# Patient Record
Sex: Male | Born: 1945
Health system: Southern US, Community
[De-identification: ages and names within clinical notes are randomized; demographics above are authoritative.]

## PROBLEM LIST (undated history)

## (undated) DIAGNOSIS — F411 Generalized anxiety disorder: Secondary | ICD-10-CM

## (undated) DIAGNOSIS — T7840XA Allergy, unspecified, initial encounter: Secondary | ICD-10-CM

## (undated) DIAGNOSIS — I700 Atherosclerosis of aorta: Secondary | ICD-10-CM

## (undated) DIAGNOSIS — R011 Cardiac murmur, unspecified: Secondary | ICD-10-CM

## (undated) DIAGNOSIS — G709 Myoneural disorder, unspecified: Secondary | ICD-10-CM

## (undated) DIAGNOSIS — N200 Calculus of kidney: Secondary | ICD-10-CM

## (undated) DIAGNOSIS — I493 Ventricular premature depolarization: Secondary | ICD-10-CM

## (undated) DIAGNOSIS — M109 Gout, unspecified: Secondary | ICD-10-CM

## (undated) DIAGNOSIS — F329 Major depressive disorder, single episode, unspecified: Secondary | ICD-10-CM

## (undated) DIAGNOSIS — E785 Hyperlipidemia, unspecified: Secondary | ICD-10-CM

## (undated) DIAGNOSIS — C801 Malignant (primary) neoplasm, unspecified: Secondary | ICD-10-CM

## (undated) DIAGNOSIS — E119 Type 2 diabetes mellitus without complications: Secondary | ICD-10-CM

## (undated) DIAGNOSIS — K589 Irritable bowel syndrome without diarrhea: Secondary | ICD-10-CM

## (undated) DIAGNOSIS — H269 Unspecified cataract: Secondary | ICD-10-CM

## (undated) DIAGNOSIS — K5730 Diverticulosis of large intestine without perforation or abscess without bleeding: Secondary | ICD-10-CM

## (undated) DIAGNOSIS — F3289 Other specified depressive episodes: Secondary | ICD-10-CM

## (undated) DIAGNOSIS — D649 Anemia, unspecified: Secondary | ICD-10-CM

## (undated) DIAGNOSIS — M1990 Unspecified osteoarthritis, unspecified site: Secondary | ICD-10-CM

## (undated) DIAGNOSIS — I10 Essential (primary) hypertension: Secondary | ICD-10-CM

## (undated) DIAGNOSIS — K219 Gastro-esophageal reflux disease without esophagitis: Secondary | ICD-10-CM

## (undated) DIAGNOSIS — Z8546 Personal history of malignant neoplasm of prostate: Secondary | ICD-10-CM

## (undated) HISTORY — DX: Gastro-esophageal reflux disease without esophagitis: K21.9

## (undated) HISTORY — DX: Unspecified cataract: H26.9

## (undated) HISTORY — DX: Major depressive disorder, single episode, unspecified: F32.9

## (undated) HISTORY — DX: Hyperlipidemia, unspecified: E78.5

## (undated) HISTORY — PX: TONSILLECTOMY: SUR1361

## (undated) HISTORY — DX: Irritable bowel syndrome, unspecified: K58.9

## (undated) HISTORY — DX: Other specified depressive episodes: F32.89

## (undated) HISTORY — DX: Calculus of kidney: N20.0

## (undated) HISTORY — DX: Cardiac murmur, unspecified: R01.1

## (undated) HISTORY — DX: Allergy, unspecified, initial encounter: T78.40XA

## (undated) HISTORY — PX: PROSTATE SURGERY: SHX751

## (undated) HISTORY — DX: Unspecified osteoarthritis, unspecified site: M19.90

## (undated) HISTORY — DX: Type 2 diabetes mellitus without complications: E11.9

## (undated) HISTORY — DX: Myoneural disorder, unspecified: G70.9

## (undated) HISTORY — DX: Gout, unspecified: M10.9

## (undated) HISTORY — PX: COLONOSCOPY: SHX174

## (undated) HISTORY — DX: Atherosclerosis of aorta: I70.0

## (undated) HISTORY — PX: POLYPECTOMY: SHX149

## (undated) HISTORY — DX: Malignant (primary) neoplasm, unspecified: C80.1

## (undated) HISTORY — PX: OTHER SURGICAL HISTORY: SHX169

## (undated) HISTORY — DX: Generalized anxiety disorder: F41.1

## (undated) HISTORY — DX: Diverticulosis of large intestine without perforation or abscess without bleeding: K57.30

## (undated) HISTORY — DX: Anemia, unspecified: D64.9

## (undated) HISTORY — DX: Personal history of malignant neoplasm of prostate: Z85.46

## (undated) HISTORY — DX: Ventricular premature depolarization: I49.3

## (undated) HISTORY — DX: Essential (primary) hypertension: I10

## (undated) HISTORY — PX: HERNIA REPAIR: SHX51

---

## 2001-06-05 ENCOUNTER — Emergency Department (HOSPITAL_COMMUNITY): Admission: EM | Admit: 2001-06-05 | Discharge: 2001-06-05 | Payer: Self-pay | Admitting: Emergency Medicine

## 2001-06-05 ENCOUNTER — Encounter: Payer: Self-pay | Admitting: Emergency Medicine

## 2001-06-12 ENCOUNTER — Ambulatory Visit (HOSPITAL_BASED_OUTPATIENT_CLINIC_OR_DEPARTMENT_OTHER): Admission: RE | Admit: 2001-06-12 | Discharge: 2001-06-12 | Payer: Self-pay | Admitting: Urology

## 2001-07-10 ENCOUNTER — Encounter: Payer: Self-pay | Admitting: Urology

## 2001-07-10 ENCOUNTER — Encounter: Admission: RE | Admit: 2001-07-10 | Discharge: 2001-07-10 | Payer: Self-pay | Admitting: Urology

## 2001-08-14 ENCOUNTER — Encounter: Payer: Self-pay | Admitting: Urology

## 2001-08-14 ENCOUNTER — Encounter: Admission: RE | Admit: 2001-08-14 | Discharge: 2001-08-14 | Payer: Self-pay | Admitting: Urology

## 2002-12-12 ENCOUNTER — Encounter: Payer: Self-pay | Admitting: Gastroenterology

## 2003-10-31 ENCOUNTER — Inpatient Hospital Stay (HOSPITAL_COMMUNITY): Admission: RE | Admit: 2003-10-31 | Discharge: 2003-11-03 | Payer: Self-pay | Admitting: Urology

## 2003-10-31 ENCOUNTER — Encounter (INDEPENDENT_AMBULATORY_CARE_PROVIDER_SITE_OTHER): Payer: Self-pay | Admitting: Specialist

## 2008-01-16 ENCOUNTER — Telehealth: Payer: Self-pay | Admitting: Gastroenterology

## 2008-02-06 ENCOUNTER — Encounter: Payer: Self-pay | Admitting: Gastroenterology

## 2008-02-13 ENCOUNTER — Ambulatory Visit: Payer: Self-pay | Admitting: Gastroenterology

## 2008-02-21 ENCOUNTER — Encounter: Payer: Self-pay | Admitting: Gastroenterology

## 2008-02-26 ENCOUNTER — Telehealth: Payer: Self-pay | Admitting: Gastroenterology

## 2008-03-27 ENCOUNTER — Encounter: Payer: Self-pay | Admitting: Gastroenterology

## 2008-03-28 ENCOUNTER — Telehealth (INDEPENDENT_AMBULATORY_CARE_PROVIDER_SITE_OTHER): Payer: Self-pay | Admitting: *Deleted

## 2008-04-25 DIAGNOSIS — M109 Gout, unspecified: Secondary | ICD-10-CM | POA: Insufficient documentation

## 2008-04-25 DIAGNOSIS — E119 Type 2 diabetes mellitus without complications: Secondary | ICD-10-CM | POA: Insufficient documentation

## 2008-04-25 DIAGNOSIS — N200 Calculus of kidney: Secondary | ICD-10-CM | POA: Insufficient documentation

## 2008-04-25 DIAGNOSIS — K5730 Diverticulosis of large intestine without perforation or abscess without bleeding: Secondary | ICD-10-CM | POA: Insufficient documentation

## 2008-04-25 DIAGNOSIS — Z8546 Personal history of malignant neoplasm of prostate: Secondary | ICD-10-CM | POA: Insufficient documentation

## 2008-04-25 DIAGNOSIS — F329 Major depressive disorder, single episode, unspecified: Secondary | ICD-10-CM | POA: Insufficient documentation

## 2008-04-25 DIAGNOSIS — F411 Generalized anxiety disorder: Secondary | ICD-10-CM

## 2008-04-25 DIAGNOSIS — K589 Irritable bowel syndrome without diarrhea: Secondary | ICD-10-CM | POA: Insufficient documentation

## 2008-04-30 ENCOUNTER — Ambulatory Visit: Payer: Self-pay | Admitting: Gastroenterology

## 2008-04-30 LAB — CONVERTED CEMR LAB
BUN: 20 mg/dL (ref 6–23)
CO2: 34 meq/L — ABNORMAL HIGH (ref 19–32)
Calcium: 9.6 mg/dL (ref 8.4–10.5)
Chloride: 102 meq/L (ref 96–112)
Creatinine, Ser: 0.9 mg/dL (ref 0.4–1.5)
Ferritin: 37 ng/mL (ref 22.0–322.0)
Folate: >20 ng/mL
GFR calc Af Amer: 109 mL/min
GFR calc non Af Amer: 90 mL/min
Glucose, Bld: 91 mg/dL (ref 70–99)
Iron: 75 ug/dL (ref 42–165)
Potassium: 4.6 meq/L (ref 3.5–5.1)
Saturation Ratios: 20.7 % (ref 20.0–50.0)
Sodium: 141 meq/L (ref 135–145)
Transferrin: 258.8 mg/dL (ref >=212.0)
Vitamin B-12: 474 pg/mL (ref 211–911)

## 2008-05-01 ENCOUNTER — Ambulatory Visit: Payer: Self-pay | Admitting: Gastroenterology

## 2008-06-04 ENCOUNTER — Encounter: Payer: Self-pay | Admitting: Gastroenterology

## 2008-06-27 ENCOUNTER — Ambulatory Visit: Payer: Self-pay | Admitting: Gastroenterology

## 2008-06-27 DIAGNOSIS — D509 Iron deficiency anemia, unspecified: Secondary | ICD-10-CM | POA: Insufficient documentation

## 2008-06-27 LAB — CONVERTED CEMR LAB
Basophils Absolute: 0 10*3/uL (ref 0.0–0.1)
Basophils Relative: 0.1 % (ref 0.0–3.0)
Eosinophils Absolute: 0.1 10*3/uL (ref 0.0–0.7)
Eosinophils Relative: 1.5 % (ref 0.0–5.0)
Ferritin: 45.8 ng/mL (ref 22.0–322.0)
HCT: 36.5 % — ABNORMAL LOW (ref 39.0–52.0)
Hemoglobin: 12.8 g/dL — ABNORMAL LOW (ref 13.0–17.0)
INR: 1 (ref 0.8–1.0)
Iron: 90 ug/dL (ref 42–165)
Lymphocytes Relative: 17.1 % (ref 12.0–46.0)
MCHC: 35 g/dL (ref 30.0–36.0)
MCV: 97.7 fL (ref 78.0–100.0)
Monocytes Absolute: 0.4 10*3/uL (ref 0.1–1.0)
Monocytes Relative: 7.3 % (ref 3.0–12.0)
Neutro Abs: 3.6 10*3/uL (ref 1.4–7.7)
Neutrophils Relative %: 74 % (ref 43.0–77.0)
Platelets: 200 10*3/uL (ref 150–400)
Prothrombin Time: 11 s (ref 10.9–13.3)
RBC: 3.74 M/uL — ABNORMAL LOW (ref 4.22–5.81)
RDW: 12.5 % (ref 11.5–14.6)
Sed Rate: 18 mm/hr — ABNORMAL HIGH (ref 0–16)
Tissue Transglutaminase Ab, IgA: 0.3 units (ref <7)
WBC: 5 10*3/uL (ref 4.5–10.5)

## 2008-07-05 ENCOUNTER — Encounter: Payer: Self-pay | Admitting: Gastroenterology

## 2008-07-05 ENCOUNTER — Ambulatory Visit: Payer: Self-pay | Admitting: Gastroenterology

## 2008-07-05 DIAGNOSIS — K2900 Acute gastritis without bleeding: Secondary | ICD-10-CM | POA: Insufficient documentation

## 2008-07-08 ENCOUNTER — Telehealth: Payer: Self-pay | Admitting: Gastroenterology

## 2008-07-08 ENCOUNTER — Encounter: Payer: Self-pay | Admitting: Gastroenterology

## 2008-08-06 ENCOUNTER — Ambulatory Visit: Payer: Self-pay | Admitting: Gastroenterology

## 2008-08-06 ENCOUNTER — Encounter (INDEPENDENT_AMBULATORY_CARE_PROVIDER_SITE_OTHER): Payer: Self-pay | Admitting: *Deleted

## 2008-08-13 ENCOUNTER — Ambulatory Visit: Payer: Self-pay | Admitting: Gastroenterology

## 2008-08-14 DIAGNOSIS — K921 Melena: Secondary | ICD-10-CM | POA: Insufficient documentation

## 2008-08-14 LAB — CONVERTED CEMR LAB: Fecal Occult Bld: POSITIVE — AB

## 2008-08-22 ENCOUNTER — Telehealth: Payer: Self-pay | Admitting: Gastroenterology

## 2008-08-29 ENCOUNTER — Encounter: Payer: Self-pay | Admitting: Gastroenterology

## 2008-08-29 ENCOUNTER — Encounter: Payer: Self-pay | Admitting: Internal Medicine

## 2008-08-29 ENCOUNTER — Ambulatory Visit: Payer: Self-pay | Admitting: Gastroenterology

## 2008-10-09 ENCOUNTER — Encounter: Payer: Self-pay | Admitting: Gastroenterology

## 2008-10-28 ENCOUNTER — Telehealth: Payer: Self-pay | Admitting: Gastroenterology

## 2008-11-07 ENCOUNTER — Ambulatory Visit: Payer: Self-pay | Admitting: Gastroenterology

## 2008-11-07 LAB — CONVERTED CEMR LAB
Basophils Relative: 1 % (ref 0.0–3.0)
Eosinophils Relative: 3.1 % (ref 0.0–5.0)
HCT: 34.9 % — ABNORMAL LOW (ref 39.0–52.0)
Hemoglobin: 12.1 g/dL — ABNORMAL LOW (ref 13.0–17.0)
Lymphs Abs: 1.2 10*3/uL (ref 0.7–4.0)
MCV: 98.8 fL (ref 78.0–100.0)
Monocytes Absolute: 0.5 10*3/uL (ref 0.1–1.0)
Monocytes Relative: 11.3 % (ref 3.0–12.0)
RBC: 3.53 M/uL — ABNORMAL LOW (ref 4.22–5.81)
WBC: 4.7 10*3/uL (ref 4.5–10.5)

## 2009-01-15 ENCOUNTER — Encounter: Payer: Self-pay | Admitting: Gastroenterology

## 2009-03-06 ENCOUNTER — Ambulatory Visit: Payer: Self-pay | Admitting: Gastroenterology

## 2009-03-27 ENCOUNTER — Telehealth: Payer: Self-pay | Admitting: Gastroenterology

## 2009-03-28 ENCOUNTER — Ambulatory Visit: Payer: Self-pay | Admitting: Gastroenterology

## 2009-04-10 ENCOUNTER — Telehealth (INDEPENDENT_AMBULATORY_CARE_PROVIDER_SITE_OTHER): Payer: Self-pay | Admitting: *Deleted

## 2009-08-01 ENCOUNTER — Encounter: Admission: RE | Admit: 2009-08-01 | Discharge: 2009-08-01 | Payer: Self-pay | Admitting: Family Medicine

## 2009-11-14 ENCOUNTER — Telehealth: Payer: Self-pay | Admitting: Gastroenterology

## 2010-03-27 ENCOUNTER — Encounter: Payer: Self-pay | Admitting: Gastroenterology

## 2010-05-06 ENCOUNTER — Encounter: Payer: Self-pay | Admitting: Gastroenterology

## 2010-07-27 ENCOUNTER — Encounter: Payer: Self-pay | Admitting: Gastroenterology

## 2010-08-18 NOTE — Progress Notes (Signed)
Summary: Refill request  Phone Note From Pharmacy   Caller: [facility] Summary of Call: Rx refill requested for Dualvit. Initial call taken by: Ashok Cordia RN,  November 14, 2009 4:36 PM    Prescriptions: RE DUALVIT PLUS 162-115.2-1 MG CAPS (FEFUM-FEPO-FA-B CMP-C-ZN-MN-CU) Take 1 tablet dialy  #30 x 6   Entered by:   Ashok Cordia RN   Authorized by:   Mardella Layman MD [facility]   Signed by:   Ashok Cordia RN on 11/14/2009   Method used:   Electronically to        [facility] (retail)       [address]       [place], [place]  [address]       Ph: [phone]       Fax: [phone]   RxID:   [id number]

## 2010-11-30 ENCOUNTER — Encounter: Payer: Self-pay | Admitting: Gastroenterology

## 2010-12-04 NOTE — Op Note (Signed)
[place]  Patient:    George Wang, George Wang Visit Number: [id number] MRN: [id number]          Service Type: NES Location: NESC Attending Physician:  Laqueta Jean Dictated by:   Vonzell Schlatter Patsi Sears, M.D. Proc. Date: 06/12/01 Admit Date:  06/12/2001                             Operative Report  PREOPERATIVE DIAGNOSIS:  Left lower ureteral calculus.  POSTOPERATIVE DIAGNOSIS:  Past left lower ureteral calculus.  OPERATION:  Cystourethroscopy, left retrograde pyelogram, left balloon dilation of the lower ureter and left ureteroscopy.  SURGEON:  Sigmund I. Patsi Sears, M.D.  ANESTHESIA:  General (LMA).  DESCRIPTION OF PROCEDURE:  After appropriate preanesthesia, the patient was brought to the operating room and placed on the operating room table in the dorsal supine position where general anesthesia was introduced.  He was then replaced in the dorsal lithotomy position.  The pubis was prepped with Betadine solution and draped in the usual fashion.  Review of the history showed that this patient had a 3 mm left lower ureteral vesicle junction stone by CT scan last week.  He had pain on Saturday night and now presents for a cystoscopy and manipulation of the stone.  DESCRIPTION OF PROCEDURE:  Cystoscopy was accomplished which shows a normal-appearing bladder and edematous orifice.  The open ended catheter could not be passed, and a guidewire was passed in.  Then the open ended catheter was passed and retrograde pyelogram reveals grossly edematous left ureteral orifice.  No stone was identified.  Ureteroscopy was accomplished all the way to the level of the kidney but no stone could be identified.  Multiple attempts a __________ a stone basket was accomplished, but no stone was identified.  Therefore, Xylocaine jelly was left in the ureter, and Xylocaine left in the urethra.  The patient was given IV Toradol, and was given a  B&O suppository at the end of the case.  The patient was awakened and taken to the recovery room in good condition. Dictated by:   Vonzell Schlatter Patsi Sears, M.D. Attending Physician:  Laqueta Jean DD:  06/12/01 TD:  06/12/01 Job: [id number]

## 2010-12-04 NOTE — Discharge Summary (Signed)
NAME:  George Wang, George Wang NO.:  [id number]   MEDICAL RECORD NO.:  [id number]                   PATIENT TYPE:  INP   LOCATION:  0377                                 FACILITY:  [facility]   PHYSICIAN:  Excell Seltzer. Annabell Howells, M.D.                 DATE OF BIRTH:  March 20, 1946   DATE OF ADMISSION:  10/31/2003  DATE OF DISCHARGE:  11/03/2003                                 DISCHARGE SUMMARY   ADMISSION DIAGNOSIS:  Prostate cancer.   PROCEDURE:  Radical retropubic prostatectomy with bilateral pelvic lymph  node dissection performed November 01, 2003.   DISCHARGE MEDICATIONS:  Vicodin, Levaquin.   DISCHARGE DIET:  Regular.   ACTIVITY LEVEL:  The patient should avoid bending, stooping, lifting, or  performing other strenuous activities.   FOLLOWUP:  The patient is to return to see Dr. Annabell Howells in one week.   HISTORY OF PRESENT ILLNESS:  The patient is a 65 year old white male who was  found to have a prostatic nodule with a PSA of 0.7. The patient underwent a  biopsy which revealed Gleason's 6 carcinoma of the prostate and he was  elected to undergo a radical retropubic prostatectomy.   HOSPITAL COURSE:  [name] was admitted to [facility] on October 31, 2003, was taken to the operating room at  which time he underwent a radical retropubic prostatectomy with bilateral  pelvic lymph node dissection. The patient tolerated the procedure well and  postoperatively transferred to the PACU in stable condition.  For a detailed  description of the operating please see the type operative note on the  chart. On postoperative day #1, the patient remained afebrile and relatively  stable with clear urine coming from his Foley catheter. The patient's  hemoglobin was 9.4 on postoperative day one, which was down from 13.5  preoperatively, but the patient was stable. The patient's diet was  subsequently advanced and he was encouraged to ambulate in the  hallway. On  postoperative day two, the patient remained hemodynamically stable and a  followup hemoglobin was 9.2. The patient was otherwise doing well and was  monitored for another 24 hours, and on postoperative day three was ready for  discharge. The patient's JP drain was subsequently discharged home with  Foley catheter to light drainage. He is to follow up with Dr. Annabell Howells in  approximately one week.     Bailey Mech, MD                        Excell Seltzer. Annabell Howells, M.D.    JP/MEDQ  D:  12/03/2003  T:  12/03/2003  Job:  [id number]

## 2010-12-04 NOTE — Op Note (Signed)
NAME:  George Wang, George Wang NO.:  [id number]   MEDICAL RECORD NO.:  [id number]                   PATIENT TYPE:  INP   LOCATION:  0377                                 FACILITY[facility]  [facility]   PHYSICIAN:  Excell Seltzer. Annabell Howells, M.D.                 DATE OF BIRTH:  1945-12-22   DATE OF PROCEDURE:  11/01/2003  DATE OF DISCHARGE:                                 OPERATIVE REPORT   PREOPERATIVE DIAGNOSES:  Prostate cancer.   POSTOPERATIVE DIAGNOSES:  Prostate cancer.   PROCEDURE:  Nerve sparing radical retropubic prostatectomy with pelvic  lymphadenectomy.   SURGEON:  Margie Brink. Annabell Howells, M.D.   ASSISTANT:  Thyra Breed, M.D.   ANESTHESIA:  General endotracheal.   DRAINS:  22 French Foley catheter to straight drain and a 10 [place] Blake  drain to bulb suction.   ESTIMATED BLOOD LOSS:  400 mL   COMPLICATIONS:  None.   INDICATIONS FOR PROCEDURE:  Mr. Reidel is a 65 year old male who  originally presented with normal PSA value of 0.7 but a palpable prostate  nodule found during a workup for hematuria.  Subsequently the patient  underwent a transrectal ultrasound of the prostate, biopsy which revealed a  Gleason 3+3=6 focus of prostate adenocarcinoma on the left needle biopsy.  Of note, the patient's nodule was on the right.  His clinical stage is T2A.  Several modes of therapy for following and treating his prostate cancer were  discussed. In particular, watchful waiting as well as radical prostatectomy,  radiation therapy including external beam and seed implantation were  discussed in great detail.  The patient has decided to proceed with radical  retropubic prostatectomy.  The risks, benefits, and alternatives to this  procedure have been discussed with the patient and he is willing to proceed.  Of additional note, the patient did undergo a Cardiolite study prior to  surgical consideration which showed no evidence of ischemia.  Informed  consent has been obtained to  undergo radical retropubic prostatectomy with  bilateral pelvic lymphadenectomy.   DESCRIPTION OF PROCEDURE:  Following identification by his arm bracelet, the  patient was brought to the operating room and placed in the supine position.  A small bump was then placed in the patient's lumbar region.  He then  underwent general endotracheal anesthesia and was given preoperative IV  antibiotics.  The lower abdomen was then shaved and prepped with Betadine  solution and draped in the usual sterile fashion. A 20 French Foley catheter  was then inserted into the bladder and the bladder was drained in its  entirety.   A lower midline incision was then made with the scalpel from the pubis to  just below the umbilicus. The scalpel was then used to carry the incision  down through the subcutaneous tissues as well as the anterior rectus fascia.  Any subcutaneous bleeding was then controlled with Bovie electrocautery.  The rectus  muscle was then identified and parted in the midline. The  transversalis fascia was opened and blunt dissection was used to expose both  the right and the left pelvic fossa.  The right pelvic fossa was then  exposed with malleable retractors using the Bookwalter self retaining unit  and dissection of the right sided lymph node packed was initiated over the  iliac vein.  During the dissection, there was no evidence of gross nodal  disease. The lymph node dissection was then completed with the limits of the  dissection including the external iliac vein, the obturator nerve, the  circumflex iliac vein and the bifurcation of the iliac artery.  Large Hem-o-  Lok  clips were then used to control any vascular and lymphatic channels.  The obturator nerve was kept in plain site and protected throughout the  dissection.  Again no gross obvious nodal disease was noted.  A small pack  was then placed within the right pelvic fossa to later be removed. We then  reset the malleable  retractor and turned our attention to the left node  dissection which was performed in an identical fashion without event. Again  large clips were used for control of any lymphatic and vascular channels.  The inferior portion of both node packets was controlled with a 2-0 silk  tie.  Once the node dissections were complete, we began our dissection of  the prostate.  The retractor blades were then readjusted. The endopelvic  fascia was identified and punctured on the right. Blunt dissection was then  used to free the lateral aspect of the prostate.  Any remaining endopelvic  fascia was then released using a right angled clamp and Bovie  electrocautery.  Care was taken to remain far away from the neurovascular  bundles.  This procedure was then repeated on the left side.  The fascia was  then incised superiorly as it reflected over the prostate.  The fat  overlying the dorsal venous complex was then teased away with a Kitner and  sucker dissection to narrow the area.  The puboprostatic ligaments were then  taken down at their extreme lateral attachments to the pubis.  The majority  within the midline were left intact.  The Hohenfelner clamp was then passed  beneath the dorsal venous complex.  A #1 Vicryl tie was passed and tied  around the dorsal venous complex.  An Allis clamp was then used to grasp the  edges of the endopelvic fascia and approximate them over the prostate.  A 2-  0 Vicryl suture on a UR-5 needle was then used in a figure-of-eight fashion  to deeply control any potential back bleeding vessels. We then divided the  dorsal venous complex exposing the apex of the prostate and the urethra.  A  Vanderbilt was then used to carefully dissect the neurovascular bundles from  the urethra laterally on each side.  Metzenbaum scissors were then used to  incise the anterior urethra and expose the Foley catheter below.  A right  angled clamp was then passed beneath the Foley catheter. The  Hohenfelner clamp was then again passed below the urethra and a moistened umbilical tape  passed to hold this orientation.  The Foley catheter was then lubricated and  a right angled clamp was used to deliver the Foley catheter and pull the  catheter into the wound.  The catheter was then used to provide cephalad  traction on the prostate for further dissection.  We then divided the  posterior urethra and all rectourethralis attachments were taken down  bluntly.  The prostate was then carefully dissected from the rectum bluntly.  This allowed excellent exposure of the lateral pedicles. These were then  taken down in successive fashion using right angled dissection and large  right angled Hem-o-Lok  clips.  Once the prostate had been sufficiently  elevated, the anterior leaf of Denonvillier's fascia was incised overlying  the seminal vesicles.  Metzenbaum scissors were then used to carefully  dissect out the seminal vesicles and ampulla of the vas.  Initially both  ampulla of the vas were ligated using large Hem-o-Lok  clips and then  transected.  The seminal vesicles were then ligated using large clips and  transected.   We then turned our attention anteriorly where the bladder neck was grasped  between two Allis clamps. Using Bovie electrocautery and a tonsil clamp, the  bladder neck fibers were then carefully dissected out.  Once the anterior  bladder neck was divided, the Foley catheter balloon was deflated and the  Foley catheter was brought from the bladder and used to provide traction on  the prostate.  Both ureteral orifices were easily identified and well away  from the bladder neck and they were seen to be effluxing blue urine from an  earlier administration of indigo carmine.  The posterior bladder neck was  then divided along with any remaining posterior prostatic attachments.  The  prostatic specimen was then removed from the operative field and sent for  pathological  analysis.  There appeared to be no gross disease extension in  the specimen.  Initial inspection of the bladder neck showed an already  continent appearing bladder neck which would need minimal reconstruction.  The bladder neck mucosa was then everted anteriorly using interrupted 4-0  chromic suture.  Beneath the bladder neck, the overlying tissue was  reapproximated around the bladder using two interrupted 2-0 chromic sutures.  The bladder neck was then seen to be large enough to admit the tip of the  fifth finger of the surgeon.  The wound was then copiously irrigated and any  active bleeding was controlled using Bovie electrocautery and small Hem-o-  Lok  clips.  At this point, the [facility] device was passed per urethra and  seen to exit a well formed urethral stump. We then placed our anastomotic  sutures using 2-0 Vicryl from outside to in on the urethral stump at the 2,  5, 7 and 10 o'clock locations.  These sutures were then placed in a similar fashion to their corresponding areas on the bladder neck from inside to out.  A new Foley catheter was then placed through the bladder neck and guided  into the bladder with DeBakey forceps.  The balloon was then inflated with  15 mL of sterile fluid. We then placed the 12 o'clock anastomotic suture  from the urethral stump to the 12 o'clock location of the bladder neck.   The wound was again copiously irrigated and some oozing was noted in both  areas of the right and left pelvic fossa. Surgicel was used to pack these  areas and the malleable retractor removed. The bladder neck was then  securely positioned against the urethral stump removing all slack from the  anastomotic sutures.  The sutures were then tied and trimmed. The bladder  was irrigated and urine was found to be pink tinged. The anastomosis  appeared to be water tight.  We then placed a #10 flat fully fluted Harrison Mons  drain through a separate stab wound lateral to the left side of  the  abdominal incision.  This was positioned over the anastomotic area in the  right and left pelvic fossa.  The fascia was then closed with a running #1  PDS. The subcutaneous tissues were irrigated with sterile saline and the  skin was closed with surgical clips.  The Foley catheter was irrigated once  again and no bleeding was noted.  The catheter was placed to straight  drainage and the JP drain to bulb suction. All sponge, needle and instrument  counts were correct x2.  The patient tolerated the procedure well and there  were no complications.  Please note that Dr. Annabell Howells was present and  participated in the entire procedure as he was the responsible surgeon   DISPOSITION:  After awaking from general anesthesia, the patient was  transported to the post anesthesia care unit in stable condition.  From here  he will be transferred to the floor for further postoperative management.     Thyra Breed, MD                            Excell Seltzer. Annabell Howells, M.D.    EG/MEDQ  D:  11/01/2003  T:  11/01/2003  Job:  [id number]

## 2010-12-04 NOTE — H&P (Signed)
NAME:  George Wang, George Wang NO.:  [id number]   MEDICAL RECORD NO.:  [id number]                   PATIENT TYPE:  INP   LOCATION:  0377                                 FACILITY:  [facility]   PHYSICIAN:  Excell Seltzer. Annabell Howells, M.D.                 DATE OF BIRTH:  1945-11-18   DATE OF ADMISSION:  10/31/2003  DATE OF DISCHARGE:  11/03/2003                                HISTORY & PHYSICAL   CHIEF COMPLAINT:  Prostate cancer.   HISTORY:  George Wang is a 65 year old white male, who was found to have a  prostate nodule with a PSA of 0.7.  He underwent a biopsy which revealed a  Gleason 6 adenocarcinoma of the prostate, and he has elected radical  prostatectomy for therapy and is admitted for that procedure.   PAST MEDICAL HISTORY:  No drug allergies.   ADMISSION MEDICATIONS:  1. Lotrel 10/20 daily.  2. Diovan 320 mg b.i.d.  3. Zocor 40 mg nightly.  4. Metformin 500 mg 2 tabs daily.  5. Aspirin 81 mg daily.  6. Citrucel.  7. Amaryl 2 mg 2 tabs daily.  8. Hydrochlorothiazide 25 mg daily.  9. Avandia 8 mg 1/2 tab b.i.d.  10.      Niaspan 1 g daily.  11.      Folbee 2 tabs daily.  12.      Betacarotene.  13.      Zinc.  14.      Selenium.  15.      Omega fish oil.  16.      Centrum Silver.  17.      Meridia 10 mg daily.   MEDICAL HISTORY:  1. Diabetes mellitus.  2. Hypertension.  3. Dyslipidemia.  4. History of stones.  5. History of left herniorrhaphy.  6. Colon polyps with prior colonoscopy.   SOCIAL HISTORY:  No tobacco since 1980.  There is occasional alcohol.  Continues to work as a Psychologist, forensic.   FAMILY HISTORY:  Pertinent for heart disease.   REVIEW OF SYSTEMS:  He is otherwise without complaints.   PHYSICAL EXAMINATION:  GENERAL:  He is a well-developed, well-nourished  white male in no acute distress.  VITAL SIGNS:  Blood pressure 126/64, pulse 74, weight 202.  HEENT:  Head is normocephalic, atraumatic.  NECK:  Supple.  LUNGS:   Clear, normal __________.  HEART:  Regular rate and rhythm.  ABDOMEN:  Soft, moderately obese, nontender without masses,  hepatosplenomegaly, or CVA tenderness.  No hernias or inguinal adenopathy  are noted.  GU:  Unremarkable phallus with an adequate meatus.  Scrotum is unremarkable.  Testicles bilaterally descended, normal in size and consistency without mass  or tenderness.  Epididymis unremarkable.  Anus and perineum without lesions.  RECTAL:  Normal sphincter tone.  Prostate is 2+ in size with a small right-  sided, gritty nodule at the base.  Seminal vesicles  nonpalpable.  No rectal  masses are noted.   IMPRESSION:  Gleason 6, T1C/T2A adenocarcinoma of the prostate with  secondary diagnoses of hypertension, dyslipidemia, and diabetes mellitus.   PLAN:  He is admitted for a radical prostatectomy.                                               Excell Seltzer. Annabell Howells, M.D.    JJW/MEDQ  D:  11/12/2003  T:  11/12/2003  Job:  [id number]   cc:   Ernestina Penna, M.D.  [address] [place]  [place] [address]  Fax: [phone]

## 2011-01-27 ENCOUNTER — Other Ambulatory Visit: Payer: Self-pay

## 2011-04-22 LAB — GLUCOSE, CAPILLARY: Glucose-Capillary: 103 mg/dL — ABNORMAL HIGH (ref 70–99)

## 2011-09-01 DIAGNOSIS — E559 Vitamin D deficiency, unspecified: Secondary | ICD-10-CM | POA: Diagnosis not present

## 2011-09-01 DIAGNOSIS — I10 Essential (primary) hypertension: Secondary | ICD-10-CM | POA: Diagnosis not present

## 2011-09-01 DIAGNOSIS — E785 Hyperlipidemia, unspecified: Secondary | ICD-10-CM | POA: Diagnosis not present

## 2011-09-02 ENCOUNTER — Other Ambulatory Visit: Payer: Self-pay | Admitting: Family Medicine

## 2011-09-02 DIAGNOSIS — M47812 Spondylosis without myelopathy or radiculopathy, cervical region: Secondary | ICD-10-CM

## 2011-09-03 ENCOUNTER — Other Ambulatory Visit: Payer: Self-pay

## 2011-09-07 DIAGNOSIS — R7989 Other specified abnormal findings of blood chemistry: Secondary | ICD-10-CM | POA: Diagnosis not present

## 2011-09-10 ENCOUNTER — Ambulatory Visit
Admission: RE | Admit: 2011-09-10 | Discharge: 2011-09-10 | Disposition: A | Discharge status: Home / Self Care | Payer: Medicare Other | Source: Ambulatory Visit | Attending: Family Medicine | Admitting: Family Medicine

## 2011-09-10 ENCOUNTER — Other Ambulatory Visit: Payer: Self-pay

## 2011-09-10 DIAGNOSIS — M5030 Other cervical disc degeneration, unspecified cervical region: Secondary | ICD-10-CM | POA: Diagnosis not present

## 2011-09-10 DIAGNOSIS — M47812 Spondylosis without myelopathy or radiculopathy, cervical region: Secondary | ICD-10-CM | POA: Diagnosis not present

## 2011-09-10 DIAGNOSIS — M5020 Other cervical disc displacement, unspecified cervical region: Secondary | ICD-10-CM | POA: Diagnosis not present

## 2011-09-13 ENCOUNTER — Encounter: Payer: Self-pay | Admitting: Cardiology

## 2011-09-20 DIAGNOSIS — Z1212 Encounter for screening for malignant neoplasm of rectum: Secondary | ICD-10-CM | POA: Diagnosis not present

## 2011-09-22 ENCOUNTER — Encounter: Payer: Self-pay | Admitting: Cardiology

## 2011-10-07 DIAGNOSIS — R209 Unspecified disturbances of skin sensation: Secondary | ICD-10-CM | POA: Diagnosis not present

## 2011-10-07 DIAGNOSIS — G5600 Carpal tunnel syndrome, unspecified upper limb: Secondary | ICD-10-CM | POA: Diagnosis not present

## 2011-10-12 ENCOUNTER — Telehealth: Payer: Self-pay | Admitting: Cardiology

## 2011-10-13 ENCOUNTER — Encounter: Payer: Self-pay | Admitting: Cardiology

## 2011-10-13 ENCOUNTER — Ambulatory Visit (INDEPENDENT_AMBULATORY_CARE_PROVIDER_SITE_OTHER): Payer: Medicare Other | Admitting: Cardiology

## 2011-10-13 VITALS — BP 160/70 | HR 74 | Ht 65.0 in | Wt 208.0 lb

## 2011-10-13 DIAGNOSIS — I4949 Other premature depolarization: Secondary | ICD-10-CM | POA: Diagnosis not present

## 2011-10-13 DIAGNOSIS — I10 Essential (primary) hypertension: Secondary | ICD-10-CM | POA: Insufficient documentation

## 2011-10-13 DIAGNOSIS — I493 Ventricular premature depolarization: Secondary | ICD-10-CM

## 2011-10-13 DIAGNOSIS — E663 Overweight: Secondary | ICD-10-CM

## 2011-10-13 NOTE — Assessment & Plan Note (Signed)
The patient understands the need to lose weight with diet and exercise. We have discussed specific strategies for this.  

## 2011-10-13 NOTE — Patient Instructions (Addendum)
The current medical regimen is effective;  continue present plan and medications.  Your physician has requested that you have an echocardiogram. Echocardiography is a painless test that uses sound waves to create images of your heart. It provides your doctor with information about the size and shape of your heart and how well your heart's chambers and valves are working. This procedure takes approximately one hour. There are no restrictions for this procedure.  Follow up will be based on results.

## 2011-10-13 NOTE — Assessment & Plan Note (Signed)
I will check an echocardiogram. Provided this is normal no further evaluation will be directed. I would suggest thyroid studies with his next lab work. He denied discussed symptomatic treatment of these. Right now they are not particularly bothersome. Therefore, we will not pursue a change in therapy. If they become worse we will consider this.

## 2011-10-13 NOTE — Progress Notes (Signed)
HPI The patient presents for evaluation of palpitations. He has no past cardiac history. He reports a negative stress perfusion study several years ago. He's had a negative exercise treadmill test in the past. He has had PVCs. I reviewed her recent event monitor demonstrating this. He feels these palpitations. He feels them particularly at night when he is lying down. He does not describe associated presyncope or syncope. He does not describe chest pressure, neck or arm discomfort. He does not have significant shortness of breath, PND or orthopnea. He cannot bring along and he actually thinks they're much less when they were recently.  No Known Allergies  Current Outpatient Prescriptions  Medication Sig Dispense Refill  . amLODipine-benazepril (LOTREL) 10-20 MG per capsule Take 1 capsule by mouth daily.      Marland Kitchen atorvastatin (LIPITOR) 40 MG tablet Take 40 mg by mouth daily.      . benazepril (LOTENSIN) 20 MG tablet Take 20 mg by mouth daily.      . beta carotene 78295 UNIT capsule Take 25,000 Units by mouth daily.      . cholecalciferol (VITAMIN D) 400 UNITS TABS Take by mouth daily. 2 tab in am      . FeFum-FePo-FA-B Cmp-C-Zn-Mn-Cu (RE DUALVIT PLUS) 162-115.2-1 MG CAPS Take by mouth daily.      . fish oil-omega-3 fatty acids 1000 MG capsule Take 2 g by mouth daily.      Marland Kitchen glimepiride (AMARYL) 4 MG tablet Take 4 mg by mouth 2 (two) times daily. 1/2 tab bid      . losartan-hydrochlorothiazide (HYZAAR) 100-25 MG per tablet Take 1 tablet by mouth daily.      . metFORMIN (GLUCOPHAGE) 500 MG tablet Take 500 mg by mouth 2 (two) times daily with a meal.      . Multiple Vitamins-Minerals (CENTRUM SILVER PO) Take by mouth.      Marland Kitchen omeprazole (PRILOSEC) 20 MG capsule Take 20 mg by mouth daily.      Marland Kitchen PARoxetine (PAXIL) 20 MG tablet Take 20 mg by mouth every morning.      . pioglitazone (ACTOS) 30 MG tablet Take 30 mg by mouth daily.        Past Medical History  Diagnosis Date  . Personal history of  malignant neoplasm of prostate   . Gout, unspecified   . Depressive disorder, not elsewhere classified   . Anxiety state, unspecified   . Calculus of kidney   . Type II or unspecified type diabetes mellitus without mention of complication, not stated as uncontrolled   . Irritable bowel syndrome   . Diverticulosis of colon (without mention of hemorrhage)     Past Surgical History  Procedure Date  . Hernia repair     left side  . Prostate surgery   . Tonsillectomy     Family History  Problem Relation Age of Onset  . Cancer Maternal Aunt     breast  . Pancreatic cancer Paternal Aunt   . Prostate cancer Paternal Uncle   . Heart disease Maternal Grandmother   . Heart disease Maternal Grandfather   . Heart disease Paternal Grandfather     History   Social History  . Marital Status: Married    Spouse Name: N/A    Number of Children: N/A  . Years of Education: N/A   Occupational History  . Not on file.   Social History Main Topics  . Smoking status: Former Smoker    Quit date: 10/13/1978  . Smokeless  tobacco: Not on file  . Alcohol Use: Yes  . Drug Use: No  . Sexually Active: Not on file   Other Topics Concern  . Not on file   Social History Narrative  . No narrative on file    ROS:  Positive for  headaches, palpitations, reflux. Otherwise as stated in the history of present illness and negative for all other systems  PHYSICAL EXAM BP 160/70  Pulse 74  Ht 5\' 5"  (1.651 m)  Wt 208 lb (94.348 kg)  BMI 34.61 kg/m2 GENERAL:  Well appearing HEENT:  Pupils equal round and reactive, fundi not visualized, oral mucosa unremarkable NECK:  No jugular venous distention, waveform within normal limits, carotid upstroke brisk and symmetric, no bruits, no thyromegaly LYMPHATICS:  No cervical, inguinal adenopathy LUNGS:  Clear to auscultation bilaterally BACK:  No CVA tenderness CHEST:  Unremarkable HEART:  PMI not displaced or sustained,S1 and S2 within normal limits, no  S3, no S4, no clicks, no rubs, no murmurs ABD:  Flat, positive bowel sounds normal in frequency in pitch, no bruits, no rebound, no guarding, no midline pulsatile mass, no hepatomegaly, no splenomegaly, obese EXT:  2 plus pulses throughout, no edema, no cyanosis no clubbing SKIN:  No rashes no nodules NEURO:  Cranial nerves II through XII grossly intact, motor grossly intact throughout PSYCH:  Cognitively intact, oriented to person place and time  EKG:  Sinus rhythm, rate 74, limb lead reversal, low voltage in limb leads, no acute ST-T wave changes.10/13/2011    ASSESSMENT AND PLAN

## 2011-10-13 NOTE — Assessment & Plan Note (Signed)
His blood pressure is elevated today. However, he is not typically. I reviewed recent readings. No change in therapy is indicated.

## 2011-10-28 ENCOUNTER — Other Ambulatory Visit: Payer: Self-pay

## 2011-10-28 ENCOUNTER — Ambulatory Visit (HOSPITAL_COMMUNITY): Payer: Medicare Other | Attending: Cardiology

## 2011-10-28 DIAGNOSIS — R002 Palpitations: Secondary | ICD-10-CM | POA: Insufficient documentation

## 2011-10-28 DIAGNOSIS — I493 Ventricular premature depolarization: Secondary | ICD-10-CM

## 2011-10-28 DIAGNOSIS — I369 Nonrheumatic tricuspid valve disorder, unspecified: Secondary | ICD-10-CM | POA: Diagnosis not present

## 2011-10-28 DIAGNOSIS — E119 Type 2 diabetes mellitus without complications: Secondary | ICD-10-CM | POA: Diagnosis not present

## 2011-10-28 DIAGNOSIS — M4712 Other spondylosis with myelopathy, cervical region: Secondary | ICD-10-CM | POA: Diagnosis not present

## 2011-11-01 ENCOUNTER — Other Ambulatory Visit: Payer: Self-pay | Admitting: Neurosurgery

## 2011-11-01 DIAGNOSIS — M545 Low back pain: Secondary | ICD-10-CM

## 2011-11-02 NOTE — Telephone Encounter (Signed)
ENCOUNTER COMPLETED 

## 2011-11-05 ENCOUNTER — Ambulatory Visit
Admission: RE | Admit: 2011-11-05 | Discharge: 2011-11-05 | Disposition: A | Discharge status: Home / Self Care | Payer: Medicare Other | Source: Ambulatory Visit | Attending: Neurosurgery | Admitting: Neurosurgery

## 2011-11-05 DIAGNOSIS — M5126 Other intervertebral disc displacement, lumbar region: Secondary | ICD-10-CM | POA: Diagnosis not present

## 2011-11-05 DIAGNOSIS — M545 Low back pain: Secondary | ICD-10-CM

## 2011-11-05 DIAGNOSIS — M47817 Spondylosis without myelopathy or radiculopathy, lumbosacral region: Secondary | ICD-10-CM | POA: Diagnosis not present

## 2011-11-05 DIAGNOSIS — M5137 Other intervertebral disc degeneration, lumbosacral region: Secondary | ICD-10-CM | POA: Diagnosis not present

## 2011-12-02 DIAGNOSIS — E559 Vitamin D deficiency, unspecified: Secondary | ICD-10-CM | POA: Diagnosis not present

## 2011-12-02 DIAGNOSIS — E785 Hyperlipidemia, unspecified: Secondary | ICD-10-CM | POA: Diagnosis not present

## 2011-12-02 DIAGNOSIS — R7989 Other specified abnormal findings of blood chemistry: Secondary | ICD-10-CM | POA: Diagnosis not present

## 2011-12-02 DIAGNOSIS — E119 Type 2 diabetes mellitus without complications: Secondary | ICD-10-CM | POA: Diagnosis not present

## 2011-12-02 DIAGNOSIS — I10 Essential (primary) hypertension: Secondary | ICD-10-CM | POA: Diagnosis not present

## 2011-12-02 DIAGNOSIS — E039 Hypothyroidism, unspecified: Secondary | ICD-10-CM | POA: Diagnosis not present

## 2011-12-06 DIAGNOSIS — Z85828 Personal history of other malignant neoplasm of skin: Secondary | ICD-10-CM | POA: Diagnosis not present

## 2011-12-06 DIAGNOSIS — L570 Actinic keratosis: Secondary | ICD-10-CM | POA: Diagnosis not present

## 2011-12-09 DIAGNOSIS — M48061 Spinal stenosis, lumbar region without neurogenic claudication: Secondary | ICD-10-CM | POA: Diagnosis not present

## 2011-12-09 DIAGNOSIS — M4802 Spinal stenosis, cervical region: Secondary | ICD-10-CM | POA: Diagnosis not present

## 2011-12-24 DIAGNOSIS — Z1212 Encounter for screening for malignant neoplasm of rectum: Secondary | ICD-10-CM | POA: Diagnosis not present

## 2012-03-01 DIAGNOSIS — E119 Type 2 diabetes mellitus without complications: Secondary | ICD-10-CM | POA: Diagnosis not present

## 2012-03-01 DIAGNOSIS — J069 Acute upper respiratory infection, unspecified: Secondary | ICD-10-CM | POA: Diagnosis not present

## 2012-03-06 DIAGNOSIS — E119 Type 2 diabetes mellitus without complications: Secondary | ICD-10-CM | POA: Diagnosis not present

## 2012-03-06 DIAGNOSIS — H52229 Regular astigmatism, unspecified eye: Secondary | ICD-10-CM | POA: Diagnosis not present

## 2012-03-06 DIAGNOSIS — H2510 Age-related nuclear cataract, unspecified eye: Secondary | ICD-10-CM | POA: Diagnosis not present

## 2012-03-06 DIAGNOSIS — H5200 Hypermetropia, unspecified eye: Secondary | ICD-10-CM | POA: Diagnosis not present

## 2012-03-16 DIAGNOSIS — E559 Vitamin D deficiency, unspecified: Secondary | ICD-10-CM | POA: Diagnosis not present

## 2012-03-16 DIAGNOSIS — E785 Hyperlipidemia, unspecified: Secondary | ICD-10-CM | POA: Diagnosis not present

## 2012-03-16 DIAGNOSIS — I10 Essential (primary) hypertension: Secondary | ICD-10-CM | POA: Diagnosis not present

## 2012-03-27 DIAGNOSIS — Z1212 Encounter for screening for malignant neoplasm of rectum: Secondary | ICD-10-CM | POA: Diagnosis not present

## 2012-04-26 DIAGNOSIS — M48061 Spinal stenosis, lumbar region without neurogenic claudication: Secondary | ICD-10-CM | POA: Diagnosis not present

## 2012-04-26 DIAGNOSIS — M5126 Other intervertebral disc displacement, lumbar region: Secondary | ICD-10-CM | POA: Diagnosis not present

## 2012-05-02 DIAGNOSIS — Z23 Encounter for immunization: Secondary | ICD-10-CM | POA: Diagnosis not present

## 2012-05-17 DIAGNOSIS — M48061 Spinal stenosis, lumbar region without neurogenic claudication: Secondary | ICD-10-CM | POA: Diagnosis not present

## 2012-05-17 DIAGNOSIS — M5106 Intervertebral disc disorders with myelopathy, lumbar region: Secondary | ICD-10-CM | POA: Diagnosis not present

## 2012-05-18 DIAGNOSIS — D649 Anemia, unspecified: Secondary | ICD-10-CM | POA: Diagnosis not present

## 2012-06-05 DIAGNOSIS — L570 Actinic keratosis: Secondary | ICD-10-CM | POA: Diagnosis not present

## 2012-06-05 DIAGNOSIS — Z85828 Personal history of other malignant neoplasm of skin: Secondary | ICD-10-CM | POA: Diagnosis not present

## 2012-06-20 DIAGNOSIS — Z8546 Personal history of malignant neoplasm of prostate: Secondary | ICD-10-CM | POA: Diagnosis not present

## 2012-06-22 DIAGNOSIS — E119 Type 2 diabetes mellitus without complications: Secondary | ICD-10-CM | POA: Diagnosis not present

## 2012-06-22 DIAGNOSIS — E291 Testicular hypofunction: Secondary | ICD-10-CM | POA: Diagnosis not present

## 2012-06-22 DIAGNOSIS — N400 Enlarged prostate without lower urinary tract symptoms: Secondary | ICD-10-CM | POA: Diagnosis not present

## 2012-06-22 DIAGNOSIS — E785 Hyperlipidemia, unspecified: Secondary | ICD-10-CM | POA: Diagnosis not present

## 2012-06-22 DIAGNOSIS — I10 Essential (primary) hypertension: Secondary | ICD-10-CM | POA: Diagnosis not present

## 2012-06-22 DIAGNOSIS — E559 Vitamin D deficiency, unspecified: Secondary | ICD-10-CM | POA: Diagnosis not present

## 2012-06-22 DIAGNOSIS — D649 Anemia, unspecified: Secondary | ICD-10-CM | POA: Diagnosis not present

## 2012-06-27 DIAGNOSIS — E291 Testicular hypofunction: Secondary | ICD-10-CM | POA: Diagnosis not present

## 2012-06-27 DIAGNOSIS — N393 Stress incontinence (female) (male): Secondary | ICD-10-CM | POA: Diagnosis not present

## 2012-06-27 DIAGNOSIS — Z8546 Personal history of malignant neoplasm of prostate: Secondary | ICD-10-CM | POA: Diagnosis not present

## 2012-06-28 DIAGNOSIS — Z8546 Personal history of malignant neoplasm of prostate: Secondary | ICD-10-CM | POA: Diagnosis not present

## 2012-06-29 DIAGNOSIS — F339 Major depressive disorder, recurrent, unspecified: Secondary | ICD-10-CM | POA: Diagnosis not present

## 2012-07-07 ENCOUNTER — Other Ambulatory Visit: Payer: Self-pay | Admitting: Family Medicine

## 2012-07-07 DIAGNOSIS — E349 Endocrine disorder, unspecified: Secondary | ICD-10-CM

## 2012-07-13 ENCOUNTER — Ambulatory Visit (HOSPITAL_COMMUNITY)
Admission: RE | Admit: 2012-07-13 | Discharge: 2012-07-13 | Disposition: A | Discharge status: Home / Self Care | Payer: Medicare Other | Source: Ambulatory Visit | Attending: Family Medicine | Admitting: Family Medicine

## 2012-07-13 DIAGNOSIS — E349 Endocrine disorder, unspecified: Secondary | ICD-10-CM

## 2012-07-13 DIAGNOSIS — G319 Degenerative disease of nervous system, unspecified: Secondary | ICD-10-CM | POA: Diagnosis not present

## 2012-07-13 MED ORDER — GADOBENATE DIMEGLUMINE 529 MG/ML IV SOLN
10.0000 mL | Freq: Once | INTRAVENOUS | Status: AC | PRN
  Administered 2012-07-13: 10 mL via INTRAVENOUS

## 2012-07-17 DIAGNOSIS — R7989 Other specified abnormal findings of blood chemistry: Secondary | ICD-10-CM | POA: Diagnosis not present

## 2012-07-31 DIAGNOSIS — F339 Major depressive disorder, recurrent, unspecified: Secondary | ICD-10-CM | POA: Diagnosis not present

## 2012-07-31 DIAGNOSIS — E291 Testicular hypofunction: Secondary | ICD-10-CM | POA: Diagnosis not present

## 2012-08-01 DIAGNOSIS — E291 Testicular hypofunction: Secondary | ICD-10-CM | POA: Insufficient documentation

## 2012-10-31 ENCOUNTER — Other Ambulatory Visit: Payer: Self-pay | Admitting: *Deleted

## 2012-10-31 MED ORDER — GLIMEPIRIDE 4 MG PO TABS: ORAL_TABLET | ORAL | Status: DC

## 2012-10-31 MED ORDER — ATORVASTATIN CALCIUM 80 MG PO TABS: 80.0000 mg | ORAL_TABLET | Freq: Every day | ORAL | Status: DC

## 2012-11-02 ENCOUNTER — Telehealth: Payer: Self-pay | Admitting: Family Medicine

## 2012-11-02 NOTE — Telephone Encounter (Signed)
Appt rescheduled

## 2012-11-13 ENCOUNTER — Ambulatory Visit: Payer: Self-pay | Admitting: Family Medicine

## 2012-11-15 ENCOUNTER — Ambulatory Visit (INDEPENDENT_AMBULATORY_CARE_PROVIDER_SITE_OTHER): Payer: Medicare Other | Admitting: Family Medicine

## 2012-11-15 ENCOUNTER — Encounter: Payer: Self-pay | Admitting: Family Medicine

## 2012-11-15 VITALS — BP 117/70 | HR 72 | Temp 97.5°F | Ht 64.0 in | Wt 209.8 lb

## 2012-11-15 DIAGNOSIS — I10 Essential (primary) hypertension: Secondary | ICD-10-CM | POA: Diagnosis not present

## 2012-11-15 DIAGNOSIS — E559 Vitamin D deficiency, unspecified: Secondary | ICD-10-CM | POA: Diagnosis not present

## 2012-11-15 DIAGNOSIS — R5381 Other malaise: Secondary | ICD-10-CM | POA: Diagnosis not present

## 2012-11-15 DIAGNOSIS — F411 Generalized anxiety disorder: Secondary | ICD-10-CM

## 2012-11-15 DIAGNOSIS — E785 Hyperlipidemia, unspecified: Secondary | ICD-10-CM

## 2012-11-15 DIAGNOSIS — E119 Type 2 diabetes mellitus without complications: Secondary | ICD-10-CM

## 2012-11-15 DIAGNOSIS — R5383 Other fatigue: Secondary | ICD-10-CM

## 2012-11-15 DIAGNOSIS — Z8546 Personal history of malignant neoplasm of prostate: Secondary | ICD-10-CM

## 2012-11-15 DIAGNOSIS — F329 Major depressive disorder, single episode, unspecified: Secondary | ICD-10-CM

## 2012-11-15 DIAGNOSIS — F3289 Other specified depressive episodes: Secondary | ICD-10-CM

## 2012-11-15 DIAGNOSIS — D509 Iron deficiency anemia, unspecified: Secondary | ICD-10-CM

## 2012-11-15 LAB — POCT CBC
HCT, POC: 36.3 % — AB (ref 43.5–53.7)
Lymph, poc: 1.4 (ref 0.6–3.4)
MCH, POC: 33.2 pg — AB (ref 27–31.2)
MCV: 97.6 fL — AB (ref 80–97)
Platelet Count, POC: 196 10*3/uL (ref 142–424)
RBC: 3.7 M/uL — AB (ref 4.69–6.13)
RDW, POC: 12.6 %
WBC: 6.2 10*3/uL (ref 4.6–10.2)

## 2012-11-15 LAB — BASIC METABOLIC PANEL
BUN: 20 mg/dL (ref 6–23)
Chloride: 104 mEq/L (ref 96–112)
Creat: 1.11 mg/dL (ref 0.50–1.35)
Glucose, Bld: 80 mg/dL (ref 70–99)

## 2012-11-15 LAB — HEPATIC FUNCTION PANEL
ALT: 14 U/L (ref 0–53)
Albumin: 3.9 g/dL (ref 3.5–5.2)
Bilirubin, Direct: 0.2 mg/dL (ref 0.0–0.3)

## 2012-11-15 NOTE — Patient Instructions (Addendum)
Fall precautions discussed Continue current meds and therapeutic lifestyle changes Monitor blood sugars and blood pressures at home

## 2012-11-15 NOTE — Progress Notes (Signed)
  Subjective:    Patient ID: George Wang, male    DOB: 03-24-1946, 67 y.o.   MRN: [id number]  HPI This patient presents for recheck of multiple medical problems. No one accompanies the patient today.  Patient Active Problem List   Diagnosis Date Noted  . Overweight 10/13/2011  . HTN (hypertension) 10/13/2011  . PVC (premature ventricular contraction) 10/13/2011  . HEMOCCULT POSITIVE STOOL 08/14/2008  . ACUTE GASTRITIS WITHOUT MENTION OF HEMORRHAGE 07/05/2008  . ANEMIA, IRON DEFICIENCY 06/27/2008  . DIABETES MELLITUS 04/25/2008  . GOUT 04/25/2008  . ANXIETY 04/25/2008  . DEPRESSION 04/25/2008  . DIVERTICULOSIS, COLON 04/25/2008  . IRRITABLE BOWEL SYNDROME 04/25/2008  . NEPHROLITHIASIS 04/25/2008  . PROSTATE CANCER, HX OF 04/25/2008    In addition ,see Ros.  The allergies, current medications, past medical history, surgical history, family and social history are reviewed.  Immunizations reviewed.  Health maintenance reviewed.  The following items are outstanding:None      Review of Systems  Constitutional: Negative.   HENT: Negative.   Eyes: Negative.   Respiratory: Positive for cough (dry hacking, worse hs).   Cardiovascular: Positive for palpitations.  Gastrointestinal: Negative.   Genitourinary: Negative.   Musculoskeletal: Positive for back pain (cervical and LBP).  Skin: Negative.   Allergic/Immunologic: Negative.   Neurological: Negative.   Psychiatric/Behavioral: Positive for sleep disturbance (occasional). The patient is nervous/anxious.        Objective:   Physical Exam BP 117/70  Pulse 72  Temp(Src) 97.5 F (36.4 C) (Oral)  Ht 5\' 4"  (1.626 m)  Wt 209 lb 12.8 oz (95.165 kg)  BMI 35.99 kg/m2  The patient appeared well nourished and normally developed, alert and oriented to time and place. Speech, behavior and judgement appear normal. Vital signs as documented.  Head exam is unremarkable. No scleral icterus or pallor noted.  Neck is  without jugular venous distension, thyromegally, or carotid bruits. Carotid upstrokes are brisk bilaterally. No cervical adenopathy. There is a left supra-clavicular murmur. Lungs are clear anteriorly and posteriorly to auscultation. Normal respiratory effort. Cardiac exam reveals regular rate and rhythm at 60 per minute. First and second heart sounds normal. No murmurs, rubs or gallops.  Abdominal exam reveals normal bowl sounds, no masses, no organomegaly and no aortic enlargement. No inguinal adenopathy. Extremities are nonedematous and both femoral and pedal pulses are normal. Skin without pallor or jaundice.  Warm and dry, without rash. Neurologic exam reveals normal deep tendon reflexes and normal sensation. Diabetic foot exam was done today          Assessment & Plan:  1. Other malaise and fatigue - POCT CBC  2. Essential hypertension, benign - Basic metabolic panel  3. Other and unspecified hyperlipidemia - Hepatic function panel - NMR Lipoprofile with Lipids  4. Unspecified vitamin D deficiency - Vitamin D 25 hydroxy  5. ANEMIA, IRON DEFICIENCY  6. ANXIETY  7. DEPRESSION  8. DIABETES MELLITUS  9. HTN (hypertension)  10. PROSTATE CANCER, HX OF

## 2012-11-16 LAB — NMR LIPOPROFILE WITH LIPIDS
Cholesterol, Total: 103 mg/dL (ref <200)
HDL Particle Number: 28 umol/L — ABNORMAL LOW (ref >=30.5)
LDL (calc): 56 mg/dL (ref <100)
LDL Size: 20.3 nm — ABNORMAL LOW (ref >20.5)
LP-IR Score: 54 — ABNORMAL HIGH (ref <=45)
Large HDL-P: 3.8 umol/L — ABNORMAL LOW (ref >=4.8)
Large VLDL-P: 1.2 nmol/L (ref <=2.7)
Small LDL Particle Number: 480 nmol/L (ref <=527)

## 2012-11-30 ENCOUNTER — Other Ambulatory Visit: Payer: Self-pay | Admitting: Dermatology

## 2012-11-30 DIAGNOSIS — C44611 Basal cell carcinoma of skin of unspecified upper limb, including shoulder: Secondary | ICD-10-CM | POA: Diagnosis not present

## 2012-11-30 DIAGNOSIS — D485 Neoplasm of uncertain behavior of skin: Secondary | ICD-10-CM | POA: Diagnosis not present

## 2012-11-30 DIAGNOSIS — D235 Other benign neoplasm of skin of trunk: Secondary | ICD-10-CM | POA: Diagnosis not present

## 2012-12-01 ENCOUNTER — Other Ambulatory Visit: Payer: Self-pay | Admitting: *Deleted

## 2012-12-01 MED ORDER — OMEPRAZOLE 20 MG PO CPDR: 20.0000 mg | DELAYED_RELEASE_CAPSULE | Freq: Every day | ORAL | Status: DC

## 2012-12-14 ENCOUNTER — Other Ambulatory Visit: Payer: Self-pay

## 2012-12-14 MED ORDER — LOSARTAN POTASSIUM-HCTZ 100-25 MG PO TABS: 1.0000 | ORAL_TABLET | Freq: Every day | ORAL | Status: DC

## 2013-01-01 ENCOUNTER — Other Ambulatory Visit: Payer: Self-pay | Admitting: *Deleted

## 2013-01-01 ENCOUNTER — Other Ambulatory Visit: Payer: Self-pay | Admitting: Dermatology

## 2013-01-01 DIAGNOSIS — C44611 Basal cell carcinoma of skin of unspecified upper limb, including shoulder: Secondary | ICD-10-CM | POA: Diagnosis not present

## 2013-01-01 DIAGNOSIS — C44519 Basal cell carcinoma of skin of other part of trunk: Secondary | ICD-10-CM | POA: Diagnosis not present

## 2013-01-01 MED ORDER — CLONAZEPAM 0.5 MG PO TABS: 0.2500 mg | ORAL_TABLET | Freq: Two times a day (BID) | ORAL | Status: DC | PRN

## 2013-01-01 NOTE — Telephone Encounter (Signed)
CAll in rx for clonazepam with 2 refills

## 2013-01-01 NOTE — Telephone Encounter (Signed)
[facility] pt, last seen 11/15/12, last filled 08/28/12. If approved[facility]

## 2013-01-01 NOTE — Telephone Encounter (Signed)
RX called to vm. 

## 2013-01-08 ENCOUNTER — Telehealth: Payer: Self-pay | Admitting: Family Medicine

## 2013-01-08 NOTE — Telephone Encounter (Signed)
appt made

## 2013-01-11 ENCOUNTER — Ambulatory Visit: Payer: Medicare Other | Admitting: Nurse Practitioner

## 2013-01-16 ENCOUNTER — Other Ambulatory Visit: Payer: Self-pay

## 2013-01-16 MED ORDER — AMLODIPINE BESYLATE 10 MG PO TABS: 10.0000 mg | ORAL_TABLET | Freq: Every day | ORAL | Status: DC

## 2013-01-16 MED ORDER — BENAZEPRIL HCL 20 MG PO TABS: 20.0000 mg | ORAL_TABLET | Freq: Every day | ORAL | Status: DC

## 2013-02-01 ENCOUNTER — Other Ambulatory Visit: Payer: Self-pay

## 2013-02-01 MED ORDER — GLIMEPIRIDE 4 MG PO TABS: ORAL_TABLET | ORAL | Status: DC

## 2013-02-16 ENCOUNTER — Encounter: Payer: Self-pay | Admitting: Gastroenterology

## 2013-02-28 ENCOUNTER — Encounter: Payer: Self-pay | Admitting: Family Medicine

## 2013-02-28 ENCOUNTER — Ambulatory Visit (INDEPENDENT_AMBULATORY_CARE_PROVIDER_SITE_OTHER): Payer: Medicare Other | Admitting: Family Medicine

## 2013-02-28 VITALS — BP 138/66 | HR 75 | Temp 97.2°F | Ht 64.0 in | Wt 210.6 lb

## 2013-02-28 DIAGNOSIS — I10 Essential (primary) hypertension: Secondary | ICD-10-CM | POA: Insufficient documentation

## 2013-02-28 DIAGNOSIS — E785 Hyperlipidemia, unspecified: Secondary | ICD-10-CM

## 2013-02-28 DIAGNOSIS — E559 Vitamin D deficiency, unspecified: Secondary | ICD-10-CM

## 2013-02-28 DIAGNOSIS — E1169 Type 2 diabetes mellitus with other specified complication: Secondary | ICD-10-CM | POA: Insufficient documentation

## 2013-02-28 DIAGNOSIS — E1159 Type 2 diabetes mellitus with other circulatory complications: Secondary | ICD-10-CM | POA: Insufficient documentation

## 2013-02-28 DIAGNOSIS — R5381 Other malaise: Secondary | ICD-10-CM | POA: Diagnosis not present

## 2013-02-28 DIAGNOSIS — R5383 Other fatigue: Secondary | ICD-10-CM

## 2013-02-28 DIAGNOSIS — E119 Type 2 diabetes mellitus without complications: Secondary | ICD-10-CM | POA: Diagnosis not present

## 2013-02-28 LAB — POCT CBC
Granulocyte percent: 66.8 %G (ref 37–80)
MPV: 9.6 fL (ref 0–99.8)
POC Granulocyte: 2.9 (ref 2–6.9)
POC LYMPH PERCENT: 25.8 %L (ref 10–50)
Platelet Count, POC: 227 10*3/uL (ref 142–424)
RBC: 4.8 M/uL (ref 4.69–6.13)
RDW, POC: 12.7 %

## 2013-02-28 LAB — POCT URINALYSIS DIPSTICK
Bilirubin, UA: NEGATIVE
Ketones, UA: NEGATIVE
Leukocytes, UA: NEGATIVE
Spec Grav, UA: 1.015

## 2013-02-28 LAB — POCT GLYCOSYLATED HEMOGLOBIN (HGB A1C): Hemoglobin A1C: 5.4

## 2013-02-28 LAB — POCT UA - MICROSCOPIC ONLY: Bacteria, U Microscopic: NEGATIVE

## 2013-02-28 LAB — POCT UA - MICROALBUMIN: Microalbumin Ur, POC: NEGATIVE mg/L

## 2013-02-28 NOTE — Progress Notes (Addendum)
Subjective:    Patient ID: George Wang, male    DOB: [birth year], 67 y.o.   MRN: [id number]  HPI Patient returns to clinic today for followup and management of chronic medical problems. These include diabetes mellitus type 2 controlled, hypertension, hyperlipidemia, anxiety depression, and a history of prostate cancer. He also has problems with a chronic R. deficiency anemia. His health maintenance parameters are reviewed and he is view and I exam a urine microalbumin and a hemoglobin A1c. Also see the review of systems. He routinely sees the cardiologist, the urologist and the gastroenterologist. Her blood pressures are running 120-130/70-80 range. Fasting blood sugars are running usually 80-115 range.   Review of Systems  Constitutional: Positive for fatigue (slight). Negative for activity change and appetite change.  HENT: Negative for ear pain, congestion, sore throat, sneezing, postnasal drip and sinus pressure.   Eyes: Negative.  Negative for pain, discharge, redness, itching and visual disturbance.  Respiratory: Positive for cough (dry). Negative for apnea, choking, chest tightness, shortness of breath, wheezing and stridor.   Cardiovascular: Negative.  Negative for chest pain, palpitations and leg swelling.  Gastrointestinal: Negative.  Negative for nausea, vomiting, abdominal pain, diarrhea, constipation and blood in stool.  Endocrine: Negative for cold intolerance, heat intolerance, polydipsia, polyphagia and polyuria.  Genitourinary: Positive for frequency (2-3 x at night). Negative for dysuria, urgency, hematuria and testicular pain.  Musculoskeletal: Positive for back pain (cervical and LBP due to DDD) and arthralgias (all over stiffness in the AM). Negative for myalgias.  Skin: Negative.  Negative for color change, pallor, rash and wound.  Allergic/Immunologic: Negative for environmental allergies and immunocompromised state.  Neurological: Positive for numbness (bilateral  hands at night). Negative for dizziness, tremors, weakness, light-headedness and headaches.  Hematological: Negative.  Does not bruise/bleed easily.  Psychiatric/Behavioral: Positive for sleep disturbance (to urinate and due to bilateral hand numbness). Negative for confusion, decreased concentration and agitation. The patient is nervous/anxious (depression, stable).        Objective:   Physical Exam BP 138/66  Pulse 75  Temp(Src) 97.2 F (36.2 C) (Oral)  Ht 5\' 4"  (1.626 m)  Wt 210 lb 9.6 oz (95.528 kg)  BMI 36.13 kg/m2  The patient appeared well nourished and normally developed, alert and oriented to time and place. Speech, behavior and judgement appear normal. Vital signs as documented.  Head exam is unremarkable. No scleral icterus or pallor noted. Ears nose and throat were normal.  Neck is without jugular venous distension, thyromegally, or carotid bruits. Carotid upstrokes are brisk bilaterally. No cervical adenopathy. Lungs are clear anteriorly and posteriorly to auscultation. Normal respiratory effort. Eschar on the right anterior chest from a skin cancer that has been removed by the dermatologist. Cardiac exam reveals regular rate and rhythm at 72 per minute. First and second heart sounds normal.  No murmurs, rubs or gallops.  Abdominal exam reveals obesity, normal bowl sounds, no masses, no organomegaly and no aortic enlargement. No inguinal adenopathy. There was no abdominal tenderness. Extremities are nonedematous and both femoral and pedal pulses are normal. Skin without pallor or jaundice.  Warm and dry, without rash. Neurologic exam reveals normal deep tendon reflexes and normal sensation. A diabetic foot exam will be done today          Assessment & Plan:  1. Hypertension - POCT CBC; Standing - BMP8+EGFR; Standing  2. Hyperlipemia - Hepatic function panel; Standing - NMR, lipoprofile; Standing  3. Type 2 diabetes mellitus - POCT glycosylated hemoglobin (Hb  A1C);  Standing - POCT UA - Microalbumin; Standing - POCT UA - Microscopic Only - POCT urinalysis dipstick - Microalbumin, urine; Standing  4. Vitamin D deficiency - Vitamin D 25 hydroxy; Standing  5. Fatigue - Thyroid Panel With TSH  Patient Instructions  Fall precautions discussed Continue current meds and therapeutic lifestyle changes Return to clinic in September or October for flu shot Please use your access code to create your My Chart account so that you can view your lab and x-ray results and communicate electronically.    Patient in leaving expressed concerned about actos. I reassured him about this day as far as we currently know it is still a good medicine. He is however going to reduce taking it to only one half by mouth daily. If his blood sugar control remains good or gets better we may consider reducing it more.  Nyra Capes MD

## 2013-02-28 NOTE — Patient Instructions (Addendum)
Fall precautions discussed Continue current meds and therapeutic lifestyle changes Return to clinic in September or October for flu shot Please use your access code to create your My Chart account so that you can view your lab and x-ray results and communicate electronically.

## 2013-02-28 NOTE — Addendum Note (Signed)
Addended by: Lisbeth Ply C on: 02/28/2013 12:20 PM   Modules accepted: Orders

## 2013-03-02 LAB — HEPATIC FUNCTION PANEL
ALT: 13 IU/L (ref 0–44)
Bilirubin, Direct: 0.18 mg/dL (ref 0.00–0.40)

## 2013-03-02 LAB — NMR, LIPOPROFILE
HDL Particle Number: 29.6 umol/L — ABNORMAL LOW (ref >=30.5)
LDLC SERPL CALC-MCNC: 59 mg/dL (ref <100)
LP-IR Score: 37 (ref <=45)

## 2013-03-02 LAB — BMP8+EGFR
CO2: 29 mmol/L (ref 18–29)
Calcium: 10.1 mg/dL (ref 8.6–10.2)
Chloride: 102 mmol/L (ref 97–108)
GFR calc non Af Amer: 75 mL/min/{1.73_m2} (ref >59)
Potassium: 4.7 mmol/L (ref 3.5–5.2)
Sodium: 144 mmol/L (ref 134–144)

## 2013-03-02 LAB — VITAMIN D 25 HYDROXY (VIT D DEFICIENCY, FRACTURES): Vit D, 25-Hydroxy: 43.9 ng/mL (ref 30.0–100.0)

## 2013-03-02 LAB — THYROID PANEL WITH TSH: TSH: 0.994 u[IU]/mL (ref 0.450–4.500)

## 2013-03-02 LAB — MICROALBUMIN, URINE: Microalbumin, Urine: <3 ug/mL (ref 0.0–17.0)

## 2013-03-29 DIAGNOSIS — H5200 Hypermetropia, unspecified eye: Secondary | ICD-10-CM | POA: Diagnosis not present

## 2013-03-29 DIAGNOSIS — E119 Type 2 diabetes mellitus without complications: Secondary | ICD-10-CM | POA: Diagnosis not present

## 2013-03-29 DIAGNOSIS — H52229 Regular astigmatism, unspecified eye: Secondary | ICD-10-CM | POA: Diagnosis not present

## 2013-03-29 DIAGNOSIS — H2510 Age-related nuclear cataract, unspecified eye: Secondary | ICD-10-CM | POA: Diagnosis not present

## 2013-04-03 ENCOUNTER — Other Ambulatory Visit: Payer: Self-pay

## 2013-04-03 MED ORDER — SE-TAN PLUS 162-115.2-1 MG PO CAPS: 1.0000 | ORAL_CAPSULE | Freq: Two times a day (BID) | ORAL | Status: DC

## 2013-04-10 DIAGNOSIS — F339 Major depressive disorder, recurrent, unspecified: Secondary | ICD-10-CM | POA: Diagnosis not present

## 2013-04-16 DIAGNOSIS — Z85828 Personal history of other malignant neoplasm of skin: Secondary | ICD-10-CM | POA: Diagnosis not present

## 2013-04-16 DIAGNOSIS — L570 Actinic keratosis: Secondary | ICD-10-CM | POA: Diagnosis not present

## 2013-05-02 ENCOUNTER — Other Ambulatory Visit (INDEPENDENT_AMBULATORY_CARE_PROVIDER_SITE_OTHER): Payer: Medicare Other

## 2013-05-02 DIAGNOSIS — Z1212 Encounter for screening for malignant neoplasm of rectum: Secondary | ICD-10-CM

## 2013-05-04 LAB — FECAL OCCULT BLOOD, IMMUNOCHEMICAL: Fecal Occult Bld: NEGATIVE

## 2013-05-09 ENCOUNTER — Encounter: Payer: Self-pay | Admitting: *Deleted

## 2013-05-14 ENCOUNTER — Ambulatory Visit (INDEPENDENT_AMBULATORY_CARE_PROVIDER_SITE_OTHER): Payer: Medicare Other

## 2013-05-14 ENCOUNTER — Other Ambulatory Visit: Payer: Self-pay

## 2013-05-14 DIAGNOSIS — Z23 Encounter for immunization: Secondary | ICD-10-CM | POA: Diagnosis not present

## 2013-05-14 MED ORDER — METFORMIN HCL 500 MG PO TABS: 500.0000 mg | ORAL_TABLET | Freq: Two times a day (BID) | ORAL | Status: DC

## 2013-05-14 NOTE — Telephone Encounter (Signed)
Last seen and last glucose 02/28/13  DWM 

## 2013-05-16 ENCOUNTER — Other Ambulatory Visit: Payer: Self-pay | Admitting: *Deleted

## 2013-05-16 MED ORDER — BENAZEPRIL HCL 20 MG PO TABS: 20.0000 mg | ORAL_TABLET | Freq: Every day | ORAL | Status: DC

## 2013-05-17 ENCOUNTER — Other Ambulatory Visit: Payer: Self-pay

## 2013-05-17 MED ORDER — ATORVASTATIN CALCIUM 80 MG PO TABS: 80.0000 mg | ORAL_TABLET | Freq: Every day | ORAL | Status: DC

## 2013-05-17 MED ORDER — AMLODIPINE BESYLATE 10 MG PO TABS: 10.0000 mg | ORAL_TABLET | Freq: Every day | ORAL | Status: DC

## 2013-05-17 MED ORDER — LOSARTAN POTASSIUM-HCTZ 100-25 MG PO TABS: 1.0000 | ORAL_TABLET | Freq: Every day | ORAL | Status: DC

## 2013-06-04 ENCOUNTER — Other Ambulatory Visit: Payer: Self-pay | Admitting: *Deleted

## 2013-06-04 MED ORDER — GLIMEPIRIDE 4 MG PO TABS: ORAL_TABLET | ORAL | Status: DC

## 2013-06-04 MED ORDER — OMEPRAZOLE 20 MG PO CPDR: 20.0000 mg | DELAYED_RELEASE_CAPSULE | Freq: Every day | ORAL | Status: DC

## 2013-06-11 ENCOUNTER — Other Ambulatory Visit: Payer: Self-pay

## 2013-06-11 DIAGNOSIS — F339 Major depressive disorder, recurrent, unspecified: Secondary | ICD-10-CM | POA: Diagnosis not present

## 2013-06-11 MED ORDER — METFORMIN HCL 500 MG PO TABS: 500.0000 mg | ORAL_TABLET | Freq: Two times a day (BID) | ORAL | Status: DC

## 2013-06-11 NOTE — Telephone Encounter (Signed)
Last seen and last glucose 02/28/13  DWM

## 2013-06-12 ENCOUNTER — Encounter: Payer: Self-pay | Admitting: Family Medicine

## 2013-06-12 ENCOUNTER — Ambulatory Visit (INDEPENDENT_AMBULATORY_CARE_PROVIDER_SITE_OTHER): Payer: Medicare Other | Admitting: Family Medicine

## 2013-06-12 ENCOUNTER — Ambulatory Visit (INDEPENDENT_AMBULATORY_CARE_PROVIDER_SITE_OTHER): Payer: Medicare Other

## 2013-06-12 VITALS — BP 119/76 | HR 77 | Temp 98.1°F | Ht 64.0 in | Wt 207.0 lb

## 2013-06-12 DIAGNOSIS — E119 Type 2 diabetes mellitus without complications: Secondary | ICD-10-CM | POA: Diagnosis not present

## 2013-06-12 DIAGNOSIS — I10 Essential (primary) hypertension: Secondary | ICD-10-CM | POA: Diagnosis not present

## 2013-06-12 DIAGNOSIS — E785 Hyperlipidemia, unspecified: Secondary | ICD-10-CM

## 2013-06-12 DIAGNOSIS — R5381 Other malaise: Secondary | ICD-10-CM

## 2013-06-12 DIAGNOSIS — Z8546 Personal history of malignant neoplasm of prostate: Secondary | ICD-10-CM

## 2013-06-12 DIAGNOSIS — E559 Vitamin D deficiency, unspecified: Secondary | ICD-10-CM

## 2013-06-12 DIAGNOSIS — F329 Major depressive disorder, single episode, unspecified: Secondary | ICD-10-CM

## 2013-06-12 DIAGNOSIS — D509 Iron deficiency anemia, unspecified: Secondary | ICD-10-CM

## 2013-06-12 DIAGNOSIS — F411 Generalized anxiety disorder: Secondary | ICD-10-CM

## 2013-06-12 LAB — POCT CBC
HCT, POC: 38.6 % — AB (ref 43.5–53.7)
Hemoglobin: 12.5 g/dL — AB (ref 14.1–18.1)
Lymph, poc: 2 (ref 0.6–3.4)
MCH, POC: 31 pg (ref 27–31.2)
MCV: 95.8 fL (ref 80–97)
POC LYMPH PERCENT: 28.7 %L (ref 10–50)
Platelet Count, POC: 234 10*3/uL (ref 142–424)
RBC: 4 M/uL — AB (ref 4.69–6.13)

## 2013-06-12 LAB — POCT GLYCOSYLATED HEMOGLOBIN (HGB A1C): Hemoglobin A1C: 5.2

## 2013-06-12 NOTE — Progress Notes (Signed)
Subjective:    Patient ID: George Wang, male    DOB: August 03, 1945, 67 y.o.   MRN: [id number]  HPI Pt here for follow up and management of chronic medical problems. His biggest concern today is his continued depression. He is followed at the [facility]. They just switched him to Remeron. He indicates that his home blood sugars are usually 110 or higher. He has been off of Actos for several months. As of note the patient gets his eye exams yearly in August.      Patient Active Problem List   Diagnosis Date Noted  . Hypertension 02/28/2013  . Hyperlipemia 02/28/2013  . Type 2 diabetes mellitus 02/28/2013  . Vitamin D deficiency 02/28/2013  . Overweight 10/13/2011  . PVC (premature ventricular contraction) 10/13/2011  . HEMOCCULT POSITIVE STOOL 08/14/2008  . ANEMIA, IRON DEFICIENCY 06/27/2008  . GOUT 04/25/2008  . ANXIETY 04/25/2008  . DEPRESSION 04/25/2008  . DIVERTICULOSIS, COLON 04/25/2008  . IRRITABLE BOWEL SYNDROME 04/25/2008  . NEPHROLITHIASIS 04/25/2008  . PROSTATE CANCER, HX OF 04/25/2008   Outpatient Encounter Prescriptions as of 06/12/2013  Medication Sig  . amLODipine (NORVASC) 10 MG tablet Take 1 tablet (10 mg total) by mouth daily.  Marland Kitchen atorvastatin (LIPITOR) 80 MG tablet Take 1 tablet (80 mg total) by mouth daily.  . benazepril (LOTENSIN) 20 MG tablet Take 1 tablet (20 mg total) by mouth daily.  . beta carotene 78295 UNIT capsule Take 25,000 Units by mouth daily.  . Cholecalciferol (VITAMIN D) 2000 UNITS CAPS Take 1 capsule by mouth daily.  . clonazePAM (KLONOPIN) 0.5 MG tablet Take 0.5 tablets (0.25 mg total) by mouth 2 (two) times daily as needed.  . FeFum-FePo-FA-B Cmp-C-Zn-Mn-Cu (SE-TAN PLUS) 162-115.2-1 MG CAPS Take 1 tablet by mouth 2 (two) times daily.  . fish oil-omega-3 fatty acids 1000 MG capsule Take 2 g by mouth daily.  Marland Kitchen glimepiride (AMARYL) 4 MG tablet Take 1/2 tab bid  . losartan-hydrochlorothiazide (HYZAAR) 100-25 MG per tablet  Take 1 tablet by mouth daily.  . metFORMIN (GLUCOPHAGE) 500 MG tablet Take 1 tablet (500 mg total) by mouth 2 (two) times daily with a meal.  . mirtazapine (REMERON) 15 MG tablet Take 15 mg by mouth daily.  . Multiple Vitamins-Minerals (CENTRUM SILVER PO) Take by mouth.  Marland Kitchen omeprazole (PRILOSEC) 20 MG capsule Take 1 capsule (20 mg total) by mouth daily.  Marland Kitchen PARoxetine (PAXIL) 20 MG tablet Take 40 mg by mouth every morning.   . pioglitazone (ACTOS) 30 MG tablet Take 30 mg by mouth daily.  . [DISCONTINUED] FeFum-FePo-FA-B Cmp-C-Zn-Mn-Cu (RE DUALVIT PLUS) 162-115.2-1 MG CAPS Take by mouth daily.    Review of Systems  Constitutional: Negative.   HENT: Negative.   Eyes: Negative.   Respiratory: Negative.   Cardiovascular: Negative.   Gastrointestinal: Negative.   Endocrine: Negative.   Genitourinary: Negative.   Musculoskeletal: Negative.   Skin: Negative.   Allergic/Immunologic: Negative.   Neurological: Negative.   Hematological: Negative.   Psychiatric/Behavioral: The patient is nervous/anxious (depression- sees Saul Fordyce).        Objective:   Physical Exam  Nursing note and vitals reviewed. Constitutional: He is oriented to person, place, and time. He appears well-developed and well-nourished. No distress.  HENT:  Head: Normocephalic and atraumatic.  Right Ear: External ear normal.  Left Ear: External ear normal.  Mouth/Throat: Oropharynx is clear and moist. No oropharyngeal exudate.  Nasal congestion left greater than right  Eyes: Conjunctivae and EOM are normal. Pupils are equal, round,  and reactive to light. Right eye exhibits no discharge. Left eye exhibits no discharge. No scleral icterus.  Neck: Normal range of motion. Neck supple. No thyromegaly present.  Cardiovascular: Normal rate, regular rhythm, normal heart sounds and intact distal pulses.  Exam reveals no gallop and no friction rub.   No murmur heard. At 72 per minute  Pulmonary/Chest: Effort normal and breath  sounds normal. No respiratory distress. He has no wheezes. He has no rales. He exhibits no tenderness.  Obesity  Abdominal: Soft. Bowel sounds are normal. He exhibits no distension and no mass. There is no tenderness. There is no rebound and no guarding.  Obesity  Musculoskeletal: Normal range of motion. He exhibits no edema and no tenderness.  Lymphadenopathy:    He has no cervical adenopathy.  Neurological: He is alert and oriented to person, place, and time. He has normal reflexes. No cranial nerve deficit.  Skin: Skin is warm and dry. No rash noted. No erythema. No pallor.  Psychiatric: His behavior is normal. Judgment and thought content normal.  Somewhat flat affect   BP 119/76  Pulse 77  Temp(Src) 98.1 F (36.7 C) (Oral)  Ht 5\' 4"  (1.626 m)  Wt 207 lb (93.895 kg)  BMI 35.51 kg/m2        Assessment & Plan:    1. Type 2 diabetes mellitus   2. ANEMIA, IRON DEFICIENCY   3. Hyperlipemia   4. Hypertension   5. PROSTATE CANCER, HX OF   6. Vitamin D deficiency   7. Other malaise and fatigue   8. Depressive disorder, not elsewhere classified   9. Anxiety state, unspecified    Orders Placed This Encounter  Procedures  . DG Chest 2 View    Standing Status: Future     Number of Occurrences: 1     Standing Expiration Date: 08/12/2014    Order Specific Question:  Reason for Exam (SYMPTOM  OR DIAGNOSIS REQUIRED)    Answer:  htn    Order Specific Question:  Preferred imaging location?    Answer:  Internal  . Hepatic function panel  . BMP8+EGFR  . NMR, lipoprofile  . Vit D  25 hydroxy (rtn osteoporosis monitoring)  . PSA, total and free  . Thyroid Panel With TSH  . POCT CBC  . POCT glycosylated hemoglobin (Hb A1C)   Meds ordered this encounter  Medications  . mirtazapine (REMERON) 15 MG tablet    Sig: Take 15 mg by mouth daily.   Patient Instructions  Continue current medications. Continue good therapeutic lifestyle changes which include good diet and  exercise. Fall precautions discussed with patient. Schedule your flu vaccine if you haven't had it yet If you are over 90 years old - you may need Prevnar 13 or the adult Pneumonia vaccine. Continue to monitor blood sugars and blood pressures closely at home Try to get more active physically and drink more water Please call back in a couple of weeks and come and receive your Prevnar vaccine Continue with followup visits at the [facility]   Nyra Capes MD

## 2013-06-12 NOTE — Patient Instructions (Addendum)
Continue current medications. Continue good therapeutic lifestyle changes which include good diet and exercise. Fall precautions discussed with patient. Schedule your flu vaccine if you haven't had it yet If you are over 69 years old - you may need Prevnar 13 or the adult Pneumonia vaccine. Continue to monitor blood sugars and blood pressures closely at home Try to get more active physically and drink more water Please call back in a couple of weeks and come and receive your Prevnar vaccine Continue with followup visits at the [facility]

## 2013-06-14 LAB — HEPATIC FUNCTION PANEL
ALT: 16 IU/L (ref 0–44)
AST: 28 IU/L (ref 0–40)
Albumin: 4.2 g/dL (ref 3.6–4.8)
Alkaline Phosphatase: 71 IU/L (ref 39–117)
Bilirubin, Direct: 0.18 mg/dL (ref 0.00–0.40)
Total Bilirubin: 0.6 mg/dL (ref 0.0–1.2)

## 2013-06-14 LAB — PSA, TOTAL AND FREE: PSA: <0.1 ng/mL (ref 0.0–4.0)

## 2013-06-14 LAB — BMP8+EGFR
BUN/Creatinine Ratio: 14 (ref 10–22)
CO2: 29 mmol/L (ref 18–29)
Calcium: 9.4 mg/dL (ref 8.6–10.2)
Chloride: 101 mmol/L (ref 97–108)
GFR calc Af Amer: 89 mL/min/{1.73_m2} (ref >59)
Potassium: 4.6 mmol/L (ref 3.5–5.2)
Sodium: 145 mmol/L — ABNORMAL HIGH (ref 134–144)

## 2013-06-14 LAB — NMR, LIPOPROFILE
HDL Particle Number: 25.1 umol/L — ABNORMAL LOW (ref >=30.5)
LDL Size: 19.8 nm — ABNORMAL LOW (ref >20.5)
Small LDL Particle Number: 978 nmol/L — ABNORMAL HIGH (ref <=527)

## 2013-06-14 LAB — THYROID PANEL WITH TSH: Free Thyroxine Index: 2.3 (ref 1.2–4.9)

## 2013-06-14 LAB — VITAMIN D 25 HYDROXY (VIT D DEFICIENCY, FRACTURES): Vit D, 25-Hydroxy: 50.5 ng/mL (ref 30.0–100.0)

## 2013-06-18 ENCOUNTER — Other Ambulatory Visit: Payer: Self-pay

## 2013-06-18 MED ORDER — PAROXETINE HCL 20 MG PO TABS: 40.0000 mg | ORAL_TABLET | ORAL | Status: DC

## 2013-06-18 NOTE — Telephone Encounter (Signed)
Pharmacy sent over as qd  In EPIC as BID   ????  Last seen 06/12/13  DWM

## 2013-06-18 NOTE — Telephone Encounter (Signed)
Please confirm this with patient before refilling it, Epic says 20 mg, 2  daily but once again confirm with patient before refilling

## 2013-06-19 ENCOUNTER — Ambulatory Visit (INDEPENDENT_AMBULATORY_CARE_PROVIDER_SITE_OTHER): Payer: Medicare Other | Admitting: *Deleted

## 2013-06-19 DIAGNOSIS — N393 Stress incontinence (female) (male): Secondary | ICD-10-CM | POA: Diagnosis not present

## 2013-06-19 DIAGNOSIS — N529 Male erectile dysfunction, unspecified: Secondary | ICD-10-CM | POA: Diagnosis not present

## 2013-06-19 DIAGNOSIS — Z23 Encounter for immunization: Secondary | ICD-10-CM | POA: Diagnosis not present

## 2013-06-19 DIAGNOSIS — E291 Testicular hypofunction: Secondary | ICD-10-CM | POA: Diagnosis not present

## 2013-06-19 DIAGNOSIS — Z8546 Personal history of malignant neoplasm of prostate: Secondary | ICD-10-CM | POA: Diagnosis not present

## 2013-07-02 ENCOUNTER — Other Ambulatory Visit: Payer: Self-pay

## 2013-07-02 MED ORDER — CLONAZEPAM 0.5 MG PO TABS: 0.2500 mg | ORAL_TABLET | Freq: Two times a day (BID) | ORAL | Status: DC | PRN

## 2013-07-02 NOTE — Telephone Encounter (Signed)
[facility]ed into the [facility] 

## 2013-07-02 NOTE — Telephone Encounter (Signed)
This prescription is okay with 5 refill

## 2013-07-02 NOTE — Telephone Encounter (Signed)
Last seen 06/12/13  DWM  If approved route to nurse to call into the Drug Store

## 2013-07-06 ENCOUNTER — Other Ambulatory Visit: Payer: Self-pay | Admitting: *Deleted

## 2013-07-06 MED ORDER — METFORMIN HCL 500 MG PO TABS: 500.0000 mg | ORAL_TABLET | Freq: Two times a day (BID) | ORAL | Status: DC

## 2013-07-06 MED ORDER — GLIMEPIRIDE 4 MG PO TABS: ORAL_TABLET | ORAL | Status: DC

## 2013-07-26 ENCOUNTER — Other Ambulatory Visit: Payer: Self-pay

## 2013-07-26 MED ORDER — SE-TAN PLUS 162-115.2-1 MG PO CAPS: 1.0000 | ORAL_CAPSULE | Freq: Two times a day (BID) | ORAL | Status: DC

## 2013-08-13 DIAGNOSIS — F339 Major depressive disorder, recurrent, unspecified: Secondary | ICD-10-CM | POA: Diagnosis not present

## 2013-08-21 DIAGNOSIS — F339 Major depressive disorder, recurrent, unspecified: Secondary | ICD-10-CM | POA: Diagnosis not present

## 2013-09-03 ENCOUNTER — Other Ambulatory Visit: Payer: Self-pay

## 2013-09-03 MED ORDER — GLIMEPIRIDE 4 MG PO TABS: ORAL_TABLET | ORAL | Status: DC

## 2013-09-06 DIAGNOSIS — F339 Major depressive disorder, recurrent, unspecified: Secondary | ICD-10-CM | POA: Diagnosis not present

## 2013-09-17 ENCOUNTER — Other Ambulatory Visit: Payer: Self-pay | Admitting: *Deleted

## 2013-09-17 MED ORDER — LOSARTAN POTASSIUM-HCTZ 100-25 MG PO TABS: 1.0000 | ORAL_TABLET | Freq: Every day | ORAL | Status: DC

## 2013-09-17 MED ORDER — AMLODIPINE BESYLATE 10 MG PO TABS: 10.0000 mg | ORAL_TABLET | Freq: Every day | ORAL | Status: DC

## 2013-09-20 ENCOUNTER — Other Ambulatory Visit: Payer: Self-pay | Admitting: Family Medicine

## 2013-09-24 ENCOUNTER — Ambulatory Visit (INDEPENDENT_AMBULATORY_CARE_PROVIDER_SITE_OTHER): Payer: Medicare Other | Admitting: Family Medicine

## 2013-09-24 ENCOUNTER — Encounter: Payer: Self-pay | Admitting: Family Medicine

## 2013-09-24 VITALS — BP 111/62 | HR 68 | Temp 97.1°F | Ht 64.0 in | Wt 208.0 lb

## 2013-09-24 DIAGNOSIS — F329 Major depressive disorder, single episode, unspecified: Secondary | ICD-10-CM

## 2013-09-24 DIAGNOSIS — E559 Vitamin D deficiency, unspecified: Secondary | ICD-10-CM | POA: Diagnosis not present

## 2013-09-24 DIAGNOSIS — D509 Iron deficiency anemia, unspecified: Secondary | ICD-10-CM | POA: Diagnosis not present

## 2013-09-24 DIAGNOSIS — R5383 Other fatigue: Secondary | ICD-10-CM

## 2013-09-24 DIAGNOSIS — I10 Essential (primary) hypertension: Secondary | ICD-10-CM | POA: Diagnosis not present

## 2013-09-24 DIAGNOSIS — R5381 Other malaise: Secondary | ICD-10-CM | POA: Diagnosis not present

## 2013-09-24 DIAGNOSIS — F411 Generalized anxiety disorder: Secondary | ICD-10-CM | POA: Diagnosis not present

## 2013-09-24 DIAGNOSIS — F32A Depression, unspecified: Secondary | ICD-10-CM

## 2013-09-24 DIAGNOSIS — F3289 Other specified depressive episodes: Secondary | ICD-10-CM

## 2013-09-24 DIAGNOSIS — E785 Hyperlipidemia, unspecified: Secondary | ICD-10-CM

## 2013-09-24 DIAGNOSIS — E291 Testicular hypofunction: Secondary | ICD-10-CM

## 2013-09-24 DIAGNOSIS — E349 Endocrine disorder, unspecified: Secondary | ICD-10-CM

## 2013-09-24 DIAGNOSIS — E119 Type 2 diabetes mellitus without complications: Secondary | ICD-10-CM | POA: Diagnosis not present

## 2013-09-24 LAB — POCT CBC
Granulocyte percent: 75.5 %G (ref 37–80)
HEMATOCRIT: 37.6 % — AB (ref 43.5–53.7)
HEMOGLOBIN: 12.5 g/dL — AB (ref 14.1–18.1)
Lymph, poc: 1.6 (ref 0.6–3.4)
MCH, POC: 32.3 pg — AB (ref 27–31.2)
MCHC: 33.4 g/dL (ref 31.8–35.4)
MCV: 96.7 fL (ref 80–97)
MPV: 7.9 fL (ref 0–99.8)
POC GRANULOCYTE: 6.6 (ref 2–6.9)
POC LYMPH %: 18.2 % (ref 10–50)
Platelet Count, POC: 225 10*3/uL (ref 142–424)
RBC: 3.9 M/uL — AB (ref 4.69–6.13)
RDW, POC: 12.3 %
WBC: 8.7 10*3/uL (ref 4.6–10.2)

## 2013-09-24 LAB — POCT GLYCOSYLATED HEMOGLOBIN (HGB A1C)

## 2013-09-24 LAB — POCT UA - MICROALBUMIN: Microalbumin Ur, POC: 20 mg/L

## 2013-09-24 NOTE — Patient Instructions (Addendum)
Medicare Annual Wellness Visit  [facility] and the medical providers at [facility] strive to bring you the best medical care.  In doing so we not only want to address your current medical conditions and concerns but also to detect new conditions early and prevent illness, disease and health-related problems.    Medicare offers a yearly Wellness Visit which allows our clinical staff to assess your need for preventative services including immunizations, lifestyle education, counseling to decrease risk of preventable diseases and screening for fall risk and other medical concerns.    This visit is provided free of charge (no copay) for all Medicare recipients. The clinical pharmacists at [facility] have begun to conduct these Wellness Visits which will also include a thorough review of all your medications.    As you primary medical provider recommend that you make an appointment for your Annual Wellness Visit if you have not done so already this year.  You may set up this appointment before you leave today or you may call back ([phone]) and schedule an appointment.  Please make sure when you call that you mention that you are scheduling your Annual Wellness Visit with the clinical pharmacist so that the appointment may be made for the proper length of time.     Continue current medications. Continue good therapeutic lifestyle changes which include good diet and exercise. Fall precautions discussed with patient. If an FOBT was given today- please return it to our front desk. If you are over 9 years old - you may need Prevnar 64 or the adult Pneumonia vaccine.  Please let has a few want to consider trying the testosterone. If you do out will discuss this with the urologist before we initiate this and have him to followup on this.

## 2013-09-24 NOTE — Addendum Note (Signed)
Addended by: Pollyann Kennedy F on: 09/24/2013 12:30 PM   Modules accepted: Orders

## 2013-09-24 NOTE — Progress Notes (Signed)
Subjective:    Patient ID: George Wang, male    DOB: 12-Jan-1946, 68 y.o.   MRN: [id number]  HPI Pt here for follow up and management of chronic medical problems. The patient does complain of some joint pain. The patient still remains very depressed. He is currently going to the [facility]. He has been worse this winter and not able to get out of the house.        Patient Active Problem List   Diagnosis Date Noted  . Hypertension 02/28/2013  . Hyperlipemia 02/28/2013  . Type 2 diabetes mellitus 02/28/2013  . Vitamin D deficiency 02/28/2013  . Overweight 10/13/2011  . PVC (premature ventricular contraction) 10/13/2011  . HEMOCCULT POSITIVE STOOL 08/14/2008  . ANEMIA, IRON DEFICIENCY 06/27/2008  . GOUT 04/25/2008  . ANXIETY 04/25/2008  . DEPRESSION 04/25/2008  . DIVERTICULOSIS, COLON 04/25/2008  . IRRITABLE BOWEL SYNDROME 04/25/2008  . NEPHROLITHIASIS 04/25/2008  . PROSTATE CANCER, HX OF 04/25/2008   Outpatient Encounter Prescriptions as of 09/24/2013  Medication Sig  . amLODipine (NORVASC) 10 MG tablet Take 1 tablet (10 mg total) by mouth daily.  Marland Kitchen atorvastatin (LIPITOR) 80 MG tablet Take 1 tablet (80 mg total) by mouth daily.  . benazepril (LOTENSIN) 20 MG tablet Take 1 tablet (20 mg total) by mouth daily.  . beta carotene 25000 UNIT capsule Take 25,000 Units by mouth daily.  . Cholecalciferol (VITAMIN D) 2000 UNITS CAPS Take 1 capsule by mouth daily.  . clonazePAM (KLONOPIN) 0.5 MG tablet Take 0.5 tablets (0.25 mg total) by mouth 2 (two) times daily as needed.  . FeFum-FePo-FA-B Cmp-C-Zn-Mn-Cu (SE-TAN PLUS) 162-115.2-1 MG CAPS TAKE ONE CAPSULE BY MOUTH TWICE A DAY  . fish oil-omega-3 fatty acids 1000 MG capsule Take 2 g by mouth daily.  Marland Kitchen glimepiride (AMARYL) 4 MG tablet Take 1/2 tab bid  . hydrOXYzine (ATARAX/VISTARIL) 25 MG tablet Take 25 mg by mouth 3 (three) times daily as needed.  . lamoTRIgine (LAMICTAL) 25 MG tablet Take 100 mg by mouth  daily.  Marland Kitchen losartan-hydrochlorothiazide (HYZAAR) 100-25 MG per tablet Take 1 tablet by mouth daily.  . metFORMIN (GLUCOPHAGE) 500 MG tablet Take 1 tablet (500 mg total) by mouth 2 (two) times daily with a meal.  . Multiple Vitamins-Minerals (CENTRUM SILVER PO) Take by mouth.  Marland Kitchen omeprazole (PRILOSEC) 20 MG capsule Take 1 capsule (20 mg total) by mouth daily.  Marland Kitchen PARoxetine (PAXIL) 20 MG tablet Take 2 tablets (40 mg total) by mouth every morning.  . pioglitazone (ACTOS) 30 MG tablet Take 30 mg by mouth daily.  . [DISCONTINUED] mirtazapine (REMERON) 15 MG tablet Take 15 mg by mouth daily.    Review of Systems  Constitutional: Negative.   HENT: Negative.   Eyes: Negative.   Respiratory: Negative.   Cardiovascular: Negative.   Gastrointestinal: Negative.   Endocrine: Negative.   Genitourinary: Negative.   Musculoskeletal: Positive for arthralgias (right knee numbness at times).  Skin: Negative.   Allergic/Immunologic: Negative.   Neurological: Negative.   Hematological: Negative.   Psychiatric/Behavioral: Negative.        Objective:   Physical Exam  Nursing note and vitals reviewed. Constitutional: He is oriented to person, place, and time. He appears well-developed and well-nourished.  Somewhat depressed affect  HENT:  Head: Normocephalic and atraumatic.  Right Ear: External ear normal.  Left Ear: External ear normal.  Nose: Nose normal.  Mouth/Throat: Oropharynx is clear and moist. No oropharyngeal exudate.  Eyes: Conjunctivae and EOM are normal. Pupils are  equal, round, and reactive to light. Right eye exhibits no discharge. Left eye exhibits no discharge. No scleral icterus.  Neck: Normal range of motion. Neck supple. No thyromegaly present.  No carotid bruits auscultated  Cardiovascular: Normal rate, regular rhythm, normal heart sounds and intact distal pulses.  Exam reveals no gallop and no friction rub.   No murmur heard. At 72 per minute  Pulmonary/Chest: Effort normal  and breath sounds normal. No respiratory distress. He has no wheezes. He has no rales.  Abdominal: Soft. Bowel sounds are normal. He exhibits no mass. There is no tenderness. There is no rebound and no guarding.  Obesity without masses or tenderness  Genitourinary:  B. prostate exam and rectal exam were done by the urologist in December  Musculoskeletal: Normal range of motion. He exhibits no edema and no tenderness.  Lymphadenopathy:    He has no cervical adenopathy.  Neurological: He is alert and oriented to person, place, and time. He has normal reflexes. No cranial nerve deficit.  Skin: Skin is warm and dry. No rash noted. No erythema. No pallor.  Psychiatric: His behavior is normal. Judgment and thought content normal.  Depressed   BP 111/62  Pulse 68  Temp(Src) 97.1 F (36.2 C) (Oral)  Ht $R'5\' 4"'GB$  (1.626 m)  Wt 208 lb (94.348 kg)  BMI 35.69 kg/m2  Results for orders placed in visit on 09/24/13  POCT CBC      Result Value Ref Range   WBC 4.0 (*) 4.6 - 10.2 K/uL   Lymph, poc 1.0  0.6 - 3.4   POC LYMPH PERCENT 23.9  10 - 50 %L   POC Granulocyte 2.9  2 - 6.9   Granulocyte percent 73.7  37 - 80 %G   RBC 6.0  4.69 - 6.13 M/uL   Hemoglobin 18.6 (*) 14.1 - 18.1 g/dL   HCT, POC 56.8 (*) 43.5 - 53.7 %   MCV 95.5  80 - 97 fL   MCH, POC 31.2  27 - 31.2 pg   MCHC 32.7  31.8 - 35.4 g/dL   RDW, POC 12.5     Platelet Count, POC 95.0 (*) 142 - 424 K/uL   MPV 8.3  0 - 99.8 fL  POCT GLYCOSYLATED HEMOGLOBIN (HGB A1C)      Result Value Ref Range   Hemoglobin A1C 5.8 %           Assessment & Plan:  1. Hypertension - POCT CBC - BMP8+EGFR  2. Type 2 diabetes mellitus - POCT glycosylated hemoglobin (Hb A1C) - POCT UA - Microalbumin  3. Hyperlipemia - Hepatic function panel - NMR, lipoprofile  4. Vitamin D deficiency - Vitamin D 25 hydroxy  5. ANEMIA, IRON DEFICIENCY  6. ANXIETY - Testosterone,Free and Total  7. Other malaise and fatigue - Testosterone,Free and  Total  8. Depression -Continued followup with the psychologist  9. Testosterone deficiency -Testosterone level  Patient Instructions                       Medicare Annual Wellness Visit  [facility] and the medical providers at [facility] strive to bring you the best medical care.  In doing so we not only want to address your current medical conditions and concerns but also to detect new conditions early and prevent illness, disease and health-related problems.    Medicare offers a yearly Wellness Visit which allows our clinical staff to assess your need for preventative services including immunizations,  lifestyle education, counseling to decrease risk of preventable diseases and screening for fall risk and other medical concerns.    This visit is provided free of charge (no copay) for all Medicare recipients. The clinical pharmacists at [facility] have begun to conduct these Wellness Visits which will also include a thorough review of all your medications.    As you primary medical provider recommend that you make an appointment for your Annual Wellness Visit if you have not done so already this year.  You may set up this appointment before you leave today or you may call back ([phone]) and schedule an appointment.  Please make sure when you call that you mention that you are scheduling your Annual Wellness Visit with the clinical pharmacist so that the appointment may be made for the proper length of time.     Continue current medications. Continue good therapeutic lifestyle changes which include good diet and exercise. Fall precautions discussed with patient. If an FOBT was given today- please return it to our front desk. If you are over 42 years old - you may need Prevnar 47 or the adult Pneumonia vaccine.  Please let has a few want to consider trying the testosterone. If you do out will discuss this with the urologist before we initiate  this and have him to followup on this.   Arrie Senate MD

## 2013-09-25 LAB — MICROALBUMIN, URINE: MICROALBUM., U, RANDOM: 50.4 ug/mL — AB (ref 0.0–17.0)

## 2013-09-26 ENCOUNTER — Ambulatory Visit: Payer: Medicare Other | Admitting: Family Medicine

## 2013-09-26 LAB — NMR, LIPOPROFILE
CHOLESTEROL: 110 mg/dL (ref <200)
HDL Cholesterol by NMR: 36 mg/dL — ABNORMAL LOW (ref >=40)
HDL PARTICLE NUMBER: 25.3 umol/L — AB (ref >=30.5)
LDL PARTICLE NUMBER: 1027 nmol/L — AB (ref <1000)
LDL Size: 20.3 nm — ABNORMAL LOW (ref >20.5)
LDLC SERPL CALC-MCNC: 57 mg/dL (ref <100)
LP-IR SCORE: 52 — AB (ref <=45)
Small LDL Particle Number: 679 nmol/L — ABNORMAL HIGH (ref <=527)
Triglycerides by NMR: 87 mg/dL (ref <150)

## 2013-09-26 LAB — BMP8+EGFR
BUN / CREAT RATIO: 15 (ref 10–22)
BUN: 16 mg/dL (ref 8–27)
CALCIUM: 9.4 mg/dL (ref 8.6–10.2)
CO2: 28 mmol/L (ref 18–29)
Chloride: 99 mmol/L (ref 97–108)
Creatinine, Ser: 1.05 mg/dL (ref 0.76–1.27)
GFR calc Af Amer: 83 mL/min/{1.73_m2} (ref >59)
GFR calc non Af Amer: 72 mL/min/{1.73_m2} (ref >59)
Glucose: 87 mg/dL (ref 65–99)
Potassium: 3.8 mmol/L (ref 3.5–5.2)
Sodium: 144 mmol/L (ref 134–144)

## 2013-09-26 LAB — HEPATIC FUNCTION PANEL
ALBUMIN: 4.3 g/dL (ref 3.6–4.8)
ALT: 23 IU/L (ref 0–44)
AST: 35 IU/L (ref 0–40)
Alkaline Phosphatase: 79 IU/L (ref 39–117)
Bilirubin, Direct: 0.2 mg/dL (ref 0.00–0.40)
Total Bilirubin: 0.8 mg/dL (ref 0.0–1.2)
Total Protein: 6.4 g/dL (ref 6.0–8.5)

## 2013-09-26 LAB — VITAMIN D 25 HYDROXY (VIT D DEFICIENCY, FRACTURES): Vit D, 25-Hydroxy: 38.4 ng/mL (ref 30.0–100.0)

## 2013-09-26 LAB — TESTOSTERONE,FREE AND TOTAL
Testosterone, Free: 1.4 pg/mL — ABNORMAL LOW (ref 6.6–18.1)
Testosterone: 47 ng/dL — ABNORMAL LOW (ref 348–1197)

## 2013-10-01 ENCOUNTER — Other Ambulatory Visit: Payer: Self-pay | Admitting: Family Medicine

## 2013-10-08 ENCOUNTER — Other Ambulatory Visit: Payer: Self-pay | Admitting: Family Medicine

## 2013-10-11 ENCOUNTER — Encounter: Payer: Self-pay | Admitting: *Deleted

## 2013-10-11 DIAGNOSIS — D235 Other benign neoplasm of skin of trunk: Secondary | ICD-10-CM | POA: Diagnosis not present

## 2013-10-11 DIAGNOSIS — L819 Disorder of pigmentation, unspecified: Secondary | ICD-10-CM | POA: Diagnosis not present

## 2013-10-11 DIAGNOSIS — L570 Actinic keratosis: Secondary | ICD-10-CM | POA: Diagnosis not present

## 2013-10-11 DIAGNOSIS — D1801 Hemangioma of skin and subcutaneous tissue: Secondary | ICD-10-CM | POA: Diagnosis not present

## 2013-10-15 ENCOUNTER — Other Ambulatory Visit: Payer: Self-pay | Admitting: Family Medicine

## 2013-10-22 ENCOUNTER — Other Ambulatory Visit (INDEPENDENT_AMBULATORY_CARE_PROVIDER_SITE_OTHER): Payer: Medicare Other

## 2013-10-22 DIAGNOSIS — R7989 Other specified abnormal findings of blood chemistry: Secondary | ICD-10-CM

## 2013-10-22 LAB — POCT CBC
GRANULOCYTE PERCENT: 63.2 % (ref 37–80)
HEMATOCRIT: 37.5 % — AB (ref 43.5–53.7)
Hemoglobin: 12 g/dL — AB (ref 14.1–18.1)
Lymph, poc: 1.6 (ref 0.6–3.4)
MCH, POC: 31 pg (ref 27–31.2)
MCHC: 32.1 g/dL (ref 31.8–35.4)
MCV: 96.4 fL (ref 80–97)
MPV: 7.8 fL (ref 0–99.8)
POC Granulocyte: 3.5 (ref 2–6.9)
POC LYMPH PERCENT: 28.4 %L (ref 10–50)
Platelet Count, POC: 220 10*3/uL (ref 142–424)
RBC: 3.9 M/uL — AB (ref 4.69–6.13)
RDW, POC: 12.4 %
WBC: 5.6 10*3/uL (ref 4.6–10.2)

## 2013-10-22 NOTE — Progress Notes (Signed)
Patient came in for labs only.

## 2013-11-12 ENCOUNTER — Other Ambulatory Visit: Payer: Medicare Other

## 2013-11-12 DIAGNOSIS — Z1212 Encounter for screening for malignant neoplasm of rectum: Secondary | ICD-10-CM

## 2013-11-13 ENCOUNTER — Other Ambulatory Visit: Payer: Self-pay | Admitting: Family Medicine

## 2013-11-14 LAB — FECAL OCCULT BLOOD, IMMUNOCHEMICAL: FECAL OCCULT BLD: NEGATIVE

## 2013-11-19 ENCOUNTER — Encounter: Payer: Self-pay | Admitting: *Deleted

## 2013-11-26 ENCOUNTER — Other Ambulatory Visit (INDEPENDENT_AMBULATORY_CARE_PROVIDER_SITE_OTHER): Payer: Medicare Other

## 2013-11-26 DIAGNOSIS — R7989 Other specified abnormal findings of blood chemistry: Secondary | ICD-10-CM

## 2013-11-26 LAB — POCT CBC
GRANULOCYTE PERCENT: 66.5 % (ref 37–80)
HEMATOCRIT: 34.9 % — AB (ref 43.5–53.7)
Hemoglobin: 11.7 g/dL — AB (ref 14.1–18.1)
LYMPH, POC: 1.6 (ref 0.6–3.4)
MCH, POC: 31.9 pg — AB (ref 27–31.2)
MCHC: 33.6 g/dL (ref 31.8–35.4)
MCV: 94.9 fL (ref 80–97)
MPV: 8.6 fL (ref 0–99.8)
POC GRANULOCYTE: 4.1 (ref 2–6.9)
POC LYMPH PERCENT: 26.8 %L (ref 10–50)
Platelet Count, POC: 201 10*3/uL (ref 142–424)
RBC: 3.7 M/uL — AB (ref 4.69–6.13)
RDW, POC: 13.3 %
WBC: 6.1 10*3/uL (ref 4.6–10.2)

## 2013-11-30 ENCOUNTER — Other Ambulatory Visit: Payer: Self-pay | Admitting: Family Medicine

## 2013-12-29 ENCOUNTER — Other Ambulatory Visit: Payer: Self-pay | Admitting: Family Medicine

## 2014-01-09 ENCOUNTER — Other Ambulatory Visit: Payer: Self-pay | Admitting: Family Medicine

## 2014-01-14 ENCOUNTER — Other Ambulatory Visit: Payer: Self-pay | Admitting: Family Medicine

## 2014-01-29 ENCOUNTER — Ambulatory Visit (INDEPENDENT_AMBULATORY_CARE_PROVIDER_SITE_OTHER): Payer: Medicare Other | Admitting: Family Medicine

## 2014-01-29 ENCOUNTER — Encounter: Payer: Self-pay | Admitting: Family Medicine

## 2014-01-29 VITALS — BP 118/64 | HR 78 | Temp 99.1°F | Ht 64.0 in | Wt 204.0 lb

## 2014-01-29 DIAGNOSIS — E785 Hyperlipidemia, unspecified: Secondary | ICD-10-CM

## 2014-01-29 DIAGNOSIS — I10 Essential (primary) hypertension: Secondary | ICD-10-CM | POA: Diagnosis not present

## 2014-01-29 DIAGNOSIS — F411 Generalized anxiety disorder: Secondary | ICD-10-CM

## 2014-01-29 DIAGNOSIS — D509 Iron deficiency anemia, unspecified: Secondary | ICD-10-CM | POA: Diagnosis not present

## 2014-01-29 DIAGNOSIS — E119 Type 2 diabetes mellitus without complications: Secondary | ICD-10-CM | POA: Diagnosis not present

## 2014-01-29 DIAGNOSIS — E559 Vitamin D deficiency, unspecified: Secondary | ICD-10-CM

## 2014-01-29 DIAGNOSIS — F3289 Other specified depressive episodes: Secondary | ICD-10-CM

## 2014-01-29 DIAGNOSIS — F329 Major depressive disorder, single episode, unspecified: Secondary | ICD-10-CM

## 2014-01-29 LAB — POCT CBC
Granulocyte percent: 67.4 %G (ref 37–80)
HCT, POC: 36.8 % — AB (ref 43.5–53.7)
Hemoglobin: 12.1 g/dL — AB (ref 14.1–18.1)
LYMPH, POC: 1.8 (ref 0.6–3.4)
MCH: 32 pg — AB (ref 27–31.2)
MCHC: 32.8 g/dL (ref 31.8–35.4)
MCV: 97.6 fL — AB (ref 80–97)
MPV: 8.1 fL (ref 0–99.8)
PLATELET COUNT, POC: 207 10*3/uL (ref 142–424)
POC Granulocyte: 5 (ref 2–6.9)
POC LYMPH %: 23.8 % (ref 10–50)
RBC: 3.8 M/uL — AB (ref 4.69–6.13)
RDW, POC: 12.8 %
WBC: 7.4 10*3/uL (ref 4.6–10.2)

## 2014-01-29 LAB — POCT GLYCOSYLATED HEMOGLOBIN (HGB A1C): Hemoglobin A1C: 6

## 2014-01-29 NOTE — Progress Notes (Signed)
Subjective:    Patient ID: George Wang, male    DOB: 1946/06/19, 68 y.o.   MRN: [id number]  HPI Pt here for follow up and management of chronic medical problems. The patient continues to have some neck and back pain he does have degenerative disc disease with neuropathy. He has seen a neurosurgeon in the past. The patient tomorrow overall his demeanor is positive today. He continues to see Dr. Jeffie Pollock yearly for his prostate cancer followup.       Patient Active Problem List   Diagnosis Date Noted  . Hypertension 02/28/2013  . Hyperlipemia 02/28/2013  . Type 2 diabetes mellitus 02/28/2013  . Vitamin D deficiency 02/28/2013  . Overweight 10/13/2011  . PVC (premature ventricular contraction) 10/13/2011  . HEMOCCULT POSITIVE STOOL 08/14/2008  . ANEMIA, IRON DEFICIENCY 06/27/2008  . GOUT 04/25/2008  . ANXIETY 04/25/2008  . DEPRESSION 04/25/2008  . DIVERTICULOSIS, COLON 04/25/2008  . IRRITABLE BOWEL SYNDROME 04/25/2008  . NEPHROLITHIASIS 04/25/2008  . PROSTATE CANCER, HX OF 04/25/2008   Outpatient Encounter Prescriptions as of 01/29/2014  Medication Sig  . amLODipine (NORVASC) 10 MG tablet TAKE ONE (1) TABLET EACH DAY  . atorvastatin (LIPITOR) 80 MG tablet TAKE ONE (1) TABLET EACH DAY  . benazepril (LOTENSIN) 20 MG tablet TAKE ONE (1) TABLET EACH DAY  . beta carotene 25000 UNIT capsule Take 25,000 Units by mouth daily.  . Cholecalciferol (VITAMIN D) 2000 UNITS CAPS Take 1 capsule by mouth daily.  . clonazePAM (KLONOPIN) 0.5 MG tablet Take 0.5 tablets (0.25 mg total) by mouth 2 (two) times daily as needed.  . FeFum-FePo-FA-B Cmp-C-Zn-Mn-Cu (SE-TAN PLUS) 162-115.2-1 MG CAPS TAKE ONE CAPSULE BY MOUTH TWICE A DAY  . fish oil-omega-3 fatty acids 1000 MG capsule Take 2 g by mouth daily.  Marland Kitchen glimepiride (AMARYL) 4 MG tablet TAKE 1/2 TABLET TWICE DAILY  . losartan-hydrochlorothiazide (HYZAAR) 100-25 MG per tablet TAKE ONE (1) TABLET EACH DAY  . metFORMIN (GLUCOPHAGE) 500 MG tablet  TAKE ONE TABLET TWICE A DAY WITH FOOD  . Multiple Vitamins-Minerals (CENTRUM SILVER PO) Take by mouth.  Marland Kitchen omeprazole (PRILOSEC) 20 MG capsule TAKE ONE (1) CAPSULE EACH DAY  . PARoxetine (PAXIL) 40 MG tablet TAKE ONE (1) TABLET EACH DAY  . pioglitazone (ACTOS) 30 MG tablet Take 30 mg by mouth daily.  . [DISCONTINUED] glimepiride (AMARYL) 4 MG tablet Take 1/2 tab bid  . [DISCONTINUED] hydrOXYzine (ATARAX/VISTARIL) 25 MG tablet Take 25 mg by mouth 3 (three) times daily as needed.  . [DISCONTINUED] lamoTRIgine (LAMICTAL) 25 MG tablet Take 100 mg by mouth daily.  . [DISCONTINUED] PARoxetine (PAXIL) 20 MG tablet Take 2 tablets (40 mg total) by mouth every morning.    Review of Systems  Constitutional: Negative.   HENT: Negative.   Eyes: Negative.   Respiratory: Negative.   Cardiovascular: Negative.   Gastrointestinal: Negative.   Endocrine: Negative.   Genitourinary: Negative.   Musculoskeletal: Positive for back pain and neck pain.       Seen Earnie Larsson in the past  Skin: Negative.   Allergic/Immunologic: Negative.   Neurological: Negative.   Hematological: Negative.   Psychiatric/Behavioral: Negative.        Objective:   Physical Exam  Nursing note and vitals reviewed. Constitutional: He is oriented to person, place, and time. He appears well-developed and well-nourished. No distress.  Patient is pleasant and appears much calmer than usual  HENT:  Head: Normocephalic and atraumatic.  Right Ear: External ear normal.  Left Ear: External ear normal.  Mouth/Throat: Oropharynx is clear and moist. No oropharyngeal exudate.  Nasal congestion bilateral  Eyes: Conjunctivae and EOM are normal. Pupils are equal, round, and reactive to light. Right eye exhibits no discharge. Left eye exhibits no discharge. No scleral icterus.  Neck: Normal range of motion. Neck supple. No thyromegaly present.  No carotid bruits  Cardiovascular: Normal rate, regular rhythm, normal heart sounds and intact  distal pulses.  Exam reveals no gallop and no friction rub.   No murmur heard. At 72 per minute  Pulmonary/Chest: Effort normal and breath sounds normal. No respiratory distress. He has no wheezes. He has no rales. He exhibits no tenderness.  Abdominal: Soft. Bowel sounds are normal. He exhibits no mass. There is no tenderness. There is no rebound and no guarding.  Obesity, no abdominal bruits or tenderness  Genitourinary:  This exam is done by the urologist yearly  Musculoskeletal: Normal range of motion. He exhibits no edema and no tenderness.  Lymphadenopathy:    He has no cervical adenopathy.  Neurological: He is alert and oriented to person, place, and time. He has normal reflexes.  Skin: Skin is warm and dry. No rash noted. No erythema. No pallor.  Psychiatric: He has a normal mood and affect. His behavior is normal. Judgment and thought content normal.   BP 118/64  Pulse 78  Temp(Src) 99.1 F (37.3 C) (Oral)  Ht 5' 4" (1.626 m)  Wt 204 lb (92.534 kg)  BMI 35.00 kg/m2        Assessment & Plan:  1. ANEMIA, IRON DEFICIENCY - POCT CBC  2. Hyperlipemia - POCT CBC - NMR, lipoprofile  3. Essential hypertension - POCT CBC - BMP8+EGFR - Hepatic function panel  4. Vitamin D deficiency - POCT CBC - Vit D  25 hydroxy (rtn osteoporosis monitoring)  5. Type 2 diabetes mellitus without complication - POCT CBC - POCT glycosylated hemoglobin (Hb A1C)  6. ANXIETY  7. DEPRESSION  No orders of the defined types were placed in this encounter.   Patient Instructions                       Medicare Annual Wellness Visit  [facility] and the medical providers at [facility] strive to bring you the best medical care.  In doing so we not only want to address your current medical conditions and concerns but also to detect new conditions early and prevent illness, disease and health-related problems.    Medicare offers a yearly Wellness Visit which allows  our clinical staff to assess your need for preventative services including immunizations, lifestyle education, counseling to decrease risk of preventable diseases and screening for fall risk and other medical concerns.    This visit is provided free of charge (no copay) for all Medicare recipients. The clinical pharmacists at [facility] have begun to conduct these Wellness Visits which will also include a thorough review of all your medications.    As you primary medical provider recommend that you make an appointment for your Annual Wellness Visit if you have not done so already this year.  You may set up this appointment before you leave today or you may call back ([phone]) and schedule an appointment.  Please make sure when you call that you mention that you are scheduling your Annual Wellness Visit with the clinical pharmacist so that the appointment may be made for the proper length of time.     Continue current medications. Continue  good therapeutic lifestyle changes which include good diet and exercise. Fall precautions discussed with patient. If an FOBT was given today- please return it to our front desk. If you are over 50 years old - you may need Prevnar 13 or the adult Pneumonia vaccine.  Try to get as much exercise as possible Drink plenty of water Check blood sugars and blood pressures as often as possible, i.e. 3-4 times monthly at least. We will call you with your lab results as soon as possible   Don W. Moore MD   

## 2014-01-29 NOTE — Patient Instructions (Addendum)
Medicare Annual Wellness Visit  [facility] and the medical providers at [facility] strive to bring you the best medical care.  In doing so we not only want to address your current medical conditions and concerns but also to detect new conditions early and prevent illness, disease and health-related problems.    Medicare offers a yearly Wellness Visit which allows our clinical staff to assess your need for preventative services including immunizations, lifestyle education, counseling to decrease risk of preventable diseases and screening for fall risk and other medical concerns.    This visit is provided free of charge (no copay) for all Medicare recipients. The clinical pharmacists at [facility] have begun to conduct these Wellness Visits which will also include a thorough review of all your medications.    As you primary medical provider recommend that you make an appointment for your Annual Wellness Visit if you have not done so already this year.  You may set up this appointment before you leave today or you may call back ([phone]) and schedule an appointment.  Please make sure when you call that you mention that you are scheduling your Annual Wellness Visit with the clinical pharmacist so that the appointment may be made for the proper length of time.     Continue current medications. Continue good therapeutic lifestyle changes which include good diet and exercise. Fall precautions discussed with patient. If an FOBT was given today- please return it to our front desk. If you are over 74 years old - you may need Prevnar 14 or the adult Pneumonia vaccine.  Try to get as much exercise as possible Drink plenty of water Check blood sugars and blood pressures as often as possible, i.e. 3-4 times monthly at least. We will call you with your lab results as soon as possible

## 2014-01-30 ENCOUNTER — Other Ambulatory Visit: Payer: Self-pay | Admitting: Family Medicine

## 2014-01-30 LAB — NMR, LIPOPROFILE
Cholesterol: 112 mg/dL (ref 100–199)
HDL CHOLESTEROL BY NMR: 37 mg/dL — AB (ref >39)
HDL Particle Number: 28.7 umol/L — ABNORMAL LOW (ref >=30.5)
LDL Particle Number: 948 nmol/L (ref <1000)
LDL Size: 20.6 nm (ref >20.5)
LDLC SERPL CALC-MCNC: 61 mg/dL (ref 0–99)
LP-IR SCORE: 63 — AB (ref <=45)
SMALL LDL PARTICLE NUMBER: 435 nmol/L (ref <=527)
Triglycerides by NMR: 72 mg/dL (ref 0–149)

## 2014-01-30 LAB — HEPATIC FUNCTION PANEL
ALT: 19 IU/L (ref 0–44)
AST: 36 IU/L (ref 0–40)
Albumin: 4.5 g/dL (ref 3.6–4.8)
Alkaline Phosphatase: 76 IU/L (ref 39–117)
BILIRUBIN TOTAL: 0.9 mg/dL (ref 0.0–1.2)
Bilirubin, Direct: 0.23 mg/dL (ref 0.00–0.40)
Total Protein: 6.3 g/dL (ref 6.0–8.5)

## 2014-01-30 LAB — BMP8+EGFR
BUN / CREAT RATIO: 17 (ref 10–22)
BUN: 17 mg/dL (ref 8–27)
CHLORIDE: 100 mmol/L (ref 97–108)
CO2: 24 mmol/L (ref 18–29)
Calcium: 9.1 mg/dL (ref 8.6–10.2)
Creatinine, Ser: 1 mg/dL (ref 0.76–1.27)
GFR calc Af Amer: 88 mL/min/{1.73_m2} (ref >59)
GFR calc non Af Amer: 76 mL/min/{1.73_m2} (ref >59)
Glucose: 98 mg/dL (ref 65–99)
Potassium: 3.8 mmol/L (ref 3.5–5.2)
SODIUM: 143 mmol/L (ref 134–144)

## 2014-01-30 LAB — VITAMIN D 25 HYDROXY (VIT D DEFICIENCY, FRACTURES): VIT D 25 HYDROXY: 45.2 ng/mL (ref 30.0–100.0)

## 2014-02-04 ENCOUNTER — Other Ambulatory Visit: Payer: Self-pay | Admitting: Family Medicine

## 2014-02-08 ENCOUNTER — Other Ambulatory Visit: Payer: Self-pay | Admitting: Family Medicine

## 2014-02-11 ENCOUNTER — Other Ambulatory Visit: Payer: Self-pay | Admitting: Family Medicine

## 2014-03-13 ENCOUNTER — Other Ambulatory Visit: Payer: Self-pay | Admitting: Family Medicine

## 2014-04-01 ENCOUNTER — Other Ambulatory Visit: Payer: Self-pay | Admitting: Family Medicine

## 2014-04-12 ENCOUNTER — Other Ambulatory Visit: Payer: Self-pay | Admitting: Family Medicine

## 2014-04-27 ENCOUNTER — Other Ambulatory Visit: Payer: Self-pay | Admitting: Nurse Practitioner

## 2014-04-27 ENCOUNTER — Other Ambulatory Visit: Payer: Self-pay | Admitting: Family Medicine

## 2014-04-30 NOTE — Telephone Encounter (Signed)
This is okay to refill for 6 months 

## 2014-04-30 NOTE — Telephone Encounter (Signed)
Last filled 02/25/14, last seen 01/29/14. Route to pool A, [facility]

## 2014-05-01 NOTE — Telephone Encounter (Signed)
Called in.

## 2014-05-02 ENCOUNTER — Ambulatory Visit (INDEPENDENT_AMBULATORY_CARE_PROVIDER_SITE_OTHER): Payer: Medicare Other | Admitting: *Deleted

## 2014-05-02 DIAGNOSIS — H5203 Hypermetropia, bilateral: Secondary | ICD-10-CM | POA: Diagnosis not present

## 2014-05-02 DIAGNOSIS — Z23 Encounter for immunization: Secondary | ICD-10-CM

## 2014-05-02 DIAGNOSIS — H52223 Regular astigmatism, bilateral: Secondary | ICD-10-CM | POA: Diagnosis not present

## 2014-05-02 DIAGNOSIS — E119 Type 2 diabetes mellitus without complications: Secondary | ICD-10-CM | POA: Diagnosis not present

## 2014-05-02 DIAGNOSIS — H2513 Age-related nuclear cataract, bilateral: Secondary | ICD-10-CM | POA: Diagnosis not present

## 2014-05-14 ENCOUNTER — Other Ambulatory Visit: Payer: Self-pay | Admitting: Family Medicine

## 2014-05-30 ENCOUNTER — Ambulatory Visit (INDEPENDENT_AMBULATORY_CARE_PROVIDER_SITE_OTHER): Payer: Medicare Other | Admitting: Family Medicine

## 2014-05-30 ENCOUNTER — Encounter: Payer: Self-pay | Admitting: Family Medicine

## 2014-05-30 VITALS — BP 136/67 | HR 79 | Temp 97.2°F | Ht 64.0 in | Wt 206.0 lb

## 2014-05-30 DIAGNOSIS — M4722 Other spondylosis with radiculopathy, cervical region: Secondary | ICD-10-CM

## 2014-05-30 DIAGNOSIS — Z8546 Personal history of malignant neoplasm of prostate: Secondary | ICD-10-CM | POA: Diagnosis not present

## 2014-05-30 DIAGNOSIS — E785 Hyperlipidemia, unspecified: Secondary | ICD-10-CM | POA: Diagnosis not present

## 2014-05-30 DIAGNOSIS — F329 Major depressive disorder, single episode, unspecified: Secondary | ICD-10-CM

## 2014-05-30 DIAGNOSIS — R5383 Other fatigue: Secondary | ICD-10-CM

## 2014-05-30 DIAGNOSIS — D509 Iron deficiency anemia, unspecified: Secondary | ICD-10-CM

## 2014-05-30 DIAGNOSIS — I10 Essential (primary) hypertension: Secondary | ICD-10-CM

## 2014-05-30 DIAGNOSIS — M542 Cervicalgia: Secondary | ICD-10-CM

## 2014-05-30 DIAGNOSIS — E291 Testicular hypofunction: Secondary | ICD-10-CM | POA: Diagnosis not present

## 2014-05-30 DIAGNOSIS — M4726 Other spondylosis with radiculopathy, lumbar region: Secondary | ICD-10-CM

## 2014-05-30 DIAGNOSIS — E119 Type 2 diabetes mellitus without complications: Secondary | ICD-10-CM | POA: Diagnosis not present

## 2014-05-30 DIAGNOSIS — I493 Ventricular premature depolarization: Secondary | ICD-10-CM

## 2014-05-30 DIAGNOSIS — F32A Depression, unspecified: Secondary | ICD-10-CM

## 2014-05-30 DIAGNOSIS — E559 Vitamin D deficiency, unspecified: Secondary | ICD-10-CM

## 2014-05-30 DIAGNOSIS — E349 Endocrine disorder, unspecified: Secondary | ICD-10-CM

## 2014-05-30 LAB — POCT CBC
Granulocyte percent: 68 %G (ref 37–80)
HEMATOCRIT: 36.9 % — AB (ref 43.5–53.7)
HEMOGLOBIN: 12 g/dL — AB (ref 14.1–18.1)
Lymph, poc: 1.6 (ref 0.6–3.4)
MCH: 31.5 pg — AB (ref 27–31.2)
MCHC: 32.6 g/dL (ref 31.8–35.4)
MCV: 96.5 fL (ref 80–97)
MPV: 8.3 fL (ref 0–99.8)
POC GRANULOCYTE: 4.3 (ref 2–6.9)
POC LYMPH %: 26 % (ref 10–50)
Platelet Count, POC: 218 10*3/uL (ref 142–424)
RBC: 3.8 M/uL — AB (ref 4.69–6.13)
RDW, POC: 13 %
WBC: 6.3 10*3/uL (ref 4.6–10.2)

## 2014-05-30 LAB — POCT GLYCOSYLATED HEMOGLOBIN (HGB A1C): Hemoglobin A1C: 5.8

## 2014-05-30 NOTE — Patient Instructions (Addendum)
Medicare Annual Wellness Visit  [facility] and the medical providers at [facility] strive to bring you the best medical care.  In doing so we not only want to address your current medical conditions and concerns but also to detect new conditions early and prevent illness, disease and health-related problems.    Medicare offers a yearly Wellness Visit which allows our clinical staff to assess your need for preventative services including immunizations, lifestyle education, counseling to decrease risk of preventable diseases and screening for fall risk and other medical concerns.    This visit is provided free of charge (no copay) for all Medicare recipients. The clinical pharmacists at [facility] have begun to conduct these Wellness Visits which will also include a thorough review of all your medications.    As you primary medical provider recommend that you make an appointment for your Annual Wellness Visit if you have not done so already this year.  You may set up this appointment before you leave today or you may call back ([phone]) and schedule an appointment.  Please make sure when you call that you mention that you are scheduling your Annual Wellness Visit with the clinical pharmacist so that the appointment may be made for the proper length of time.     Continue current medications. Continue good therapeutic lifestyle changes which include good diet and exercise. Fall precautions discussed with patient. If an FOBT was given today- please return it to our front desk. If you are over 89 years old - you may need Prevnar 13 or the adult Pneumonia vaccine.  Flu Shots will be available at our office starting mid- September. Please call and schedule a FLU CLINIC APPOINTMENT.   Continue to watch the caffeine in your diet If the heart irregularity gets worse please call us back and we will pursue this further Continue to  refrain from lifting twisting movements with your spine. If the neuropathy in your legs get worse we can get another MRI and have the neurosurgeon to see you again to see if more injections might be helpful Stay active, but avoid aggravating factors for your neck and back Take extra strength Tylenol as needed for pain, avoid NSAIDs

## 2014-05-30 NOTE — Progress Notes (Signed)
Subjective:    Patient ID: George Wang, male    DOB: 1946-05-26, 68 y.o.   MRN: [id number]  HPI Pt here for follow up and management of chronic medical problems. The patient complains of fatigue, insomnia and continued back and neck pain. He is due to get lab work today and he is followed regularly by the urologist for his prostate and PSA. He does not need any refills.         Patient Active Problem List   Diagnosis Date Noted  . Hypertension 02/28/2013  . Hyperlipemia 02/28/2013  . Type 2 diabetes mellitus 02/28/2013  . Vitamin D deficiency 02/28/2013  . Overweight 10/13/2011  . PVC (premature ventricular contraction) 10/13/2011  . HEMOCCULT POSITIVE STOOL 08/14/2008  . ANEMIA, IRON DEFICIENCY 06/27/2008  . GOUT 04/25/2008  . ANXIETY 04/25/2008  . DEPRESSION 04/25/2008  . DIVERTICULOSIS, COLON 04/25/2008  . IRRITABLE BOWEL SYNDROME 04/25/2008  . NEPHROLITHIASIS 04/25/2008  . PROSTATE CANCER, HX OF 04/25/2008   Outpatient Encounter Prescriptions as of 05/30/2014  Medication Sig  . amLODipine (NORVASC) 10 MG tablet TAKE ONE (1) TABLET EACH DAY  . atorvastatin (LIPITOR) 80 MG tablet TAKE ONE (1) TABLET EACH DAY  . benazepril (LOTENSIN) 20 MG tablet TAKE ONE (1) TABLET EACH DAY  . beta carotene 25000 UNIT capsule Take 25,000 Units by mouth daily.  . Cholecalciferol (VITAMIN D) 2000 UNITS CAPS Take 1 capsule by mouth daily.  . clonazePAM (KLONOPIN) 0.5 MG tablet TAKE ONE TABLET TWICE DAILY AS NEEDED  . FeFum-FePo-FA-B Cmp-C-Zn-Mn-Cu (SE-TAN PLUS) 162-115.2-1 MG CAPS TAKE ONE CAPSULE BY MOUTH TWICE A DAY  . fish oil-omega-3 fatty acids 1000 MG capsule Take 2 g by mouth daily.  Marland Kitchen glimepiride (AMARYL) 4 MG tablet TAKE 1/2 TABLET TWICE DAILY  . losartan-hydrochlorothiazide (HYZAAR) 100-25 MG per tablet TAKE ONE (1) TABLET EACH DAY  . metFORMIN (GLUCOPHAGE) 500 MG tablet TAKE ONE TABLET TWICE A DAY WITH FOOD  . Multiple Vitamins-Minerals (CENTRUM SILVER PO) Take by  mouth.  Marland Kitchen omeprazole (PRILOSEC) 20 MG capsule TAKE ONE (1) CAPSULE EACH DAY  . PARoxetine (PAXIL) 40 MG tablet TAKE ONE (1) TABLET EACH DAY  . [DISCONTINUED] pioglitazone (ACTOS) 30 MG tablet TAKE 1 TABLET ONCE A DAY    Review of Systems  Constitutional: Positive for fatigue.       Does not sleep well  HENT: Negative.   Eyes: Negative.   Respiratory: Negative.   Cardiovascular: Negative.   Gastrointestinal: Negative.   Endocrine: Negative.   Genitourinary: Negative.   Musculoskeletal: Positive for back pain and neck pain.  Skin: Negative.   Allergic/Immunologic: Negative.   Neurological: Negative.   Hematological: Negative.   Psychiatric/Behavioral: Negative.        Objective:   Physical Exam  Constitutional: He is oriented to person, place, and time. He appears well-developed and well-nourished. No distress.  HENT:  Head: Normocephalic and atraumatic.  Right Ear: External ear normal.  Left Ear: External ear normal.  Nose: Nose normal.  Mouth/Throat: Oropharynx is clear and moist. No oropharyngeal exudate.  Eyes: Conjunctivae and EOM are normal. Pupils are equal, round, and reactive to light. Right eye exhibits no discharge. Left eye exhibits no discharge. No scleral icterus.  Neck: Normal range of motion. Neck supple. No tracheal deviation present. No thyromegaly present.  No carotid bruits  Cardiovascular: Normal rate, normal heart sounds and intact distal pulses.  Exam reveals no gallop and no friction rub.   No murmur heard. At 72/m and slight irregularity  Pulmonary/Chest: Effort normal and breath sounds normal. No respiratory distress. He has no wheezes. He has no rales. He exhibits no tenderness.  Lungs are clear anteriorly and posteriorly and there is no axillary adenopathy  Abdominal: Soft. Bowel sounds are normal. He exhibits no mass. There is no tenderness. There is no rebound and no guarding.  Musculoskeletal: Normal range of motion. He exhibits no edema.    Lymphadenopathy:    He has no cervical adenopathy.  Neurological: He is alert and oriented to person, place, and time. He has normal reflexes. No cranial nerve deficit.  Skin: Skin is warm and dry. No rash noted. No erythema. No pallor.  Psychiatric: He has a normal mood and affect. His behavior is normal. Judgment and thought content normal.  Nursing note and vitals reviewed.  BP 136/67 mmHg  Pulse 79  Temp(Src) 97.2 F (36.2 C) (Oral)  Ht _0  (1.626 m)  Wt 206 lb (93.441 kg)  BMI 35.34 kg/m2        Assessment & Plan:  1. Hyperlipemia - POCT CBC - NMR, lipoprofile  2. Essential hypertension - POCT CBC - BMP8+EGFR - Hepatic function panel  3. Vitamin D deficiency - POCT CBC - Vit D  25 hydroxy (rtn osteoporosis monitoring)  4. Type 2 diabetes mellitus without complication - POCT CBC - POCT glycosylated hemoglobin (Hb A1C)  5. Testosterone deficiency - POCT CBC  6. PROSTATE CANCER, HX OF - POCT CBC  7. Other fatigue - Thyroid Panel With TSH - Testosterone,Free and Total  8. Iron deficiency anemia  9. Depression  10. Neck pain, bilateral  11. Osteoarthritis of spine with radiculopathy, lumbar region  12. Osteoarthritis of spine with radiculopathy, cervical region  13. PVC's (premature ventricular contractions)   No orders of the defined types were placed in this encounter.   Patient Instructions                       Medicare Annual Wellness Visit  [name] and the medical providers at [facility] strive to bring you the best medical care.  In doing so we not only want to address your current medical conditions and concerns but also to detect new conditions early and prevent illness, disease and health-related problems.    Medicare offers a yearly Wellness Visit which allows our clinical staff to assess your need for preventative services including immunizations, lifestyle education, counseling to decrease risk of  preventable diseases and screening for fall risk and other medical concerns.    This visit is provided free of charge (no copay) for all Medicare recipients. The clinical pharmacists at [facility] have begun to conduct these Wellness Visits which will also include a thorough review of all your medications.    As you primary medical provider recommend that you make an appointment for your Annual Wellness Visit if you have not done so already this year.  You may set up this appointment before you leave today or you may call back ([phone]) and schedule an appointment.  Please make sure when you call that you mention that you are scheduling your Annual Wellness Visit with the clinical pharmacist so that the appointment may be made for the proper length of time.     Continue current medications. Continue good therapeutic lifestyle changes which include good diet and exercise. Fall precautions discussed with patient. If an FOBT was given today- please return it to our front desk. If you are over  75 years old - you may need Prevnar 28 or the adult Pneumonia vaccine.  Flu Shots will be available at our office starting mid- September. Please call and schedule a FLU CLINIC APPOINTMENT.   Continue to watch the caffeine in your diet If the heart irregularity gets worse please call us back and we will pursue this further Continue to refrain from lifting twisting movements with your spine. If the neuropathy in your legs get worse we can get another MRI and have the neurosurgeon to see you again to see if more injections might be helpful Stay active, but avoid aggravating factors for your neck and back Take extra strength Tylenol as needed for pain, avoid NSAIDs   Arrie Senate MD

## 2014-05-31 LAB — BMP8+EGFR
BUN / CREAT RATIO: 11 (ref 10–22)
BUN: 11 mg/dL (ref 8–27)
CO2: 25 mmol/L (ref 18–29)
CREATININE: 0.99 mg/dL (ref 0.76–1.27)
Calcium: 9.3 mg/dL (ref 8.6–10.2)
Chloride: 99 mmol/L (ref 97–108)
GFR calc Af Amer: 89 mL/min/{1.73_m2} (ref >59)
GFR, EST NON AFRICAN AMERICAN: 77 mL/min/{1.73_m2} (ref >59)
Glucose: 92 mg/dL (ref 65–99)
POTASSIUM: 3.3 mmol/L — AB (ref 3.5–5.2)
Sodium: 144 mmol/L (ref 134–144)

## 2014-05-31 LAB — HEPATIC FUNCTION PANEL
ALT: 17 IU/L (ref 0–44)
AST: 29 IU/L (ref 0–40)
Albumin: 4.4 g/dL (ref 3.5–4.8)
Alkaline Phosphatase: 70 IU/L (ref 39–117)
BILIRUBIN TOTAL: 0.9 mg/dL (ref 0.0–1.2)
Bilirubin, Direct: 0.25 mg/dL (ref 0.00–0.40)
Total Protein: 6.5 g/dL (ref 6.0–8.5)

## 2014-05-31 LAB — NMR, LIPOPROFILE
Cholesterol: 111 mg/dL (ref 100–199)
HDL Cholesterol by NMR: 42 mg/dL (ref >39)
HDL Particle Number: 27.9 umol/L — ABNORMAL LOW (ref >=30.5)
LDL PARTICLE NUMBER: 896 nmol/L (ref <1000)
LDL Size: 20.3 nm (ref >20.5)
LDL-C: 56 mg/dL (ref 0–99)
LP-IR SCORE: 39 (ref <=45)
SMALL LDL PARTICLE NUMBER: 512 nmol/L (ref <=527)
Triglycerides by NMR: 65 mg/dL (ref 0–149)

## 2014-05-31 LAB — VITAMIN D 25 HYDROXY (VIT D DEFICIENCY, FRACTURES): Vit D, 25-Hydroxy: 50.1 ng/mL (ref 30.0–100.0)

## 2014-05-31 LAB — THYROID PANEL WITH TSH
Free Thyroxine Index: 2.5 (ref 1.2–4.9)
T3 Uptake Ratio: 27 % (ref 24–39)
T4, Total: 9.4 ug/dL (ref 4.5–12.0)
TSH: 0.948 u[IU]/mL (ref 0.450–4.500)

## 2014-05-31 LAB — TESTOSTERONE,FREE AND TOTAL
Testosterone, Free: 0.8 pg/mL — ABNORMAL LOW (ref 6.6–18.1)
Testosterone: 51 ng/dL — ABNORMAL LOW (ref 348–1197)

## 2014-06-03 ENCOUNTER — Other Ambulatory Visit: Payer: Self-pay | Admitting: Family Medicine

## 2014-06-03 ENCOUNTER — Telehealth: Payer: Self-pay | Admitting: Family Medicine

## 2014-06-03 NOTE — Telephone Encounter (Signed)
-----   Message from Chipper Herb, MD sent at 05/31/2014  8:13 PM EST ----- The blood sugar is good at 92. The creatinine, the most important kidney function test is within normal limits. Serum potassium is slightly decreased at 3.3. The remainder of the electrolytes are within normal limits.------- please have the patient come by sometime next week and repeat the potassium. All liver function tests are within normal limits With advanced lipid testing, the total LDL particle number is good at goal at 896. The LDL C is good at 56. Triglycerides are good at 65. The HDL particle number are the good cholesterol remains low as it has been in the past.------ continue with current treatment and with as aggressive therapeutic lifestyle changes as possible which include diet and exercise The vitamin D level is good at 50.1, continue current treatment All thyroid function tests are within normal limits The total testosterone and the free direct testosterone levels remain low.++++++++++ please fax a copy of this report to Dr. Irine Seal, this is very important and have the patient take a copy of the lab work results with him when he goes to see Dr. Jeffie Pollock.

## 2014-06-07 NOTE — Telephone Encounter (Signed)
-----   Message from Chipper Herb, MD sent at 05/31/2014  8:13 PM EST ----- The blood sugar is good at 92. The creatinine, the most important kidney function test is within normal limits. Serum potassium is slightly decreased at 3.3. The remainder of the electrolytes are within normal limits.------- please have the patient come by sometime next week and repeat the potassium. All liver function tests are within normal limits With advanced lipid testing, the total LDL particle number is good at goal at 896. The LDL C is good at 56. Triglycerides are good at 65. The HDL particle number are the good cholesterol remains low as it has been in the past.------ continue with current treatment and with as aggressive therapeutic lifestyle changes as possible which include diet and exercise The vitamin D level is good at 50.1, continue current treatment All thyroid function tests are within normal limits The total testosterone and the free direct testosterone levels remain low.++++++++++ please fax a copy of this report to Dr. Irine Seal, this is very important and have the patient take a copy of the lab work results with him when he goes to see Dr. Jeffie Pollock.

## 2014-06-07 NOTE — Telephone Encounter (Signed)
Pt aware of results and the need for repeat Potassium level. He will RTC next week for this

## 2014-06-07 NOTE — Telephone Encounter (Signed)
Labs faxed to Dr. Jeffie Pollock

## 2014-06-11 ENCOUNTER — Other Ambulatory Visit: Payer: Self-pay | Admitting: Family Medicine

## 2014-06-11 ENCOUNTER — Other Ambulatory Visit (INDEPENDENT_AMBULATORY_CARE_PROVIDER_SITE_OTHER): Payer: Medicare Other

## 2014-06-11 DIAGNOSIS — E876 Hypokalemia: Secondary | ICD-10-CM

## 2014-06-11 NOTE — Progress Notes (Signed)
Lab only 

## 2014-06-12 LAB — POTASSIUM: POTASSIUM: 3.7 mmol/L (ref 3.5–5.2)

## 2014-06-25 ENCOUNTER — Other Ambulatory Visit (INDEPENDENT_AMBULATORY_CARE_PROVIDER_SITE_OTHER): Payer: Medicare Other

## 2014-06-25 DIAGNOSIS — Z8546 Personal history of malignant neoplasm of prostate: Secondary | ICD-10-CM | POA: Diagnosis not present

## 2014-06-25 NOTE — Progress Notes (Signed)
Lab only 

## 2014-06-26 LAB — PSA, TOTAL AND FREE
PSA, Free: 0.01 ng/mL
PSA: <0.1 ng/mL (ref 0.0–4.0)

## 2014-07-04 DIAGNOSIS — Z8546 Personal history of malignant neoplasm of prostate: Secondary | ICD-10-CM | POA: Diagnosis not present

## 2014-07-04 DIAGNOSIS — E291 Testicular hypofunction: Secondary | ICD-10-CM | POA: Diagnosis not present

## 2014-07-04 DIAGNOSIS — N393 Stress incontinence (female) (male): Secondary | ICD-10-CM | POA: Diagnosis not present

## 2014-07-04 DIAGNOSIS — N5201 Erectile dysfunction due to arterial insufficiency: Secondary | ICD-10-CM | POA: Diagnosis not present

## 2014-07-10 ENCOUNTER — Other Ambulatory Visit: Payer: Self-pay | Admitting: *Deleted

## 2014-07-10 MED ORDER — BENAZEPRIL HCL 20 MG PO TABS: ORAL_TABLET | ORAL | Status: DC

## 2014-07-22 ENCOUNTER — Encounter: Payer: Self-pay | Admitting: *Deleted

## 2014-07-29 ENCOUNTER — Other Ambulatory Visit: Payer: Self-pay | Admitting: Family Medicine

## 2014-07-31 ENCOUNTER — Encounter: Payer: Self-pay | Admitting: Family Medicine

## 2014-07-31 ENCOUNTER — Ambulatory Visit (INDEPENDENT_AMBULATORY_CARE_PROVIDER_SITE_OTHER): Payer: Medicare Other | Admitting: Family Medicine

## 2014-07-31 VITALS — BP 132/64 | HR 80 | Temp 97.5°F | Ht 64.0 in | Wt 213.2 lb

## 2014-07-31 DIAGNOSIS — J069 Acute upper respiratory infection, unspecified: Secondary | ICD-10-CM | POA: Diagnosis not present

## 2014-07-31 MED ORDER — AZITHROMYCIN 250 MG PO TABS: ORAL_TABLET | ORAL | Status: DC

## 2014-07-31 MED ORDER — HYDROCODONE-HOMATROPINE 5-1.5 MG/5ML PO SYRP: 5.0000 mL | ORAL_SOLUTION | Freq: Three times a day (TID) | ORAL | Status: DC | PRN

## 2014-07-31 NOTE — Progress Notes (Signed)
   Subjective:    Patient ID: George Wang, male    DOB: 05/02/1946, 69 y.o.   MRN: [id number]  HPI C/o uri sx's and severe cough  Review of Systems  Constitutional: Negative for fever.  HENT: Negative for ear pain.   Eyes: Negative for discharge.  Respiratory: Negative for cough.   Cardiovascular: Negative for chest pain.  Gastrointestinal: Negative for abdominal distention.  Endocrine: Negative for polyuria.  Genitourinary: Negative for difficulty urinating.  Musculoskeletal: Negative for gait problem and neck pain.  Skin: Negative for color change and rash.  Neurological: Negative for speech difficulty and headaches.  Psychiatric/Behavioral: Negative for agitation.       Objective:    BP 132/64 mmHg  Pulse 80  Temp(Src) 97.5 F (36.4 C) (Oral)  Ht 5\' 4"  (1.626 m)  Wt 213 lb 3.2 oz (96.707 kg)  BMI 36.58 kg/m2 Physical Exam  Constitutional: He is oriented to person, place, and time. He appears well-developed and well-nourished.  HENT:  Head: Normocephalic and atraumatic.  Mouth/Throat: Oropharynx is clear and moist.  Eyes: Pupils are equal, round, and reactive to light.  Neck: Normal range of motion. Neck supple.  Cardiovascular: Normal rate and regular rhythm.   No murmur heard. Pulmonary/Chest: Effort normal and breath sounds normal.  Abdominal: Soft. Bowel sounds are normal. There is no tenderness.  Neurological: He is alert and oriented to person, place, and time.  Skin: Skin is warm and dry.  Psychiatric: He has a normal mood and affect.          Assessment & Plan:     ICD-9-CM ICD-10-CM   1. URI (upper respiratory infection) 465.9 J06.9 azithromycin (ZITHROMAX) 250 MG tablet     HYDROcodone-homatropine (HYCODAN) 5-1.5 MG/5ML syrup     Return if symptoms worsen or fail to improve.  Lysbeth Penner FNP

## 2014-08-26 ENCOUNTER — Other Ambulatory Visit: Payer: Self-pay | Admitting: Family Medicine

## 2014-08-27 NOTE — Telephone Encounter (Signed)
Last seen 07/31/14  [facility] [place]  If approved route to nurse to call into [facility]  [phone]

## 2014-08-27 NOTE — Telephone Encounter (Signed)
Refill called to [facility] 

## 2014-09-09 ENCOUNTER — Other Ambulatory Visit: Payer: Self-pay | Admitting: Family Medicine

## 2014-09-30 ENCOUNTER — Other Ambulatory Visit: Payer: Self-pay | Admitting: Family Medicine

## 2014-10-09 ENCOUNTER — Other Ambulatory Visit: Payer: Self-pay | Admitting: Family Medicine

## 2014-10-15 DIAGNOSIS — Z08 Encounter for follow-up examination after completed treatment for malignant neoplasm: Secondary | ICD-10-CM | POA: Diagnosis not present

## 2014-10-15 DIAGNOSIS — L814 Other melanin hyperpigmentation: Secondary | ICD-10-CM | POA: Diagnosis not present

## 2014-10-15 DIAGNOSIS — D225 Melanocytic nevi of trunk: Secondary | ICD-10-CM | POA: Diagnosis not present

## 2014-10-15 DIAGNOSIS — Z85828 Personal history of other malignant neoplasm of skin: Secondary | ICD-10-CM | POA: Diagnosis not present

## 2014-10-15 DIAGNOSIS — L570 Actinic keratosis: Secondary | ICD-10-CM | POA: Diagnosis not present

## 2014-10-18 ENCOUNTER — Ambulatory Visit: Payer: Medicare Other | Admitting: Family Medicine

## 2014-10-22 ENCOUNTER — Encounter: Payer: Self-pay | Admitting: Family Medicine

## 2014-10-22 ENCOUNTER — Ambulatory Visit (INDEPENDENT_AMBULATORY_CARE_PROVIDER_SITE_OTHER): Payer: Medicare Other | Admitting: Family Medicine

## 2014-10-22 VITALS — BP 138/84 | HR 40 | Temp 98.2°F | Ht 64.0 in | Wt 208.0 lb

## 2014-10-22 DIAGNOSIS — E119 Type 2 diabetes mellitus without complications: Secondary | ICD-10-CM | POA: Diagnosis not present

## 2014-10-22 DIAGNOSIS — E785 Hyperlipidemia, unspecified: Secondary | ICD-10-CM | POA: Diagnosis not present

## 2014-10-22 DIAGNOSIS — I10 Essential (primary) hypertension: Secondary | ICD-10-CM | POA: Diagnosis not present

## 2014-10-22 DIAGNOSIS — M4726 Other spondylosis with radiculopathy, lumbar region: Secondary | ICD-10-CM | POA: Diagnosis not present

## 2014-10-22 DIAGNOSIS — M4722 Other spondylosis with radiculopathy, cervical region: Secondary | ICD-10-CM

## 2014-10-22 DIAGNOSIS — E559 Vitamin D deficiency, unspecified: Secondary | ICD-10-CM

## 2014-10-22 DIAGNOSIS — Z8546 Personal history of malignant neoplasm of prostate: Secondary | ICD-10-CM | POA: Diagnosis not present

## 2014-10-22 LAB — POCT GLYCOSYLATED HEMOGLOBIN (HGB A1C): HEMOGLOBIN A1C: 6.2

## 2014-10-22 LAB — POCT UA - MICROALBUMIN: Microalbumin Ur, POC: 20 mg/L

## 2014-10-22 NOTE — Patient Instructions (Addendum)
Medicare Annual Wellness Visit  [facility] and the medical providers at [facility] strive to bring you the best medical care.  In doing so we not only want to address your current medical conditions and concerns but also to detect new conditions early and prevent illness, disease and health-related problems.    Medicare offers a yearly Wellness Visit which allows our clinical staff to assess your need for preventative services including immunizations, lifestyle education, counseling to decrease risk of preventable diseases and screening for fall risk and other medical concerns.    This visit is provided free of charge (no copay) for all Medicare recipients. The clinical pharmacists at [facility] have begun to conduct these Wellness Visits which will also include a thorough review of all your medications.    As you primary medical provider recommend that you make an appointment for your Annual Wellness Visit if you have not done so already this year.  You may set up this appointment before you leave today or you may call back [phone]) and schedule an appointment.  Please make sure when you call that you mention that you are scheduling your Annual Wellness Visit with the clinical pharmacist so that the appointment may be made for the proper length of time.     Continue current medications. Continue good therapeutic lifestyle changes which include good diet and exercise. Fall precautions discussed with patient. If an FOBT was given today- please return it to our front desk. If you are over 3 years old - you may need Prevnar 63 or the adult Pneumonia vaccine.  Flu Shots are still available at our office. If you still haven't had one please call to set up a nurse visit to get one.   After your visit with [place] today you will receive a survey in the mail or online from [employer] regarding your care with [place]. Please take a moment to  fill this out. Your feedback is very important to [place] as you can help [place] better understand your patient needs as well as improve your experience and satisfaction. WE CARE ABOUT YOU!!!   The patient should continue to get as much physical activity as his neck and back will allow him to do. He should watch his sodium intake and he should check his blood sugars and blood pressures at home and bring these readings in for review when he comes to the office If the low back pain gets progressively worse, he should get in touch with [place], and we will call and set him up an appointment with the neurosurgeon that he is seen in the past for further follow-up Watch diet as closely as possible and drink more water

## 2014-10-22 NOTE — Progress Notes (Signed)
Subjective:    Patient ID: [facility] Wang, male    DOB: [birth year], 69 y.o.   MRN: [id number]  HPI Pt here for follow up and management of chronic medical problems which includes diabetes, hypertension, and hyperlipidemia. He is taking medications regularly. The patient complains of some headache. He continues to have both neck and low back pain and has a history of osteoarthritis in both of these regions. On health maintenance issues, he is due to get lab work today, be given an FOBT to return, and check a urine microalbumin. The patient denies chest pain shortness of breath or GI symptoms. He sees the urologist regularly and has seen him somewhere around the end of 2015. He is now 10 years post prostate cancer.          Patient Active Problem List   Diagnosis Date Noted  . Hypertension 02/28/2013  . Hyperlipemia 02/28/2013  . Type 2 diabetes mellitus 02/28/2013  . Vitamin D deficiency 02/28/2013  . Overweight 10/13/2011  . PVC (premature ventricular contraction) 10/13/2011  . HEMOCCULT POSITIVE STOOL 08/14/2008  . Iron deficiency anemia 06/27/2008  . GOUT 04/25/2008  . ANXIETY 04/25/2008  . Depression 04/25/2008  . DIVERTICULOSIS, COLON 04/25/2008  . IRRITABLE BOWEL SYNDROME 04/25/2008  . NEPHROLITHIASIS 04/25/2008  . PROSTATE CANCER, HX OF 04/25/2008   Outpatient Encounter Prescriptions as of 10/22/2014  Medication Sig  . amLODipine (NORVASC) 10 MG tablet TAKE ONE (1) TABLET EACH DAY  . atorvastatin (LIPITOR) 80 MG tablet TAKE ONE (1) TABLET EACH DAY  . benazepril (LOTENSIN) 20 MG tablet TAKE ONE (1) TABLET EACH DAY  . beta carotene 25000 UNIT capsule Take 25,000 Units by mouth daily.  . Cholecalciferol (VITAMIN D) 2000 UNITS CAPS Take 1 capsule by mouth daily.  . clonazePAM (KLONOPIN) 0.5 MG tablet TAKE ONE TABLET TWICE DAILY AS NEEDED  . FeFum-FePo-FA-B Cmp-C-Zn-Mn-Cu (SE-TAN PLUS) 162-115.2-1 MG CAPS TAKE ONE CAPSULE BY MOUTH TWICE A DAY  . fish oil-omega-3 fatty  acids 1000 MG capsule Take 2 g by mouth daily.  Marland Kitchen glimepiride (AMARYL) 4 MG tablet TAKE 1/2 TABLET TWICE DAILY  . HYDROcodone-homatropine (HYCODAN) 5-1.5 MG/5ML syrup Take 5 mLs by mouth every 8 (eight) hours as needed for cough.  . losartan-hydrochlorothiazide (HYZAAR) 100-25 MG per tablet TAKE ONE (1) TABLET EACH DAY  . metFORMIN (GLUCOPHAGE) 500 MG tablet TAKE ONE TABLET TWICE A DAY WITH FOOD  . Multiple Vitamins-Minerals (CENTRUM SILVER PO) Take by mouth.  Marland Kitchen omeprazole (PRILOSEC) 20 MG capsule TAKE ONE (1) CAPSULE EACH DAY  . PARoxetine (PAXIL) 40 MG tablet TAKE ONE (1) TABLET EACH DAY  . [DISCONTINUED] azithromycin (ZITHROMAX) 250 MG tablet Take 2 po first day and then one po qd x 4 days    Review of Systems  Constitutional: Negative.   Eyes: Negative.   Respiratory: Negative.   Cardiovascular: Negative.   Gastrointestinal: Negative.   Endocrine: Negative.   Genitourinary: Negative.   Musculoskeletal: Positive for back pain (low) and neck pain.  Skin: Negative.   Allergic/Immunologic: Negative.   Neurological: Positive for headaches.  Hematological: Negative.   Psychiatric/Behavioral: Negative.        Objective:   Physical Exam  Constitutional: He is oriented to person, place, and time. He appears well-developed and well-nourished. No distress.  The patient is alert and in good spirits.  HENT:  Head: Normocephalic and atraumatic.  Right Ear: External ear normal.  Left Ear: External ear normal.  Nose: Nose normal.  Mouth/Throat: Oropharynx is clear and moist.  No oropharyngeal exudate.  Eyes: Conjunctivae and EOM are normal. Pupils are equal, round, and reactive to light. Right eye exhibits no discharge. Left eye exhibits no discharge. No scleral icterus.  Neck: Normal range of motion. Neck supple. No thyromegaly present.  No carotid bruits thyromegaly or anterior cervical adenopathy  Cardiovascular: Normal rate, regular rhythm, normal heart sounds and intact distal  pulses.   No murmur heard. At 72/m  Pulmonary/Chest: Effort normal and breath sounds normal. No respiratory distress. He has no wheezes. He has no rales. He exhibits no tenderness.  Clear anteriorly and posteriorly and no axillary adenopathy  Abdominal: Soft. Bowel sounds are normal. He exhibits no mass. There is no tenderness. There is no rebound and no guarding.  Abdominal obesity without masses tenderness or organ enlargement  Genitourinary:  This exam was done by the urologist in December and he sees him yearly.  Musculoskeletal: Normal range of motion. He exhibits no edema.  Lymphadenopathy:    He has no cervical adenopathy.  Neurological: He is alert and oriented to person, place, and time. He has normal reflexes. No cranial nerve deficit.  Skin: Skin is warm and dry. No rash noted. No erythema. No pallor.  Psychiatric: He has a normal mood and affect. His behavior is normal. Judgment and thought content normal.  Nursing note and vitals reviewed.  BP 143/66 mmHg  Pulse 40  Temp(Src) 98.2 F (36.8 C) (Oral)  Ht 5' 4" (1.626 m)  Wt 208 lb (94.348 kg)  BMI 35.69 kg/m2        Assessment & Plan:  1. Hyperlipemia -Try to do better with diet and exercise and until lab work is returned continue with current treatment - NMR, lipoprofile - CBC with Differential/Platelet  2. Essential hypertension -Repeat blood pressure was good today but patient should monitor this at home and record readings and bring readings to the next office visit. He should continue to watch his sodium intake and work on losing weight and continue current treatment - BMP8+EGFR - Hepatic function panel - CBC with Differential/Platelet  3. Vitamin D deficiency -Continue with current treatment pending results of lab work - Vit D  25 hydroxy (rtn osteoporosis monitoring) - CBC with Differential/Platelet  4. Type 2 diabetes mellitus without complication -Continue with Amaryl and aggressive diet habits and  exercise as tolerated - POCT glycosylated hemoglobin (Hb A1C) - POCT UA - Microalbumin - CBC with Differential/Platelet  5. PROSTATE CANCER, HX OF -Continue regular follow-up with urology - CBC with Differential/Platelet  6. Degenerative arthritis of the lumbar and cervical spine -Patient was asked to let me know if this discomfort gets worse and we will send him back to the neurosurgeon for further evaluation and management  Patient Instructions                       Medicare Annual Wellness Visit  [facility] and the medical providers at [facility] strive to bring you the best medical care.  In doing so we not only want to address your current medical conditions and concerns but also to detect new conditions early and prevent illness, disease and health-related problems.    Medicare offers a yearly Wellness Visit which allows our clinical staff to assess your need for preventative services including immunizations, lifestyle education, counseling to decrease risk of preventable diseases and screening for fall risk and other medical concerns.    This visit is provided free of charge (no copay) for all  Medicare recipients. The clinical pharmacists at [facility] have begun to conduct these Wellness Visits which will also include a thorough review of all your medications.    As you primary medical provider recommend that you make an appointment for your Annual Wellness Visit if you have not done so already this year.  You may set up this appointment before you leave today or you may call back [phone]) and schedule an appointment.  Please make sure when you call that you mention that you are scheduling your Annual Wellness Visit with the clinical pharmacist so that the appointment may be made for the proper length of time.     Continue current medications. Continue good therapeutic lifestyle changes which include good diet and exercise. Fall  precautions discussed with patient. If an FOBT was given today- please return it to our front desk. If you are over 65 years old - you may need Prevnar 78 or the adult Pneumonia vaccine.  Flu Shots are still available at our office. If you still haven't had one please call to set up a nurse visit to get one.   After your visit with [place] today you will receive a survey in the mail or online from [employer] regarding your care with [place]. Please take a moment to fill this out. Your feedback is very important to [place] as you can help [place] better understand your patient needs as well as improve your experience and satisfaction. WE CARE ABOUT YOU!!!   The patient should continue to get as much physical activity as his neck and back will allow him to do. He should watch his sodium intake and he should check his blood sugars and blood pressures at home and bring these readings in for review when he comes to the office If the low back pain gets progressively worse, he should get in touch with [place], and we will call and set him up an appointment with the neurosurgeon that he is seen in the past for further follow-up Watch diet as closely as possible and drink more water   Arrie Senate MD

## 2014-10-22 NOTE — Addendum Note (Signed)
Addended by: Selmer Dominion on: 10/22/2014 12:23 PM   Modules accepted: Orders

## 2014-10-23 LAB — HEPATIC FUNCTION PANEL
ALT: 24 IU/L (ref 0–44)
AST: 26 IU/L (ref 0–40)
Albumin: 4.3 g/dL (ref 3.5–4.8)
Alkaline Phosphatase: 79 IU/L (ref 39–117)
Bilirubin Total: 0.9 mg/dL (ref 0.0–1.2)
Bilirubin, Direct: 0.26 mg/dL (ref 0.00–0.40)
Total Protein: 6.3 g/dL (ref 6.0–8.5)

## 2014-10-23 LAB — BMP8+EGFR
BUN / CREAT RATIO: 16 (ref 10–22)
BUN: 14 mg/dL (ref 8–27)
CO2: 28 mmol/L (ref 18–29)
CREATININE: 0.9 mg/dL (ref 0.76–1.27)
Calcium: 9.4 mg/dL (ref 8.6–10.2)
Chloride: 100 mmol/L (ref 97–108)
GFR, EST AFRICAN AMERICAN: 100 mL/min/{1.73_m2} (ref >59)
GFR, EST NON AFRICAN AMERICAN: 86 mL/min/{1.73_m2} (ref >59)
GLUCOSE: 103 mg/dL — AB (ref 65–99)
Potassium: 3.7 mmol/L (ref 3.5–5.2)
Sodium: 143 mmol/L (ref 134–144)

## 2014-10-23 LAB — CBC WITH DIFFERENTIAL/PLATELET
BASOS: 1 %
Basophils Absolute: 0.1 10*3/uL (ref 0.0–0.2)
Eos: 3 %
Eosinophils Absolute: 0.2 10*3/uL (ref 0.0–0.4)
HEMATOCRIT: 38.7 % (ref 37.5–51.0)
HEMOGLOBIN: 13 g/dL (ref 12.6–17.7)
IMMATURE GRANS (ABS): 0 10*3/uL (ref 0.0–0.1)
Immature Granulocytes: 0 %
Lymphocytes Absolute: 1.6 10*3/uL (ref 0.7–3.1)
Lymphs: 24 %
MCH: 33.1 pg — AB (ref 26.6–33.0)
MCHC: 33.6 g/dL (ref 31.5–35.7)
MCV: 99 fL — AB (ref 79–97)
MONOS ABS: 0.6 10*3/uL (ref 0.1–0.9)
Monocytes: 9 %
NEUTROS ABS: 4.3 10*3/uL (ref 1.4–7.0)
Neutrophils Relative %: 63 %
Platelets: 222 10*3/uL (ref 150–379)
RBC: 3.93 x10E6/uL — ABNORMAL LOW (ref 4.14–5.80)
RDW: 13.7 % (ref 12.3–15.4)
WBC: 6.7 10*3/uL (ref 3.4–10.8)

## 2014-10-23 LAB — NMR, LIPOPROFILE
CHOLESTEROL: 121 mg/dL (ref 100–199)
HDL Cholesterol by NMR: 42 mg/dL (ref >39)
HDL PARTICLE NUMBER: 30.6 umol/L (ref >=30.5)
LDL Particle Number: 935 nmol/L (ref <1000)
LDL SIZE: 20.5 nm (ref >20.5)
LDL-C: 65 mg/dL (ref 0–99)
LP-IR SCORE: 53 — AB (ref <=45)
SMALL LDL PARTICLE NUMBER: 412 nmol/L (ref <=527)
TRIGLYCERIDES BY NMR: 70 mg/dL (ref 0–149)

## 2014-10-23 LAB — MICROALBUMIN, URINE: MICROALBUM., U, RANDOM: 5.3 ug/mL (ref 0.0–17.0)

## 2014-10-23 LAB — VITAMIN D 25 HYDROXY (VIT D DEFICIENCY, FRACTURES): Vit D, 25-Hydroxy: 46.9 ng/mL (ref 30.0–100.0)

## 2014-11-05 ENCOUNTER — Other Ambulatory Visit: Payer: Self-pay | Admitting: Family Medicine

## 2014-11-08 ENCOUNTER — Other Ambulatory Visit: Payer: Self-pay | Admitting: Family Medicine

## 2014-11-11 ENCOUNTER — Other Ambulatory Visit: Payer: Self-pay | Admitting: Family Medicine

## 2014-11-15 ENCOUNTER — Other Ambulatory Visit: Payer: Medicare Other

## 2014-11-15 DIAGNOSIS — Z1212 Encounter for screening for malignant neoplasm of rectum: Secondary | ICD-10-CM

## 2014-11-15 NOTE — Progress Notes (Signed)
Lab only 

## 2014-11-17 LAB — FECAL OCCULT BLOOD, IMMUNOCHEMICAL: Fecal Occult Bld: NEGATIVE

## 2014-12-05 ENCOUNTER — Other Ambulatory Visit: Payer: Self-pay | Admitting: Family Medicine

## 2014-12-17 ENCOUNTER — Encounter: Payer: Self-pay | Admitting: Gastroenterology

## 2014-12-25 ENCOUNTER — Other Ambulatory Visit: Payer: Self-pay | Admitting: Family Medicine

## 2014-12-25 NOTE — Telephone Encounter (Signed)
Last seen 10/22/14 [facility]  If approved route to nurse to call into [facility]

## 2014-12-27 ENCOUNTER — Encounter: Payer: Self-pay | Admitting: *Deleted

## 2014-12-27 ENCOUNTER — Other Ambulatory Visit: Payer: Self-pay | Admitting: Family Medicine

## 2015-01-27 ENCOUNTER — Encounter (HOSPITAL_COMMUNITY): Payer: Self-pay | Admitting: Nurse Practitioner

## 2015-01-27 ENCOUNTER — Emergency Department (HOSPITAL_COMMUNITY)
Admission: EM | Admit: 2015-01-27 | Discharge: 2015-01-27 | Disposition: A | Discharge status: Home / Self Care | Payer: Medicare Other | Attending: Emergency Medicine | Admitting: Emergency Medicine

## 2015-01-27 ENCOUNTER — Emergency Department (HOSPITAL_COMMUNITY): Payer: Medicare Other

## 2015-01-27 DIAGNOSIS — J159 Unspecified bacterial pneumonia: Secondary | ICD-10-CM | POA: Insufficient documentation

## 2015-01-27 DIAGNOSIS — Z8719 Personal history of other diseases of the digestive system: Secondary | ICD-10-CM | POA: Diagnosis not present

## 2015-01-27 DIAGNOSIS — I10 Essential (primary) hypertension: Secondary | ICD-10-CM | POA: Diagnosis not present

## 2015-01-27 DIAGNOSIS — E119 Type 2 diabetes mellitus without complications: Secondary | ICD-10-CM | POA: Insufficient documentation

## 2015-01-27 DIAGNOSIS — J189 Pneumonia, unspecified organism: Secondary | ICD-10-CM

## 2015-01-27 DIAGNOSIS — F419 Anxiety disorder, unspecified: Secondary | ICD-10-CM | POA: Diagnosis not present

## 2015-01-27 DIAGNOSIS — Z8546 Personal history of malignant neoplasm of prostate: Secondary | ICD-10-CM | POA: Insufficient documentation

## 2015-01-27 DIAGNOSIS — F329 Major depressive disorder, single episode, unspecified: Secondary | ICD-10-CM | POA: Insufficient documentation

## 2015-01-27 DIAGNOSIS — Z8669 Personal history of other diseases of the nervous system and sense organs: Secondary | ICD-10-CM | POA: Diagnosis not present

## 2015-01-27 DIAGNOSIS — R05 Cough: Secondary | ICD-10-CM | POA: Diagnosis present

## 2015-01-27 DIAGNOSIS — Z79899 Other long term (current) drug therapy: Secondary | ICD-10-CM | POA: Diagnosis not present

## 2015-01-27 DIAGNOSIS — Z87891 Personal history of nicotine dependence: Secondary | ICD-10-CM | POA: Diagnosis not present

## 2015-01-27 DIAGNOSIS — Z87442 Personal history of urinary calculi: Secondary | ICD-10-CM | POA: Diagnosis not present

## 2015-01-27 DIAGNOSIS — R0602 Shortness of breath: Secondary | ICD-10-CM | POA: Diagnosis not present

## 2015-01-27 LAB — BASIC METABOLIC PANEL
Anion gap: 10 (ref 5–15)
BUN: 16 mg/dL (ref 6–20)
CO2: 27 mmol/L (ref 22–32)
Calcium: 8.7 mg/dL — ABNORMAL LOW (ref 8.9–10.3)
Chloride: 102 mmol/L (ref 101–111)
Creatinine, Ser: 1.07 mg/dL (ref 0.61–1.24)
Glucose, Bld: 191 mg/dL — ABNORMAL HIGH (ref 65–99)
Potassium: 3.7 mmol/L (ref 3.5–5.1)
Sodium: 139 mmol/L (ref 135–145)

## 2015-01-27 LAB — CBC
HCT: 33.6 % — ABNORMAL LOW (ref 39.0–52.0)
HEMOGLOBIN: 12 g/dL — AB (ref 13.0–17.0)
MCH: 33.5 pg (ref 26.0–34.0)
MCHC: 35.7 g/dL (ref 30.0–36.0)
MCV: 93.9 fL (ref 78.0–100.0)
PLATELETS: 203 10*3/uL (ref 150–400)
RBC: 3.58 MIL/uL — ABNORMAL LOW (ref 4.22–5.81)
RDW: 12.8 % (ref 11.5–15.5)
WBC: 13.6 10*3/uL — ABNORMAL HIGH (ref 4.0–10.5)

## 2015-01-27 MED ORDER — HYDROCODONE-HOMATROPINE 5-1.5 MG/5ML PO SYRP: 5.0000 mL | ORAL_SOLUTION | Freq: Four times a day (QID) | ORAL | Status: DC | PRN

## 2015-01-27 MED ORDER — DOXYCYCLINE HYCLATE 100 MG PO CAPS
100.0000 mg | ORAL_CAPSULE | Freq: Two times a day (BID) | ORAL | Status: DC
Start: 2015-01-27 — End: 2015-02-26

## 2015-01-27 MED ORDER — ALBUTEROL SULFATE HFA 108 (90 BASE) MCG/ACT IN AERS
2.0000 | INHALATION_SPRAY | Freq: Once | RESPIRATORY_TRACT | Status: AC
  Administered 2015-01-27: 2 via RESPIRATORY_TRACT
  Filled 2015-01-27: qty 6.7

## 2015-01-27 MED ORDER — PREDNISONE 20 MG PO TABS
50.0000 mg | ORAL_TABLET | Freq: Once | ORAL | Status: AC
  Administered 2015-01-27: 50 mg via ORAL
  Filled 2015-01-27: qty 3

## 2015-01-27 MED ORDER — PREDNISONE 50 MG PO TABS: 50.0000 mg | ORAL_TABLET | Freq: Every day | ORAL | Status: DC

## 2015-01-27 NOTE — Discharge Instructions (Signed)

## 2015-01-27 NOTE — ED Notes (Signed)
MD resident at bedside

## 2015-01-27 NOTE — ED Notes (Signed)
Pt reports painful cough with yellow sputum, body aches, chills, fever x 1 week. He tried tylenol with no relief. He is A&Ox4, rsep e/u

## 2015-01-27 NOTE — ED Provider Notes (Signed)
CSN: [id number]     Arrival date & time 01/27/15  1155 History   First MD Initiated Contact with Patient 01/27/15 1501     Chief Complaint  Patient presents with  . Cough    HPI  Patient is a 69 year old male with a history of 20-pack-year smoking presented the day for cough and increasing sputum production over the last 7 days.  Patient denies any frank chest pain but reports shortness of breath during coughing episodes. Increasing abdominal wall tenderness after coughing episodes as well. Denies any frank vomiting or diarrhea. Intermittent fevers and chills with increasing coughing at night. Coughing associated with wheezing.  Aggravated by movement and alleviated by rest.   Past Medical History  Diagnosis Date  . Personal history of malignant neoplasm of prostate   . Gout, unspecified   . Depressive disorder, not elsewhere classified   . Anxiety state, unspecified   . Calculus of kidney   . Type II or unspecified type diabetes mellitus without mention of complication, not stated as uncontrolled   . Irritable bowel syndrome   . Diverticulosis of colon (without mention of hemorrhage)   . Hypertension, essential    Past Surgical History  Procedure Laterality Date  . Hernia repair      left side  . Prostate surgery    . Tonsillectomy     Family History  Problem Relation Age of Onset  . Cancer Maternal Aunt     breast  . Pancreatic cancer Paternal Aunt   . Prostate cancer Paternal Uncle   . Heart disease Maternal Grandmother 69  . Heart disease Maternal Grandfather   . Heart disease Paternal Grandfather 65  . Prostate cancer Father   . Coronary artery disease Father 42    CABG   History  Substance Use Topics  . Smoking status: Former Smoker    Quit date: 10/13/1978  . Smokeless tobacco: Not on file  . Alcohol Use: Yes    Review of Systems  Constitutional: Negative for fever and chills.  HENT: Negative for congestion and sore throat.   Eyes: Negative for pain.   Respiratory: Positive for cough, shortness of breath and wheezing. Negative for chest tightness and stridor.   Cardiovascular: Negative for chest pain and palpitations.  Gastrointestinal: Negative for nausea, vomiting, abdominal pain and diarrhea.  Endocrine: Negative.   Genitourinary: Negative for flank pain.  Musculoskeletal: Negative for back pain and neck pain.  Skin: Negative for rash.  Allergic/Immunologic: Negative.   Neurological: Negative for dizziness, syncope and light-headedness.  Psychiatric/Behavioral: Negative for confusion.   Allergies  Review of patient's allergies indicates no known allergies.  Home Medications   Prior to Admission medications   Medication Sig Start Date End Date Taking? Authorizing Provider  amLODipine (NORVASC) 10 MG tablet TAKE ONE (1) TABLET EACH DAY 11/11/14   Chipper Herb, MD  atorvastatin (LIPITOR) 80 MG tablet TAKE ONE (1) TABLET EACH DAY 05/16/14   Chipper Herb, MD  benazepril (LOTENSIN) 20 MG tablet TAKE ONE (1) TABLET EACH DAY 12/06/14   Chipper Herb, MD  beta carotene 25000 UNIT capsule Take 25,000 Units by mouth daily.    Historical Provider, MD  Cholecalciferol (VITAMIN D) 2000 UNITS CAPS Take 1 capsule by mouth daily.    Historical Provider, MD  clonazePAM (KLONOPIN) 0.5 MG tablet TAKE ONE TABLET TWICE DAILY AS NEEDED 12/26/14   Chipper Herb, MD  FeFum-FePo-FA-B Cmp-C-Zn-Mn-Cu (SE-TAN PLUS) 162-115.2-1 MG CAPS TAKE ONE CAPSULE BY MOUTH TWICE A DAY  11/13/14   Chipper Herb, MD  fish oil-omega-3 fatty acids 1000 MG capsule Take 2 g by mouth daily.    Historical Provider, MD  glimepiride (AMARYL) 4 MG tablet TAKE 1/2 TABLET TWICE DAILY 12/27/14   Chipper Herb, MD  HYDROcodone-homatropine [facility]) 5-1.5 MG/5ML syrup Take 5 mLs by mouth every 8 (eight) hours as needed for cough. 07/31/14   Lysbeth Penner, FNP  losartan-hydrochlorothiazide (HYZAAR) 100-25 MG per tablet TAKE ONE (1) TABLET EACH DAY 10/09/14   Chipper Herb, MD   metFORMIN (GLUCOPHAGE) 500 MG tablet TAKE ONE TABLET TWICE A DAY WITH FOOD 11/06/14   Chipper Herb, MD  Multiple Vitamins-Minerals (CENTRUM SILVER PO) Take by mouth.    Historical Provider, MD  omeprazole (PRILOSEC) 20 MG capsule TAKE ONE (1) CAPSULE EACH DAY 10/01/14   Chipper Herb, MD  PARoxetine (PAXIL) 40 MG tablet TAKE ONE (1) TABLET EACH DAY 11/11/14   Chipper Herb, MD   BP 154/73 mmHg  Pulse 94  Temp(Src) 99.2 F (37.3 C) (Oral)  Resp 20  Ht 5\' 4"  (1.626 m)  Wt 207 lb (93.895 kg)  BMI 35.51 kg/m2  SpO2 99% Physical Exam  Constitutional: He is oriented to person, place, and time. He appears well-developed and well-nourished.  HENT:  Head: Normocephalic and atraumatic.  Eyes: Conjunctivae and EOM are normal. Pupils are equal, round, and reactive to light.  Neck: Normal range of motion. Neck supple.  Cardiovascular: Normal rate, regular rhythm, normal heart sounds and intact distal pulses.   Pulses:      Radial pulses are 2+ on the right side, and 2+ on the left side.  Pulmonary/Chest: Effort normal. No respiratory distress. He has wheezes in the right lower field and the left lower field. He has rales in the left lower field.  Abdominal: Soft. Bowel sounds are normal. There is no tenderness. There is no rebound and no CVA tenderness.  Musculoskeletal: Normal range of motion.  Neurological: He is alert and oriented to person, place, and time. He has normal reflexes. No cranial nerve deficit.  Skin: Skin is warm and dry.    ED Course  Procedures   Labs Review Labs Reviewed  CBC - Abnormal; Notable for the following:    WBC 13.6 (*)    RBC 3.58 (*)    Hemoglobin 12.0 (*)    HCT 33.6 (*)    All other components within normal limits  BASIC METABOLIC PANEL - Abnormal; Notable for the following:    Glucose, Bld 191 (*)    Calcium 8.7 (*)    All other components within normal limits    Imaging Review Dg Chest 2 View  01/27/2015   CLINICAL DATA:  Productive cough for  1 week.  Initial encounter.  EXAM: CHEST  2 VIEW  COMPARISON:  06/12/2013 and 10/24/2003 radiographs.  FINDINGS: Stable chronic right hemidiaphragm elevation and right basilar atelectasis. The left lung is clear. The heart size and mediastinal contours are stable with aortic atherosclerosis. No significant pleural effusion or pneumothorax. The bones appear unchanged.  IMPRESSION: Stable chest with chronic right basilar atelectasis. No acute cardiopulmonary process.   Electronically Signed   By: Richardean Sale M.D.   On: 01/27/2015 13:32     EKG Interpretation None      MDM   Final diagnoses:  None   On evaluation patient hemodynamically stable in no acute distress. He did have elevated white blood cell count with focal right basilar crackles. Cough and  increased sputum production with mild intermittent wheezes throughout as well. Prescribed prednisone albuterol and dicyclomine for likely developing pneumonia.  Advised to f/u with PCP in 3-5 days.   If performed, labs, EKGs, and imaging were reviewed/interpreted by myself and my attending and incorporated into medical decision making.  Discussed pertinent finding with patient or caregiver prior to discharge with no further questions.  Immediate return precautions given and pt or caregiver reports understanding.  Pt care supervised by my attending Dr. Loistine Chance, MD PGY-2  Emergency Medicine     Geronimo Boot, MD 01/28/15 [place], MD 01/29/15 [phone]

## 2015-01-27 NOTE — ED Notes (Signed)
MD at bedside and aware of temperature

## 2015-01-28 ENCOUNTER — Other Ambulatory Visit: Payer: Self-pay | Admitting: Family Medicine

## 2015-02-06 ENCOUNTER — Other Ambulatory Visit: Payer: Self-pay | Admitting: Family Medicine

## 2015-02-07 ENCOUNTER — Other Ambulatory Visit: Payer: Self-pay | Admitting: Family Medicine

## 2015-02-26 ENCOUNTER — Ambulatory Visit (INDEPENDENT_AMBULATORY_CARE_PROVIDER_SITE_OTHER): Payer: Medicare Other | Admitting: Family Medicine

## 2015-02-26 ENCOUNTER — Encounter: Payer: Self-pay | Admitting: Cardiology

## 2015-02-26 ENCOUNTER — Encounter: Payer: Self-pay | Admitting: Family Medicine

## 2015-02-26 ENCOUNTER — Ambulatory Visit (INDEPENDENT_AMBULATORY_CARE_PROVIDER_SITE_OTHER): Payer: Medicare Other

## 2015-02-26 ENCOUNTER — Ambulatory Visit (INDEPENDENT_AMBULATORY_CARE_PROVIDER_SITE_OTHER): Payer: Medicare Other | Admitting: Cardiology

## 2015-02-26 VITALS — BP 148/81 | HR 81 | Ht 64.0 in | Wt 209.0 lb

## 2015-02-26 VITALS — BP 128/70 | HR 91 | Temp 97.6°F | Ht 64.0 in | Wt 207.0 lb

## 2015-02-26 DIAGNOSIS — E119 Type 2 diabetes mellitus without complications: Secondary | ICD-10-CM

## 2015-02-26 DIAGNOSIS — E559 Vitamin D deficiency, unspecified: Secondary | ICD-10-CM | POA: Diagnosis not present

## 2015-02-26 DIAGNOSIS — I10 Essential (primary) hypertension: Secondary | ICD-10-CM | POA: Diagnosis not present

## 2015-02-26 DIAGNOSIS — J189 Pneumonia, unspecified organism: Secondary | ICD-10-CM

## 2015-02-26 DIAGNOSIS — M545 Low back pain: Secondary | ICD-10-CM | POA: Diagnosis not present

## 2015-02-26 DIAGNOSIS — I700 Atherosclerosis of aorta: Secondary | ICD-10-CM

## 2015-02-26 DIAGNOSIS — E291 Testicular hypofunction: Secondary | ICD-10-CM

## 2015-02-26 DIAGNOSIS — M4716 Other spondylosis with myelopathy, lumbar region: Secondary | ICD-10-CM | POA: Diagnosis not present

## 2015-02-26 DIAGNOSIS — I493 Ventricular premature depolarization: Secondary | ICD-10-CM

## 2015-02-26 DIAGNOSIS — E349 Endocrine disorder, unspecified: Secondary | ICD-10-CM

## 2015-02-26 DIAGNOSIS — I499 Cardiac arrhythmia, unspecified: Secondary | ICD-10-CM

## 2015-02-26 DIAGNOSIS — E785 Hyperlipidemia, unspecified: Secondary | ICD-10-CM | POA: Diagnosis not present

## 2015-02-26 LAB — POCT GLYCOSYLATED HEMOGLOBIN (HGB A1C): HEMOGLOBIN A1C: 6.8

## 2015-02-26 NOTE — Patient Instructions (Addendum)
Medicare Annual Wellness Visit  [facility] and the medical providers at [facility] strive to bring you the best medical care.  In doing so we not only want to address your current medical conditions and concerns but also to detect new conditions early and prevent illness, disease and health-related problems.    Medicare offers a yearly Wellness Visit which allows our clinical staff to assess your need for preventative services including immunizations, lifestyle education, counseling to decrease risk of preventable diseases and screening for fall risk and other medical concerns.    This visit is provided free of charge (no copay) for all Medicare recipients. The clinical pharmacists at [facility] have begun to conduct these Wellness Visits which will also include a thorough review of all your medications.    As you primary medical provider recommend that you make an appointment for your Annual Wellness Visit if you have not done so already this year.  You may set up this appointment before you leave today or you may call back ([phone]) and schedule an appointment.  Please make sure when you call that you mention that you are scheduling your Annual Wellness Visit with the clinical pharmacist so that the appointment may be made for the proper length of time.     Continue current medications. Continue good therapeutic lifestyle changes which include good diet and exercise. Fall precautions discussed with patient. If an FOBT was given today- please return it to our front desk. If you are over 22 years old - you may need Prevnar 53 or the adult Pneumonia vaccine.   After your visit with [place] today you will receive a survey in the mail or online from [employer] regarding your care with [place]. Please take a moment to fill this out. Your feedback is very important to [place] as you can help [place] better understand your patient needs as well as  improve your experience and satisfaction. WE CARE ABOUT YOU!!!   Stay is active as possible and exercising regularly. Check blood sugars and blood pressures at home and bring these readings to the next visit Follow-up with urology as planned

## 2015-02-26 NOTE — Patient Instructions (Signed)
Medication Instructions:  Your physician recommends that you continue on your current medications as directed. Please refer to the Current Medication list given to you today.  Testing/Procedures: Your physician has requested that you have an exercise tolerance test. For further information please visit [email or web address]. Please also follow instruction sheet, as given.  Follow-Up: As needed after your treadmill.  Thank you for choosing [facility]!!

## 2015-02-26 NOTE — Progress Notes (Signed)
Subjective:    Patient ID: George Wang, male    DOB: [birth year], 69 y.o.   MRN: [id number]  HPI Pt here for follow up and management of chronic medical problems which includes hypertension, hyperlipidemia, and diabetes. He is taking medications regularly. Patient does complain of some low back pain and left leg pain that is especially worse after arising in the morning or getting up from a sitting position. He describes no injury. He does have a history of degenerative cervical spine disease. About 1 month ago he was in the hospital emergency room because of a community-acquired pneumonia and took a course of doxycycline and prednisone. He doesn't check his blood pressures at home and they generally run in the 120s over the 70s. His fasting blood sugars run anywhere from the upper 80s and low 90s to 120. As far as the community acquired pneumonia he is feeling better. He sees the urologist yearly in December because of his cancer of the prostate history of 11 years ago. Otherwise he denies chest pain or shortness of breath. He has no symptoms with his GI tract. He is voiding without problems and does have some incontinence because of his prostate surgery in the past.      Patient Active Problem List   Diagnosis Date Noted  . Hypertension 02/28/2013  . Hyperlipemia 02/28/2013  . Type 2 diabetes mellitus 02/28/2013  . Vitamin D deficiency 02/28/2013  . Overweight(278.02) 10/13/2011  . PVC (premature ventricular contraction) 10/13/2011  . HEMOCCULT POSITIVE STOOL 08/14/2008  . Iron deficiency anemia 06/27/2008  . GOUT 04/25/2008  . ANXIETY 04/25/2008  . Depression 04/25/2008  . DIVERTICULOSIS, COLON 04/25/2008  . IRRITABLE BOWEL SYNDROME 04/25/2008  . NEPHROLITHIASIS 04/25/2008  . PROSTATE CANCER, HX OF 04/25/2008   Outpatient Encounter Prescriptions as of 02/26/2015  Medication Sig  . acetaminophen (TYLENOL) 325 MG tablet Take 650 mg by mouth every 6 (six) hours as needed for  mild pain.  Marland Kitchen amLODipine (NORVASC) 10 MG tablet TAKE ONE (1) TABLET EACH DAY  . atorvastatin (LIPITOR) 80 MG tablet TAKE ONE (1) TABLET EACH DAY  . benazepril (LOTENSIN) 20 MG tablet TAKE ONE (1) TABLET EACH DAY  . beta carotene 25000 UNIT capsule Take 25,000 Units by mouth daily.  . Cholecalciferol (VITAMIN D) 2000 UNITS CAPS Take 1 capsule by mouth daily.  . clonazePAM (KLONOPIN) 0.5 MG tablet TAKE ONE TABLET TWICE DAILY AS NEEDED  . FeFum-FePo-FA-B Cmp-C-Zn-Mn-Cu (SE-TAN PLUS) 162-115.2-1 MG CAPS TAKE ONE CAPSULE BY MOUTH TWICE A DAY  . fish oil-omega-3 fatty acids 1000 MG capsule Take 2 g by mouth daily.  Marland Kitchen glimepiride (AMARYL) 4 MG tablet TAKE 1/2 TABLET TWICE DAILY  . losartan-hydrochlorothiazide (HYZAAR) 100-25 MG per tablet TAKE ONE (1) TABLET EACH DAY  . metFORMIN (GLUCOPHAGE) 500 MG tablet TAKE ONE TABLET TWICE A DAY WITH FOOD  . Multiple Vitamins-Minerals (CENTRUM SILVER PO) Take 1 tablet by mouth daily.   Marland Kitchen omeprazole (PRILOSEC) 20 MG capsule TAKE ONE (1) CAPSULE EACH DAY  . PARoxetine (PAXIL) 40 MG tablet TAKE ONE (1) TABLET EACH DAY  . [DISCONTINUED] atorvastatin (LIPITOR) 40 MG tablet Take 40 mg by mouth daily.  . [DISCONTINUED] doxycycline (VIBRAMYCIN) 100 MG capsule Take 1 capsule (100 mg total) by mouth 2 (two) times daily.  . [DISCONTINUED] HYDROcodone-homatropine (HYCODAN) 5-1.5 MG/5ML syrup Take 5 mLs by mouth every 6 (six) hours as needed for cough.  . [DISCONTINUED] predniSONE (DELTASONE) 50 MG tablet Take 1 tablet (50 mg total) by mouth daily.  No facility-administered encounter medications on file as of 02/26/2015.      Review of Systems  Constitutional: Negative.   HENT: Negative.   Eyes: Negative.   Respiratory: Negative.   Cardiovascular: Negative.   Gastrointestinal: Negative.   Endocrine: Negative.   Genitourinary: Negative.   Musculoskeletal: Positive for back pain and arthralgias (left leg).  Skin: Negative.   Allergic/Immunologic: Negative.     Neurological: Negative.   Hematological: Negative.   Psychiatric/Behavioral: Negative.        Objective:   Physical Exam  Constitutional: He is oriented to person, place, and time. He appears well-developed and well-nourished. No distress.  HENT:  Head: Normocephalic and atraumatic.  Right Ear: External ear normal.  Left Ear: External ear normal.  Mouth/Throat: Oropharynx is clear and moist. No oropharyngeal exudate.  Minimal nasal congestion bilaterally  Eyes: Conjunctivae and EOM are normal. Pupils are equal, round, and reactive to light. Right eye exhibits no discharge. Left eye exhibits no discharge. No scleral icterus.  Neck: Normal range of motion. Neck supple. No thyromegaly present.  No carotid bruits thyromegaly or anterior cervical adenopathy  Cardiovascular: Normal rate, normal heart sounds and intact distal pulses.   No murmur heard. The heart today was slightly irregular at 72/m  Pulmonary/Chest: Effort normal and breath sounds normal. No respiratory distress. He has no wheezes. He has no rales. He exhibits no tenderness.  Clear anteriorly and posteriorly  Abdominal: Soft. Bowel sounds are normal. He exhibits no mass. There is no tenderness. There is no rebound and no guarding.  The abdomen is obese without masses tenderness or organ enlargement  Genitourinary:  This is followed yearly by Dr. Jeffie Pollock in December  Musculoskeletal: Normal range of motion. He exhibits no edema or tenderness.  Patient had some discomfort with going from a sitting position to a supine position and arising from this position in his low back. There is no  palpable tenderness and no rash.  Lymphadenopathy:    He has no cervical adenopathy.  Neurological: He is alert and oriented to person, place, and time. He has normal reflexes. No cranial nerve deficit.  Skin: Skin is warm and dry. No rash noted. No erythema. No pallor.  Psychiatric: He has a normal mood and affect. His behavior is normal.  Judgment and thought content normal.  Nursing note and vitals reviewed.  BP 128/70 mmHg  Pulse 91  Temp(Src) 97.6 F (36.4 C) (Oral)  Ht _0  (1.626 m)  Wt 207 lb (93.895 kg)  BMI 35.51 kg/m2  EKG: Frequent PVCs and trigeminy  [facility] reading (PRIMARY) by  Dr. Brunilda Payor x-ray and LS spine films  -- chest x-ray no active disease except there is thoracic aortic atherosclerosis. The LS-spine films revealed some compressed vertebrae and severe degenerative changes in the lower lumbar spine                                     Assessment & Plan:  1. Essential hypertension -Low pressure is good today he should continue with current treatment. Home blood pressure readings are also good. - BMP8+EGFR - Hepatic function panel - DG Chest 2 View; Future - CBC with Differential/Platelet - EKG 12-Lead  2. Hyperlipemia -He should continue with his statin drug and with as aggressive therapeutic lifestyle changes as possible pending results of lab work - NMR, lipoprofile - CBC with Differential/Platelet - EKG 12-Lead  3. Vitamin D deficiency -Continue  with vitamin D replacement pending results of lab work - Vit D  25 hydroxy (rtn osteoporosis monitoring) - CBC with Differential/Platelet  4. Type 2 diabetes mellitus without complication -Continue with diabetes treatment and exercise and weight loss - POCT glycosylated hemoglobin (Hb A1C) - BMP8+EGFR - CBC with Differential/Platelet - EKG 12-Lead  5. Testosterone deficiency -The patient has had prostate cancer and has refused any treatment at this point in time. - CBC with Differential/Platelet  6. CAP (community acquired pneumonia) -He is improved with the community acquired pneumonia and he took his antibiotics as directed and his lungs are clear today. - DG Chest 2 View; Future - CBC with Differential/Platelet  7. Low back pain, unspecified back pain laterality, with sciatica presence unspecified -There is no injury associated  with this and his symptoms appear to be compatible with wear and tear arthritis. - DG Lumbar Spine 2-3 Views; Future  8. Irregular heart beat -This is a relatively new finding for this patient. He has no symptoms other than he is aware that the heartbeat is irregular. - EKG 12-Lead  9. Thoracic aortic atherosclerosis -Continue with cholesterol medication  10. Degenerative arthritis lumbar spine -Take Tylenol for pain   Patient Instructions                       Medicare Annual Wellness Visit  [facility] and the medical providers at [facility] strive to bring you the best medical care.  In doing so we not only want to address your current medical conditions and concerns but also to detect new conditions early and prevent illness, disease and health-related problems.    Medicare offers a yearly Wellness Visit which allows our clinical staff to assess your need for preventative services including immunizations, lifestyle education, counseling to decrease risk of preventable diseases and screening for fall risk and other medical concerns.    This visit is provided free of charge (no copay) for all Medicare recipients. The clinical pharmacists at [facility] have begun to conduct these Wellness Visits which will also include a thorough review of all your medications.    As you primary medical provider recommend that you make an appointment for your Annual Wellness Visit if you have not done so already this year.  You may set up this appointment before you leave today or you may call back ([phone]) and schedule an appointment.  Please make sure when you call that you mention that you are scheduling your Annual Wellness Visit with the clinical pharmacist so that the appointment may be made for the proper length of time.     Continue current medications. Continue good therapeutic lifestyle changes which include good diet and exercise. Fall  precautions discussed with patient. If an FOBT was given today- please return it to our front desk. If you are over 71 years old - you may need Prevnar 34 or the adult Pneumonia vaccine.   After your visit with [place] today you will receive a survey in the mail or online from [employer] regarding your care with [place]. Please take a moment to fill this out. Your feedback is very important to [place] as you can help [place] better understand your patient needs as well as improve your experience and satisfaction. WE CARE ABOUT YOU!!!   Stay is active as possible and exercising regularly. Check blood sugars and blood pressures at home and bring these readings to the next visit Follow-up with urology as  planned   Arrie Senate MD

## 2015-02-26 NOTE — Progress Notes (Signed)
Cardiology Office Note   Date:  02/26/2015   ID:  [name], Meter 10-30-1945, MRN [id number]  PCP:  Redge Gainer, MD  Cardiologist:   Minus Breeding, MD   No chief complaint on file.     History of Present Illness: George Wang is a 69 y.o. male who presents for evaluation of PVCs.  I saw him greater than 3 years ago for evaluation of palpitations. He's had a normal echocardiogram in the past.  He did not require further workup. He's had no other significant cardiac history. He was getting his routine evaluation today and was noted to have frequent PVCs. He was added to my schedule. He said he doesn't really feel palpitations. He does not have any chest pressure, neck or arm discomfort. He has any presyncope or syncope. He only notices it if he reaches up and takes his pulse.  He is not particularly active. With his low level of activity he denies any cardiovascular symptoms. He doesn't describe any significant shortness of breath, PND or orthopnea.  Past Medical History  Diagnosis Date  . Personal history of malignant neoplasm of prostate   . Gout, unspecified   . Depressive disorder, not elsewhere classified   . Anxiety state, unspecified   . Calculus of kidney   . Type II or unspecified type diabetes mellitus without mention of complication, not stated as uncontrolled   . Irritable bowel syndrome   . Diverticulosis of colon (without mention of hemorrhage)   . Hypertension, essential   . Cancer     Prostate  . Anemia     Iron deficiency  . Hyperlipidemia     Past Surgical History  Procedure Laterality Date  . Hernia repair      left side  . Prostate surgery    . Tonsillectomy       Current Outpatient Prescriptions  Medication Sig Dispense Refill  . acetaminophen (TYLENOL) 325 MG tablet Take 650 mg by mouth every 6 (six) hours as needed for mild pain.    Marland Kitchen amLODipine (NORVASC) 10 MG tablet Take 10 mg by mouth daily.    Marland Kitchen atorvastatin (LIPITOR) 80  MG tablet Take 80 mg by mouth daily.    . benazepril (LOTENSIN) 40 MG tablet Take 40 mg by mouth daily.    . beta carotene 25000 UNIT capsule Take 25,000 Units by mouth daily.    . Cholecalciferol (VITAMIN D) 2000 UNITS CAPS Take 1 capsule by mouth daily.    . clonazePAM (KLONOPIN) 0.5 MG tablet Take 0.5 mg by mouth 2 (two) times daily as needed for anxiety.    . FeFum-FePo-FA-B Cmp-C-Zn-Mn-Cu (SE-TAN PLUS) 162-115.2-1 MG CAPS TAKE ONE CAPSULE BY MOUTH TWICE A DAY 60 capsule 5  . fish oil-omega-3 fatty acids 1000 MG capsule Take 2 g by mouth daily.    Marland Kitchen glimepiride (AMARYL) 4 MG tablet Take 4 mg by mouth. Take 1/2 tablet by mouth twice daily    . losartan-hydrochlorothiazide (HYZAAR) 100-25 MG per tablet Take 1 tablet by mouth daily.    . metFORMIN (GLUCOPHAGE) 500 MG tablet Take 500 mg by mouth 2 (two) times daily with a meal.    . Multiple Vitamins-Minerals (CENTRUM SILVER PO) Take 1 tablet by mouth daily.     Marland Kitchen omeprazole (PRILOSEC) 20 MG capsule Take 20 mg by mouth daily.    Marland Kitchen PARoxetine (PAXIL) 40 MG tablet Take 40 mg by mouth at bedtime.    . [DISCONTINUED] febuxostat (ULORIC) 40 MG tablet  Take 80 mg by mouth daily.    . [DISCONTINUED] simvastatin (ZOCOR) 80 MG tablet Take 80 mg by mouth at bedtime.     No current facility-administered medications for this visit.    Allergies:   Review of patient's allergies indicates no known allergies.    Social History:  The patient  reports that he quit smoking about 36 years ago. He does not have any smokeless tobacco history on file. He reports that he drinks alcohol. He reports that he does not use illicit drugs.   Family History:  The patient's family history includes Cancer in his maternal aunt; Coronary artery disease (age of onset: 37) in his father; Heart disease in his maternal grandfather; Heart disease (age of onset: 81) in his maternal grandmother; Heart disease (age of onset: 61) in his paternal grandfather; Pancreatic cancer in his  paternal aunt; Prostate cancer in his father and paternal uncle.    ROS:  Please see the history of present illness.   Otherwise, review of systems are positive for none.   All other systems are reviewed and negative.    PHYSICAL EXAM: VS:  BP 148/81 mmHg  Pulse 81  Ht 5\' 4"  (1.626 m)  Wt 209 lb (94.802 kg)  BMI 35.86 kg/m2 , BMI Body mass index is 35.86 kg/(m^2). GENERAL:  Well appearing HEENT:  Pupils equal round and reactive, fundi not visualized, oral mucosa unremarkable NECK:  No jugular venous distention, waveform within normal limits, carotid upstroke brisk and symmetric, no bruits, no thyromegaly LYMPHATICS:  No cervical, inguinal adenopathy LUNGS:  Clear to auscultation bilaterally BACK:  No CVA tenderness CHEST:  Unremarkable HEART:  PMI not displaced or sustained,S1 and S within normal limits, no S3, no S4, no clicks, no rubs, no murmurs ABD:  Flat, positive bowel sounds normal in frequency in pitch, no bruits, no rebound, no guarding, no midline pulsatile mass, no hepatomegaly, no splenomegaly EXT:  2 plus pulses throughout, no edema, no cyanosis no clubbing SKIN:  No rashes no nodules NEURO:  Cranial nerves II through XII grossly intact, motor grossly intact throughout PSYCH:  Cognitively intact, oriented to person place and time    EKG:  EKG is ordered today. The ekg ordered today demonstrates sinus rhythm, rate 82, axis leftward, possible inferior infarct old, poor anterior R wave progression, premature ectopic complexes in a trigeminal pattern.   Recent Labs: 05/30/2014: TSH 0.948 10/22/2014: ALT 24 01/27/2015: BUN 16; Creatinine, Ser 1.07; Hemoglobin 12.0*; Platelets 203; Potassium 3.7; Sodium 139    Lipid Panel    Component Value Date/Time   CHOL 121 10/22/2014 1136   CHOL 103 11/15/2012 1108   TRIG 70 10/22/2014 1136   TRIG 50 11/15/2012 1108   HDL 42 10/22/2014 1136   HDL 37* 11/15/2012 1108   LDLCALC 61 01/29/2014 1135   LDLCALC 56 11/15/2012 1108       Wt Readings from Last 3 Encounters:  02/26/15 209 lb (94.802 kg)  02/26/15 207 lb (93.895 kg)  01/27/15 207 lb (93.895 kg)      Other studies Reviewed: Additional studies/ records that were reviewed today include: Echo 2013 and previous consult note. Review of the above records demonstrates:  Please see elsewhere in the note.     ASSESSMENT AND PLAN:  PVCs: He's had a structurally normal heart on echo in the past. Because he does have some cardiovascular risk factors I like to make sure he doesn't have any obstructive coronary disease. I think the pretest probability of this  is low. He may have trouble walking on a treadmill but I'm going to try to order a POET (Plain Old Exercise Treadmill).  If there is no obvious ischemia or inducible arrhythmia, since he is asymptomatic, no change in therapy would be planned.  OBESITY:  The patient understands the need to lose weight with diet and exercise. We have discussed specific strategies for this.   Current medicines are reviewed at length with the patient today.  The patient does not have concerns regarding medicines.  The following changes have been made:  no change  Labs/ tests ordered today include:   Orders Placed This Encounter  Procedures  . Exercise Tolerance Test     Disposition:   FU with me as needed.     Signed, Minus Breeding, MD  02/26/2015 12:50 PM    [facility]

## 2015-02-27 ENCOUNTER — Ambulatory Visit: Payer: Medicare Other | Admitting: Family Medicine

## 2015-02-27 LAB — HEPATIC FUNCTION PANEL
ALT: 22 IU/L (ref 0–44)
AST: 27 IU/L (ref 0–40)
Albumin: 4.3 g/dL (ref 3.5–4.8)
Alkaline Phosphatase: 70 IU/L (ref 39–117)
BILIRUBIN, DIRECT: 0.23 mg/dL (ref 0.00–0.40)
Bilirubin Total: 0.8 mg/dL (ref 0.0–1.2)
Total Protein: 6.1 g/dL (ref 6.0–8.5)

## 2015-02-27 LAB — CBC WITH DIFFERENTIAL/PLATELET
BASOS ABS: 0.1 10*3/uL (ref 0.0–0.2)
Basos: 1 %
EOS (ABSOLUTE): 0.2 10*3/uL (ref 0.0–0.4)
Eos: 3 %
Hematocrit: 36 % — ABNORMAL LOW (ref 37.5–51.0)
Hemoglobin: 12.5 g/dL — ABNORMAL LOW (ref 12.6–17.7)
IMMATURE GRANULOCYTES: 0 %
Immature Grans (Abs): 0 10*3/uL (ref 0.0–0.1)
Lymphocytes Absolute: 1.5 10*3/uL (ref 0.7–3.1)
Lymphs: 25 %
MCH: 33.7 pg — AB (ref 26.6–33.0)
MCHC: 34.7 g/dL (ref 31.5–35.7)
MCV: 97 fL (ref 79–97)
MONOS ABS: 0.6 10*3/uL (ref 0.1–0.9)
Monocytes: 10 %
Neutrophils Absolute: 3.6 10*3/uL (ref 1.4–7.0)
Neutrophils: 61 %
PLATELETS: 221 10*3/uL (ref 150–379)
RBC: 3.71 x10E6/uL — ABNORMAL LOW (ref 4.14–5.80)
RDW: 14 % (ref 12.3–15.4)
WBC: 5.8 10*3/uL (ref 3.4–10.8)

## 2015-02-27 LAB — BMP8+EGFR
BUN/Creatinine Ratio: 13 (ref 10–22)
BUN: 13 mg/dL (ref 8–27)
CO2: 28 mmol/L (ref 18–29)
Calcium: 9.2 mg/dL (ref 8.6–10.2)
Chloride: 100 mmol/L (ref 97–108)
Creatinine, Ser: 0.97 mg/dL (ref 0.76–1.27)
GFR calc Af Amer: 90 mL/min/{1.73_m2}
GFR calc non Af Amer: 78 mL/min/{1.73_m2}
Glucose: 155 mg/dL — ABNORMAL HIGH (ref 65–99)
Potassium: 4.2 mmol/L (ref 3.5–5.2)
Sodium: 142 mmol/L (ref 134–144)

## 2015-02-27 LAB — NMR, LIPOPROFILE
Cholesterol: 119 mg/dL (ref 100–199)
HDL Cholesterol by NMR: 45 mg/dL
HDL Particle Number: 30 umol/L — ABNORMAL LOW
LDL Particle Number: 959 nmol/L
LDL Size: 20.2 nm
LDL-C: 59 mg/dL (ref 0–99)
LP-IR Score: 61 — ABNORMAL HIGH
Small LDL Particle Number: 575 nmol/L — ABNORMAL HIGH
Triglycerides by NMR: 77 mg/dL (ref 0–149)

## 2015-02-27 LAB — VITAMIN D 25 HYDROXY (VIT D DEFICIENCY, FRACTURES): Vit D, 25-Hydroxy: 49.1 ng/mL (ref 30.0–100.0)

## 2015-03-28 ENCOUNTER — Ambulatory Visit (INDEPENDENT_AMBULATORY_CARE_PROVIDER_SITE_OTHER): Payer: Medicare Other

## 2015-03-28 ENCOUNTER — Encounter: Payer: Medicare Other | Admitting: Physician Assistant

## 2015-03-28 DIAGNOSIS — I493 Ventricular premature depolarization: Secondary | ICD-10-CM

## 2015-03-28 LAB — EXERCISE TOLERANCE TEST
CHL RATE OF PERCEIVED EXERTION: 13
CSEPEW: 4.6 METS
Exercise duration (min): 3 min
MPHR: 149 {beats}/min
Peak HR: 133 {beats}/min
Percent HR: 89 %
Rest HR: 85 {beats}/min

## 2015-04-01 ENCOUNTER — Other Ambulatory Visit: Payer: Self-pay | Admitting: Family Medicine

## 2015-04-18 ENCOUNTER — Ambulatory Visit (INDEPENDENT_AMBULATORY_CARE_PROVIDER_SITE_OTHER): Payer: Medicare Other

## 2015-04-18 DIAGNOSIS — Z23 Encounter for immunization: Secondary | ICD-10-CM

## 2015-04-22 ENCOUNTER — Other Ambulatory Visit: Payer: Self-pay | Admitting: Family Medicine

## 2015-04-22 NOTE — Telephone Encounter (Signed)
Last filled 02/20/15, last seen 02/26/15. [facility] into [facility]

## 2015-05-02 ENCOUNTER — Other Ambulatory Visit: Payer: Self-pay | Admitting: Family Medicine

## 2015-05-06 ENCOUNTER — Other Ambulatory Visit: Payer: Self-pay | Admitting: Family Medicine

## 2015-05-06 DIAGNOSIS — H2513 Age-related nuclear cataract, bilateral: Secondary | ICD-10-CM | POA: Diagnosis not present

## 2015-05-06 DIAGNOSIS — H52223 Regular astigmatism, bilateral: Secondary | ICD-10-CM | POA: Diagnosis not present

## 2015-05-06 DIAGNOSIS — E119 Type 2 diabetes mellitus without complications: Secondary | ICD-10-CM | POA: Diagnosis not present

## 2015-05-06 DIAGNOSIS — H5203 Hypermetropia, bilateral: Secondary | ICD-10-CM | POA: Diagnosis not present

## 2015-05-06 LAB — HM DIABETES EYE EXAM

## 2015-05-12 ENCOUNTER — Other Ambulatory Visit: Payer: Self-pay | Admitting: Family Medicine

## 2015-05-19 ENCOUNTER — Encounter: Payer: Self-pay | Admitting: Family Medicine

## 2015-06-02 ENCOUNTER — Other Ambulatory Visit: Payer: Self-pay | Admitting: *Deleted

## 2015-06-02 DIAGNOSIS — I493 Ventricular premature depolarization: Secondary | ICD-10-CM

## 2015-06-02 MED ORDER — METFORMIN HCL 500 MG PO TABS: 500.0000 mg | ORAL_TABLET | Freq: Two times a day (BID) | ORAL | Status: DC

## 2015-06-06 ENCOUNTER — Other Ambulatory Visit: Payer: Self-pay | Admitting: Family Medicine

## 2015-06-24 ENCOUNTER — Other Ambulatory Visit: Payer: Self-pay | Admitting: Family Medicine

## 2015-07-02 ENCOUNTER — Other Ambulatory Visit: Payer: Medicare Other

## 2015-07-02 DIAGNOSIS — Z8546 Personal history of malignant neoplasm of prostate: Secondary | ICD-10-CM | POA: Diagnosis not present

## 2015-07-02 NOTE — Progress Notes (Signed)
Lab only 

## 2015-07-03 LAB — PSA, TOTAL AND FREE: PSA, Free: <0.01 ng/mL

## 2015-07-08 ENCOUNTER — Telehealth: Payer: Self-pay | Admitting: Family Medicine

## 2015-07-09 DIAGNOSIS — N393 Stress incontinence (female) (male): Secondary | ICD-10-CM | POA: Diagnosis not present

## 2015-07-09 DIAGNOSIS — E291 Testicular hypofunction: Secondary | ICD-10-CM | POA: Diagnosis not present

## 2015-07-09 DIAGNOSIS — N200 Calculus of kidney: Secondary | ICD-10-CM | POA: Diagnosis not present

## 2015-07-09 DIAGNOSIS — Z8546 Personal history of malignant neoplasm of prostate: Secondary | ICD-10-CM | POA: Diagnosis not present

## 2015-07-09 DIAGNOSIS — N5201 Erectile dysfunction due to arterial insufficiency: Secondary | ICD-10-CM | POA: Diagnosis not present

## 2015-07-10 NOTE — Telephone Encounter (Signed)
Pt has had eye exam

## 2015-07-15 ENCOUNTER — Ambulatory Visit (INDEPENDENT_AMBULATORY_CARE_PROVIDER_SITE_OTHER): Payer: Medicare Other | Admitting: Family Medicine

## 2015-07-15 ENCOUNTER — Encounter: Payer: Self-pay | Admitting: Family Medicine

## 2015-07-15 VITALS — BP 123/69 | HR 84 | Temp 98.1°F | Ht 64.0 in | Wt 210.0 lb

## 2015-07-15 DIAGNOSIS — Z8546 Personal history of malignant neoplasm of prostate: Secondary | ICD-10-CM

## 2015-07-15 DIAGNOSIS — E291 Testicular hypofunction: Secondary | ICD-10-CM | POA: Diagnosis not present

## 2015-07-15 DIAGNOSIS — I10 Essential (primary) hypertension: Secondary | ICD-10-CM | POA: Diagnosis not present

## 2015-07-15 DIAGNOSIS — E785 Hyperlipidemia, unspecified: Secondary | ICD-10-CM | POA: Diagnosis not present

## 2015-07-15 DIAGNOSIS — E349 Endocrine disorder, unspecified: Secondary | ICD-10-CM

## 2015-07-15 DIAGNOSIS — E559 Vitamin D deficiency, unspecified: Secondary | ICD-10-CM

## 2015-07-15 DIAGNOSIS — E119 Type 2 diabetes mellitus without complications: Secondary | ICD-10-CM

## 2015-07-15 LAB — POCT GLYCOSYLATED HEMOGLOBIN (HGB A1C): Hemoglobin A1C: 6.8

## 2015-07-15 NOTE — Progress Notes (Signed)
Subjective:    Patient ID: George Wang, male    DOB: [birth year], 69 y.o.   MRN: [id number]  HPI Pt here for follow up and management of chronic medical problems which includes diabetes and hypertension. Pt is taking medications regularly. This patient is followed regularly by the urologist because of his history of prostate cancer. He is due to get lab work today. He is been a diabetic and has had hypertension for many years. He also has testosterone deficiency. The patient does describe some shortness of breath when climbing but this is no worse than usual. He has had a stress test by the cardiologist. He denies chest pain trouble swallowing heartburn indigestion nausea vomiting diarrhea or blood in the stool. He does have dark stools but not black stools. He has a history of an anemia. He has had prostate cancer surgery and does have problems with leakage when he coughs or sneezes. He wears a pad for this. He just saw the urologist this month.    Patient Active Problem List   Diagnosis Date Noted  . Hypertension 02/28/2013  . Hyperlipemia 02/28/2013  . Type 2 diabetes mellitus ([place]) 02/28/2013  . Vitamin D deficiency 02/28/2013  . Overweight(278.02) 10/13/2011  . PVC (premature ventricular contraction) 10/13/2011  . HEMOCCULT POSITIVE STOOL 08/14/2008  . Iron deficiency anemia 06/27/2008  . GOUT 04/25/2008  . ANXIETY 04/25/2008  . Depression 04/25/2008  . DIVERTICULOSIS, COLON 04/25/2008  . IRRITABLE BOWEL SYNDROME 04/25/2008  . NEPHROLITHIASIS 04/25/2008  . PROSTATE CANCER, HX OF 04/25/2008   Outpatient Encounter Prescriptions as of 07/15/2015  Medication Sig  . acetaminophen (TYLENOL) 325 MG tablet Take 650 mg by mouth every 6 (six) hours as needed for mild pain.  Marland Kitchen amLODipine (NORVASC) 10 MG tablet TAKE ONE (1) TABLET EACH DAY  . atorvastatin (LIPITOR) 80 MG tablet TAKE ONE (1) TABLET EACH DAY  . benazepril (LOTENSIN) 20 MG tablet TAKE ONE (1) TABLET EACH DAY  . beta  carotene 25000 UNIT capsule Take 25,000 Units by mouth daily.  . Cholecalciferol (VITAMIN D) 2000 UNITS CAPS Take 1 capsule by mouth daily.  . clonazePAM (KLONOPIN) 0.5 MG tablet TAKE ONE TABLET TWICE DAILY AS NEEDED  . FeFum-FePo-FA-B Cmp-C-Zn-Mn-Cu (SE-TAN PLUS) 162-115.2-1 MG CAPS TAKE ONE CAPSULE BY MOUTH TWICE A DAY  . fish oil-omega-3 fatty acids 1000 MG capsule Take 2 g by mouth daily.  Marland Kitchen glimepiride (AMARYL) 4 MG tablet TAKE 1/2 TABLET TWICE DAILY  . losartan-hydrochlorothiazide (HYZAAR) 100-25 MG tablet TAKE ONE (1) TABLET EACH DAY  . metFORMIN (GLUCOPHAGE) 500 MG tablet Take 1 tablet (500 mg total) by mouth 2 (two) times daily with a meal.  . Multiple Vitamins-Minerals (CENTRUM SILVER PO) Take 1 tablet by mouth daily.   Marland Kitchen omeprazole (PRILOSEC) 20 MG capsule Take 20 mg by mouth daily.  Marland Kitchen PARoxetine (PAXIL) 40 MG tablet TAKE ONE (1) TABLET EACH DAY  . [DISCONTINUED] amLODipine (NORVASC) 10 MG tablet Take 10 mg by mouth daily.  . [DISCONTINUED] atorvastatin (LIPITOR) 80 MG tablet Take 80 mg by mouth daily.  . [DISCONTINUED] benazepril (LOTENSIN) 40 MG tablet Take 40 mg by mouth daily.  . [DISCONTINUED] losartan-hydrochlorothiazide (HYZAAR) 100-25 MG per tablet Take 1 tablet by mouth daily.  . [DISCONTINUED] metFORMIN (GLUCOPHAGE) 500 MG tablet TAKE ONE TABLET TWICE A DAY WITH FOOD  . [DISCONTINUED] PARoxetine (PAXIL) 40 MG tablet Take 40 mg by mouth at bedtime.  . [DISCONTINUED] PARoxetine (PAXIL) 40 MG tablet TAKE ONE (1) TABLET EACH DAY  No facility-administered encounter medications on file as of 07/15/2015.        Review of Systems  Constitutional: Negative.   HENT: Negative.   Eyes: Negative.   Respiratory: Negative.   Cardiovascular: Negative.   Gastrointestinal: Negative.   Endocrine: Negative.   Genitourinary: Negative.   Musculoskeletal: Negative.   Skin: Negative.   Allergic/Immunologic: Negative.   Neurological: Negative.   Hematological: Negative.     Psychiatric/Behavioral: Negative.        Objective:   Physical Exam  Constitutional: He is oriented to person, place, and time. He appears well-developed and well-nourished. No distress.  HENT:  Head: Normocephalic and atraumatic.  Right Ear: External ear normal.  Left Ear: External ear normal.  Nose: Nose normal.  Mouth/Throat: Oropharynx is clear and moist. No oropharyngeal exudate.  Eyes: Conjunctivae and EOM are normal. Pupils are equal, round, and reactive to light. Right eye exhibits no discharge. Left eye exhibits no discharge. No scleral icterus.  Neck: Normal range of motion. Neck supple. No thyromegaly present.  Without bruits or thyromegaly  Cardiovascular: Normal rate, regular rhythm, normal heart sounds and intact distal pulses.   No murmur heard. The rhythm was regular except for one PVC that was heard while listening  Pulmonary/Chest: Effort normal and breath sounds normal. No respiratory distress. He has no wheezes. He has no rales. He exhibits no tenderness.  Abdominal: Soft. Bowel sounds are normal. He exhibits no mass. There is no tenderness. There is no rebound and no guarding.  The abdomen is obese without masses or tenderness  Genitourinary:  This was recently done by the urologist.  Musculoskeletal: Normal range of motion. He exhibits no edema or tenderness.  Lymphadenopathy:    He has no cervical adenopathy.  Neurological: He is alert and oriented to person, place, and time. He has normal reflexes. No cranial nerve deficit.  Skin: Skin is warm and dry. No rash noted.  Psychiatric: He has a normal mood and affect. His behavior is normal. Judgment and thought content normal.  Nursing note and vitals reviewed.  BP 123/69 mmHg  Pulse 84  Temp(Src) 98.1 F (36.7 C) (Oral)  Ht _0  (1.626 m)  Wt 210 lb (95.255 kg)  BMI 36.03 kg/m2        Assessment & Plan:  1. Hyperlipemia -Continue current treatment pending results of lab work - CBC with  Differential/Platelet - Hepatic function panel - NMR, lipoprofile  2. Essential hypertension -The blood pressure is good today. He will continue with current treatment - BMP8+EGFR - CBC with Differential/Platelet - Hepatic function panel  3. Vitamin D deficiency -Continue current treatment pending results of lab work - CBC with Differential/Platelet - VITAMIN D 25 Hydroxy (Vit-D Deficiency, Fractures)  4. Type 2 diabetes mellitus without complication, without long-term current use of insulin ([place]) -Continue current treatment pending results of lab work - BMP8+EGFR - CBC with Differential/Platelet - POCT glycosylated hemoglobin (Hb A1C)  5. Testosterone deficiency -The patient is aware of this but has refused to take any treatment for this problem. - CBC with Differential/Platelet  6. PROSTATE CANCER, HX OF -Continue follow-up with urology - CBC with Differential/Platelet  Patient Instructions                       Medicare Annual Wellness Visit  [facility] and the medical providers at [facility] strive to bring you the best medical care.  In doing so we not only want to address your  current medical conditions and concerns but also to detect new conditions early and prevent illness, disease and health-related problems.    Medicare offers a yearly Wellness Visit which allows our clinical staff to assess your need for preventative services including immunizations, lifestyle education, counseling to decrease risk of preventable diseases and screening for fall risk and other medical concerns.    This visit is provided free of charge (no copay) for all Medicare recipients. The clinical pharmacists at [facility] have begun to conduct these Wellness Visits which will also include a thorough review of all your medications.    As you primary medical provider recommend that you make an appointment for your Annual Wellness Visit if you have  not done so already this year.  You may set up this appointment before you leave today or you may call back [phone]) and schedule an appointment.  Please make sure when you call that you mention that you are scheduling your Annual Wellness Visit with the clinical pharmacist so that the appointment may be made for the proper length of time.     Continue current medications. Continue good therapeutic lifestyle changes which include good diet and exercise. Fall precautions discussed with patient. If an FOBT was given today- please return it to our front desk. If you are over 67 years old - you may need Prevnar 44 or the adult Pneumonia vaccine.  **Flu shots are available--- please call and schedule a FLU-CLINIC appointment**  After your visit with [place] today you will receive a survey in the mail or online from [employer] regarding your care with [place]. Please take a moment to fill this out. Your feedback is very important to [place] as you can help [place] better understand your patient needs as well as improve your experience and satisfaction. WE CARE ABOUT YOU!!!   Please make an appointment with the dermatologist to follow-up the lesion on the lip Continue to walk and exercise as much as possible Check blood sugars regularly Follow-up with urology as planned According to our records your next colonoscopy will be due in October 2019 Return the fecal occult blood test cards at your next visit We will call you with your lab work results as soon as those results become available   Arrie Senate MD

## 2015-07-15 NOTE — Patient Instructions (Addendum)
Medicare Annual Wellness Visit  [facility] and the medical providers at [facility] strive to bring you the best medical care.  In doing so we not only want to address your current medical conditions and concerns but also to detect new conditions early and prevent illness, disease and health-related problems.    Medicare offers a yearly Wellness Visit which allows our clinical staff to assess your need for preventative services including immunizations, lifestyle education, counseling to decrease risk of preventable diseases and screening for fall risk and other medical concerns.    This visit is provided free of charge (no copay) for all Medicare recipients. The clinical pharmacists at [facility] have begun to conduct these Wellness Visits which will also include a thorough review of all your medications.    As you primary medical provider recommend that you make an appointment for your Annual Wellness Visit if you have not done so already this year.  You may set up this appointment before you leave today or you may call back [phone] and schedule an appointment.  Please make sure when you call that you mention that you are scheduling your Annual Wellness Visit with the clinical pharmacist so that the appointment may be made for the proper length of time.     Continue current medications. Continue good therapeutic lifestyle changes which include good diet and exercise. Fall precautions discussed with patient. If an FOBT was given today- please return it to our front desk. If you are over 73 years old - you may need Prevnar 59 or the adult Pneumonia vaccine.  **Flu shots are available--- please call and schedule a FLU-CLINIC appointment**  After your visit with [place] today you will receive a survey in the mail or online from [employer] regarding your care with [place]. Please take a moment to fill this out. Your feedback is very  important to [place] as you can help [place] better understand your patient needs as well as improve your experience and satisfaction. WE CARE ABOUT YOU!!!   Please make an appointment with the dermatologist to follow-up the lesion on the lip Continue to walk and exercise as much as possible Check blood sugars regularly Follow-up with urology as planned According to our records your next colonoscopy will be due in October 2019 Return the fecal occult blood test cards at your next visit We will call you with your lab work results as soon as those results become available

## 2015-07-16 LAB — CBC WITH DIFFERENTIAL/PLATELET
BASOS: 0 %
Basophils Absolute: 0 10*3/uL (ref 0.0–0.2)
EOS (ABSOLUTE): 0.2 10*3/uL (ref 0.0–0.4)
EOS: 2 %
HEMATOCRIT: 35.8 % — AB (ref 37.5–51.0)
Hemoglobin: 12.2 g/dL — ABNORMAL LOW (ref 12.6–17.7)
IMMATURE GRANS (ABS): 0 10*3/uL (ref 0.0–0.1)
IMMATURE GRANULOCYTES: 0 %
LYMPHS: 16 %
Lymphocytes Absolute: 1.5 10*3/uL (ref 0.7–3.1)
MCH: 33.2 pg — ABNORMAL HIGH (ref 26.6–33.0)
MCHC: 34.1 g/dL (ref 31.5–35.7)
MCV: 98 fL — AB (ref 79–97)
Monocytes Absolute: 0.9 10*3/uL (ref 0.1–0.9)
Monocytes: 9 %
NEUTROS PCT: 73 %
Neutrophils Absolute: 7.2 10*3/uL — ABNORMAL HIGH (ref 1.4–7.0)
PLATELETS: 250 10*3/uL (ref 150–379)
RBC: 3.67 x10E6/uL — ABNORMAL LOW (ref 4.14–5.80)
RDW: 13.2 % (ref 12.3–15.4)
WBC: 9.8 10*3/uL (ref 3.4–10.8)

## 2015-07-16 LAB — NMR, LIPOPROFILE
Cholesterol: 120 mg/dL (ref 100–199)
HDL Cholesterol by NMR: 41 mg/dL (ref >39)
HDL Particle Number: 30.1 umol/L — ABNORMAL LOW (ref >=30.5)
LDL PARTICLE NUMBER: 934 nmol/L (ref <1000)
LDL SIZE: 20.6 nm (ref >20.5)
LDL-C: 64 mg/dL (ref 0–99)
LP-IR Score: 60 — ABNORMAL HIGH (ref <=45)
Small LDL Particle Number: 498 nmol/L (ref <=527)
TRIGLYCERIDES BY NMR: 76 mg/dL (ref 0–149)

## 2015-07-16 LAB — BMP8+EGFR
BUN/Creatinine Ratio: 14 (ref 10–22)
BUN: 14 mg/dL (ref 8–27)
CALCIUM: 9.1 mg/dL (ref 8.6–10.2)
CO2: 27 mmol/L (ref 18–29)
CREATININE: 0.97 mg/dL (ref 0.76–1.27)
Chloride: 99 mmol/L (ref 96–106)
GFR calc Af Amer: 90 mL/min/{1.73_m2} (ref >59)
GFR, EST NON AFRICAN AMERICAN: 78 mL/min/{1.73_m2} (ref >59)
Glucose: 148 mg/dL — ABNORMAL HIGH (ref 65–99)
Potassium: 4.6 mmol/L (ref 3.5–5.2)
Sodium: 144 mmol/L (ref 134–144)

## 2015-07-16 LAB — HEPATIC FUNCTION PANEL
ALT: 29 IU/L (ref 0–44)
AST: 29 IU/L (ref 0–40)
Albumin: 4.2 g/dL (ref 3.5–4.8)
Alkaline Phosphatase: 75 IU/L (ref 39–117)
BILIRUBIN, DIRECT: 0.21 mg/dL (ref 0.00–0.40)
Bilirubin Total: 0.7 mg/dL (ref 0.0–1.2)
Total Protein: 6.4 g/dL (ref 6.0–8.5)

## 2015-07-16 LAB — VITAMIN D 25 HYDROXY (VIT D DEFICIENCY, FRACTURES): VIT D 25 HYDROXY: 46.1 ng/mL (ref 30.0–100.0)

## 2015-07-18 ENCOUNTER — Encounter: Payer: Self-pay | Admitting: *Deleted

## 2015-07-24 ENCOUNTER — Other Ambulatory Visit: Payer: Self-pay | Admitting: Family Medicine

## 2015-07-31 ENCOUNTER — Other Ambulatory Visit: Payer: Self-pay | Admitting: Family Medicine

## 2015-08-04 ENCOUNTER — Other Ambulatory Visit: Payer: Self-pay | Admitting: Family Medicine

## 2015-08-30 ENCOUNTER — Other Ambulatory Visit: Payer: Self-pay | Admitting: Family Medicine

## 2015-09-03 ENCOUNTER — Other Ambulatory Visit: Payer: Self-pay | Admitting: Family Medicine

## 2015-09-10 ENCOUNTER — Other Ambulatory Visit: Payer: Self-pay | Admitting: Family Medicine

## 2015-10-14 DIAGNOSIS — L570 Actinic keratosis: Secondary | ICD-10-CM | POA: Diagnosis not present

## 2015-10-14 DIAGNOSIS — L819 Disorder of pigmentation, unspecified: Secondary | ICD-10-CM | POA: Diagnosis not present

## 2015-10-14 DIAGNOSIS — Z85828 Personal history of other malignant neoplasm of skin: Secondary | ICD-10-CM | POA: Diagnosis not present

## 2015-10-14 DIAGNOSIS — D485 Neoplasm of uncertain behavior of skin: Secondary | ICD-10-CM | POA: Diagnosis not present

## 2015-10-14 DIAGNOSIS — C439 Malignant melanoma of skin, unspecified: Secondary | ICD-10-CM | POA: Diagnosis not present

## 2015-10-23 ENCOUNTER — Other Ambulatory Visit: Payer: Self-pay | Admitting: Family Medicine

## 2015-10-23 NOTE — Telephone Encounter (Signed)
Last filled 08/23/15, last seen 07/15/15. Call in at [facility]

## 2015-11-01 ENCOUNTER — Other Ambulatory Visit: Payer: Self-pay | Admitting: Family Medicine

## 2015-11-21 ENCOUNTER — Ambulatory Visit (INDEPENDENT_AMBULATORY_CARE_PROVIDER_SITE_OTHER): Payer: Medicare Other | Admitting: Family Medicine

## 2015-11-21 ENCOUNTER — Other Ambulatory Visit: Payer: Self-pay | Admitting: Family Medicine

## 2015-11-21 ENCOUNTER — Encounter: Payer: Self-pay | Admitting: Family Medicine

## 2015-11-21 VITALS — BP 122/67 | HR 91 | Temp 97.4°F | Ht 64.0 in | Wt 209.0 lb

## 2015-11-21 DIAGNOSIS — Z8546 Personal history of malignant neoplasm of prostate: Secondary | ICD-10-CM

## 2015-11-21 DIAGNOSIS — E349 Endocrine disorder, unspecified: Secondary | ICD-10-CM

## 2015-11-21 DIAGNOSIS — D509 Iron deficiency anemia, unspecified: Secondary | ICD-10-CM

## 2015-11-21 DIAGNOSIS — E785 Hyperlipidemia, unspecified: Secondary | ICD-10-CM

## 2015-11-21 DIAGNOSIS — E559 Vitamin D deficiency, unspecified: Secondary | ICD-10-CM

## 2015-11-21 DIAGNOSIS — E119 Type 2 diabetes mellitus without complications: Secondary | ICD-10-CM

## 2015-11-21 DIAGNOSIS — F329 Major depressive disorder, single episode, unspecified: Secondary | ICD-10-CM | POA: Diagnosis not present

## 2015-11-21 DIAGNOSIS — E291 Testicular hypofunction: Secondary | ICD-10-CM

## 2015-11-21 DIAGNOSIS — I10 Essential (primary) hypertension: Secondary | ICD-10-CM | POA: Diagnosis not present

## 2015-11-21 DIAGNOSIS — F32A Depression, unspecified: Secondary | ICD-10-CM

## 2015-11-21 DIAGNOSIS — Z1211 Encounter for screening for malignant neoplasm of colon: Secondary | ICD-10-CM

## 2015-11-21 LAB — BAYER DCA HB A1C WAIVED: HB A1C (BAYER DCA - WAIVED): 6.9 % (ref <7.0)

## 2015-11-21 NOTE — Progress Notes (Signed)
Subjective:    Patient ID: George Wang, male    DOB: 1945/08/28, 70 y.o.   MRN: [id number]  HPI Pt here for follow up and management of chronic medical problems which includes diabetes, hyperlipidemia, and hypertension. He is taking medications regularly.The patient is doing well today with no particular complaints. He brought in his fecal occult blood test card to be checked today he will get lab work today. His vital signs are stable and his weight is down 1 pound since the last visit. His body mass index is 36.1 and he has 2 or more oh morbid diagnoses which would put him and the morbid obese category. The patient does not check his blood sugars regularly. He is a good A1c's over the past couple years. He denies any chest pain or palpitations or chest tightness. He denies shortness of breath other than climbing heels which she would expect to have. He denies any trouble with nausea vomiting diarrhea blood in the stool or black tarry bowel movements. He continues to take his iron for his iron deficiency anemia. He is also up on Prilosec and has questions about proton pump inhibitors and we had a discussion regarding H2 blockers like Zantac and he may try this. He denies any trouble passing his water. He is having no major issues with his neck or back. He is very stiff in the morning and gets better when he gets up and moves around. He says if he climbs certain heels are walks and irregular terrain his low back and hips were heard him more and he is aware of this but it is no worse than usual. He is not noticing any problems with atrial fib recently.     Patient Active Problem List   Diagnosis Date Noted  . Hypertension 02/28/2013  . Hyperlipemia 02/28/2013  . Type 2 diabetes mellitus ([place]) 02/28/2013  . Vitamin D deficiency 02/28/2013  . Overweight(278.02) 10/13/2011  . PVC (premature ventricular contraction) 10/13/2011  . HEMOCCULT POSITIVE STOOL 08/14/2008  . Iron deficiency anemia  06/27/2008  . GOUT 04/25/2008  . ANXIETY 04/25/2008  . Depression 04/25/2008  . DIVERTICULOSIS, COLON 04/25/2008  . IRRITABLE BOWEL SYNDROME 04/25/2008  . NEPHROLITHIASIS 04/25/2008  . PROSTATE CANCER, HX OF 04/25/2008   Outpatient Encounter Prescriptions as of 11/21/2015  Medication Sig  . acetaminophen (TYLENOL) 325 MG tablet Take 650 mg by mouth every 6 (six) hours as needed for mild pain.  Marland Kitchen amLODipine (NORVASC) 10 MG tablet TAKE ONE (1) TABLET EACH DAY  . atorvastatin (LIPITOR) 80 MG tablet TAKE ONE (1) TABLET EACH DAY  . benazepril (LOTENSIN) 20 MG tablet TAKE ONE (1) TABLET EACH DAY  . beta carotene 25000 UNIT capsule Take 25,000 Units by mouth daily.  . Cholecalciferol (VITAMIN D) 2000 UNITS CAPS Take 1 capsule by mouth daily.  . clonazePAM (KLONOPIN) 0.5 MG tablet TAKE ONE TABLET BY MOUTH TWICE DAILY  . FeFum-FePo-FA-B Cmp-C-Zn-Mn-Cu (SE-TAN PLUS) 162-115.2-1 MG CAPS TAKE ONE CAPSULE BY MOUTH TWICE A DAY  . Fiber POWD Take by mouth.  . fish oil-omega-3 fatty acids 1000 MG capsule Take 2 g by mouth daily.  Marland Kitchen glimepiride (AMARYL) 4 MG tablet TAKE 1/2 TABLET TWICE DAILY  . losartan-hydrochlorothiazide (HYZAAR) 100-25 MG tablet TAKE ONE (1) TABLET EACH DAY  . metFORMIN (GLUCOPHAGE) 500 MG tablet TAKE ONE TABLET TWICE A DAY WITH FOOD  . Multiple Vitamins-Minerals (CENTRUM SILVER PO) Take 1 tablet by mouth daily.   Marland Kitchen omeprazole (PRILOSEC) 20 MG capsule TAKE ONE (  1) CAPSULE EACH DAY  . PARoxetine (PAXIL) 40 MG tablet TAKE ONE (1) TABLET EACH DAY   No facility-administered encounter medications on file as of 11/21/2015.      Review of Systems  Constitutional: Negative.   HENT: Negative.   Eyes: Negative.   Respiratory: Negative.   Cardiovascular: Negative.   Gastrointestinal: Negative.   Endocrine: Negative.   Genitourinary: Negative.   Musculoskeletal: Negative.   Skin: Negative.   Allergic/Immunologic: Negative.   Neurological: Negative.   Hematological: Negative.     Psychiatric/Behavioral: Negative.        Objective:   Physical Exam  Constitutional: He is oriented to person, place, and time. He appears well-developed and well-nourished. No distress.  HENT:  Head: Normocephalic and atraumatic.  Right Ear: External ear normal.  Left Ear: External ear normal.  Nose: Nose normal.  Mouth/Throat: Oropharynx is clear and moist. No oropharyngeal exudate.  Eyes: Conjunctivae and EOM are normal. Pupils are equal, round, and reactive to light. Right eye exhibits no discharge. Left eye exhibits no discharge. No scleral icterus.  Neck: Normal range of motion. Neck supple. No thyromegaly present.  Good range of motion without pain no thyromegaly and no bruits noted.  Cardiovascular: Normal rate, regular rhythm, normal heart sounds and intact distal pulses.   No murmur heard. The heart was regular today at 72/m  Pulmonary/Chest: Effort normal and breath sounds normal. No respiratory distress. He has no wheezes. He has no rales. He exhibits no tenderness.  Clear anteriorly and posteriorly and no axillary adenopathy  Abdominal: Soft. Bowel sounds are normal. He exhibits no mass. There is no tenderness. There is no rebound and no guarding.  No abdominal tenderness masses or organ enlargement and no inguinal adenopathy  Musculoskeletal: Normal range of motion. He exhibits no edema or tenderness.  Lymphadenopathy:    He has no cervical adenopathy.  Neurological: He is alert and oriented to person, place, and time. He has normal reflexes. No cranial nerve deficit.  Skin: Skin is warm and dry. No rash noted. No erythema.  Psychiatric: He has a normal mood and affect. His behavior is normal. Judgment and thought content normal.  His mood was upbeat and positive as he has a history of depression in the past  Nursing note and vitals reviewed.  BP 122/67 mmHg  Pulse 91  Temp(Src) 97.4 F (36.3 C) (Oral)  Ht _0  (1.626 m)  Wt 209 lb (94.802 kg)  BMI 35.86  kg/m2        Assessment & Plan:  1. Essential hypertension -The blood pressure is good and he will continue with current treatment - BMP8+EGFR - CBC with Differential/Platelet - Hepatic function panel  2. Hyperlipemia -Continue with aggressive therapeutic lifestyle changes and current treatment pending results of lab work - CBC with Differential/Platelet - NMR, lipoprofile  3. Type 2 diabetes mellitus without complication, without long-term current use of insulin (HCC) -Continue with weight loss and aggressive therapeutic lifestyle changes pending results of hemoglobin A1c - CBC with Differential/Platelet - Bayer DCA Hb A1c Waived  4. Testosterone deficiency -The patient has a history of low testosterone but has chosen not to take testosterone replacement because of prostate cancer. - CBC with Differential/Platelet  5. PROSTATE CANCER, HX OF - CBC with Differential/Platelet  6. Special screening for malignant neoplasms, colon - Fecal occult blood, imunochemical - CBC with Differential/Platelet  7. Vitamin D deficiency -Continue current treatment pending results of lab work - VITAMIN D 25 Hydroxy (Vit-D Deficiency, Fractures)  8. Depression -Continue Paxil and stay active physically  9. Iron deficiency anemia -Get lab work and continue current treatment pending results of lab work  10. Morbid obesity due to excess calories ([place]) -Work more aggressively on weight loss with diet and exercise  Patient Instructions                       Medicare Annual Wellness Visit  [facility] and the medical providers at [facility] strive to bring you the best medical care.  In doing so we not only want to address your current medical conditions and concerns but also to detect new conditions early and prevent illness, disease and health-related problems.    Medicare offers a yearly Wellness Visit which allows our clinical staff to assess your need for  preventative services including immunizations, lifestyle education, counseling to decrease risk of preventable diseases and screening for fall risk and other medical concerns.    This visit is provided free of charge (no copay) for all Medicare recipients. The clinical pharmacists at [facility] have begun to conduct these Wellness Visits which will also include a thorough review of all your medications.    As you primary medical provider recommend that you make an appointment for your Annual Wellness Visit if you have not done so already this year.  You may set up this appointment before you leave today or you may call back [phone]) and schedule an appointment.  Please make sure when you call that you mention that you are scheduling your Annual Wellness Visit with the clinical pharmacist so that the appointment may be made for the proper length of time.     Continue current medications. Continue good therapeutic lifestyle changes which include good diet and exercise. Fall precautions discussed with patient. If an FOBT was given today- please return it to our front desk. If you are over 37 years old - you may need Prevnar 6 or the adult Pneumonia vaccine.  **Flu shots are available--- please call and schedule a FLU-CLINIC appointment**  After your visit with [place] today you will receive a survey in the mail or online from [employer] regarding your care with [place]. Please take a moment to fill this out. Your feedback is very important to [place] as you can help [place] better understand your patient needs as well as improve your experience and satisfaction. WE CARE ABOUT YOU!!!   The patient should continue to work aggressively on his weight because he is in the morbid obese category. More exercise and better diet habits If he decides to go off the Prilosec over-the-counter, he can try ranitidine, the equate brand 150 mg twice daily before breakfast and supper He should avoid diet  drinks He can consider trying le croix as this is naturally flavored water without artificial sweetener He should continue to be careful and did not put himself at risk for falling because of the arthritis in his neck and back   Arrie Senate MD

## 2015-11-21 NOTE — Patient Instructions (Addendum)
Medicare Annual Wellness Visit  [facility] and the medical providers at [facility] strive to bring you the best medical care.  In doing so we not only want to address your current medical conditions and concerns but also to detect new conditions early and prevent illness, disease and health-related problems.    Medicare offers a yearly Wellness Visit which allows our clinical staff to assess your need for preventative services including immunizations, lifestyle education, counseling to decrease risk of preventable diseases and screening for fall risk and other medical concerns.    This visit is provided free of charge (no copay) for all Medicare recipients. The clinical pharmacists at [facility] have begun to conduct these Wellness Visits which will also include a thorough review of all your medications.    As you primary medical provider recommend that you make an appointment for your Annual Wellness Visit if you have not done so already this year.  You may set up this appointment before you leave today or you may call back [phone] and schedule an appointment.  Please make sure when you call that you mention that you are scheduling your Annual Wellness Visit with the clinical pharmacist so that the appointment may be made for the proper length of time.     Continue current medications. Continue good therapeutic lifestyle changes which include good diet and exercise. Fall precautions discussed with patient. If an FOBT was given today- please return it to our front desk. If you are over 36 years old - you may need Prevnar 79 or the adult Pneumonia vaccine.  **Flu shots are available--- please call and schedule a FLU-CLINIC appointment**  After your visit with [place] today you will receive a survey in the mail or online from [employer] regarding your care with [place]. Please take a moment to fill this out. Your feedback is very  important to [place] as you can help [place] better understand your patient needs as well as improve your experience and satisfaction. WE CARE ABOUT YOU!!!   The patient should continue to work aggressively on his weight because he is in the morbid obese category. More exercise and better diet habits If he decides to go off the Prilosec over-the-counter, he can try ranitidine, the equate brand 150 mg twice daily before breakfast and supper He should avoid diet drinks He can consider trying le croix as this is naturally flavored water without artificial sweetener He should continue to be careful and did not put himself at risk for falling because of the arthritis in his neck and back

## 2015-11-22 LAB — NO SPECIMEN RECEIVED

## 2015-11-23 LAB — FECAL OCCULT BLOOD, IMMUNOCHEMICAL: FECAL OCCULT BLD: NEGATIVE

## 2015-11-24 LAB — BMP8+EGFR

## 2015-11-24 LAB — HEPATIC FUNCTION PANEL

## 2015-11-24 LAB — CBC WITH DIFFERENTIAL/PLATELET

## 2015-11-25 LAB — CBC WITH DIFFERENTIAL/PLATELET
BASOS ABS: 0.1 10*3/uL (ref 0.0–0.2)
BASOS: 1 %
EOS (ABSOLUTE): 0.2 10*3/uL (ref 0.0–0.4)
EOS: 2 %
HEMOGLOBIN: 12.3 g/dL — AB (ref 12.6–17.7)
Hematocrit: 36 % — ABNORMAL LOW (ref 37.5–51.0)
IMMATURE GRANS (ABS): 0 10*3/uL (ref 0.0–0.1)
Immature Granulocytes: 0 %
LYMPHS: 24 %
Lymphocytes Absolute: 1.7 10*3/uL (ref 0.7–3.1)
MCH: 32.8 pg (ref 26.6–33.0)
MCHC: 34.2 g/dL (ref 31.5–35.7)
MCV: 96 fL (ref 79–97)
MONOCYTES: 8 %
Monocytes Absolute: 0.6 10*3/uL (ref 0.1–0.9)
NEUTROS ABS: 4.6 10*3/uL (ref 1.4–7.0)
Neutrophils: 65 %
Platelets: 243 10*3/uL (ref 150–379)
RBC: 3.75 x10E6/uL — ABNORMAL LOW (ref 4.14–5.80)
RDW: 13.3 % (ref 12.3–15.4)
WBC: 7.1 10*3/uL (ref 3.4–10.8)

## 2015-11-25 LAB — BMP8+EGFR
BUN/Creatinine Ratio: 16 (ref 10–24)
BUN: 16 mg/dL (ref 8–27)
CALCIUM: 9.6 mg/dL (ref 8.6–10.2)
CHLORIDE: 96 mmol/L (ref 96–106)
CO2: 28 mmol/L (ref 18–29)
CREATININE: 1.02 mg/dL (ref 0.76–1.27)
GFR, EST AFRICAN AMERICAN: 85 mL/min/{1.73_m2} (ref >59)
GFR, EST NON AFRICAN AMERICAN: 74 mL/min/{1.73_m2} (ref >59)
Glucose: 129 mg/dL — ABNORMAL HIGH (ref 65–99)
Potassium: 3.6 mmol/L (ref 3.5–5.2)
Sodium: 141 mmol/L (ref 134–144)

## 2015-11-25 LAB — HEPATIC FUNCTION PANEL
ALBUMIN: 4.4 g/dL (ref 3.5–4.8)
ALK PHOS: 68 IU/L (ref 39–117)
ALT: 27 IU/L (ref 0–44)
AST: 33 IU/L (ref 0–40)
BILIRUBIN TOTAL: 0.9 mg/dL (ref 0.0–1.2)
BILIRUBIN, DIRECT: 0.24 mg/dL (ref 0.00–0.40)
TOTAL PROTEIN: 6.5 g/dL (ref 6.0–8.5)

## 2015-11-25 LAB — VITAMIN D 25 HYDROXY (VIT D DEFICIENCY, FRACTURES): Vit D, 25-Hydroxy: 47.6 ng/mL (ref 30.0–100.0)

## 2015-11-25 LAB — NMR, LIPOPROFILE

## 2015-11-26 LAB — NMR, LIPOPROFILE
Cholesterol: 124 mg/dL (ref 100–199)
HDL Cholesterol by NMR: 35 mg/dL — ABNORMAL LOW (ref >39)
HDL Particle Number: 27.1 umol/L — ABNORMAL LOW (ref >=30.5)
LDL PARTICLE NUMBER: 1201 nmol/L — AB (ref <1000)
LDL SIZE: 20.3 nm (ref >20.5)
LDL-C: 71 mg/dL (ref 0–99)
LP-IR SCORE: 54 — AB (ref <=45)
Small LDL Particle Number: 738 nmol/L — ABNORMAL HIGH (ref <=527)
Triglycerides by NMR: 91 mg/dL (ref 0–149)

## 2015-11-26 LAB — SPECIMEN STATUS REPORT

## 2015-12-02 ENCOUNTER — Other Ambulatory Visit: Payer: Self-pay | Admitting: Family Medicine

## 2015-12-19 ENCOUNTER — Other Ambulatory Visit: Payer: Self-pay | Admitting: Family Medicine

## 2015-12-22 NOTE — Telephone Encounter (Signed)
Last seen 11/21/15, last filled 10/23/15. [facility]

## 2016-01-03 ENCOUNTER — Other Ambulatory Visit: Payer: Self-pay | Admitting: Family Medicine

## 2016-01-14 DIAGNOSIS — L905 Scar conditions and fibrosis of skin: Secondary | ICD-10-CM | POA: Diagnosis not present

## 2016-01-21 ENCOUNTER — Other Ambulatory Visit: Payer: Self-pay | Admitting: Family Medicine

## 2016-01-28 ENCOUNTER — Other Ambulatory Visit: Payer: Self-pay | Admitting: Family Medicine

## 2016-01-30 ENCOUNTER — Other Ambulatory Visit: Payer: Self-pay | Admitting: Family Medicine

## 2016-02-27 ENCOUNTER — Other Ambulatory Visit: Payer: Self-pay | Admitting: Family Medicine

## 2016-03-01 ENCOUNTER — Other Ambulatory Visit: Payer: Self-pay | Admitting: Family Medicine

## 2016-03-29 ENCOUNTER — Other Ambulatory Visit: Payer: Self-pay | Admitting: Family Medicine

## 2016-04-14 ENCOUNTER — Encounter: Payer: Self-pay | Admitting: Family Medicine

## 2016-04-14 ENCOUNTER — Ambulatory Visit (INDEPENDENT_AMBULATORY_CARE_PROVIDER_SITE_OTHER): Payer: Medicare Other | Admitting: Family Medicine

## 2016-04-14 VITALS — BP 125/64 | HR 80 | Temp 96.9°F | Ht 64.0 in | Wt 205.0 lb

## 2016-04-14 DIAGNOSIS — E291 Testicular hypofunction: Secondary | ICD-10-CM | POA: Diagnosis not present

## 2016-04-14 DIAGNOSIS — I10 Essential (primary) hypertension: Secondary | ICD-10-CM | POA: Diagnosis not present

## 2016-04-14 DIAGNOSIS — Z23 Encounter for immunization: Secondary | ICD-10-CM | POA: Diagnosis not present

## 2016-04-14 DIAGNOSIS — M4726 Other spondylosis with radiculopathy, lumbar region: Secondary | ICD-10-CM

## 2016-04-14 DIAGNOSIS — C61 Malignant neoplasm of prostate: Secondary | ICD-10-CM

## 2016-04-14 DIAGNOSIS — Z8546 Personal history of malignant neoplasm of prostate: Secondary | ICD-10-CM

## 2016-04-14 DIAGNOSIS — E785 Hyperlipidemia, unspecified: Secondary | ICD-10-CM

## 2016-04-14 DIAGNOSIS — E119 Type 2 diabetes mellitus without complications: Secondary | ICD-10-CM | POA: Diagnosis not present

## 2016-04-14 DIAGNOSIS — D509 Iron deficiency anemia, unspecified: Secondary | ICD-10-CM

## 2016-04-14 DIAGNOSIS — E559 Vitamin D deficiency, unspecified: Secondary | ICD-10-CM | POA: Diagnosis not present

## 2016-04-14 DIAGNOSIS — E349 Endocrine disorder, unspecified: Secondary | ICD-10-CM

## 2016-04-14 LAB — BAYER DCA HB A1C WAIVED: HB A1C (BAYER DCA - WAIVED): 7 % — ABNORMAL HIGH (ref <7.0)

## 2016-04-14 NOTE — Progress Notes (Signed)
Subjective:    Patient ID: George Wang, male    DOB: 07-15-1946, 70 y.o.   MRN: [id number]  HPI Pt here for follow up and management of chronic medical problems which includes diabetes, hyperlipidemia and hypertension.The patient is doing well today overall. He does complain of back pain and has a history of arthritis. Due to get his flu shot. He'll get his lab work done today. He is followed by Dr. Jeffie Pollock because of the prostate cancer. His vital signs are stable and his weight is down 4 pounds. The patient denies any chest pain or pressure. He says he is out of shape and if he walks a long distance he will get some shortness of breath and this is not anything new. He has occasional swelling in his feet but is always gone in the morning. He denies any PND. He denies any trouble with his stomach including heartburn indigestion nausea vomiting diarrhea or blood in the stool. He takes higher because of his chronic iron deficiency anemia and occasionally his stools will be dark. He is not due a colonoscopy until about 2 years from now. He says he's passing his water well. He cannot get an appointment for follow-up with his urologist and would like for [place] to try to get this in January.    Patient Active Problem List   Diagnosis Date Noted  . Morbid obesity ([place]) 11/21/2015  . Hypertension 02/28/2013  . Hyperlipemia 02/28/2013  . Type 2 diabetes mellitus ([place]) 02/28/2013  . Vitamin D deficiency 02/28/2013  . Overweight(278.02) 10/13/2011  . PVC (premature ventricular contraction) 10/13/2011  . HEMOCCULT POSITIVE STOOL 08/14/2008  . Iron deficiency anemia 06/27/2008  . GOUT 04/25/2008  . ANXIETY 04/25/2008  . Depression 04/25/2008  . DIVERTICULOSIS, COLON 04/25/2008  . IRRITABLE BOWEL SYNDROME 04/25/2008  . NEPHROLITHIASIS 04/25/2008  . PROSTATE CANCER, HX OF 04/25/2008   Outpatient Encounter Prescriptions as of 04/14/2016  Medication Sig  . acetaminophen (TYLENOL) 325 MG tablet Take  650 mg by mouth every 6 (six) hours as needed for mild pain.  Marland Kitchen amLODipine (NORVASC) 10 MG tablet TAKE ONE (1) TABLET EACH DAY  . atorvastatin (LIPITOR) 80 MG tablet TAKE ONE (1) TABLET EACH DAY  . benazepril (LOTENSIN) 20 MG tablet TAKE ONE (1) TABLET EACH DAY  . beta carotene 25000 UNIT capsule Take 25,000 Units by mouth daily.  . Cholecalciferol (VITAMIN D) 2000 UNITS CAPS Take 1 capsule by mouth daily.  . clonazePAM (KLONOPIN) 0.5 MG tablet TAKE ONE TABLET BY MOUTH TWICE DAILY  . FeFum-FePo-FA-B Cmp-C-Zn-Mn-Cu (SE-TAN PLUS) 162-115.2-1 MG CAPS TAKE ONE CAPSULE BY MOUTH TWICE A DAY  . Fiber POWD Take by mouth.  . fish oil-omega-3 fatty acids 1000 MG capsule Take 2 g by mouth daily.  Marland Kitchen glimepiride (AMARYL) 4 MG tablet TAKE 1/2 TABLET TWICE DAILY  . losartan-hydrochlorothiazide (HYZAAR) 100-25 MG tablet TAKE ONE (1) TABLET EACH DAY  . metFORMIN (GLUCOPHAGE) 500 MG tablet TAKE ONE TABLET BY MOUTH TWICE DAILY  . Multiple Vitamins-Minerals (CENTRUM SILVER PO) Take 1 tablet by mouth daily.   Marland Kitchen omeprazole (PRILOSEC) 20 MG capsule TAKE ONE (1) CAPSULE EACH DAY  . PARoxetine (PAXIL) 40 MG tablet TAKE ONE (1) TABLET EACH DAY   No facility-administered encounter medications on file as of 04/14/2016.      Review of Systems  Constitutional: Negative.   HENT: Negative.   Eyes: Negative.   Respiratory: Negative.   Cardiovascular: Negative.   Gastrointestinal: Negative.   Endocrine: Negative.  Genitourinary: Negative.   Musculoskeletal: Positive for arthralgias (pains worse in the morning) and back pain.  Skin: Negative.   Allergic/Immunologic: Negative.   Neurological: Negative.   Hematological: Negative.   Psychiatric/Behavioral: Negative.        Objective:   Physical Exam  Constitutional: He is oriented to person, place, and time. He appears well-developed and well-nourished. No distress.  HENT:  Head: Normocephalic and atraumatic.  Right Ear: External ear normal.  Nose: Nose  normal.  Mouth/Throat: Oropharynx is clear and moist. No oropharyngeal exudate.  Earwax left ear canal  Eyes: Conjunctivae and EOM are normal. Pupils are equal, round, and reactive to light. Right eye exhibits no discharge. Left eye exhibits no discharge. No scleral icterus.  Neck: Normal range of motion. Neck supple. No thyromegaly present.  No bruits thyromegaly or anterior cervical adenopathy  Cardiovascular: Normal rate, normal heart sounds and intact distal pulses.   No murmur heard. The heart is slightly irregular at 72/m. The patient had a stress test earlier in the year by the cardiologist and was told that everything was good.  Pulmonary/Chest: Effort normal and breath sounds normal. No respiratory distress. He has no wheezes. He has no rales. He exhibits no tenderness.  Clear anteriorly and posteriorly and no axillary adenopathy  Abdominal: Soft. Bowel sounds are normal. He exhibits no mass. There is no tenderness. There is no rebound and no guarding.  The abdomen remains obese without masses tenderness or organ enlargement  Genitourinary:  Genitourinary Comments: The patient sees the urologist on a yearly basis and that's coming up toward the end of this year.  Musculoskeletal: Normal range of motion. He exhibits no edema.  The patient has stiffness and limited motion with his neck and back.  Lymphadenopathy:    He has no cervical adenopathy.  Neurological: He is alert and oriented to person, place, and time. He has normal reflexes. No cranial nerve deficit.  Skin: Skin is warm and dry. No rash noted.  Psychiatric: He has a normal mood and affect. His behavior is normal. Judgment and thought content normal.  Nursing note and vitals reviewed.  BP 125/64 (BP Location: Left Arm)   Pulse 80   Temp (!) 96.9 F (36.1 C) (Oral)   Ht _0  (1.626 m)   Wt 205 lb (93 kg)   BMI 35.19 kg/m         Assessment & Plan:  1. Essential hypertension -The blood pressure is good today  and he will continue with current treatment - BMP8+EGFR - CBC with Differential/Platelet - Hepatic function panel  2. Hyperlipemia -Continue with current treatment pending results of lab work - CBC with Differential/Platelet - NMR, lipoprofile  3. Type 2 diabetes mellitus without complication, without long-term current use of insulin (HCC) -His blood sugar at home this morning was 110. He does not check his blood sugars that often. He will continue with current treatment pending results of lab work - Bayer DCA Hb A1c Waived - CBC with Differential/Platelet - Microalbumin / creatinine urine ratio  4. Testosterone deficiency -This is an ongoing problem and he does not choose to do any testosterone replacement at this time. - CBC with Differential/Platelet  5. PROSTATE CANCER, HX OF -Continue to follow-up with urology - CBC with Differential/Platelet  6. Vitamin D deficiency -Continue current treatment pending results of lab work - CBC with Differential/Platelet - VITAMIN D 25 Hydroxy (Vit-D Deficiency, Fractures)  7. Morbid obesity due to excess calories [facility]) -He is considered morbidly obese  because of 2 or more risk factors and a BMI greater than 35. He is encouraged to join a fitness program and work with him individually to burn more calories and lose weight.  8. Iron deficiency anemia -Continue with iron replacement, next colonoscopy is due in about 2 years.  9. Osteoarthritis of spine with radiculopathy, lumbar region -Tylenol for aches and pains  10. Prostate cancer [facility]) -Follow-up with urology as planned  11. Encounter for immunization - Flu vaccine HIGH DOSE PF  Patient Instructions                       Medicare Annual Wellness Visit  [facility] and the medical providers at [facility] strive to bring you the best medical care.  In doing so we not only want to address your current medical conditions and concerns but also to detect new  conditions early and prevent illness, disease and health-related problems.    Medicare offers a yearly Wellness Visit which allows our clinical staff to assess your need for preventative services including immunizations, lifestyle education, counseling to decrease risk of preventable diseases and screening for fall risk and other medical concerns.    This visit is provided free of charge (no copay) for all Medicare recipients. The clinical pharmacists at [facility] have begun to conduct these Wellness Visits which will also include a thorough review of all your medications.    As you primary medical provider recommend that you make an appointment for your Annual Wellness Visit if you have not done so already this year.  You may set up this appointment before you leave today or you may call back ([phone]) and schedule an appointment.  Please make sure when you call that you mention that you are scheduling your Annual Wellness Visit with the clinical pharmacist so that the appointment may be made for the proper length of time.     Continue current medications. Continue good therapeutic lifestyle changes which include good diet and exercise. Fall precautions discussed with patient. If an FOBT was given today- please return it to our front desk. If you are over 45 years old - you may need Prevnar 2 or the adult Pneumonia vaccine.  **Flu shots are available--- please call and schedule a FLU-CLINIC appointment**  After your visit with [place] today you will receive a survey in the mail or online from [employer] regarding your care with [place]. Please take a moment to fill this out. Your feedback is very important to [place] as you can help [place] better understand your patient needs as well as improve your experience and satisfaction. WE CARE ABOUT YOU!!!   Continue to follow-up with urology because of prostate cancer.  We will we'll call with lab work results as soon as those results become  available Monitor blood sugars at home Take Tylenol for aches pains and fever and continue to work on getting weight down as this can help the back pain some. The flu shot that you received today may make your arm sore Check out the fitness center to see if you can get some personalized treatment help get started on an exercise program    Arrie Senate MD

## 2016-04-14 NOTE — Patient Instructions (Addendum)
Medicare Annual Wellness Visit  [facility] and the medical providers at [facility] strive to bring you the best medical care.  In doing so we not only want to address your current medical conditions and concerns but also to detect new conditions early and prevent illness, disease and health-related problems.    Medicare offers a yearly Wellness Visit which allows our clinical staff to assess your need for preventative services including immunizations, lifestyle education, counseling to decrease risk of preventable diseases and screening for fall risk and other medical concerns.    This visit is provided free of charge (no copay) for all Medicare recipients. The clinical pharmacists at [facility] have begun to conduct these Wellness Visits which will also include a thorough review of all your medications.    As you primary medical provider recommend that you make an appointment for your Annual Wellness Visit if you have not done so already this year.  You may set up this appointment before you leave today or you may call back [phone] and schedule an appointment.  Please make sure when you call that you mention that you are scheduling your Annual Wellness Visit with the clinical pharmacist so that the appointment may be made for the proper length of time.     Continue current medications. Continue good therapeutic lifestyle changes which include good diet and exercise. Fall precautions discussed with patient. If an FOBT was given today- please return it to our front desk. If you are over 75 years old - you may need Prevnar 76 or the adult Pneumonia vaccine.  **Flu shots are available--- please call and schedule a FLU-CLINIC appointment**  After your visit with [place] today you will receive a survey in the mail or online from [employer] regarding your care with [place]. Please take a moment to fill this out. Your feedback is very  important to [place] as you can help [place] better understand your patient needs as well as improve your experience and satisfaction. WE CARE ABOUT YOU!!!   Continue to follow-up with urology because of prostate cancer.  We will we'll call with lab work results as soon as those results become available Monitor blood sugars at home Take Tylenol for aches pains and fever and continue to work on getting weight down as this can help the back pain some. The flu shot that you received today may make your arm sore Check out the fitness center to see if you can get some personalized treatment help get started on an exercise program

## 2016-04-15 LAB — BMP8+EGFR
BUN/Creatinine Ratio: 17 (ref 10–24)
BUN: 17 mg/dL (ref 8–27)
CALCIUM: 9.5 mg/dL (ref 8.6–10.2)
CHLORIDE: 99 mmol/L (ref 96–106)
CO2: 26 mmol/L (ref 18–29)
Creatinine, Ser: 1.01 mg/dL (ref 0.76–1.27)
GFR calc non Af Amer: 74 mL/min/{1.73_m2} (ref >59)
GFR, EST AFRICAN AMERICAN: 86 mL/min/{1.73_m2} (ref >59)
GLUCOSE: 127 mg/dL — AB (ref 65–99)
POTASSIUM: 3.8 mmol/L (ref 3.5–5.2)
Sodium: 142 mmol/L (ref 134–144)

## 2016-04-15 LAB — CBC WITH DIFFERENTIAL/PLATELET
BASOS ABS: 0.1 10*3/uL (ref 0.0–0.2)
Basos: 1 %
EOS (ABSOLUTE): 0.2 10*3/uL (ref 0.0–0.4)
Eos: 3 %
HEMOGLOBIN: 12.5 g/dL — AB (ref 12.6–17.7)
Hematocrit: 37.5 % (ref 37.5–51.0)
IMMATURE GRANS (ABS): 0 10*3/uL (ref 0.0–0.1)
Immature Granulocytes: 0 %
LYMPHS: 25 %
Lymphocytes Absolute: 1.5 10*3/uL (ref 0.7–3.1)
MCH: 32.7 pg (ref 26.6–33.0)
MCHC: 33.3 g/dL (ref 31.5–35.7)
MCV: 98 fL — ABNORMAL HIGH (ref 79–97)
MONOCYTES: 9 %
Monocytes Absolute: 0.6 10*3/uL (ref 0.1–0.9)
NEUTROS ABS: 3.9 10*3/uL (ref 1.4–7.0)
Neutrophils: 62 %
PLATELETS: 242 10*3/uL (ref 150–379)
RBC: 3.82 x10E6/uL — AB (ref 4.14–5.80)
RDW: 13.2 % (ref 12.3–15.4)
WBC: 6.2 10*3/uL (ref 3.4–10.8)

## 2016-04-15 LAB — NMR, LIPOPROFILE
CHOLESTEROL: 103 mg/dL (ref 100–199)
HDL CHOLESTEROL BY NMR: 35 mg/dL — AB (ref >39)
HDL PARTICLE NUMBER: 25.2 umol/L — AB (ref >=30.5)
LDL PARTICLE NUMBER: 977 nmol/L (ref <1000)
LDL Size: 20.2 nm (ref >20.5)
LDL-C: 55 mg/dL (ref 0–99)
LP-IR SCORE: 54 — AB (ref <=45)
Small LDL Particle Number: 598 nmol/L — ABNORMAL HIGH (ref <=527)
Triglycerides by NMR: 65 mg/dL (ref 0–149)

## 2016-04-15 LAB — MICROALBUMIN / CREATININE URINE RATIO
Creatinine, Urine: 154 mg/dL
MICROALB/CREAT RATIO: 4.1 mg/g{creat} (ref 0.0–30.0)
MICROALBUM., U, RANDOM: 6.3 ug/mL

## 2016-04-15 LAB — HEPATIC FUNCTION PANEL
ALBUMIN: 4.4 g/dL (ref 3.5–4.8)
ALT: 21 IU/L (ref 0–44)
AST: 31 IU/L (ref 0–40)
Alkaline Phosphatase: 76 IU/L (ref 39–117)
BILIRUBIN, DIRECT: 0.22 mg/dL (ref 0.00–0.40)
Bilirubin Total: 0.8 mg/dL (ref 0.0–1.2)
TOTAL PROTEIN: 6.6 g/dL (ref 6.0–8.5)

## 2016-04-15 LAB — VITAMIN D 25 HYDROXY (VIT D DEFICIENCY, FRACTURES): VIT D 25 HYDROXY: 51.3 ng/mL (ref 30.0–100.0)

## 2016-04-20 ENCOUNTER — Other Ambulatory Visit: Payer: Self-pay | Admitting: *Deleted

## 2016-04-20 MED ORDER — CLONAZEPAM 0.5 MG PO TABS: 0.5000 mg | ORAL_TABLET | Freq: Two times a day (BID) | ORAL | 1 refills | Status: DC

## 2016-04-26 ENCOUNTER — Telehealth: Payer: Self-pay | Admitting: Family Medicine

## 2016-04-26 NOTE — Telephone Encounter (Signed)
Called pt and aware that he can change urology appt himself to what date works best for them

## 2016-05-03 ENCOUNTER — Other Ambulatory Visit: Payer: Self-pay | Admitting: Family Medicine

## 2016-05-14 DIAGNOSIS — E119 Type 2 diabetes mellitus without complications: Secondary | ICD-10-CM | POA: Diagnosis not present

## 2016-05-14 DIAGNOSIS — H5203 Hypermetropia, bilateral: Secondary | ICD-10-CM | POA: Diagnosis not present

## 2016-05-14 DIAGNOSIS — Z7984 Long term (current) use of oral hypoglycemic drugs: Secondary | ICD-10-CM | POA: Diagnosis not present

## 2016-05-14 DIAGNOSIS — H2513 Age-related nuclear cataract, bilateral: Secondary | ICD-10-CM | POA: Diagnosis not present

## 2016-05-14 LAB — HM DIABETES EYE EXAM

## 2016-05-21 ENCOUNTER — Other Ambulatory Visit: Payer: Self-pay | Admitting: Family Medicine

## 2016-05-25 ENCOUNTER — Other Ambulatory Visit: Payer: Self-pay | Admitting: Family Medicine

## 2016-05-27 ENCOUNTER — Other Ambulatory Visit: Payer: Self-pay | Admitting: Family Medicine

## 2016-06-28 ENCOUNTER — Other Ambulatory Visit: Payer: Self-pay | Admitting: Family Medicine

## 2016-07-16 ENCOUNTER — Other Ambulatory Visit: Payer: Medicare Other

## 2016-07-16 DIAGNOSIS — R972 Elevated prostate specific antigen [PSA]: Secondary | ICD-10-CM | POA: Diagnosis not present

## 2016-07-17 LAB — PSA, TOTAL AND FREE

## 2016-07-26 DIAGNOSIS — N393 Stress incontinence (female) (male): Secondary | ICD-10-CM | POA: Diagnosis not present

## 2016-07-26 DIAGNOSIS — Z87442 Personal history of urinary calculi: Secondary | ICD-10-CM | POA: Diagnosis not present

## 2016-07-26 DIAGNOSIS — Z8546 Personal history of malignant neoplasm of prostate: Secondary | ICD-10-CM | POA: Diagnosis not present

## 2016-08-17 ENCOUNTER — Ambulatory Visit (INDEPENDENT_AMBULATORY_CARE_PROVIDER_SITE_OTHER): Payer: Medicare Other | Admitting: Family Medicine

## 2016-08-17 ENCOUNTER — Encounter: Payer: Self-pay | Admitting: Family Medicine

## 2016-08-17 VITALS — BP 134/62 | HR 66 | Temp 96.9°F | Ht 64.0 in | Wt 207.0 lb

## 2016-08-17 DIAGNOSIS — E559 Vitamin D deficiency, unspecified: Secondary | ICD-10-CM | POA: Diagnosis not present

## 2016-08-17 DIAGNOSIS — C61 Malignant neoplasm of prostate: Secondary | ICD-10-CM | POA: Diagnosis not present

## 2016-08-17 DIAGNOSIS — E7800 Pure hypercholesterolemia, unspecified: Secondary | ICD-10-CM | POA: Diagnosis not present

## 2016-08-17 DIAGNOSIS — E119 Type 2 diabetes mellitus without complications: Secondary | ICD-10-CM | POA: Diagnosis not present

## 2016-08-17 DIAGNOSIS — M4726 Other spondylosis with radiculopathy, lumbar region: Secondary | ICD-10-CM

## 2016-08-17 DIAGNOSIS — D509 Iron deficiency anemia, unspecified: Secondary | ICD-10-CM | POA: Diagnosis not present

## 2016-08-17 DIAGNOSIS — I10 Essential (primary) hypertension: Secondary | ICD-10-CM

## 2016-08-17 DIAGNOSIS — I700 Atherosclerosis of aorta: Secondary | ICD-10-CM

## 2016-08-17 LAB — BAYER DCA HB A1C WAIVED: HB A1C (BAYER DCA - WAIVED): 7.2 % — ABNORMAL HIGH (ref <7.0)

## 2016-08-17 NOTE — Patient Instructions (Addendum)
Medicare Annual Wellness Visit  [facility] and the medical providers at [facility] strive to bring you the best medical care.  In doing so we not only want to address your current medical conditions and concerns but also to detect new conditions early and prevent illness, disease and health-related problems.    Medicare offers a yearly Wellness Visit which allows our clinical staff to assess your need for preventative services including immunizations, lifestyle education, counseling to decrease risk of preventable diseases and screening for fall risk and other medical concerns.    This visit is provided free of charge (no copay) for all Medicare recipients. The clinical pharmacists at [facility] have begun to conduct these Wellness Visits which will also include a thorough review of all your medications.    As you primary medical provider recommend that you make an appointment for your Annual Wellness Visit if you have not done so already this year.  You may set up this appointment before you leave today or you may call back [phone] and schedule an appointment.  Please make sure when you call that you mention that you are scheduling your Annual Wellness Visit with the clinical pharmacist so that the appointment may be made for the proper length of time.     Continue current medications. Continue good therapeutic lifestyle changes which include good diet and exercise. Fall precautions discussed with patient. If an FOBT was given today- please return it to our front desk. If you are over 9 years old - you may need Prevnar 45 or the adult Pneumonia vaccine.  **Flu shots are available--- please call and schedule a FLU-CLINIC appointment**  After your visit with [place] today you will receive a survey in the mail or online from [employer] regarding your care with [place]. Please take a moment to fill this out. Your feedback is very  important to [place] as you can help [place] better understand your patient needs as well as improve your experience and satisfaction. WE CARE ABOUT YOU!!!  Continue with aggressive therapeutic lifestyle changes and make all efforts to prevent from falling  Try ranitidine or Zantac 150 over the counter, the equate brand and take one twice daily before breakfast and supper. If this does not work call us and we will call in the 300 mg size that you would take twice daily. For the rest of the winter and the flu season practice good pulmonary hygiene and hand hygiene [place] to monitor blood sugars at home and check feet.

## 2016-08-17 NOTE — Progress Notes (Signed)
Subjective:    Patient ID: George Wang, male    DOB: April 15, 1946, 71 y.o.   MRN: [id number]  HPI Pt here for follow up and management of chronic medical problems which includes diabetes, hyperlipidemia and hypertension. He is taking medication regularly.The patient is concerned about some left rib pain following a fall on January 1 of this year. He also wants to discuss the continued use of his proton pump inhibitor. The patient is pleasant and doing well. He denies any chest pain pressure palpitations or shortness of breath. In the past he did try the ranitidine but did have some occasional breakthrough heartburn and he is now back on his omeprazole. He has no heartburn with this. He denies any blood in the stool or black tarry bowel movements or change in bowel habits. He denies any trouble with passing his water. He still follows up periodically with the urologist because of the prostate cancer. On January 1 he did fall backward and landed on his left side and had some left rib pain which is getting better.    Patient Active Problem List   Diagnosis Date Noted  . Morbid obesity ([place]) 11/21/2015  . Hypertension 02/28/2013  . Hyperlipemia 02/28/2013  . Type 2 diabetes mellitus ([place]) 02/28/2013  . Vitamin D deficiency 02/28/2013  . Overweight(278.02) 10/13/2011  . PVC (premature ventricular contraction) 10/13/2011  . HEMOCCULT POSITIVE STOOL 08/14/2008  . Iron deficiency anemia 06/27/2008  . GOUT 04/25/2008  . ANXIETY 04/25/2008  . Depression 04/25/2008  . DIVERTICULOSIS, COLON 04/25/2008  . IRRITABLE BOWEL SYNDROME 04/25/2008  . NEPHROLITHIASIS 04/25/2008  . PROSTATE CANCER, HX OF 04/25/2008   Outpatient Encounter Prescriptions as of 08/17/2016  Medication Sig  . acetaminophen (TYLENOL) 325 MG tablet Take 650 mg by mouth every 6 (six) hours as needed for mild pain.  Marland Kitchen amLODipine (NORVASC) 10 MG tablet TAKE ONE (1) TABLET EACH DAY  . atorvastatin (LIPITOR) 80 MG tablet TAKE  ONE (1) TABLET EACH DAY  . benazepril (LOTENSIN) 20 MG tablet TAKE ONE (1) TABLET EACH DAY  . beta carotene 25000 UNIT capsule Take 25,000 Units by mouth daily.  . Cholecalciferol (VITAMIN D) 2000 UNITS CAPS Take 1 capsule by mouth daily.  . clonazePAM (KLONOPIN) 0.5 MG tablet Take 1 tablet (0.5 mg total) by mouth 2 (two) times daily.  Marland Kitchen FeFum-FePo-FA-B Cmp-C-Zn-Mn-Cu (SE-TAN PLUS) 162-115.2-1 MG CAPS TAKE ONE CAPSULE BY MOUTH TWICE A DAY  . Fiber POWD Take by mouth.  . fish oil-omega-3 fatty acids 1000 MG capsule Take 2 g by mouth daily.  Marland Kitchen glimepiride (AMARYL) 4 MG tablet TAKE 1/2 TABLET TWICE DAILY  . losartan-hydrochlorothiazide (HYZAAR) 100-25 MG tablet TAKE ONE (1) TABLET EACH DAY  . metFORMIN (GLUCOPHAGE) 500 MG tablet TAKE ONE TABLET BY MOUTH TWICE DAILY  . Multiple Vitamins-Minerals (CENTRUM SILVER PO) Take 1 tablet by mouth daily.   Marland Kitchen omeprazole (PRILOSEC) 20 MG capsule TAKE ONE (1) CAPSULE EACH DAY  . PARoxetine (PAXIL) 40 MG tablet TAKE ONE (1) TABLET EACH DAY   No facility-administered encounter medications on file as of 08/17/2016.       Review of Systems  Constitutional: Negative.   HENT: Negative.   Eyes: Negative.   Respiratory: Negative.   Cardiovascular: Negative.   Gastrointestinal: Negative.   Endocrine: Negative.   Genitourinary: Negative.   Musculoskeletal: Positive for arthralgias (left side rib pain - fall 07/19/16), back pain and neck pain.  Skin: Negative.   Allergic/Immunologic: Negative.   Neurological: Negative.   Hematological:  Negative.   Psychiatric/Behavioral: Negative.        Objective:   Physical Exam  Constitutional: He is oriented to person, place, and time. He appears well-developed and well-nourished. No distress.  HENT:  Head: Normocephalic and atraumatic.  Right Ear: External ear normal.  Left Ear: External ear normal.  Nose: Nose normal.  Mouth/Throat: Oropharynx is clear and moist. No oropharyngeal exudate.  Eyes: Conjunctivae and  EOM are normal. Pupils are equal, round, and reactive to light. Right eye exhibits no discharge. Left eye exhibits no discharge. No scleral icterus.  Neck: Normal range of motion. Neck supple. No thyromegaly present.  Cardiovascular: Normal rate, regular rhythm, normal heart sounds and intact distal pulses.   No murmur heard. The heart had a regular rate and rhythm at 72/m  Pulmonary/Chest: Effort normal and breath sounds normal. No respiratory distress. He has no wheezes. He has no rales. He exhibits no tenderness.  No axillary adenopathy Clear anteriorly and posteriorly  Abdominal: Soft. Bowel sounds are normal. He exhibits no mass. There is no tenderness. There is no rebound and no guarding.  Obesity without masses tenderness or organ enlargement or bruits  Genitourinary:  Genitourinary Comments: The patient sees the urologist regularly on a yearly basis. He just saw him this month and was cleared for another year.  Musculoskeletal: Normal range of motion. He exhibits no edema.  Lymphadenopathy:    He has no cervical adenopathy.  Neurological: He is alert and oriented to person, place, and time. He has normal reflexes. No cranial nerve deficit.  Skin: Skin is warm and dry. No rash noted.  Psychiatric: He has a normal mood and affect. His behavior is normal. Judgment and thought content normal.  Nursing note and vitals reviewed.   BP 134/62 (BP Location: Left Arm)   Pulse 66   Temp (!) 96.9 F (36.1 C) (Oral)   Ht '5\' 4"'$  (1.626 m)   Wt 207 lb (93.9 kg)   BMI 35.53 kg/m        Assessment & Plan:  1. Essential hypertension -The blood pressure is good today and he will continue with current treatment - BMP8+EGFR - CBC with Differential/Platelet - Hepatic function panel  2. Pure hypercholesterolemia -Continue with current treatment pending results of lab work - CBC with Differential/Platelet - NMR, lipoprofile  3. Type 2 diabetes mellitus without complication, without  long-term current use of insulin ([place]) -Continue with current treatment and aggressive therapeutic lifestyle changes pending results of lab work - CBC with Differential/Platelet - Bayer [place] Hb A1c Waived  4. Vitamin D deficiency -Continue with vitamin D replacement pending results of lab work - CBC with Differential/Platelet - VITAMIN D 25 Hydroxy (Vit-D Deficiency, Fractures)  5. Prostate cancer [facility]) -Continue follow-up with urologist on a yearly basis as planned. This visit occurs in January. - CBC with Differential/Platelet  6. Iron deficiency anemia, unspecified iron deficiency anemia type -Continue iron replacement  7. Osteoarthritis of spine with radiculopathy, lumbar region -Continue to be careful and do not put yourself at risk for falling take Tylenol for aches pains and fever and avoid all NSAIDs like ibuprofen and Aleve  8. Thoracic aortic atherosclerosis ([place]) -Continue with aggressive therapeutic lifestyle changes which include diet and exercise and current treatment for cholesterol pending results of lab work  Patient Instructions                       Medicare Annual Wellness Visit  [facility] and the medical providers  at [facility] strive to bring you the best medical care.  In doing so we not only want to address your current medical conditions and concerns but also to detect new conditions early and prevent illness, disease and health-related problems.    Medicare offers a yearly Wellness Visit which allows our clinical staff to assess your need for preventative services including immunizations, lifestyle education, counseling to decrease risk of preventable diseases and screening for fall risk and other medical concerns.    This visit is provided free of charge (no copay) for all Medicare recipients. The clinical pharmacists at [facility] have begun to conduct these Wellness Visits which will also include a thorough  review of all your medications.    As you primary medical provider recommend that you make an appointment for your Annual Wellness Visit if you have not done so already this year.  You may set up this appointment before you leave today or you may call back [phone]) and schedule an appointment.  Please make sure when you call that you mention that you are scheduling your Annual Wellness Visit with the clinical pharmacist so that the appointment may be made for the proper length of time.     Continue current medications. Continue good therapeutic lifestyle changes which include good diet and exercise. Fall precautions discussed with patient. If an FOBT was given today- please return it to our front desk. If you are over 60 years old - you may need Prevnar 56 or the adult Pneumonia vaccine.  **Flu shots are available--- please call and schedule a FLU-CLINIC appointment**  After your visit with [place] today you will receive a survey in the mail or online from [employer] regarding your care with [place]. Please take a moment to fill this out. Your feedback is very important to [place] as you can help [place] better understand your patient needs as well as improve your experience and satisfaction. WE CARE ABOUT YOU!!!  Continue with aggressive therapeutic lifestyle changes and make all efforts to prevent from falling  Try ranitidine or Zantac 150 over the counter, the equate brand and take one twice daily before breakfast and supper. If this does not work call us and we will call in the 300 mg size that you would take twice daily. For the rest of the winter and the flu season practice good pulmonary hygiene and hand hygiene [place] to monitor blood sugars at home and check feet.   Arrie Senate MD

## 2016-08-18 LAB — NMR, LIPOPROFILE
Cholesterol: 110 mg/dL (ref 100–199)
HDL Cholesterol by NMR: 34 mg/dL — ABNORMAL LOW (ref >39)
HDL Particle Number: 29.3 umol/L — ABNORMAL LOW (ref >=30.5)
LDL PARTICLE NUMBER: 989 nmol/L (ref <1000)
LDL SIZE: 20.4 nm (ref >20.5)
LDL-C: 60 mg/dL (ref 0–99)
LP-IR SCORE: 64 — AB (ref <=45)
Small LDL Particle Number: 531 nmol/L — ABNORMAL HIGH (ref <=527)
TRIGLYCERIDES BY NMR: 82 mg/dL (ref 0–149)

## 2016-08-18 LAB — CBC WITH DIFFERENTIAL/PLATELET
BASOS: 1 %
Basophils Absolute: 0.1 10*3/uL (ref 0.0–0.2)
EOS (ABSOLUTE): 0.2 10*3/uL (ref 0.0–0.4)
Eos: 3 %
HEMATOCRIT: 36.9 % — AB (ref 37.5–51.0)
HEMOGLOBIN: 12.1 g/dL — AB (ref 13.0–17.7)
Immature Grans (Abs): 0 10*3/uL (ref 0.0–0.1)
Immature Granulocytes: 0 %
LYMPHS ABS: 1.6 10*3/uL (ref 0.7–3.1)
Lymphs: 21 %
MCH: 32 pg (ref 26.6–33.0)
MCHC: 32.8 g/dL (ref 31.5–35.7)
MCV: 98 fL — AB (ref 79–97)
MONOS ABS: 0.7 10*3/uL (ref 0.1–0.9)
Monocytes: 9 %
Neutrophils Absolute: 4.9 10*3/uL (ref 1.4–7.0)
Neutrophils: 66 %
Platelets: 233 10*3/uL (ref 150–379)
RBC: 3.78 x10E6/uL — ABNORMAL LOW (ref 4.14–5.80)
RDW: 13.3 % (ref 12.3–15.4)
WBC: 7.5 10*3/uL (ref 3.4–10.8)

## 2016-08-18 LAB — HEPATIC FUNCTION PANEL
ALK PHOS: 73 IU/L (ref 39–117)
ALT: 21 IU/L (ref 0–44)
AST: 28 IU/L (ref 0–40)
Albumin: 4.2 g/dL (ref 3.5–4.8)
BILIRUBIN, DIRECT: 0.2 mg/dL (ref 0.00–0.40)
Bilirubin Total: 0.7 mg/dL (ref 0.0–1.2)
Total Protein: 6.3 g/dL (ref 6.0–8.5)

## 2016-08-18 LAB — BMP8+EGFR
BUN/Creatinine Ratio: 15 (ref 10–24)
BUN: 14 mg/dL (ref 8–27)
CO2: 28 mmol/L (ref 18–29)
Calcium: 9.4 mg/dL (ref 8.6–10.2)
Chloride: 100 mmol/L (ref 96–106)
Creatinine, Ser: 0.95 mg/dL (ref 0.76–1.27)
GFR calc Af Amer: 92 mL/min/{1.73_m2} (ref >59)
GFR, EST NON AFRICAN AMERICAN: 80 mL/min/{1.73_m2} (ref >59)
GLUCOSE: 141 mg/dL — AB (ref 65–99)
POTASSIUM: 4 mmol/L (ref 3.5–5.2)
SODIUM: 142 mmol/L (ref 134–144)

## 2016-08-18 LAB — VITAMIN D 25 HYDROXY (VIT D DEFICIENCY, FRACTURES): VIT D 25 HYDROXY: 52.6 ng/mL (ref 30.0–100.0)

## 2016-08-24 ENCOUNTER — Other Ambulatory Visit: Payer: Self-pay | Admitting: Family Medicine

## 2016-08-26 ENCOUNTER — Other Ambulatory Visit: Payer: Self-pay | Admitting: Family Medicine

## 2016-09-24 ENCOUNTER — Other Ambulatory Visit: Payer: Self-pay | Admitting: Family Medicine

## 2016-10-14 ENCOUNTER — Other Ambulatory Visit: Payer: Self-pay | Admitting: Family Medicine

## 2016-10-14 NOTE — Telephone Encounter (Signed)
Last filled and seen 08/16/16. Call in

## 2016-10-22 ENCOUNTER — Other Ambulatory Visit: Payer: Self-pay | Admitting: Family Medicine

## 2016-10-25 ENCOUNTER — Other Ambulatory Visit: Payer: Self-pay | Admitting: Family Medicine

## 2016-10-28 ENCOUNTER — Other Ambulatory Visit: Payer: Self-pay | Admitting: Family Medicine

## 2016-11-02 ENCOUNTER — Telehealth: Payer: Self-pay

## 2016-11-02 NOTE — Telephone Encounter (Signed)
Not familiar with this medicine?

## 2016-11-02 NOTE — Telephone Encounter (Signed)
It is a generic iron supplement - similar to Hemocyte or Ferrocite.  No covered by insurance because likely considered OTC.  You can try asking if there are other iron supplements that are covered.  Other Rx options to try include Hemax Caplets, Niferex or Foltrin capsules

## 2016-11-03 NOTE — Telephone Encounter (Signed)
With Aaron Edelman and he states the this has never been covered and they should not have sent a PA to [place] - the pt pays out of pocket for this and will continue so.

## 2016-11-03 NOTE — Telephone Encounter (Signed)
Please discuss with [facility] and patient and get recommendations from his pharmacist

## 2016-12-30 ENCOUNTER — Encounter: Payer: Self-pay | Admitting: Family Medicine

## 2016-12-30 ENCOUNTER — Ambulatory Visit (INDEPENDENT_AMBULATORY_CARE_PROVIDER_SITE_OTHER): Payer: Medicare Other | Admitting: Family Medicine

## 2016-12-30 VITALS — BP 113/62 | HR 84 | Temp 97.2°F | Ht 64.0 in | Wt 198.0 lb

## 2016-12-30 DIAGNOSIS — E119 Type 2 diabetes mellitus without complications: Secondary | ICD-10-CM | POA: Diagnosis not present

## 2016-12-30 DIAGNOSIS — E7800 Pure hypercholesterolemia, unspecified: Secondary | ICD-10-CM | POA: Diagnosis not present

## 2016-12-30 DIAGNOSIS — D509 Iron deficiency anemia, unspecified: Secondary | ICD-10-CM

## 2016-12-30 DIAGNOSIS — R5383 Other fatigue: Secondary | ICD-10-CM | POA: Diagnosis not present

## 2016-12-30 DIAGNOSIS — I700 Atherosclerosis of aorta: Secondary | ICD-10-CM

## 2016-12-30 DIAGNOSIS — I10 Essential (primary) hypertension: Secondary | ICD-10-CM

## 2016-12-30 DIAGNOSIS — C61 Malignant neoplasm of prostate: Secondary | ICD-10-CM | POA: Diagnosis not present

## 2016-12-30 DIAGNOSIS — E559 Vitamin D deficiency, unspecified: Secondary | ICD-10-CM

## 2016-12-30 LAB — BAYER DCA HB A1C WAIVED: HB A1C (BAYER DCA - WAIVED): 6.6 % (ref <7.0)

## 2016-12-30 MED ORDER — METFORMIN HCL 500 MG PO TABS: ORAL_TABLET | ORAL | 3 refills | Status: DC

## 2016-12-30 NOTE — Patient Instructions (Addendum)
Medicare Annual Wellness Visit  [facility] and the medical providers at [facility] strive to bring you the best medical care.  In doing so we not only want to address your current medical conditions and concerns but also to detect new conditions early and prevent illness, disease and health-related problems.    Medicare offers a yearly Wellness Visit which allows our clinical staff to assess your need for preventative services including immunizations, lifestyle education, counseling to decrease risk of preventable diseases and screening for fall risk and other medical concerns.    This visit is provided free of charge (no copay) for all Medicare recipients. The clinical pharmacists at [facility] have begun to conduct these Wellness Visits which will also include a thorough review of all your medications.    As you primary medical provider recommend that you make an appointment for your Annual Wellness Visit if you have not done so already this year.  You may set up this appointment before you leave today or you may call back [phone]) and schedule an appointment.  Please make sure when you call that you mention that you are scheduling your Annual Wellness Visit with the clinical pharmacist so that the appointment may be made for the proper length of time.     Continue current medications. Continue good therapeutic lifestyle changes which include good diet and exercise. Fall precautions discussed with patient. If an FOBT was given today- please return it to our front desk. If you are over 4 years old - you may need Prevnar 35 or the adult Pneumonia vaccine.  **Flu shots are available--- please call and schedule a FLU-CLINIC appointment**  After your visit with [place] today you will receive a survey in the mail or online from [employer] regarding your care with [place]. Please take a moment to fill this out. Your feedback is very  important to [place] as you can help [place] better understand your patient needs as well as improve your experience and satisfaction. WE CARE ABOUT YOU!!!   Make all efforts to not put yourself at risk for falling Get a new meter and check on the new FreeStyle meter that you apply to your arm and see if your insurance will pay for this Stay active physically Make all efforts to lose weight through diet and exercise The summer drink plenty of fluids and stay well hydrated

## 2016-12-30 NOTE — Progress Notes (Signed)
Subjective:    Patient ID: George Wang, male    DOB: January 19, 1946, 71 y.o.   MRN: [id number]  HPI Pt here for follow up and management of chronic medical problems which includes hyperlipidemia, hypertension, and diabetes. He is taking medication regularly. The patient complains of some recent problems with elevated blood sugar. He also complains of some fatigue in the morning. He will get an FOBT to return. He'll also get lab work today. I had spoken to the patient on the phone recently and had him to increase his metformin to take the thousand and the morning and 500 in the evening. We'll wait till the A1c comes back to make further changes with his blood sugar. The patient did have his meter checked and his sugars were a lot higher than the meter here he plans to get a new meter. He will check into the new FreeStyle meter and see if his insurance will pay for that. He denies any chest pain or shortness of breath. He denies any trouble with his stoma other than occasional diarrhea secondary to certain foods. Otherwise he's had no nausea vomiting blood in the stool or black tarry bowel movements. He does see the urologist regularly every year in January for his rectal exam and PSA.    Patient Active Problem List   Diagnosis Date Noted  . Morbid obesity ([place]) 11/21/2015  . Hypertension 02/28/2013  . Hyperlipemia 02/28/2013  . Type 2 diabetes mellitus ([place]) 02/28/2013  . Vitamin D deficiency 02/28/2013  . Overweight(278.02) 10/13/2011  . PVC (premature ventricular contraction) 10/13/2011  . HEMOCCULT POSITIVE STOOL 08/14/2008  . Iron deficiency anemia 06/27/2008  . GOUT 04/25/2008  . ANXIETY 04/25/2008  . Depression 04/25/2008  . DIVERTICULOSIS, COLON 04/25/2008  . IRRITABLE BOWEL SYNDROME 04/25/2008  . NEPHROLITHIASIS 04/25/2008  . PROSTATE CANCER, HX OF 04/25/2008   Outpatient Encounter Prescriptions as of 12/30/2016  Medication Sig  . acetaminophen (TYLENOL) 325 MG tablet Take  650 mg by mouth every 6 (six) hours as needed for mild pain.  Marland Kitchen amLODipine (NORVASC) 10 MG tablet TAKE ONE (1) TABLET EACH DAY  . atorvastatin (LIPITOR) 80 MG tablet TAKE ONE (1) TABLET EACH DAY  . benazepril (LOTENSIN) 20 MG tablet TAKE ONE (1) TABLET EACH DAY  . beta carotene 25000 UNIT capsule Take 25,000 Units by mouth daily.  . Cholecalciferol (VITAMIN D) 2000 UNITS CAPS Take 1 capsule by mouth daily.  . clonazePAM (KLONOPIN) 0.5 MG tablet TAKE ONE TABLET BY MOUTH TWICE DAILY  . FeFum-FePo-FA-B Cmp-C-Zn-Mn-Cu (SE-TAN PLUS) 162-115.2-1 MG CAPS TAKE ONE CAPSULE BY MOUTH TWICE A DAY  . Fiber POWD Take by mouth.  . fish oil-omega-3 fatty acids 1000 MG capsule Take 2 g by mouth daily.  Marland Kitchen glimepiride (AMARYL) 4 MG tablet TAKE 1/2 TABLET TWICE DAILY  . losartan-hydrochlorothiazide (HYZAAR) 100-25 MG tablet TAKE ONE (1) TABLET EACH DAY  . metFORMIN (GLUCOPHAGE) 500 MG tablet TAKE ONE TABLET BY MOUTH TWICE DAILY  . Multiple Vitamins-Minerals (CENTRUM SILVER PO) Take 1 tablet by mouth daily.   Marland Kitchen omeprazole (PRILOSEC) 20 MG capsule TAKE ONE (1) CAPSULE EACH DAY  . PARoxetine (PAXIL) 40 MG tablet TAKE ONE (1) TABLET EACH DAY   No facility-administered encounter medications on file as of 12/30/2016.       Review of Systems  Constitutional: Positive for fatigue.  HENT: Negative.   Eyes: Negative.   Respiratory: Negative.   Cardiovascular: Negative.   Gastrointestinal: Negative.   Endocrine: Negative.  Elevated BS  Genitourinary: Negative.   Musculoskeletal: Negative.   Skin: Negative.   Allergic/Immunologic: Negative.   Neurological: Negative.   Hematological: Negative.   Psychiatric/Behavioral: Negative.        Objective:   Physical Exam  Constitutional: He is oriented to person, place, and time. He appears well-developed and well-nourished.  The patient is pleasant and calm and relaxed.  HENT:  Head: Normocephalic and atraumatic.  Nose: Nose normal.  Mouth/Throat:  Oropharynx is clear and moist. No oropharyngeal exudate.  Bilateral ears cerumen  Eyes: Conjunctivae and EOM are normal. Pupils are equal, round, and reactive to light. Right eye exhibits no discharge. Left eye exhibits no discharge. No scleral icterus.  Neck: Normal range of motion. Neck supple. No thyromegaly present.  No bruits thyromegaly or anterior cervical adenopathy  Cardiovascular: Normal rate, regular rhythm, normal heart sounds and intact distal pulses.  Exam reveals no friction rub.   No murmur heard. Heart is regular at 84/m  Pulmonary/Chest: Effort normal and breath sounds normal. No respiratory distress. He has no wheezes. He has no rales. He exhibits no tenderness.  Clear anteriorly and posteriorly and no axillary adenopathy  Abdominal: Soft. Bowel sounds are normal. He exhibits no mass. There is no tenderness. There is no rebound and no guarding.  Abdominal obesity without masses tenderness or organ enlargement or bruits  Genitourinary:  Genitourinary Comments: This is followed regularly every 6 months by his urologist because of prostate cancer  Musculoskeletal: Normal range of motion. He exhibits no edema.  The patient has been doing well with his neck and back and has no current problems with this.  Lymphadenopathy:    He has no cervical adenopathy.  Neurological: He is alert and oriented to person, place, and time. He has normal reflexes. No cranial nerve deficit.  Skin: Skin is warm and dry. No rash noted.  Psychiatric: He has a normal mood and affect. His behavior is normal. Judgment and thought content normal.  Nursing note and vitals reviewed.  BP 113/62 (BP Location: Left Arm)   Pulse 84   Temp 97.2 F (36.2 C) (Oral)   Ht 5' 4"  (1.626 m)   Wt 198 lb (89.8 kg)   BMI 33.99 kg/m         Assessment & Plan:  1. Essential hypertension -The blood pressure is good today and he will continue with current treatment - BMP8+EGFR - CBC with  Differential/Platelet - Hepatic function panel  2. Pure hypercholesterolemia -Continue with current treatment pending results of lab work - CBC with Differential/Platelet - NMR, lipoprofile  3. Type 2 diabetes mellitus without complication, without long-term current use of insulin ([place]) -Get a new meter and monitor blood sugars closely and bring these readings by for review in a couple of weeks - CBC with Differential/Platelet - Bayer DCA Hb A1c Waived  4. Vitamin D deficiency -Continue with current treatment pending results of lab work - CBC with Differential/Platelet - VITAMIN D 25 Hydroxy (Vit-D Deficiency, Fractures)  5. Prostate cancer ([place]) -Continue to follow-up with urology every 6 months - CBC with Differential/Platelet  6. Iron deficiency anemia, unspecified iron deficiency anemia type -No active bleeding noted - CBC with Differential/Platelet  7. Thoracic aortic atherosclerosis ([place]) -Continue with aggressive therapeutic lifestyle changes and statin drug and weight loss through diet and exercise - CBC with Differential/Platelet  8. Other fatigue - Thyroid Panel With TSH  9. Morbid obesity ([place]) -The patient has a BMI of 35.6. He is classified is morbidly  obese because he has 2 more comorbidities risk factors including hyperlipidemia hypertension and diabetes. He will make every effort to watch his diet more closely and eat less sugar and drink more water.  Patient Instructions                       Medicare Annual Wellness Visit  [facility] and the medical providers at [facility] strive to bring you the best medical care.  In doing so we not only want to address your current medical conditions and concerns but also to detect new conditions early and prevent illness, disease and health-related problems.    Medicare offers a yearly Wellness Visit which allows our clinical staff to assess your need for preventative services including  immunizations, lifestyle education, counseling to decrease risk of preventable diseases and screening for fall risk and other medical concerns.    This visit is provided free of charge (no copay) for all Medicare recipients. The clinical pharmacists at [facility] have begun to conduct these Wellness Visits which will also include a thorough review of all your medications.    As you primary medical provider recommend that you make an appointment for your Annual Wellness Visit if you have not done so already this year.  You may set up this appointment before you leave today or you may call back ([phone]) and schedule an appointment.  Please make sure when you call that you mention that you are scheduling your Annual Wellness Visit with the clinical pharmacist so that the appointment may be made for the proper length of time.     Continue current medications. Continue good therapeutic lifestyle changes which include good diet and exercise. Fall precautions discussed with patient. If an FOBT was given today- please return it to our front desk. If you are over 62 years old - you may need Prevnar 20 or the adult Pneumonia vaccine.  **Flu shots are available--- please call and schedule a FLU-CLINIC appointment**  After your visit with [place] today you will receive a survey in the mail or online from [employer] regarding your care with [place]. Please take a moment to fill this out. Your feedback is very important to [place] as you can help [place] better understand your patient needs as well as improve your experience and satisfaction. WE CARE ABOUT YOU!!!   Make all efforts to not put yourself at risk for falling Get a new meter and check on the new FreeStyle meter that you apply to your arm and see if your insurance will pay for this Stay active physically Make all efforts to lose weight through diet and exercise The summer drink plenty of fluids and stay well hydrated    Arrie Senate  MD

## 2016-12-31 LAB — CBC WITH DIFFERENTIAL/PLATELET
BASOS ABS: 0.1 10*3/uL (ref 0.0–0.2)
Basos: 1 %
EOS (ABSOLUTE): 0.2 10*3/uL (ref 0.0–0.4)
EOS: 3 %
HEMOGLOBIN: 12 g/dL — AB (ref 13.0–17.7)
Hematocrit: 34.8 % — ABNORMAL LOW (ref 37.5–51.0)
IMMATURE GRANULOCYTES: 0 %
Immature Grans (Abs): 0 10*3/uL (ref 0.0–0.1)
LYMPHS ABS: 1.8 10*3/uL (ref 0.7–3.1)
Lymphs: 32 %
MCH: 34.2 pg — ABNORMAL HIGH (ref 26.6–33.0)
MCHC: 34.5 g/dL (ref 31.5–35.7)
MCV: 99 fL — ABNORMAL HIGH (ref 79–97)
MONOCYTES: 10 %
MONOS ABS: 0.5 10*3/uL (ref 0.1–0.9)
NEUTROS PCT: 54 %
Neutrophils Absolute: 3 10*3/uL (ref 1.4–7.0)
Platelets: 237 10*3/uL (ref 150–379)
RBC: 3.51 x10E6/uL — AB (ref 4.14–5.80)
RDW: 13.2 % (ref 12.3–15.4)
WBC: 5.5 10*3/uL (ref 3.4–10.8)

## 2016-12-31 LAB — BMP8+EGFR
BUN / CREAT RATIO: 13 (ref 10–24)
BUN: 13 mg/dL (ref 8–27)
CHLORIDE: 100 mmol/L (ref 96–106)
CO2: 28 mmol/L (ref 20–29)
CREATININE: 1.03 mg/dL (ref 0.76–1.27)
Calcium: 9.5 mg/dL (ref 8.6–10.2)
GFR calc Af Amer: 84 mL/min/{1.73_m2} (ref >59)
GFR calc non Af Amer: 72 mL/min/{1.73_m2} (ref >59)
GLUCOSE: 106 mg/dL — AB (ref 65–99)
Potassium: 3.9 mmol/L (ref 3.5–5.2)
Sodium: 143 mmol/L (ref 134–144)

## 2016-12-31 LAB — NMR, LIPOPROFILE
Cholesterol: 91 mg/dL — ABNORMAL LOW (ref 100–199)
HDL CHOLESTEROL BY NMR: 34 mg/dL — AB (ref >39)
HDL PARTICLE NUMBER: 26.9 umol/L — AB (ref >=30.5)
LDL Particle Number: 811 nmol/L (ref <1000)
LDL Size: 20 nm (ref >20.5)
LDL-C: 43 mg/dL (ref 0–99)
LP-IR Score: 55 — ABNORMAL HIGH (ref <=45)
SMALL LDL PARTICLE NUMBER: 507 nmol/L (ref <=527)
Triglycerides by NMR: 70 mg/dL (ref 0–149)

## 2016-12-31 LAB — HEPATIC FUNCTION PANEL
ALK PHOS: 62 IU/L (ref 39–117)
ALT: 26 IU/L (ref 0–44)
AST: 37 IU/L (ref 0–40)
Albumin: 4.6 g/dL (ref 3.5–4.8)
BILIRUBIN, DIRECT: 0.21 mg/dL (ref 0.00–0.40)
Bilirubin Total: 0.8 mg/dL (ref 0.0–1.2)
Total Protein: 6.3 g/dL (ref 6.0–8.5)

## 2016-12-31 LAB — THYROID PANEL WITH TSH
Free Thyroxine Index: 2.1 (ref 1.2–4.9)
T3 UPTAKE RATIO: 27 % (ref 24–39)
T4 TOTAL: 7.7 ug/dL (ref 4.5–12.0)
TSH: 0.791 u[IU]/mL (ref 0.450–4.500)

## 2016-12-31 LAB — VITAMIN D 25 HYDROXY (VIT D DEFICIENCY, FRACTURES): Vit D, 25-Hydroxy: 48.7 ng/mL (ref 30.0–100.0)

## 2017-01-10 ENCOUNTER — Other Ambulatory Visit: Payer: Self-pay | Admitting: Family Medicine

## 2017-01-12 DIAGNOSIS — L814 Other melanin hyperpigmentation: Secondary | ICD-10-CM | POA: Diagnosis not present

## 2017-01-12 DIAGNOSIS — L570 Actinic keratosis: Secondary | ICD-10-CM | POA: Diagnosis not present

## 2017-01-12 DIAGNOSIS — D225 Melanocytic nevi of trunk: Secondary | ICD-10-CM | POA: Diagnosis not present

## 2017-01-12 DIAGNOSIS — Z85828 Personal history of other malignant neoplasm of skin: Secondary | ICD-10-CM | POA: Diagnosis not present

## 2017-01-12 DIAGNOSIS — L821 Other seborrheic keratosis: Secondary | ICD-10-CM | POA: Diagnosis not present

## 2017-01-24 ENCOUNTER — Other Ambulatory Visit: Payer: Self-pay | Admitting: Family Medicine

## 2017-02-10 ENCOUNTER — Other Ambulatory Visit: Payer: Self-pay | Admitting: Family Medicine

## 2017-02-11 NOTE — Telephone Encounter (Signed)
Last seen 12/30/16  DW[facility]  IF approved route to nurse to call into [facility]

## 2017-03-22 ENCOUNTER — Other Ambulatory Visit: Payer: Self-pay | Admitting: *Deleted

## 2017-03-22 MED ORDER — EPINEPHRINE 0.3 MG/0.3ML IJ SOAJ: 0.3000 mg | Freq: Once | INTRAMUSCULAR | 99 refills | Status: AC

## 2017-04-04 ENCOUNTER — Other Ambulatory Visit: Payer: Self-pay | Admitting: Family Medicine

## 2017-04-12 ENCOUNTER — Other Ambulatory Visit: Payer: Self-pay | Admitting: Family Medicine

## 2017-04-22 ENCOUNTER — Other Ambulatory Visit: Payer: Self-pay | Admitting: Family Medicine

## 2017-04-25 ENCOUNTER — Other Ambulatory Visit: Payer: Self-pay | Admitting: Family Medicine

## 2017-04-26 ENCOUNTER — Other Ambulatory Visit: Payer: Self-pay | Admitting: Family Medicine

## 2017-04-29 ENCOUNTER — Other Ambulatory Visit: Payer: Self-pay | Admitting: Family Medicine

## 2017-05-09 ENCOUNTER — Encounter: Payer: Self-pay | Admitting: Family Medicine

## 2017-05-09 ENCOUNTER — Ambulatory Visit (INDEPENDENT_AMBULATORY_CARE_PROVIDER_SITE_OTHER): Payer: Medicare Other | Admitting: Family Medicine

## 2017-05-09 ENCOUNTER — Ambulatory Visit (INDEPENDENT_AMBULATORY_CARE_PROVIDER_SITE_OTHER): Payer: Medicare Other

## 2017-05-09 VITALS — BP 127/62 | HR 92 | Temp 97.6°F | Ht 64.0 in | Wt 198.0 lb

## 2017-05-09 DIAGNOSIS — C61 Malignant neoplasm of prostate: Secondary | ICD-10-CM | POA: Insufficient documentation

## 2017-05-09 DIAGNOSIS — E7800 Pure hypercholesterolemia, unspecified: Secondary | ICD-10-CM | POA: Diagnosis not present

## 2017-05-09 DIAGNOSIS — E559 Vitamin D deficiency, unspecified: Secondary | ICD-10-CM | POA: Diagnosis not present

## 2017-05-09 DIAGNOSIS — Z23 Encounter for immunization: Secondary | ICD-10-CM

## 2017-05-09 DIAGNOSIS — I700 Atherosclerosis of aorta: Secondary | ICD-10-CM | POA: Diagnosis not present

## 2017-05-09 DIAGNOSIS — I10 Essential (primary) hypertension: Secondary | ICD-10-CM

## 2017-05-09 DIAGNOSIS — I493 Ventricular premature depolarization: Secondary | ICD-10-CM | POA: Diagnosis not present

## 2017-05-09 DIAGNOSIS — E119 Type 2 diabetes mellitus without complications: Secondary | ICD-10-CM | POA: Diagnosis not present

## 2017-05-09 DIAGNOSIS — D509 Iron deficiency anemia, unspecified: Secondary | ICD-10-CM | POA: Diagnosis not present

## 2017-05-09 LAB — BAYER DCA HB A1C WAIVED: HB A1C: 6.8 % (ref <7.0)

## 2017-05-09 NOTE — Patient Instructions (Addendum)
Medicare Annual Wellness Visit  [facility] and the medical providers at [facility] strive to bring you the best medical care.  In doing so we not only want to address your current medical conditions and concerns but also to detect new conditions early and prevent illness, disease and health-related problems.    Medicare offers a yearly Wellness Visit which allows our clinical staff to assess your need for preventative services including immunizations, lifestyle education, counseling to decrease risk of preventable diseases and screening for fall risk and other medical concerns.    This visit is provided free of charge (no copay) for all Medicare recipients. The clinical pharmacists at [facility] have begun to conduct these Wellness Visits which will also include a thorough review of all your medications.    As you primary medical provider recommend that you make an appointment for your Annual Wellness Visit if you have not done so already this year.  You may set up this appointment before you leave today or you may call back ([phone]) and schedule an appointment.  Please make sure when you call that you mention that you are scheduling your Annual Wellness Visit with the clinical pharmacist so that the appointment may be made for the proper length of time.     Continue current medications. Continue good therapeutic lifestyle changes which include good diet and exercise. Fall precautions discussed with patient. If an FOBT was given today- please return it to our front desk. If you are over 36 years old - you may need Prevnar 49 or the adult Pneumonia vaccine.  **Flu shots are available--- please call and schedule a FLU-CLINIC appointment**  After your visit with [place] today you will receive a survey in the mail or online from [employer] regarding your care with [place]. Please take a moment to fill this out. Your feedback is very  important to [place] as you can help [place] better understand your patient needs as well as improve your experience and satisfaction. WE CARE ABOUT YOU!!!  Continue to work aggressively with diet and exercise Continue to avoid heavy lifting pushing pulling to reduce irritation on osteoarthritis and neck and back Consider getting the new shingles shot after the next 4-6 weeks We will call with results of the chest x-ray soon as those results become available Continue to follow-up with the urologist Schedule annual wellness visit Follow up with cardiology as planned
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
## 2017-05-10 LAB — HEPATIC FUNCTION PANEL
ALK PHOS: 76 IU/L (ref 39–117)
ALT: 15 IU/L (ref 0–44)
AST: 27 IU/L (ref 0–40)
Albumin: 4.2 g/dL (ref 3.5–4.8)
Bilirubin Total: 0.9 mg/dL (ref 0.0–1.2)
Bilirubin, Direct: 0.26 mg/dL (ref 0.00–0.40)
TOTAL PROTEIN: 6.2 g/dL (ref 6.0–8.5)

## 2017-05-10 LAB — CBC WITH DIFFERENTIAL/PLATELET
BASOS ABS: 0 10*3/uL (ref 0.0–0.2)
BASOS: 0 %
EOS (ABSOLUTE): 0.1 10*3/uL (ref 0.0–0.4)
Eos: 1 %
HEMOGLOBIN: 11.9 g/dL — AB (ref 13.0–17.7)
Hematocrit: 34.4 % — ABNORMAL LOW (ref 37.5–51.0)
IMMATURE GRANS (ABS): 0 10*3/uL (ref 0.0–0.1)
Immature Granulocytes: 0 %
LYMPHS: 14 %
Lymphocytes Absolute: 1.5 10*3/uL (ref 0.7–3.1)
MCH: 33.5 pg — AB (ref 26.6–33.0)
MCHC: 34.6 g/dL (ref 31.5–35.7)
MCV: 97 fL (ref 79–97)
MONOCYTES: 9 %
Monocytes Absolute: 1 10*3/uL — ABNORMAL HIGH (ref 0.1–0.9)
NEUTROS ABS: 8 10*3/uL — AB (ref 1.4–7.0)
Neutrophils: 76 %
Platelets: 248 10*3/uL (ref 150–379)
RBC: 3.55 x10E6/uL — ABNORMAL LOW (ref 4.14–5.80)
RDW: 13.4 % (ref 12.3–15.4)
WBC: 10.7 10*3/uL (ref 3.4–10.8)

## 2017-05-10 LAB — BMP8+EGFR
BUN/Creatinine Ratio: 17 (ref 10–24)
BUN: 19 mg/dL (ref 8–27)
CALCIUM: 9.6 mg/dL (ref 8.6–10.2)
CO2: 27 mmol/L (ref 20–29)
CREATININE: 1.12 mg/dL (ref 0.76–1.27)
Chloride: 100 mmol/L (ref 96–106)
GFR calc Af Amer: 75 mL/min/{1.73_m2} (ref >59)
GFR, EST NON AFRICAN AMERICAN: 65 mL/min/{1.73_m2} (ref >59)
Glucose: 144 mg/dL — ABNORMAL HIGH (ref 65–99)
Potassium: 4.3 mmol/L (ref 3.5–5.2)
Sodium: 142 mmol/L (ref 134–144)

## 2017-05-10 LAB — LIPID PANEL
CHOLESTEROL TOTAL: 109 mg/dL (ref 100–199)
Chol/HDL Ratio: 2.7 ratio (ref 0.0–5.0)
HDL: 40 mg/dL (ref >39)
LDL CALC: 53 mg/dL (ref 0–99)
Triglycerides: 79 mg/dL (ref 0–149)
VLDL CHOLESTEROL CAL: 16 mg/dL (ref 5–40)

## 2017-05-10 LAB — VITAMIN D 25 HYDROXY (VIT D DEFICIENCY, FRACTURES): Vit D, 25-Hydroxy: 42.1 ng/mL (ref 30.0–100.0)

## 2017-05-11 DIAGNOSIS — H2513 Age-related nuclear cataract, bilateral: Secondary | ICD-10-CM | POA: Diagnosis not present

## 2017-05-11 DIAGNOSIS — E119 Type 2 diabetes mellitus without complications: Secondary | ICD-10-CM | POA: Diagnosis not present

## 2017-05-11 DIAGNOSIS — Z7984 Long term (current) use of oral hypoglycemic drugs: Secondary | ICD-10-CM | POA: Diagnosis not present

## 2017-05-11 LAB — HM DIABETES EYE EXAM

## 2017-05-16 ENCOUNTER — Ambulatory Visit (INDEPENDENT_AMBULATORY_CARE_PROVIDER_SITE_OTHER): Payer: Medicare Other | Admitting: *Deleted

## 2017-05-16 VITALS — BP 134/68 | HR 70 | Temp 97.9°F | Ht 64.0 in | Wt 198.0 lb

## 2017-05-16 DIAGNOSIS — Z0000 Encounter for general adult medical examination without abnormal findings: Secondary | ICD-10-CM

## 2017-05-16 NOTE — Progress Notes (Signed)
Subjective:   George Wang is a 71 y.o. male who presents for Medicare Annual/Subsequent preventive examination. 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 He is married and he and his wife live here local. He is states he has one son and 2 grandsons that also live local. He had a daughter who passed away in the tornado of November 07, 1996. He attends church, but not regularly. His eating habits are semi-healthy and he doesn't do much exercise aside from work. He has 2 small dogs that live in the house and he is aware of fall hazards. He states that his health is about the same as it was a year ago.       Objective:    Vitals: BP 134/68 (BP Location: Right Arm)   Pulse 70   Temp 97.9 F (36.6 C) (Oral)   Ht 5\' 4"  (1.626 m)   Wt 198 lb (89.8 kg)   BMI 33.99 kg/m   Body mass index is 33.99 kg/m.  Tobacco History  Smoking Status  . Former Smoker  . Quit date: 10/13/1978  Smokeless Tobacco  . Never Used     Counseling given: Not Answered quit smoking in 1978-11-08.  Past Medical History:  Diagnosis Date  . Anemia    Iron deficiency  . Anxiety state, unspecified   . Calculus of kidney    kidney stones  - normal BMP   . Cancer [facility])    Prostate  . Depressive disorder, not elsewhere classified   . Diverticulosis of colon (without mention of hemorrhage)   . Gout, unspecified   . Hyperlipidemia   . Hypertension, essential   . Irritable bowel syndrome   . Personal history of malignant neoplasm of prostate   . Type II or unspecified type diabetes mellitus without mention of complication, not stated as uncontrolled    Past Surgical History:  Procedure Laterality Date  . HERNIA REPAIR     left side  . PROSTATE SURGERY    . TONSILLECTOMY     Family History  Problem Relation Age of Onset  . Cancer Maternal Aunt        breast  . Pancreatic cancer Paternal Aunt   . Prostate cancer Paternal Uncle   . Heart disease Maternal Grandmother 69  . Heart  disease Maternal Grandfather        fluid / CHF   . Heart disease Paternal Grandfather 22  . Heart attack Paternal Grandfather        passed at 43  . Prostate cancer Father   . Coronary artery disease Father 24       CABG  . Hypertension Mother   . Hyperlipidemia Mother   . Other Daughter        accident with tornado Nov 07, 1996  . Hypertension Son    History  Sexual Activity  . Sexual activity: Not on file    Outpatient Encounter Prescriptions as of 05/16/2017  Medication Sig  . acetaminophen (TYLENOL) 325 MG tablet Take 650 mg by mouth every 6 (six) hours as needed for mild pain.  Marland Kitchen amLODipine (NORVASC) 10 MG tablet TAKE ONE (1) TABLET EACH DAY  . atorvastatin (LIPITOR) 80 MG tablet TAKE ONE (1) TABLET EACH DAY  . benazepril (LOTENSIN) 20 MG tablet TAKE ONE (1) TABLET EACH DAY  . beta carotene 25000 UNIT capsule Take 25,000 Units by mouth daily.  . Cholecalciferol (VITAMIN D) 2000 UNITS CAPS Take 1 capsule by mouth daily.  Marland Kitchen  clonazePAM (KLONOPIN) 0.5 MG tablet TAKE ONE TABLET BY MOUTH TWICE DAILY  . FeFum-FePo-FA-B Cmp-C-Zn-Mn-Cu (SE-TAN PLUS) 162-115.2-1 MG CAPS TAKE ONE CAPSULE BY MOUTH TWICE A DAY  . Fiber POWD Take by mouth.  . fish oil-omega-3 fatty acids 1000 MG capsule Take 2 g by mouth daily.  Marland Kitchen glimepiride (AMARYL) 4 MG tablet TAKE 1/2 TABLET TWICE DAILY  . losartan-hydrochlorothiazide (HYZAAR) 100-25 MG tablet TAKE ONE (1) TABLET EACH DAY  . metFORMIN (GLUCOPHAGE) 500 MG tablet Take 2 tabs in the am and 1 tab in the pm  . Multiple Vitamins-Minerals (CENTRUM SILVER PO) Take 1 tablet by mouth daily.   Marland Kitchen omeprazole (PRILOSEC) 20 MG capsule TAKE ONE (1) CAPSULE EACH DAY  . PARoxetine (PAXIL) 40 MG tablet TAKE ONE (1) TABLET EACH DAY  . [DISCONTINUED] FeFum-FePo-FA-B Cmp-C-Zn-Mn-Cu (SE-TAN PLUS) 162-115.2-1 MG CAPS TAKE ONE CAPSULE BY MOUTH TWICE A DAY   No facility-administered encounter medications on file as of 05/16/2017.     Activities of Daily Living In your present  state of health, do you have any difficulty performing the following activities: 05/16/2017  Hearing? N  Vision? Y  Comment wears rx glasses regularly  Difficulty concentrating or making decisions? Y  Comment at times   Walking or climbing stairs? Y  Comment back pain at times   Dressing or bathing? N  Doing errands, shopping? N  Some recent data might be hidden    Patient Care Team: Chipper Herb, MD as PCP - General (Family Medicine) Druscilla Brownie, MD (Dermatology) Sable Feil, MD (Gastroenterology) Irine Seal, MD (Urology) Minus Breeding, MD (Cardiology)   Assessment:     Exercise Activities and Dietary recommendations    Goals    . Have 3 meals a day          Eat 3 healthy meals a day  Healthy snack are ok in between    . Prevent Falls      Fall Risk Fall Risk  05/16/2017 05/09/2017 12/30/2016 08/17/2016 04/14/2016  Falls in the past year? Yes No Yes Yes Yes  Number falls in past yr: 2 or more - 1 1 1   Injury with Fall? No - No Yes No  Risk for fall due to : - - - - -  Risk for fall due to: Comment - - - - -   Depression Screen PHQ 2/9 Scores 05/16/2017 05/09/2017 12/30/2016 08/17/2016  PHQ - 2 Score 3 1 1  0  PHQ- 9 Score 5 - - -    Cognitive Function MMSE - Mini Mental State Exam 05/16/2017  Orientation to time 5  Orientation to Place 5  Registration 3  Attention/ Calculation 5  Recall 2  Language- name 2 objects 2  Language- repeat 1  Language- follow 3 step command 3  Language- read & follow direction 1  Write a sentence 1  Copy design 1  Total score 29        Immunization History  Administered Date(s) Administered  . Influenza, High Dose Seasonal PF 04/14/2016, 05/09/2017  . Influenza,inj,Quad PF,6+ Mos 05/14/2013, 05/02/2014, 04/18/2015  . Pneumococcal Conjugate-13 06/19/2013  . Pneumococcal Polysaccharide-23 01/15/2010  . Tdap 05/20/2011  . Zoster 01/17/2007   Screening Tests Health Maintenance  Topic Date Due  .  OPHTHALMOLOGY EXAM  05/23/2017 (Originally 05/14/2017)  . COLON CANCER SCREENING ANNUAL FOBT  05/23/2017 (Originally 11/20/2016)  . Hepatitis C Screening  12/07/2017 (Originally 1946/04/08)  . HEMOGLOBIN A1C  11/07/2017  . FOOT EXAM  05/09/2018  . TETANUS/TDAP  04/18/2021  . INFLUENZA VACCINE  Completed  . PNA vac Low Risk Adult  Completed      Plan:     He will Keep follow up with DR Laurance Flatten He will Take a look at the advanced directives - if decides to have them filled out - he will bring [place] a completed copy.    I have personally reviewed and noted the following in the patient's chart:   . Medical and social history . Use of alcohol, tobacco or illicit drugs  . Current medications and supplements . Functional ability and status . Nutritional status . Physical activity . Advanced directives . List of other physicians . Hospitalizations, surgeries, and ER visits in previous 12 months . Vitals . Screenings to include cognitive, depression, and falls . Referrals and appointments  In addition, I have reviewed and discussed with patient certain preventive protocols, quality metrics, and best practice recommendations. A written personalized care plan for preventive services as well as general preventive health recommendations were provided to patient.     Shamra Bradeen, Cameron Proud, LPN  39/09/90  I have reviewed and agree with the above AWV documentation.   Mary-Margaret Hassell Done, FNP

## 2017-05-16 NOTE — Patient Instructions (Signed)
  George Wang , Thank you for taking time to come for your Medicare Wellness Visit. I appreciate your ongoing commitment to your health goals. Please review the following plan we discussed and let me know if I can assist you in the future.   These are the goals we discussed: Goals    . Have 3 meals a day          Eat 3 healthy meals a day  Healthy snack are ok in between    . Prevent Falls       This is a list of the screening recommended for you and due dates:  Health Maintenance  Topic Date Due  . Eye exam for diabetics  05/23/2017*  . Stool Blood Test  05/23/2017*  .  Hepatitis C: One time screening is recommended by Center for Disease Control  (CDC) for  adults born from [birth year] through 1967.   12/07/2017*  . Hemoglobin A1C  11/07/2017  . Complete foot exam   05/09/2018  . Tetanus Vaccine  04/18/2021  . Flu Shot  Completed  . Pneumonia vaccines  Completed  *Topic was postponed. The date shown is not the original due date.    Keep follow up with DR Laurance Flatten Take a look at the advanced directives - if you decide to have them filled out - please bring [place] a completed copy.

## 2017-06-21 ENCOUNTER — Other Ambulatory Visit: Payer: Medicare Other

## 2017-06-21 DIAGNOSIS — Z1211 Encounter for screening for malignant neoplasm of colon: Secondary | ICD-10-CM

## 2017-06-26 LAB — FECAL OCCULT BLOOD, IMMUNOCHEMICAL: FECAL OCCULT BLD: NEGATIVE

## 2017-07-22 ENCOUNTER — Other Ambulatory Visit: Payer: Medicare Other

## 2017-07-22 ENCOUNTER — Other Ambulatory Visit: Payer: Self-pay | Admitting: Family Medicine

## 2017-07-22 DIAGNOSIS — Z8546 Personal history of malignant neoplasm of prostate: Secondary | ICD-10-CM | POA: Diagnosis not present

## 2017-07-27 ENCOUNTER — Other Ambulatory Visit: Payer: Self-pay | Admitting: Family Medicine

## 2017-07-29 ENCOUNTER — Ambulatory Visit (INDEPENDENT_AMBULATORY_CARE_PROVIDER_SITE_OTHER): Payer: Medicare Other | Admitting: Urology

## 2017-07-29 DIAGNOSIS — Z87442 Personal history of urinary calculi: Secondary | ICD-10-CM

## 2017-07-29 DIAGNOSIS — R351 Nocturia: Secondary | ICD-10-CM | POA: Diagnosis not present

## 2017-07-29 DIAGNOSIS — Z8546 Personal history of malignant neoplasm of prostate: Secondary | ICD-10-CM

## 2017-07-29 DIAGNOSIS — N393 Stress incontinence (female) (male): Secondary | ICD-10-CM | POA: Diagnosis not present

## 2017-08-10 ENCOUNTER — Other Ambulatory Visit: Payer: Self-pay | Admitting: Family Medicine

## 2017-08-29 ENCOUNTER — Other Ambulatory Visit: Payer: Self-pay | Admitting: Family Medicine

## 2017-09-29 ENCOUNTER — Other Ambulatory Visit: Payer: Self-pay | Admitting: Family Medicine

## 2017-09-29 NOTE — Telephone Encounter (Signed)
Next Ov 10/05/17

## 2017-10-05 ENCOUNTER — Ambulatory Visit (INDEPENDENT_AMBULATORY_CARE_PROVIDER_SITE_OTHER): Payer: Medicare Other | Admitting: Family Medicine

## 2017-10-05 ENCOUNTER — Encounter: Payer: Self-pay | Admitting: Family Medicine

## 2017-10-05 VITALS — BP 120/70 | HR 80 | Temp 98.2°F | Ht 64.0 in | Wt 200.0 lb

## 2017-10-05 DIAGNOSIS — M5440 Lumbago with sciatica, unspecified side: Secondary | ICD-10-CM

## 2017-10-05 DIAGNOSIS — I700 Atherosclerosis of aorta: Secondary | ICD-10-CM

## 2017-10-05 DIAGNOSIS — H6123 Impacted cerumen, bilateral: Secondary | ICD-10-CM | POA: Diagnosis not present

## 2017-10-05 DIAGNOSIS — F418 Other specified anxiety disorders: Secondary | ICD-10-CM

## 2017-10-05 DIAGNOSIS — I10 Essential (primary) hypertension: Secondary | ICD-10-CM | POA: Diagnosis not present

## 2017-10-05 DIAGNOSIS — E559 Vitamin D deficiency, unspecified: Secondary | ICD-10-CM

## 2017-10-05 DIAGNOSIS — R5383 Other fatigue: Secondary | ICD-10-CM | POA: Diagnosis not present

## 2017-10-05 DIAGNOSIS — E7800 Pure hypercholesterolemia, unspecified: Secondary | ICD-10-CM

## 2017-10-05 DIAGNOSIS — E119 Type 2 diabetes mellitus without complications: Secondary | ICD-10-CM

## 2017-10-05 DIAGNOSIS — Z1211 Encounter for screening for malignant neoplasm of colon: Secondary | ICD-10-CM

## 2017-10-05 DIAGNOSIS — C61 Malignant neoplasm of prostate: Secondary | ICD-10-CM | POA: Diagnosis not present

## 2017-10-05 DIAGNOSIS — M542 Cervicalgia: Secondary | ICD-10-CM

## 2017-10-05 LAB — BAYER DCA HB A1C WAIVED: HB A1C (BAYER DCA - WAIVED): 7 % — ABNORMAL HIGH (ref <7.0)

## 2017-10-05 MED ORDER — BLOOD GLUCOSE METER KIT: PACK | 1 refills | Status: AC

## 2017-10-05 NOTE — Patient Instructions (Signed)
Medicare Annual Wellness Visit  [place] and the medical providers at [facility] strive to bring you the best medical care.  In doing so we not only want to address your current medical conditions and concerns but also to detect new conditions early and prevent illness, disease and health-related problems.    Medicare offers a yearly Wellness Visit which allows our clinical staff to assess your need for preventative services including immunizations, lifestyle education, counseling to decrease risk of preventable diseases and screening for fall risk and other medical concerns.    This visit is provided free of charge (no copay) for all Medicare recipients. The clinical pharmacists at [facility] have begun to conduct these Wellness Visits which will also include a thorough review of all your medications.    As you primary medical provider recommend that you make an appointment for your Annual Wellness Visit if you have not done so already this year.  You may set up this appointment before you leave today or you may call back ([phone]) and schedule an appointment.  Please make sure when you call that you mention that you are scheduling your Annual Wellness Visit with the clinical pharmacist so that the appointment may be made for the proper length of time.     Continue current medications. Continue good therapeutic lifestyle changes which include good diet and exercise. Fall precautions discussed with patient. If an FOBT was given today- please return it to our front desk. If you are over 50 years old - you may need Prevnar 13 or the adult Pneumonia vaccine.  **Flu shots are available--- please call and schedule a FLU-CLINIC appointment**  After your visit with us today you will receive a survey in the mail or online from [employer] regarding your care with us. Please take a moment to fill this out. Your feedback is very  important to us as you can help us better understand your patient needs as well as improve your experience and satisfaction. WE CARE ABOUT YOU!!!    

## 2017-10-05 NOTE — Progress Notes (Signed)
Subjective:    Patient ID: George Wang, male    DOB: 1946/02/16, 72 y.o.   MRN: [id number]  HPI Pt here for follow up and management of chronic medical problems which includes hyperlipidemia and hypertension. He is taking medication regularly.  The patient is doing well overall but continues to have neck pain and low back pain with hurting in his legs.  He has had some recent falls.  He describes no energy.  He is diabetic and his weight is 200 pounds today with normal vital signs and good blood pressure reading.  It is significant to note that he lost a grandson due to cancer recently.  The patient has a long history of osteoarthritis that is severe in the neck and back.  He continues to be followed regularly by the urologist for his prostate cancer.  The patient is complaining today with progressive weakness in his arms and lower extremities.  He has pain with movement and is much more limited with his movement than he has been in the past.  He denies any chest pain or shortness of breath.  He denies any trouble with swallowing heartburn indigestion nausea vomiting diarrhea blood in the stool or black tarry bowel movements.  He is due to have a colonoscopy this fall and his previous gastroenterologist has retired.  He sees Dr. Jeffie Pollock regularly once a year and just saw him recently and everything is stable with his prostate.  He does not check his blood sugars at home because his machine has not been accurate and he needs a new monitor for his blood sugars.  He has seen the neurosurgeon in the past and we will do a referral back to the neurosurgeon to evaluate the aggressive arm and lower extremity weakness with a history of spinal stenosis.  The patient is stable following the loss of his grandson to cancer.     Patient Active Problem List   Diagnosis Date Noted  . Thoracic aortic atherosclerosis ([place]) 05/09/2017  . Prostate cancer ([place]) 05/09/2017  . Morbid obesity ([place]) 11/21/2015  .  Hypertension 02/28/2013  . Hyperlipemia 02/28/2013  . Type 2 diabetes mellitus ([place]) 02/28/2013  . Vitamin D deficiency 02/28/2013  . Overweight(278.02) 10/13/2011  . Frequent PVCs 10/13/2011  . HEMOCCULT POSITIVE STOOL 08/14/2008  . Iron deficiency anemia 06/27/2008  . GOUT 04/25/2008  . ANXIETY 04/25/2008  . Depression 04/25/2008  . DIVERTICULOSIS, COLON 04/25/2008  . IRRITABLE BOWEL SYNDROME 04/25/2008  . NEPHROLITHIASIS 04/25/2008  . PROSTATE CANCER, HX OF 04/25/2008   Outpatient Encounter Medications as of 10/05/2017  Medication Sig  . acetaminophen (TYLENOL) 325 MG tablet Take 650 mg by mouth every 6 (six) hours as needed for mild pain.  Marland Kitchen amLODipine (NORVASC) 10 MG tablet TAKE ONE (1) TABLET EACH DAY  . atorvastatin (LIPITOR) 80 MG tablet TAKE ONE (1) TABLET EACH DAY  . benazepril (LOTENSIN) 20 MG tablet TAKE ONE (1) TABLET EACH DAY  . beta carotene 25000 UNIT capsule Take 25,000 Units by mouth daily.  . Cholecalciferol (VITAMIN D) 2000 UNITS CAPS Take 1 capsule by mouth daily.  . clonazePAM (KLONOPIN) 0.5 MG tablet TAKE ONE TABLET BY MOUTH TWICE DAILY  . FeFum-FePo-FA-B Cmp-C-Zn-Mn-Cu (SE-TAN PLUS) 162-115.2-1 MG CAPS TAKE ONE CAPSULE BY MOUTH TWICE A DAY  . Fiber POWD Take by mouth.  . fish oil-omega-3 fatty acids 1000 MG capsule Take 2 g by mouth daily.  Marland Kitchen glimepiride (AMARYL) 4 MG tablet TAKE 1/2 TABLET TWICE DAILY  . losartan-hydrochlorothiazide (HYZAAR)  100-25 MG tablet TAKE ONE (1) TABLET EACH DAY  . metFORMIN (GLUCOPHAGE) 500 MG tablet Take 2 tabs in the am and 1 tab in the pm  . Multiple Vitamins-Minerals (CENTRUM SILVER PO) Take 1 tablet by mouth daily.   Marland Kitchen omeprazole (PRILOSEC) 20 MG capsule TAKE ONE (1) CAPSULE EACH DAY  . PARoxetine (PAXIL) 40 MG tablet TAKE ONE (1) TABLET EACH DAY  . [DISCONTINUED] febuxostat (ULORIC) 40 MG tablet Take 80 mg by mouth daily.  . [DISCONTINUED] FeFum-FePo-FA-B Cmp-C-Zn-Mn-Cu (SE-TAN PLUS) 162-115.2-1 MG CAPS TAKE ONE CAPSULE BY  MOUTH TWICE A DAY  . [DISCONTINUED] glimepiride (AMARYL) 4 MG tablet TAKE 1/2 TABLET TWICE DAILY  . [DISCONTINUED] simvastatin (ZOCOR) 80 MG tablet Take 80 mg by mouth at bedtime.   No facility-administered encounter medications on file as of 10/05/2017.      Review of Systems  Constitutional: Negative.   HENT: Negative.   Eyes: Negative.   Respiratory: Negative.   Cardiovascular: Negative.   Gastrointestinal: Negative.   Endocrine: Negative.   Genitourinary: Negative.   Musculoskeletal: Positive for back pain (legs and low back  // falls ) and neck pain.  Skin: Negative.   Allergic/Immunologic: Negative.   Neurological: Negative.   Hematological: Negative.   Psychiatric/Behavioral: Negative.        Objective:   Physical Exam  Constitutional: He is oriented to person, place, and time. He appears well-developed and well-nourished. No distress.  The patient is pleasant and alert and still grieving from the loss of his grandson  HENT:  Head: Normocephalic and atraumatic.  Right Ear: External ear normal.  Left Ear: External ear normal.  Mouth/Throat: Oropharynx is clear and moist. No oropharyngeal exudate.  Nasal congestion left greater than right  Eyes: Conjunctivae and EOM are normal. Pupils are equal, round, and reactive to light. Right eye exhibits no discharge. Left eye exhibits no discharge. No scleral icterus.  Neck: Normal range of motion. Neck supple. No thyromegaly present.  No bruits thyromegaly or anterior cervical adenopathy  Cardiovascular: Normal rate, normal heart sounds and intact distal pulses.  No murmur heard. The heart was very slightly irregular at 72/min  Pulmonary/Chest: Effort normal and breath sounds normal. No respiratory distress. He has no wheezes. He has no rales. He exhibits no tenderness.  No axillary adenopathy no chest wall masses and lungs were clear anteriorly and posteriorly  Abdominal: Soft. Bowel sounds are normal. He exhibits no mass.  There is no tenderness. There is no rebound and no guarding.  Obesity without liver or spleen enlargement epigastric tenderness inguinal adenopathy masses or bruits  Genitourinary:  Genitourinary Comments: The patient continues to follow-up with a urologist regularly and yearly.  He is up-to-date with his visits with him.  Musculoskeletal: Normal range of motion. He exhibits no edema.  Fairly good range of motion without reproducing pain.  Lymphadenopathy:    He has no cervical adenopathy.  Neurological: He is alert and oriented to person, place, and time. He has normal reflexes. No cranial nerve deficit.  Upper extremity reflexes are diminished bilaterally lower extremity reflexes are equal bilaterally  Skin: Skin is warm and dry. No rash noted.  Psychiatric: He has a normal mood and affect. His behavior is normal. Judgment and thought content normal.  Nursing note and vitals reviewed.  BP 120/70 (BP Location: Left Arm)   Pulse 80   Temp 98.2 F (36.8 C) (Oral)   Ht 5' 4"  (1.626 m)   Wt 200 lb (90.7 kg)   BMI 34.33  kg/m   Irrigation of both ears to remove cerumen with successful removal of cerumen.      Assessment & Plan:  1. Type 2 diabetes mellitus without complication, without long-term current use of insulin ([place]) -Patient needs a new blood sugar monitor.  We will give him a prescription for one that his insurance will pay for today.  He does not check his sugars regularly because his machine is off. - CBC with Differential/Platelet - Bayer DCA Hb A1c Waived  2. Pure hypercholesterolemia -Continue with current treatment pending results of lab work - CBC with Differential/Platelet - Lipid panel  3. Essential hypertension -Blood pressure is good he will continue with current treatment - BMP8+EGFR - CBC with Differential/Platelet - Hepatic function panel  4. Prostate cancer ([place]) -Continue to follow-up with urology yearly - CBC with Differential/Platelet  5. Vitamin  D deficiency -Continue with current treatment pending results of lab work - CBC with Differential/Platelet - VITAMIN D 25 Hydroxy (Vit-D Deficiency, Fractures)  6. Other fatigue -Continue for now with Xanax and antidepressant - CBC with Differential/Platelet - Thyroid Panel With TSH  7. Morbid obesity ([place]) -Continue to work aggressively on weight loss through diet and exercise  8. Thoracic aortic atherosclerosis ([facility]) -Continue current statin  9. Anxiety with depression -Continue current treatment  10. Neck pain, bilateral -Referral to neurosurgery, may have to get MRI if neurosurgery will not do this.  11. Low back pain with sciatica, sciatica laterality unspecified, unspecified back pain laterality, unspecified chronicity -Referral to neurosurgery for further evaluation of weakness and pain in lower extremities.  May have to get MRI if neurosurgery will not do this.  12. Excessive cerumen in both ear canals -Ear irrigation to remove cerumen accomplished without problems.  Patient Instructions                       Medicare Annual Wellness Visit  [facility] and the medical providers at [facility] strive to bring you the best medical care.  In doing so we not only want to address your current medical conditions and concerns but also to detect new conditions early and prevent illness, disease and health-related problems.    Medicare offers a yearly Wellness Visit which allows our clinical staff to assess your need for preventative services including immunizations, lifestyle education, counseling to decrease risk of preventable diseases and screening for fall risk and other medical concerns.    This visit is provided free of charge (no copay) for all Medicare recipients. The clinical pharmacists at [facility] have begun to conduct these Wellness Visits which will also include a thorough review of all your medications.    As you  primary medical provider recommend that you make an appointment for your Annual Wellness Visit if you have not done so already this year.  You may set up this appointment before you leave today or you may call back ([phone]) and schedule an appointment.  Please make sure when you call that you mention that you are scheduling your Annual Wellness Visit with the clinical pharmacist so that the appointment may be made for the proper length of time.     Continue current medications. Continue good therapeutic lifestyle changes which include good diet and exercise. Fall precautions discussed with patient. If an FOBT was given today- please return it to our front desk. If you are over 40 years old - you may need Prevnar 25 or the adult Pneumonia vaccine.  **  Flu shots are available--- please call and schedule a FLU-CLINIC appointment**  After your visit with [place] today you will receive a survey in the mail or online from [employer] regarding your care with [place]. Please take a moment to fill this out. Your feedback is very important to [place] as you can help [place] better understand your patient needs as well as improve your experience and satisfaction. WE CARE ABOUT YOU!!!     Arrie Senate MD

## 2017-10-05 NOTE — Addendum Note (Signed)
Addended by: Zannie Cove on: 10/05/2017 12:45 PM   Modules accepted: Orders

## 2017-10-05 NOTE — Addendum Note (Signed)
Addended by: Zannie Cove on: 10/05/2017 10:58 AM   Modules accepted: Orders

## 2017-10-05 NOTE — Addendum Note (Signed)
Addended by: Zannie Cove on: 10/05/2017 11:59 AM   Modules accepted: Orders

## 2017-10-06 LAB — CBC WITH DIFFERENTIAL/PLATELET
BASOS: 1 %
Basophils Absolute: 0.1 10*3/uL (ref 0.0–0.2)
EOS (ABSOLUTE): 0.2 10*3/uL (ref 0.0–0.4)
EOS: 3 %
HEMATOCRIT: 35.4 % — AB (ref 37.5–51.0)
HEMOGLOBIN: 12 g/dL — AB (ref 13.0–17.7)
IMMATURE GRANS (ABS): 0 10*3/uL (ref 0.0–0.1)
IMMATURE GRANULOCYTES: 0 %
LYMPHS: 20 %
Lymphocytes Absolute: 1.3 10*3/uL (ref 0.7–3.1)
MCH: 32.1 pg (ref 26.6–33.0)
MCHC: 33.9 g/dL (ref 31.5–35.7)
MCV: 95 fL (ref 79–97)
Monocytes Absolute: 0.6 10*3/uL (ref 0.1–0.9)
Monocytes: 9 %
NEUTROS PCT: 67 %
Neutrophils Absolute: 4.4 10*3/uL (ref 1.4–7.0)
Platelets: 255 10*3/uL (ref 150–379)
RBC: 3.74 x10E6/uL — ABNORMAL LOW (ref 4.14–5.80)
RDW: 13.3 % (ref 12.3–15.4)
WBC: 6.5 10*3/uL (ref 3.4–10.8)

## 2017-10-06 LAB — LIPID PANEL
CHOL/HDL RATIO: 3 ratio (ref 0.0–5.0)
Cholesterol, Total: 116 mg/dL (ref 100–199)
HDL: 39 mg/dL — AB (ref >39)
LDL Calculated: 62 mg/dL (ref 0–99)
Triglycerides: 73 mg/dL (ref 0–149)
VLDL CHOLESTEROL CAL: 15 mg/dL (ref 5–40)

## 2017-10-06 LAB — HEPATIC FUNCTION PANEL
ALT: 21 IU/L (ref 0–44)
AST: 34 IU/L (ref 0–40)
Albumin: 4.4 g/dL (ref 3.5–4.8)
Alkaline Phosphatase: 77 IU/L (ref 39–117)
BILIRUBIN, DIRECT: 0.19 mg/dL (ref 0.00–0.40)
Bilirubin Total: 0.6 mg/dL (ref 0.0–1.2)
Total Protein: 6.5 g/dL (ref 6.0–8.5)

## 2017-10-06 LAB — BMP8+EGFR
BUN/Creatinine Ratio: 17 (ref 10–24)
BUN: 17 mg/dL (ref 8–27)
CO2: 23 mmol/L (ref 20–29)
Calcium: 9.9 mg/dL (ref 8.6–10.2)
Chloride: 101 mmol/L (ref 96–106)
Creatinine, Ser: 1.01 mg/dL (ref 0.76–1.27)
GFR calc Af Amer: 85 mL/min/{1.73_m2} (ref >59)
GFR, EST NON AFRICAN AMERICAN: 73 mL/min/{1.73_m2} (ref >59)
GLUCOSE: 164 mg/dL — AB (ref 65–99)
POTASSIUM: 4.7 mmol/L (ref 3.5–5.2)
SODIUM: 142 mmol/L (ref 134–144)

## 2017-10-06 LAB — SPECIMEN STATUS REPORT

## 2017-10-06 LAB — THYROID PANEL WITH TSH
Free Thyroxine Index: 1.8 (ref 1.2–4.9)
T3 UPTAKE RATIO: 24 % (ref 24–39)
T4 TOTAL: 7.7 ug/dL (ref 4.5–12.0)
TSH: 0.675 u[IU]/mL (ref 0.450–4.500)

## 2017-10-06 LAB — VITAMIN D 25 HYDROXY (VIT D DEFICIENCY, FRACTURES): VIT D 25 HYDROXY: 47.6 ng/mL (ref 30.0–100.0)

## 2017-10-14 ENCOUNTER — Other Ambulatory Visit: Payer: Self-pay | Admitting: Family Medicine

## 2017-10-20 ENCOUNTER — Other Ambulatory Visit: Payer: Self-pay | Admitting: Family Medicine

## 2017-10-21 ENCOUNTER — Telehealth: Payer: Self-pay | Admitting: Family Medicine

## 2017-10-21 MED ORDER — TRAMADOL HCL 50 MG PO TABS: 50.0000 mg | ORAL_TABLET | Freq: Two times a day (BID) | ORAL | 0 refills | Status: DC

## 2017-10-21 NOTE — Telephone Encounter (Signed)
Please call a prescription in for Ultram 50 mg 1 twice daily if needed for severe pain #18.  Continue to take extra strength Tylenol otherwise.

## 2017-10-21 NOTE — Telephone Encounter (Signed)
Pt aware of meds - DWM to sign

## 2017-10-26 ENCOUNTER — Other Ambulatory Visit: Payer: Self-pay | Admitting: Family Medicine

## 2017-10-28 ENCOUNTER — Telehealth: Payer: Self-pay | Admitting: Family Medicine

## 2017-10-28 ENCOUNTER — Other Ambulatory Visit: Payer: Self-pay | Admitting: Family Medicine

## 2017-10-28 MED ORDER — TRAMADOL HCL 50 MG PO TABS: 50.0000 mg | ORAL_TABLET | Freq: Two times a day (BID) | ORAL | 0 refills | Status: DC

## 2017-10-28 NOTE — Telephone Encounter (Signed)
Done

## 2017-10-28 NOTE — Telephone Encounter (Signed)
Sign the rx and it will go to the pharm

## 2017-10-28 NOTE — Telephone Encounter (Signed)
4/18 - ortho appt = he is going to run out of pain med Can he have a RF sent to drug store [facility].

## 2017-10-28 NOTE — Telephone Encounter (Signed)
Pt aware.

## 2017-10-28 NOTE — Telephone Encounter (Signed)
Please refill pain medicine and soon to [facility]

## 2017-10-31 ENCOUNTER — Other Ambulatory Visit: Payer: Self-pay | Admitting: Family Medicine

## 2017-11-03 DIAGNOSIS — Z6834 Body mass index (BMI) 34.0-34.9, adult: Secondary | ICD-10-CM | POA: Diagnosis not present

## 2017-11-03 DIAGNOSIS — I10 Essential (primary) hypertension: Secondary | ICD-10-CM | POA: Diagnosis not present

## 2017-11-03 DIAGNOSIS — M4310 Spondylolisthesis, site unspecified: Secondary | ICD-10-CM | POA: Diagnosis not present

## 2017-11-08 DIAGNOSIS — M4310 Spondylolisthesis, site unspecified: Secondary | ICD-10-CM | POA: Diagnosis not present

## 2017-11-08 DIAGNOSIS — M545 Low back pain: Secondary | ICD-10-CM | POA: Diagnosis not present

## 2017-11-09 DIAGNOSIS — Z6835 Body mass index (BMI) 35.0-35.9, adult: Secondary | ICD-10-CM | POA: Diagnosis not present

## 2017-11-09 DIAGNOSIS — M48062 Spinal stenosis, lumbar region with neurogenic claudication: Secondary | ICD-10-CM | POA: Diagnosis not present

## 2017-11-09 DIAGNOSIS — M4310 Spondylolisthesis, site unspecified: Secondary | ICD-10-CM | POA: Diagnosis not present

## 2017-11-09 DIAGNOSIS — I10 Essential (primary) hypertension: Secondary | ICD-10-CM | POA: Diagnosis not present

## 2017-11-19 ENCOUNTER — Other Ambulatory Visit: Payer: Self-pay | Admitting: Family Medicine

## 2017-11-22 DIAGNOSIS — M47816 Spondylosis without myelopathy or radiculopathy, lumbar region: Secondary | ICD-10-CM | POA: Diagnosis not present

## 2017-11-22 DIAGNOSIS — M48062 Spinal stenosis, lumbar region with neurogenic claudication: Secondary | ICD-10-CM | POA: Diagnosis not present

## 2017-11-22 DIAGNOSIS — E119 Type 2 diabetes mellitus without complications: Secondary | ICD-10-CM | POA: Diagnosis not present

## 2017-11-22 DIAGNOSIS — Z6834 Body mass index (BMI) 34.0-34.9, adult: Secondary | ICD-10-CM | POA: Diagnosis not present

## 2017-11-22 DIAGNOSIS — M5416 Radiculopathy, lumbar region: Secondary | ICD-10-CM | POA: Diagnosis not present

## 2017-11-23 DIAGNOSIS — M48062 Spinal stenosis, lumbar region with neurogenic claudication: Secondary | ICD-10-CM | POA: Diagnosis not present

## 2017-11-23 DIAGNOSIS — Z6834 Body mass index (BMI) 34.0-34.9, adult: Secondary | ICD-10-CM | POA: Diagnosis not present

## 2017-11-23 DIAGNOSIS — I10 Essential (primary) hypertension: Secondary | ICD-10-CM | POA: Diagnosis not present

## 2017-12-14 DIAGNOSIS — M5416 Radiculopathy, lumbar region: Secondary | ICD-10-CM | POA: Diagnosis not present

## 2017-12-14 DIAGNOSIS — M48062 Spinal stenosis, lumbar region with neurogenic claudication: Secondary | ICD-10-CM | POA: Diagnosis not present

## 2017-12-16 ENCOUNTER — Other Ambulatory Visit: Payer: Self-pay | Admitting: Family Medicine

## 2018-01-12 DIAGNOSIS — L814 Other melanin hyperpigmentation: Secondary | ICD-10-CM | POA: Diagnosis not present

## 2018-01-12 DIAGNOSIS — D229 Melanocytic nevi, unspecified: Secondary | ICD-10-CM | POA: Diagnosis not present

## 2018-01-12 DIAGNOSIS — D1801 Hemangioma of skin and subcutaneous tissue: Secondary | ICD-10-CM | POA: Diagnosis not present

## 2018-01-12 DIAGNOSIS — L821 Other seborrheic keratosis: Secondary | ICD-10-CM | POA: Diagnosis not present

## 2018-01-12 DIAGNOSIS — L819 Disorder of pigmentation, unspecified: Secondary | ICD-10-CM | POA: Diagnosis not present

## 2018-01-18 ENCOUNTER — Other Ambulatory Visit: Payer: Self-pay | Admitting: Family Medicine

## 2018-02-08 DIAGNOSIS — M5416 Radiculopathy, lumbar region: Secondary | ICD-10-CM | POA: Diagnosis not present

## 2018-02-08 DIAGNOSIS — Z6833 Body mass index (BMI) 33.0-33.9, adult: Secondary | ICD-10-CM | POA: Diagnosis not present

## 2018-02-08 DIAGNOSIS — I10 Essential (primary) hypertension: Secondary | ICD-10-CM | POA: Diagnosis not present

## 2018-02-08 DIAGNOSIS — M48062 Spinal stenosis, lumbar region with neurogenic claudication: Secondary | ICD-10-CM | POA: Diagnosis not present

## 2018-02-14 ENCOUNTER — Ambulatory Visit (INDEPENDENT_AMBULATORY_CARE_PROVIDER_SITE_OTHER): Payer: Medicare Other | Admitting: Family Medicine

## 2018-02-14 ENCOUNTER — Telehealth: Payer: Self-pay | Admitting: *Deleted

## 2018-02-14 ENCOUNTER — Encounter: Payer: Self-pay | Admitting: Family Medicine

## 2018-02-14 VITALS — BP 116/63 | HR 75 | Temp 98.0°F | Ht 64.0 in | Wt 198.0 lb

## 2018-02-14 DIAGNOSIS — E7800 Pure hypercholesterolemia, unspecified: Secondary | ICD-10-CM

## 2018-02-14 DIAGNOSIS — E559 Vitamin D deficiency, unspecified: Secondary | ICD-10-CM

## 2018-02-14 DIAGNOSIS — R638 Other symptoms and signs concerning food and fluid intake: Secondary | ICD-10-CM | POA: Diagnosis not present

## 2018-02-14 DIAGNOSIS — C61 Malignant neoplasm of prostate: Secondary | ICD-10-CM

## 2018-02-14 DIAGNOSIS — M5440 Lumbago with sciatica, unspecified side: Secondary | ICD-10-CM

## 2018-02-14 DIAGNOSIS — I700 Atherosclerosis of aorta: Secondary | ICD-10-CM

## 2018-02-14 DIAGNOSIS — I499 Cardiac arrhythmia, unspecified: Secondary | ICD-10-CM | POA: Diagnosis not present

## 2018-02-14 DIAGNOSIS — E119 Type 2 diabetes mellitus without complications: Secondary | ICD-10-CM | POA: Diagnosis not present

## 2018-02-14 DIAGNOSIS — M542 Cervicalgia: Secondary | ICD-10-CM | POA: Diagnosis not present

## 2018-02-14 DIAGNOSIS — F418 Other specified anxiety disorders: Secondary | ICD-10-CM | POA: Diagnosis not present

## 2018-02-14 DIAGNOSIS — I10 Essential (primary) hypertension: Secondary | ICD-10-CM

## 2018-02-14 LAB — BAYER DCA HB A1C WAIVED: HB A1C (BAYER DCA - WAIVED): 6.9 % (ref <7.0)

## 2018-02-14 MED ORDER — CLONAZEPAM 0.5 MG PO TABS: 0.2500 mg | ORAL_TABLET | Freq: Three times a day (TID) | ORAL | 3 refills | Status: DC | PRN

## 2018-02-14 NOTE — Patient Instructions (Addendum)
Medicare Annual Wellness Visit  [facility] and the medical providers at [facility] strive to bring you the best medical care.  In doing so we not only want to address your current medical conditions and concerns but also to detect new conditions early and prevent illness, disease and health-related problems.    Medicare offers a yearly Wellness Visit which allows our clinical staff to assess your need for preventative services including immunizations, lifestyle education, counseling to decrease risk of preventable diseases and screening for fall risk and other medical concerns.    This visit is provided free of charge (no copay) for all Medicare recipients. The clinical pharmacists at [facility] have begun to conduct these Wellness Visits which will also include a thorough review of all your medications.    As you primary medical provider recommend that you make an appointment for your Annual Wellness Visit if you have not done so already this year.  You may set up this appointment before you leave today or you may call back ([phone]) and schedule an appointment.  Please make sure when you call that you mention that you are scheduling your Annual Wellness Visit with the clinical pharmacist so that the appointment may be made for the proper length of time.     Continue current medications. Continue good therapeutic lifestyle changes which include good diet and exercise. Fall precautions discussed with patient. If an FOBT was given today- please return it to our front desk. If you are over 74 years old - you may need Prevnar 35 or the adult Pneumonia vaccine.  **Flu shots are available--- please call and schedule a FLU-CLINIC appointment**  After your visit with [place] today you will receive a survey in the mail or online from [employer] regarding your care with [place]. Please take a moment to fill this out. Your feedback is very  important to [place] as you can help [place] better understand your patient needs as well as improve your experience and satisfaction. WE CARE ABOUT YOU!!!   Continue to follow up with the urologist as planned You should get an appointment with the gastroenterologist and get your colonoscopy this fall.  We will help you arrange this visit. Continue to follow-up with neurology and neurosurgery. Continue to drink plenty of water and stay as active as possible. Try reducing the current clonazepam 0.5 to 1/2 pill 2-3 times daily and by the next visit try to get to 1/2 pill twice daily. Continue with current diabetic medicines pending results of lab work Continue to follow-up with neurology and neurosurgery as noted with any shots as they deem necessary.

## 2018-02-14 NOTE — Telephone Encounter (Signed)
Error

## 2018-02-14 NOTE — Progress Notes (Signed)
Subjective:    Patient ID: George Wang, male    DOB: 02-08-1946, 72 y.o.   MRN: [id number]  HPI Pt here for follow up and management of chronic medical problems which includes diabetes, hyperlipidemia and hypertension. He is taking medication regularly.  Depression screen scoring was a 10.  He has a history of having lost a daughter and a tornado back in 54 or thereabouts.  He also lost a grandson because of cancer recently.  He is currently taking clonazepam and would like to try to reduce this if possible.  His weight is down 2 pounds from the previous visit and his body mass index is now 34.33.  The patient has been taking clonazepam 3 times daily and we discussed his desire to try to reduce this and we will reduce it to a half of 1 3 times daily and possibly twice daily after a month.  He will continue with his Paxil as he thinks that he needs this for his depression.  He is always been depressed.  He is been through a lot personally with his family the deaths of his daughter his mother father and recent loss of his grandson and now his son going through a divorce.  The patient is doing better with his back pain as he has been getting shots in his back with the help of Dr. Trenton Gammon and Dr. Brien Few.  He has had 3 series of shots and he is not in the pain that he was in previously.  He denies any chest pain.  He does worry about his heart because of his increased risk factors and after discussing this he would like to have an appointment again with a cardiologist for a further evaluation.  He does have shortness of breath at times with exertion but once again no chest pain or pressure.  He denies any trouble with his stomach including heartburn indigestion nausea vomiting diarrhea or blood in the stool.  He is due to get a colonoscopy this year and specifically this fall.  We will make sure that this gets set up for him.  He sees the urologist regularly and of course has had prostate cancer.  He  denies any specific symptoms.    Patient Active Problem List   Diagnosis Date Noted  . Thoracic aortic atherosclerosis ([place]) 05/09/2017  . Prostate cancer ([place]) 05/09/2017  . Morbid obesity ([place]) 11/21/2015  . Hypertension 02/28/2013  . Hyperlipemia 02/28/2013  . Type 2 diabetes mellitus ([place]) 02/28/2013  . Vitamin D deficiency 02/28/2013  . Overweight(278.02) 10/13/2011  . Frequent PVCs 10/13/2011  . HEMOCCULT POSITIVE STOOL 08/14/2008  . Iron deficiency anemia 06/27/2008  . GOUT 04/25/2008  . ANXIETY 04/25/2008  . Depression 04/25/2008  . DIVERTICULOSIS, COLON 04/25/2008  . IRRITABLE BOWEL SYNDROME 04/25/2008  . NEPHROLITHIASIS 04/25/2008  . PROSTATE CANCER, HX OF 04/25/2008   Outpatient Encounter Medications as of 02/14/2018  Medication Sig  . acetaminophen (TYLENOL) 325 MG tablet Take 650 mg by mouth every 6 (six) hours as needed for mild pain.  Marland Kitchen amLODipine (NORVASC) 10 MG tablet TAKE ONE (1) TABLET EACH DAY  . atorvastatin (LIPITOR) 80 MG tablet TAKE ONE (1) TABLET EACH DAY  . benazepril (LOTENSIN) 20 MG tablet TAKE ONE (1) TABLET EACH DAY  . beta carotene 25000 UNIT capsule Take 25,000 Units by mouth daily.  . blood glucose meter kit and supplies Dispense based on patient and insurance preference. Use up to four times daily as directed. (FOR ICD-10 E10.9,  E11.9).  Marland Kitchen Cholecalciferol (VITAMIN D) 2000 UNITS CAPS Take 2 capsules by mouth daily.   . clonazePAM (KLONOPIN) 0.5 MG tablet TAKE ONE TABLET BY MOUTH TWICE DAILY  . FeFum-FePo-FA-B Cmp-C-Zn-Mn-Cu (SE-TAN PLUS) 162-115.2-1 MG CAPS TAKE ONE CAPSULE BY MOUTH TWICE A DAY  . Fiber POWD Take by mouth.  . fish oil-omega-3 fatty acids 1000 MG capsule Take 2 g by mouth daily.  Marland Kitchen glimepiride (AMARYL) 4 MG tablet TAKE 1/2 TABLET TWICE DAILY  . losartan-hydrochlorothiazide (HYZAAR) 100-25 MG tablet TAKE ONE (1) TABLET EACH DAY  . metFORMIN (GLUCOPHAGE) 500 MG tablet TAKE 2 TABLETS EACH MORNING AND 1 TABLETEACH EVENING  .  Multiple Vitamins-Minerals (CENTRUM SILVER PO) Take 1 tablet by mouth daily.   Marland Kitchen omeprazole (PRILOSEC) 20 MG capsule TAKE ONE (1) CAPSULE EACH DAY  . PARoxetine (PAXIL) 40 MG tablet TAKE ONE (1) TABLET EACH DAY  . [DISCONTINUED] traMADol (ULTRAM) 50 MG tablet Take 1 tablet (50 mg total) by mouth 2 (two) times daily.  . [DISCONTINUED] febuxostat (ULORIC) 40 MG tablet Take 80 mg by mouth daily.  . [DISCONTINUED] glimepiride (AMARYL) 4 MG tablet TAKE 1/2 TABLET TWICE DAILY  . [DISCONTINUED] simvastatin (ZOCOR) 80 MG tablet Take 80 mg by mouth at bedtime.   No facility-administered encounter medications on file as of 02/14/2018.       Review of Systems  Constitutional: Negative.   HENT: Negative.   Eyes: Negative.   Respiratory: Negative.   Cardiovascular: Negative.   Gastrointestinal: Negative.   Endocrine: Negative.   Genitourinary: Negative.   Musculoskeletal: Negative.   Skin: Negative.   Allergic/Immunologic: Negative.   Neurological: Negative.   Hematological: Negative.   Psychiatric/Behavioral: Negative.        Objective:   Physical Exam  Constitutional: He is oriented to person, place, and time. He appears well-developed and well-nourished.  The patient admits that he is depressed.  He is currently taking Paxil 40 mg daily.  He has always been anxious and depressed and this runs in his family.  He remains somewhat phobic about anything to do with his health.  He admits this.  HENT:  Head: Normocephalic and atraumatic.  Right Ear: External ear normal.  Left Ear: External ear normal.  Nose: Nose normal.  Mouth/Throat: Oropharynx is clear and moist. No oropharyngeal exudate.  Eyes: Pupils are equal, round, and reactive to light. Conjunctivae and EOM are normal. Right eye exhibits no discharge. Left eye exhibits no discharge. No scleral icterus.  Neck: Normal range of motion. Neck supple. No thyromegaly present.  No bruits thyromegaly or anterior cervical adenopathy    Cardiovascular: Normal rate, normal heart sounds and intact distal pulses.  No murmur heard. Heart is irregular today at 60 to 72/min  Pulmonary/Chest: Effort normal and breath sounds normal. He has no wheezes. He has no rales. He exhibits no tenderness.  Clear anteriorly and posteriorly and no axillary adenopathy  Abdominal: Soft. Bowel sounds are normal. He exhibits no mass. There is no tenderness.  Abdominal obesity without liver or spleen enlargement epigastric tenderness masses or inguinal adenopathy.  No bruits heard.  Genitourinary:  Genitourinary Comments: Sees urologist regularly and next visit is sometime in early 2020.  Musculoskeletal: He exhibits no edema.  Still has ongoing issues with position and sleeping with his back and his back is much improved and the pain is only going down the right hip and the right thigh currently.  Shots he has been receiving are helping.  Lymphadenopathy:    He has no  cervical adenopathy.  Neurological: He is alert and oriented to person, place, and time. He has normal reflexes. No cranial nerve deficit.  Reflexes are 2+ and equal bilaterally  Skin: Skin is warm and dry. No rash noted.  Psychiatric: He has a normal mood and affect. His behavior is normal. Judgment and thought content normal.  Nursing note and vitals reviewed.  BP 116/63 (BP Location: Left Arm)   Pulse 75   Temp 98 F (36.7 C) (Oral)   Ht 5' 4" (1.626 m)   Wt 198 lb (89.8 kg)   BMI 33.99 kg/m         Assessment & Plan:  1. Type 2 diabetes mellitus without complication, without long-term current use of insulin ([place]) -Continue current treatment and therapeutic lifestyle changes pending results of lab work - CBC with Differential/Platelet - Bayer Davis Hb A1c Waived - Ambulatory referral to Cardiology  2. Pure hypercholesterolemia -Continue current treatment pending results of lab work - CBC with Differential/Platelet - Lipid panel - Ambulatory referral to  Cardiology - EKG 12-Lead  3. Essential hypertension -Blood pressure is good today and he will continue with current treatment - BMP8+EGFR - CBC with Differential/Platelet - Hepatic function panel - Ambulatory referral to Cardiology - EKG 12-Lead  4. Vitamin D deficiency -Tinea with vitamin D replacement pending results of lab work - CBC with Differential/Platelet - VITAMIN D 25 Hydroxy (Vit-D Deficiency, Fractures)  5. Prostate cancer [facility]) -Follow-up with urology as planned the first of 2020 - CBC with Differential/Platelet  6. Thoracic aortic atherosclerosis ([place]) -Continue aggressive therapeutic lifestyle changes and current treatment for cholesterol pending results of lab work - CBC with Differential/Platelet - Lipid panel - Ambulatory referral to Cardiology  7. Neck pain, bilateral -Follow-up with neurology and neurosurgery as planned - CBC with Differential/Platelet  8. Low back pain with sciatica, sciatica laterality unspecified, unspecified back pain laterality, unspecified chronicity -Follow-up with neurology and neurosurgery as planned - CBC with Differential/Platelet  9. Anxiety with depression -Continue with Paxil 40 and at patient's request he will try to reduce the clonazepam to one half of a 0.52-3 times daily. - CBC with Differential/Platelet  10. Increased BMI -Continue to work actively at walking and drinking plenty of water - CBC with Differential/Platelet  11.  Irregular heartbeat -EKG with results pending and referral to cardiology for further evaluation per patient request.  Meds ordered this encounter  Medications  . clonazePAM (KLONOPIN) 0.5 MG tablet    Sig: Take 0.5 tablets (0.25 mg total) by mouth 3 (three) times daily as needed for anxiety.    Dispense:  90 tablet    Refill:  3   Patient Instructions                       Medicare Annual Wellness Visit  [facility] and the medical providers at [facility] strive  to bring you the best medical care.  In doing so we not only want to address your current medical conditions and concerns but also to detect new conditions early and prevent illness, disease and health-related problems.    Medicare offers a yearly Wellness Visit which allows our clinical staff to assess your need for preventative services including immunizations, lifestyle education, counseling to decrease risk of preventable diseases and screening for fall risk and other medical concerns.    This visit is provided free of charge (no copay) for all Medicare recipients. The clinical pharmacists at [facility]  have begun to conduct these Wellness Visits which will also include a thorough review of all your medications.    As you primary medical provider recommend that you make an appointment for your Annual Wellness Visit if you have not done so already this year.  You may set up this appointment before you leave today or you may call back ([phone]) and schedule an appointment.  Please make sure when you call that you mention that you are scheduling your Annual Wellness Visit with the clinical pharmacist so that the appointment may be made for the proper length of time.     Continue current medications. Continue good therapeutic lifestyle changes which include good diet and exercise. Fall precautions discussed with patient. If an FOBT was given today- please return it to our front desk. If you are over 67 years old - you may need Prevnar 94 or the adult Pneumonia vaccine.  **Flu shots are available--- please call and schedule a FLU-CLINIC appointment**  After your visit with [place] today you will receive a survey in the mail or online from [employer] regarding your care with [place]. Please take a moment to fill this out. Your feedback is very important to [place] as you can help [place] better understand your patient needs as well as improve your experience and satisfaction. WE CARE ABOUT  YOU!!!   Continue to follow up with the urologist as planned You should get an appointment with the gastroenterologist and get your colonoscopy this fall.  We will help you arrange this visit. Continue to follow-up with neurology and neurosurgery. Continue to drink plenty of water and stay as active as possible. Try reducing the current clonazepam 0.5 to 1/2 pill 2-3 times daily and by the next visit try to get to 1/2 pill twice daily. Continue with current diabetic medicines pending results of lab work Continue to follow-up with neurology and neurosurgery as noted with any shots as they deem necessary.  Arrie Senate MD

## 2018-02-15 LAB — LIPID PANEL
CHOL/HDL RATIO: 2.8 ratio (ref 0.0–5.0)
Cholesterol, Total: 99 mg/dL — ABNORMAL LOW (ref 100–199)
HDL: 35 mg/dL — ABNORMAL LOW (ref >39)
LDL CALC: 54 mg/dL (ref 0–99)
Triglycerides: 49 mg/dL (ref 0–149)
VLDL Cholesterol Cal: 10 mg/dL (ref 5–40)

## 2018-02-15 LAB — CBC WITH DIFFERENTIAL/PLATELET
BASOS ABS: 0.1 10*3/uL (ref 0.0–0.2)
Basos: 1 %
EOS (ABSOLUTE): 0.2 10*3/uL (ref 0.0–0.4)
EOS: 2 %
HEMATOCRIT: 35.4 % — AB (ref 37.5–51.0)
HEMOGLOBIN: 11.9 g/dL — AB (ref 13.0–17.7)
Immature Grans (Abs): 0 10*3/uL (ref 0.0–0.1)
Immature Granulocytes: 1 %
LYMPHS ABS: 1.6 10*3/uL (ref 0.7–3.1)
Lymphs: 20 %
MCH: 33 pg (ref 26.6–33.0)
MCHC: 33.6 g/dL (ref 31.5–35.7)
MCV: 98 fL — ABNORMAL HIGH (ref 79–97)
MONOS ABS: 0.7 10*3/uL (ref 0.1–0.9)
Monocytes: 9 %
NEUTROS ABS: 5.4 10*3/uL (ref 1.4–7.0)
Neutrophils: 67 %
Platelets: 220 10*3/uL (ref 150–450)
RBC: 3.61 x10E6/uL — ABNORMAL LOW (ref 4.14–5.80)
RDW: 12.5 % (ref 12.3–15.4)
WBC: 8 10*3/uL (ref 3.4–10.8)

## 2018-02-15 LAB — BMP8+EGFR
BUN / CREAT RATIO: 17 (ref 10–24)
BUN: 18 mg/dL (ref 8–27)
CHLORIDE: 100 mmol/L (ref 96–106)
CO2: 25 mmol/L (ref 20–29)
Calcium: 9.4 mg/dL (ref 8.6–10.2)
Creatinine, Ser: 1.06 mg/dL (ref 0.76–1.27)
GFR calc non Af Amer: 69 mL/min/{1.73_m2} (ref >59)
GFR, EST AFRICAN AMERICAN: 80 mL/min/{1.73_m2} (ref >59)
GLUCOSE: 144 mg/dL — AB (ref 65–99)
Potassium: 4.5 mmol/L (ref 3.5–5.2)
SODIUM: 141 mmol/L (ref 134–144)

## 2018-02-15 LAB — HEPATIC FUNCTION PANEL
ALT: 14 IU/L (ref 0–44)
AST: 28 IU/L (ref 0–40)
Albumin: 4.3 g/dL (ref 3.5–4.8)
Alkaline Phosphatase: 79 IU/L (ref 39–117)
BILIRUBIN TOTAL: 0.7 mg/dL (ref 0.0–1.2)
Bilirubin, Direct: 0.21 mg/dL (ref 0.00–0.40)
TOTAL PROTEIN: 6 g/dL (ref 6.0–8.5)

## 2018-02-15 LAB — VITAMIN D 25 HYDROXY (VIT D DEFICIENCY, FRACTURES): Vit D, 25-Hydroxy: 53.7 ng/mL (ref 30.0–100.0)

## 2018-02-21 ENCOUNTER — Telehealth: Payer: Self-pay | Admitting: Internal Medicine

## 2018-02-26 NOTE — Telephone Encounter (Signed)
ok 

## 2018-02-28 NOTE — Telephone Encounter (Signed)
Left message to call back  

## 2018-04-04 ENCOUNTER — Encounter: Payer: Self-pay | Admitting: Cardiology

## 2018-04-04 NOTE — Progress Notes (Signed)
Cardiology Office Note   Date:  04/05/2018   ID:  George Wang, George Wang 21-Jul-1945, MRN [id number]  PCP:  Chipper Herb, MD  Cardiologist:   No primary care provider on file. Referring:  Chipper Herb, MD  Chief Complaint  Patient presents with  . PVCs      History of Present Illness: George Wang is a 72 y.o. male who presents for evaluation of PVCs.    I saw him in 2016 for evaluation of PVCs.  He had a negative POET (Plain Old Exercise Treadmill).  He had a distant echo that was also normal and was done to evaluate PVCs.    He does not feel significant palpitations.  He might feel these once in a while.  He is limited by back pain and pain radiates down his legs.  With his usual activities he does not describe chest pressure, neck or arm discomfort.  He does not describe shortness of breath, PND or orthopnea but is not able to be quite as active as he would like.  He has been under emotional stress.  His grandson aged 8 died on Christmas Eve.  He also lost his daughter years ago when she was killed in a tornado.     Past Medical History:  Diagnosis Date  . Anemia    Iron deficiency  . Anxiety state, unspecified   . Aortic atherosclerosis ([place])   . Calculus of kidney    kidney stones  - normal BMP   . Cancer [facility])    Prostate  . Depressive disorder, not elsewhere classified   . Diverticulosis of colon (without mention of hemorrhage)   . Gout, unspecified   . Hyperlipidemia   . Hypertension, essential   . Irritable bowel syndrome   . Personal history of malignant neoplasm of prostate   . Type II or unspecified type diabetes mellitus without mention of complication, not stated as uncontrolled     Past Surgical History:  Procedure Laterality Date  . HERNIA REPAIR     left side  . PROSTATE SURGERY    . TONSILLECTOMY       Current Outpatient Medications  Medication Sig Dispense Refill  . acetaminophen (TYLENOL) 325 MG tablet Take 650 mg by mouth  every 6 (six) hours as needed for mild pain.    Marland Kitchen amLODipine (NORVASC) 10 MG tablet TAKE ONE (1) TABLET EACH DAY 30 tablet 0  . atorvastatin (LIPITOR) 80 MG tablet TAKE ONE (1) TABLET EACH DAY (Patient taking differently: Take 1/2 tablet by mouth daily) 90 tablet 1  . benazepril (LOTENSIN) 20 MG tablet TAKE ONE (1) TABLET EACH DAY 30 tablet 0  . beta carotene 25000 UNIT capsule Take 25,000 Units by mouth daily.    . Cholecalciferol (VITAMIN D) 2000 UNITS CAPS Take 2 capsules by mouth daily.     . clonazePAM (KLONOPIN) 0.5 MG tablet Take 0.5 tablets (0.25 mg total) by mouth 3 (three) times daily as needed for anxiety. 90 tablet 3  . FeFum-FePo-FA-B Cmp-C-Zn-Mn-Cu (SE-TAN PLUS) 162-115.2-1 MG CAPS TAKE ONE CAPSULE BY MOUTH TWICE A DAY 180 capsule 1  . Fiber POWD Take by mouth.    . fish oil-omega-3 fatty acids 1000 MG capsule Take 2 g by mouth daily.    Marland Kitchen glimepiride (AMARYL) 4 MG tablet TAKE 1/2 TABLET TWICE DAILY 90 tablet 0  . losartan-hydrochlorothiazide (HYZAAR) 100-25 MG tablet TAKE ONE (1) TABLET EACH DAY 30 tablet 0  . metFORMIN (GLUCOPHAGE) 500 MG  tablet TAKE 2 TABLETS EACH MORNING AND 1 TABLETEACH EVENING (Patient taking differently: TAKE 1 TABLETS EACH MORNING AND 1 TABLETEACH EVENING) 270 tablet 0  . Multiple Vitamins-Minerals (CENTRUM SILVER PO) Take 1 tablet by mouth daily.     Marland Kitchen omeprazole (PRILOSEC) 20 MG capsule TAKE ONE (1) CAPSULE EACH DAY 90 capsule 1  . PARoxetine (PAXIL) 40 MG tablet TAKE ONE (1) TABLET EACH DAY 30 tablet 0  . blood glucose meter kit and supplies Dispense based on patient and insurance preference. Use up to four times daily as directed. (FOR ICD-10 E10.9, E11.9). 1 each 1   No current facility-administered medications for this visit.     Allergies:   Patient has no known allergies.    Social History:  The patient  reports that he quit smoking about 39 years ago. He has never used smokeless tobacco. He reports that he drinks alcohol. He reports that he does  not use drugs.   Family History:  The patient's family history includes Cancer in his maternal aunt; Coronary artery disease (age of onset: 1) in his father; Heart attack in his paternal grandfather; Heart disease in his maternal grandfather; Heart disease (age of onset: 10) in his maternal grandmother; Heart disease (age of onset: 37) in his paternal grandfather; Hyperlipidemia in his mother; Hypertension in his mother and son; Other in his daughter; Pancreatic cancer in his paternal aunt; Prostate cancer in his father and paternal uncle.    ROS:  Please see the history of present illness.   Otherwise, review of systems are positive for none.   All other systems are reviewed and negative.    PHYSICAL EXAM: VS:  BP (!) 148/70   Pulse (!) 52   Ht 5' 4"  (1.626 m)   Wt 198 lb (89.8 kg)   BMI 33.99 kg/m  , BMI Body mass index is 33.99 kg/m. GENERAL:  Well appearing HEENT:  Pupils equal round and reactive, fundi not visualized, oral mucosa unremarkable NECK:  No jugular venous distention, waveform within normal limits, carotid upstroke brisk and symmetric, no bruits, no thyromegaly LYMPHATICS:  No cervical, inguinal adenopathy LUNGS:  Clear to auscultation bilaterally BACK:  No CVA tenderness CHEST:  Unremarkable HEART:  PMI not displaced or sustained,S1 and S2 within normal limits, no S3, no S4, no clicks, no rubs, 2 out of 6 right upper sternal border systolic murmur, no diastolic murmurs ABD:  Flat, positive bowel sounds normal in frequency in pitch, no bruits, no rebound, no guarding, no midline pulsatile mass, no hepatomegaly, no splenomegaly EXT:  2 plus pulses throughout, no edema, no cyanosis no clubbing SKIN:  No rashes no nodules NEURO:  Cranial nerves II through XII grossly intact, motor grossly intact throughout PSYCH:  Cognitively intact, oriented to person place and time    EKG:  EKG is ordered today. The ekg ordered 02/14/18 demonstrates sinus rhythm, rate 85, right axis  deviation, premature ventricular, no acute ST-T wave changes.   Recent Labs: 10/05/2017: TSH 0.675 02/14/2018: ALT 14; BUN 18; Creatinine, Ser 1.06; Hemoglobin 11.9; Platelets 220; Potassium 4.5; Sodium 141    Lipid Panel    Component Value Date/Time   CHOL 99 (L) 02/14/2018 1057   CHOL 103 11/15/2012 1108   TRIG 49 02/14/2018 1057   TRIG 70 12/30/2016 1151   TRIG 50 11/15/2012 1108   HDL 35 (L) 02/14/2018 1057   HDL 34 (L) 12/30/2016 1151   HDL 37 (L) 11/15/2012 1108   CHOLHDL 2.8 02/14/2018 1057  LDLCALC 54 02/14/2018 1057   LDLCALC 61 01/29/2014 1135   LDLCALC 56 11/15/2012 1108      Wt Readings from Last 3 Encounters:  04/05/18 198 lb (89.8 kg)  02/14/18 198 lb (89.8 kg)  10/05/17 200 lb (90.7 kg)      Other studies Reviewed: Additional studies/ records that were reviewed today include: echo, POET (Plain Old Exercise Treadmill). Review of the above records demonstrates:  Please see elsewhere in the note.     ASSESSMENT AND PLAN:  PVCs:    He is not feeling these.  Is had a long history of these.  I will work-up for structural heart disease as below.  THORACIC AORTIC ATHEROSCLEROSIS: Given the PVCs and the evidence of atherosclerosis I would like to make sure he has a structurally normal I would like to screen him with a stress test.  However, I do not think he be able to walk on a treadmill.  Therefore, he will have a [employer].  I will also check an echocardiogram given his heart murmur and PVCs.  DM:    His A1c was 6.9.  No change in therapy.  DYSLIPIDEMIA: He has an excellent lipid profile.  No change in therapy.  HTN: His blood pressure is well controlled.  Current medicines are reviewed at length with the patient today.  The patient does not have concerns regarding medicines.  The following changes have been made:  no change  Labs/ tests ordered today include:  Orders Placed This Encounter  Procedures  . MYOCARDIAL PERFUSION IMAGING  .  ECHOCARDIOGRAM COMPLETE     Disposition:   FU with me after the testing.    Signed, Minus Breeding, MD  04/05/2018 4:05 PM     [facility]

## 2018-04-05 ENCOUNTER — Encounter: Payer: Self-pay | Admitting: Cardiology

## 2018-04-05 ENCOUNTER — Encounter: Payer: Self-pay | Admitting: *Deleted

## 2018-04-05 ENCOUNTER — Ambulatory Visit (INDEPENDENT_AMBULATORY_CARE_PROVIDER_SITE_OTHER): Payer: Medicare Other | Admitting: Cardiology

## 2018-04-05 VITALS — BP 148/70 | HR 52 | Ht 64.0 in | Wt 198.0 lb

## 2018-04-05 DIAGNOSIS — I10 Essential (primary) hypertension: Secondary | ICD-10-CM | POA: Diagnosis not present

## 2018-04-05 DIAGNOSIS — R011 Cardiac murmur, unspecified: Secondary | ICD-10-CM

## 2018-04-05 DIAGNOSIS — I700 Atherosclerosis of aorta: Secondary | ICD-10-CM

## 2018-04-05 DIAGNOSIS — E785 Hyperlipidemia, unspecified: Secondary | ICD-10-CM

## 2018-04-05 DIAGNOSIS — I493 Ventricular premature depolarization: Secondary | ICD-10-CM

## 2018-04-05 NOTE — Patient Instructions (Addendum)
Medication Instructions:  The current medical regimen is effective;  continue present plan and medications.  Testing/Procedures: Your physician has requested that you have an echocardiogram. Echocardiography is a painless test that uses sound waves to create images of your heart. It provides your doctor with information about the size and shape of your heart and how well your heart's chambers and valves are working. This procedure takes approximately one hour. There are no restrictions for this procedure.  Your physician has requested that you have a lexiscan myoview. For further information please visit [email or web address]. Please follow instruction sheet, as given.  Follow-Up: Follow up with Dr. Percival Spanish after the above testing.  Thank you for choosing [facility]!!

## 2018-04-10 ENCOUNTER — Other Ambulatory Visit: Payer: Self-pay | Admitting: Family Medicine

## 2018-04-11 ENCOUNTER — Telehealth (HOSPITAL_COMMUNITY): Payer: Self-pay | Admitting: *Deleted

## 2018-04-11 NOTE — Telephone Encounter (Signed)
Left message on voicemail in reference to upcoming appointment scheduled for 04/17/18. Phone number given for a call back so details instructions can be given. Ryka Beighley, [place]

## 2018-04-12 ENCOUNTER — Telehealth (HOSPITAL_COMMUNITY): Payer: Self-pay | Admitting: *Deleted

## 2018-04-12 NOTE — Telephone Encounter (Signed)
Patient given detailed instructions per Myocardial Perfusion Study Information Sheet for the test on 04/17/18 at 10:15. Patient notified to arrive 15 minutes early and that it is imperative to arrive on time for appointment to keep from having the test rescheduled.  If you need to cancel or reschedule your appointment, please call the office within 24 hours of your appointment. . Patient verbalized understanding.George Wang

## 2018-04-17 ENCOUNTER — Ambulatory Visit (HOSPITAL_BASED_OUTPATIENT_CLINIC_OR_DEPARTMENT_OTHER): Payer: Medicare Other

## 2018-04-17 ENCOUNTER — Other Ambulatory Visit: Payer: Self-pay

## 2018-04-17 ENCOUNTER — Ambulatory Visit (HOSPITAL_COMMUNITY): Payer: Medicare Other | Attending: Internal Medicine

## 2018-04-17 DIAGNOSIS — I493 Ventricular premature depolarization: Secondary | ICD-10-CM | POA: Diagnosis not present

## 2018-04-17 DIAGNOSIS — E119 Type 2 diabetes mellitus without complications: Secondary | ICD-10-CM | POA: Diagnosis not present

## 2018-04-17 DIAGNOSIS — R011 Cardiac murmur, unspecified: Secondary | ICD-10-CM | POA: Diagnosis not present

## 2018-04-17 DIAGNOSIS — M549 Dorsalgia, unspecified: Secondary | ICD-10-CM | POA: Diagnosis not present

## 2018-04-17 DIAGNOSIS — I10 Essential (primary) hypertension: Secondary | ICD-10-CM | POA: Insufficient documentation

## 2018-04-17 DIAGNOSIS — E785 Hyperlipidemia, unspecified: Secondary | ICD-10-CM | POA: Diagnosis not present

## 2018-04-17 DIAGNOSIS — I080 Rheumatic disorders of both mitral and aortic valves: Secondary | ICD-10-CM | POA: Diagnosis not present

## 2018-04-17 DIAGNOSIS — I429 Cardiomyopathy, unspecified: Secondary | ICD-10-CM | POA: Insufficient documentation

## 2018-04-17 LAB — MYOCARDIAL PERFUSION IMAGING
CHL CUP RESTING HR STRESS: 66 {beats}/min
LVDIAVOL: 73 mL (ref 62–150)
LVSYSVOL: 32 mL
NUC STRESS TID: 0.96
Peak HR: 100 {beats}/min
SDS: 0
SRS: 0
SSS: 0

## 2018-04-17 LAB — ECHOCARDIOGRAM COMPLETE
HEIGHTINCHES: 64 in
Weight: 3168 oz

## 2018-04-17 MED ORDER — TECHNETIUM TC 99M TETROFOSMIN IV KIT
30.6000 | PACK | Freq: Once | INTRAVENOUS | Status: AC | PRN
  Administered 2018-04-17: 30.6 via INTRAVENOUS
  Filled 2018-04-17: qty 31

## 2018-04-17 MED ORDER — PERFLUTREN LIPID MICROSPHERE
1.0000 mL | INTRAVENOUS | Status: AC | PRN
  Administered 2018-04-17: 2 mL via INTRAVENOUS

## 2018-04-17 MED ORDER — REGADENOSON 0.4 MG/5ML IV SOLN
0.4000 mg | Freq: Once | INTRAVENOUS | Status: AC
  Administered 2018-04-17: 0.4 mg via INTRAVENOUS

## 2018-04-17 MED ORDER — TECHNETIUM TC 99M TETROFOSMIN IV KIT
10.4000 | PACK | Freq: Once | INTRAVENOUS | Status: AC | PRN
  Administered 2018-04-17: 10.4 via INTRAVENOUS
  Filled 2018-04-17: qty 11

## 2018-04-18 ENCOUNTER — Other Ambulatory Visit: Payer: Self-pay | Admitting: Family Medicine

## 2018-04-24 ENCOUNTER — Other Ambulatory Visit: Payer: Self-pay | Admitting: Family Medicine

## 2018-05-02 ENCOUNTER — Ambulatory Visit (INDEPENDENT_AMBULATORY_CARE_PROVIDER_SITE_OTHER): Payer: Medicare Other | Admitting: *Deleted

## 2018-05-02 DIAGNOSIS — Z23 Encounter for immunization: Secondary | ICD-10-CM | POA: Diagnosis not present

## 2018-05-08 ENCOUNTER — Encounter: Payer: Self-pay | Admitting: Internal Medicine

## 2018-05-16 NOTE — Progress Notes (Signed)
Cardiology Office Note   Date:  05/17/2018   ID:  George, Wang June 07, 1946, MRN [id number]  PCP:  Chipper Herb, MD  Cardiologist:   No primary care provider on file.   Chief Complaint  Patient presents with  . PVCs      History of Present Illness: George Wang is a 72 y.o. male who presents for evaluation of PVCs.    I saw him in 2016 for evaluation of PVCs.  He had a negative POET (Plain Old Exercise Treadmill).  He had a distant echo that was also normal and was done to evaluate PVCs.  I saw him recently for evaluation of PVCs.   An echo done last year demonstrated no significant abnormalities.   Perfusion testing was negative.      He stays active on his job.  He sometimes even has to do some hand shoveling.  He denies any chest pressure, neck or arm discomfort.  He has no PND or orthopnea.  He has some rare palpitations but he says they passed quickly.  He has had no presyncope or syncope.   Past Medical History:  Diagnosis Date  . Anemia    Iron deficiency  . Anxiety state, unspecified   . Aortic atherosclerosis ([place])   . Calculus of kidney    kidney stones  - normal BMP   . Cancer [facility])    Prostate  . Depressive disorder, not elsewhere classified   . Diverticulosis of colon (without mention of hemorrhage)   . Gout, unspecified   . Hyperlipidemia   . Hypertension, essential   . Irritable bowel syndrome   . Personal history of malignant neoplasm of prostate   . Type II or unspecified type diabetes mellitus without mention of complication, not stated as uncontrolled     Past Surgical History:  Procedure Laterality Date  . HERNIA REPAIR     left side  . PROSTATE SURGERY    . TONSILLECTOMY       Current Outpatient Medications  Medication Sig Dispense Refill  . acetaminophen (TYLENOL) 325 MG tablet Take 650 mg by mouth every 6 (six) hours as needed for mild pain.    Marland Kitchen amLODipine (NORVASC) 10 MG tablet TAKE ONE (1) TABLET EACH DAY 90  tablet 0  . atorvastatin (LIPITOR) 80 MG tablet TAKE ONE (1) TABLET EACH DAY (Patient taking differently: Take 1/2 tablet by mouth daily) 90 tablet 1  . benazepril (LOTENSIN) 20 MG tablet TAKE ONE (1) TABLET EACH DAY 90 tablet 0  . beta carotene 25000 UNIT capsule Take 25,000 Units by mouth daily.    . Cholecalciferol (VITAMIN D) 2000 UNITS CAPS Take 2 capsules by mouth daily.     . clonazePAM (KLONOPIN) 0.5 MG tablet Take 0.5 tablets (0.25 mg total) by mouth 3 (three) times daily as needed for anxiety. 90 tablet 3  . FeFum-FePo-FA-B Cmp-C-Zn-Mn-Cu (SE-TAN PLUS) 162-115.2-1 MG CAPS TAKE ONE CAPSULE BY MOUTH TWICE A DAY 180 capsule 1  . Fiber POWD Take by mouth.    . fish oil-omega-3 fatty acids 1000 MG capsule Take 2 g by mouth daily.    Marland Kitchen glimepiride (AMARYL) 4 MG tablet TAKE 1/2 TABLET TWICE DAILY 90 tablet 0  . losartan-hydrochlorothiazide (HYZAAR) 100-25 MG tablet TAKE ONE (1) TABLET EACH DAY 90 tablet 0  . metFORMIN (GLUCOPHAGE) 500 MG tablet TAKE 2 TABLETS EACH MORNING AND 1 TABLETEACH EVENING (Patient taking differently: TAKE 1 TABLETS EACH MORNING AND 1 TABLETEACH EVENING) 270 tablet  0  . Multiple Vitamins-Minerals (CENTRUM SILVER PO) Take 1 tablet by mouth daily.     Marland Kitchen omeprazole (PRILOSEC) 20 MG capsule TAKE ONE (1) CAPSULE EACH DAY 90 capsule 0  . PARoxetine (PAXIL) 40 MG tablet TAKE ONE (1) TABLET EACH DAY 90 tablet 0  . blood glucose meter kit and supplies Dispense based on patient and insurance preference. Use up to four times daily as directed. (FOR ICD-10 E10.9, E11.9). 1 each 1   No current facility-administered medications for this visit.     Allergies:   Patient has no known allergies.     ROS:  Please see the history of present illness.   Otherwise, review of systems are positive for none.   All other systems are reviewed and negative.    PHYSICAL EXAM: VS:  BP (!) 150/82   Pulse 60   Ht 5' 4" (1.626 m)   Wt 203 lb (92.1 kg)   BMI 34.84 kg/m  , BMI Body mass index  is 34.84 kg/m. GENERAL:  Well appearing NECK:  No jugular venous distention, waveform within normal limits, carotid upstroke brisk and symmetric, no bruits, no thyromegaly LUNGS:  Clear to auscultation bilaterally CHEST:  Unremarkable HEART:  PMI not displaced or sustained,S1 and S2 within normal limits, no S3, no S4, no clicks, no rubs, no murmurs ABD:  Flat, positive bowel sounds normal in frequency in pitch, no bruits, no rebound, no guarding, no midline pulsatile mass, no hepatomegaly, no splenomegaly EXT:  2 plus pulses throughout, trace edema, no cyanosis no clubbing    EKG:  EKG is not ordered today.   Recent Labs: 10/05/2017: TSH 0.675 02/14/2018: ALT 14; BUN 18; Creatinine, Ser 1.06; Hemoglobin 11.9; Platelets 220; Potassium 4.5; Sodium 141    Lipid Panel    Component Value Date/Time   CHOL 99 (L) 02/14/2018 1057   CHOL 103 11/15/2012 1108   TRIG 49 02/14/2018 1057   TRIG 70 12/30/2016 1151   TRIG 50 11/15/2012 1108   HDL 35 (L) 02/14/2018 1057   HDL 34 (L) 12/30/2016 1151   HDL 37 (L) 11/15/2012 1108   CHOLHDL 2.8 02/14/2018 1057   LDLCALC 54 02/14/2018 1057   LDLCALC 61 01/29/2014 1135   LDLCALC 56 11/15/2012 1108      Wt Readings from Last 3 Encounters:  05/17/18 203 lb (92.1 kg)  04/17/18 198 lb (89.8 kg)  04/05/18 198 lb (89.8 kg)      Other studies Reviewed: Additional studies/ records that were reviewed today include:  None Review of the above records demonstrates:     ASSESSMENT AND PLAN:  PVCs:    He is not feeling any of these.  No change in therapy.  He will let me know if he has any symptoms going forward.   THORACIC AORTIC ATHEROSCLEROSIS:   He will continue with aggressive risk reduction.  No further testing at this point.  DM:    His A1c was 6.9.  He will continue with medical management and risk reduction.  We talked about diet.   DYSLIPIDEMIA:   He has a good lipid profile as above.  No change in therapy.  HTN:   Blood pressure is  slightly elevated today as it was last time.  He says it is well controlled at home.  He is going to keep a blood pressure diary for 2 weeks and I will make changes based on these readings.  He needs therapeutic lifestyle changes to include weight loss.  Current medicines are  reviewed at length with the patient today.  The patient does not have concerns regarding medicines.  The following changes have been made:  None  Labs/ tests ordered today include:  None  No orders of the defined types were placed in this encounter.    Disposition:   FU with me in one year    Signed, Minus Breeding, MD  05/17/2018 2:30 PM    [place]

## 2018-05-17 ENCOUNTER — Ambulatory Visit (INDEPENDENT_AMBULATORY_CARE_PROVIDER_SITE_OTHER): Payer: Medicare Other | Admitting: Cardiology

## 2018-05-17 ENCOUNTER — Encounter: Payer: Self-pay | Admitting: Cardiology

## 2018-05-17 VITALS — BP 150/82 | HR 60 | Ht 64.0 in | Wt 203.0 lb

## 2018-05-17 DIAGNOSIS — I10 Essential (primary) hypertension: Secondary | ICD-10-CM

## 2018-05-17 DIAGNOSIS — I700 Atherosclerosis of aorta: Secondary | ICD-10-CM

## 2018-05-17 DIAGNOSIS — E785 Hyperlipidemia, unspecified: Secondary | ICD-10-CM

## 2018-05-17 DIAGNOSIS — I493 Ventricular premature depolarization: Secondary | ICD-10-CM | POA: Diagnosis not present

## 2018-05-17 NOTE — Patient Instructions (Signed)
Medication Instructions:  The current medical regimen is effective;  continue present plan and medications.  If you need a refill on your cardiac medications before your next appointment, please call your pharmacy.   Follow-Up: Follow up in 1 year with Dr. Hochrein.  You will receive a letter in the mail 2 months before you are due.  Please call us when you receive this letter to schedule your follow up appointment.  Thank you for choosing [facility]!!     

## 2018-05-30 ENCOUNTER — Ambulatory Visit (INDEPENDENT_AMBULATORY_CARE_PROVIDER_SITE_OTHER): Payer: Medicare Other | Admitting: *Deleted

## 2018-05-30 ENCOUNTER — Encounter: Payer: Self-pay | Admitting: *Deleted

## 2018-05-30 VITALS — BP 122/66 | HR 75 | Ht 62.25 in | Wt 202.0 lb

## 2018-05-30 DIAGNOSIS — Z0000 Encounter for general adult medical examination without abnormal findings: Secondary | ICD-10-CM

## 2018-05-30 NOTE — Patient Instructions (Addendum)
George Wang , Thank you for taking time to come for your Medicare Wellness Visit. I appreciate your ongoing commitment to your health goals. Please review the following plan we discussed and let me know if I can assist you in the future.   These are the goals that we discussed:  Goals    . Exercise 150 min/wk Moderate Activity     Chair exercises or water exercises     . Have 3 meals a day     Plan 3 meals a day. Have a larger breakfast, medium lunch, and smaller supper. This will help balance out your blood sugar. Eat mostly vegetables and lean proteins. Small amounts of low sugar fruits are also ago. Avoid very ripe fruits.     . Prevent Falls     Add motion sensor nightlights in pathway to bathroom.       This is a list of the screening recommended for you and due dates:  Health Maintenance  Topic Date Due  . Colon Cancer Screening  05/01/2018  . Eye exam for diabetics  05/11/2018  .  Hepatitis C: One time screening is recommended by Center for Disease Control  (CDC) for  adults born from [birth year] through 1967.   02/15/2019*  . Stool Blood Test  06/21/2018  . Hemoglobin A1C  08/17/2018  . Complete foot exam   02/15/2019  . Tetanus Vaccine  05/19/2021  . Flu Shot  Completed  . Pneumonia vaccines  Completed  *Topic was postponed. The date shown is not the original due date.   Consider filling living will and power of attorney and bring a copy to our office.   8 Easy Exercises You Can Do Sitting Down  Got a chair? Then you're ready for this sit-down, total-body workout!.  Safety precaution: Pay attention to your body during the movements - if anything hurts or causes pain, stop immediately. And check with your doctor first before beginning this, or any, exercise program.   Sunshine Arm Circles Seated in a chair with good posture, hold a ball in both hands with arms extended above your head and/or in front of you, keeping elbows slightly bent. Visualizing the face of a clock  out in front of you, begin by holding arms up overhead at 12 o'clock. Circle the ball around to go all the way around the clock in a controlled, fluid motion. When you've reached 12 o'clock again, reverse directions and circle the opposite way. Keep alternating circle directions for 8 repetitions. Rest. Do another set of 8 repetitions.  Modification: A ball is not required for this exercise. Imagine that you are holding a ball while performing the motion. If it is difficult to bring your arms overhead, extend them out in front of you and move arms as if drawing a circle on the wall with or without the ball.   Tummy Twists Seated in a chair with good posture, hold a ball with both hands close to the body, with elbows bent and pulled in close to the ribcage. Slowly rotate your torso to the right as far as you comfortably can, being sure to keep the rest of your body still and stable. Rotate back to the center and repeat in the opposite direction. Do this 8 times, with two twists counting as a full set. Rest. Do another 8 sets (two twists each). Modification: A ball is not required for this exercise. Imagine you are holding a ball while performing the motion, or hold a small  object such as a can of soup or water bottle to add resistance   Ball Chest Press Seated in a chair with good posture, hold a ball with both hands at chest level, palms facing toward each other and elbows bent. Avoid bending forward by keeping your shoulders back at all times. Squeeze the ball slightly as you push the ball away from you in a fluid motion, taking about 2 seconds to extend the arms. Squeeze your shoulder blades together as you pull the ball back toward your chest. Repeat the push and pull motion 10 to 15 times. Rest. Do another set of 10 to 15 repetitions.  Modification: For a greater challenge, add a Tai Chi feel by standing with one leg slightly in front of the other (with a chair nearby if needed  for extra balance) and slowly rocking the entire body forward and back as you push the ball away and pull back in.     Front Arm Raises In a seated position with good posture, hold a ball in both hands with palms facing each other. Extend the arms out in front of your body, keeping your elbows slightly bent. Starting with the ball lowered toward the knees, slowly raise your arms to lift the ball up to shoulder level (no higher), then lower the ball back to the starting position, taking about 2 to 3 seconds to lift and lower. Repeat 10 to 15 times. Rest. Do another set of 10 to 15 repetitions. Modification: A ball is not required for this exercise. Imagine you are holding a ball as you perform the motion, or hold a small object, such as a can of soup or water bottle for added resistance.        Inner Thigh Squeezes Sitting toward the edge of a chair with good posture and knees bent, place a ball in between your knees; press the knees together to squeeze the ball, taking about 1 to 2 seconds to squeeze. You should feel the resistance in your inner thighs. Slowly release, keeping slight tension on the ball so that it does not fall. Repeat 8 to 10 times. Rest. Do another set of 8 to 10 repetitions. Modification: For a greater challenge, change the count of the squeezes by squeezing the ball and holding for 5 seconds, then releasing again. Or, do short, quick pulsing squeezes.     Knee Extensions Sitting toward the edge of a chair with good posture and bent knees, hold on to the sides of the chair with your hands. Extend the right knee out so that the toes come up toward the ceiling, being sure to keep the knee slightly bent without locking it through the entire movement. Lower the leg back to a bent position and repeat this movement 8 to 10 times, using about 2 seconds each to lift and lower the leg. Switch to the opposite leg and perform 8 to 10 repetitions. Rest briefly.  Do another set of 8 to 10 repetitions for each leg. Modification: If you are more advanced, sitting in the same position as above, extend one leg out in front of you with toes pointed to the ceiling. Lift and lower the entire leg only as high as you comfortably can, keeping the knee slightly bent. The longer lever adds difficulty to the exercise.   Elbow to Knee Seated toward the edge of a chair with good posture and knees bent, start with your right arm extended up overhead. Slowly lift the left knee up  as you lower your right elbow down toward your left knee, taking about 2 seconds to lower down. Try not to bend over at the waist. Release and go back to the starting position. Repeat 8 to 10 times. Switch sides and do 8 to 10 repetitions, pulling one elbow to the opposite knee. Rest. Do another set of 8 to 10 repetitions on each side. Modification: Try this (with a chair nearby for balance) exercise in a standing position for an increased range of motion.     Overhead Arm Extensions Seated in a chair with good posture, hold a ball with both hands and raise it up over your head, with arms extended without locking the elbows. Keeping the elbows pulled in toward the head, slowly bend the elbows to lower the ball down along the back of the neck, using about 2 seconds to go down, then 2 seconds to push the ball back up over your head. Repeat 8 to 10 times. Rest. Do another set of 8 to 10 repetitions. Modification: Try seated tricep extensions (ball not required for this modification). Bending slightly forward with elbows tucked into your sides, slowly extend the elbows so that your forearms go back behind you, keeping the elbows pulled up and in for the entire movement. Return to the starting position and repeat. Hold soup cans or small weights for added resistance.

## 2018-05-30 NOTE — Progress Notes (Addendum)
Subjective:   George Wang is a 72 y.o. male who presents for a subsequent Medicare Annual Wellness Visit.  Patient Care Team: Chipper Herb, MD as PCP - General (Family Medicine) Druscilla Brownie, MD (Dermatology) Irine Seal, MD (Urology) Minus Breeding, MD (Cardiology) Pyrtle, Lajuan Lines, MD as Consulting Physician (Gastroenterology)  Hospitalizations, surgeries, and ER visits in previous 12 months No hospitalizations, ER visits, or surgeries this past year.   Review of Systems    Patient reports that her overall health is unchanged compared to last year.  Cardiac Risk Factors include: advanced age (>56mn, >>31women);diabetes mellitus;dyslipidemia;male gender;hypertension;sedentary lifestyle;obesity (BMI >30kg/m2);smoking/ tobacco exposure  Musculoskeletal: joint and back pain from arthritis.   Endocrine: diabetic. Does not monitor blood sugar regularly. Does not eat regular meals. Occasionally has episodes of hypoglycemia. Keeps glucose tabs with him in case it drops. Has not used them recently.  All other systems negative     Current Medications (verified) Outpatient Encounter Medications as of 05/30/2018  Medication Sig  . acetaminophen (TYLENOL) 325 MG tablet Take 650 mg by mouth every 6 (six) hours as needed for mild pain.  .Marland KitchenamLODipine (NORVASC) 10 MG tablet TAKE ONE (1) TABLET EACH DAY  . atorvastatin (LIPITOR) 80 MG tablet TAKE ONE (1) TABLET EACH DAY (Patient taking differently: Take 1/2 tablet by mouth daily)  . benazepril (LOTENSIN) 20 MG tablet TAKE ONE (1) TABLET EACH DAY  . beta carotene 25000 UNIT capsule Take 25,000 Units by mouth daily.  . blood glucose meter kit and supplies Dispense based on patient and insurance preference. Use up to four times daily as directed. (FOR ICD-10 E10.9, E11.9).  .Marland KitchenCholecalciferol (VITAMIN D) 2000 UNITS CAPS Take 2 capsules by mouth daily.   . clonazePAM (KLONOPIN) 0.5 MG tablet Take 0.5 tablets (0.25 mg total) by  mouth 3 (three) times daily as needed for anxiety.  . FeFum-FePo-FA-B Cmp-C-Zn-Mn-Cu (SE-TAN PLUS) 162-115.2-1 MG CAPS TAKE ONE CAPSULE BY MOUTH TWICE A DAY  . Fiber POWD Take by mouth.  . fish oil-omega-3 fatty acids 1000 MG capsule Take 2 g by mouth daily.  .Marland Kitchenglimepiride (AMARYL) 4 MG tablet TAKE 1/2 TABLET TWICE DAILY  . losartan-hydrochlorothiazide (HYZAAR) 100-25 MG tablet TAKE ONE (1) TABLET EACH DAY  . metFORMIN (GLUCOPHAGE) 500 MG tablet TAKE 2 TABLETS EACH MORNING AND 1 TABLETEACH EVENING (Patient taking differently: TAKE 1 TABLETS EACH MORNING AND 1 TABLETEACH EVENING)  . Multiple Vitamins-Minerals (CENTRUM SILVER PO) Take 1 tablet by mouth daily.   .Marland Kitchenomeprazole (PRILOSEC) 20 MG capsule TAKE ONE (1) CAPSULE EACH DAY  . PARoxetine (PAXIL) 40 MG tablet TAKE ONE (1) TABLET EACH DAY  . [DISCONTINUED] febuxostat (ULORIC) 40 MG tablet Take 80 mg by mouth daily.  . [DISCONTINUED] simvastatin (ZOCOR) 80 MG tablet Take 80 mg by mouth at bedtime.   No facility-administered encounter medications on file as of 05/30/2018.     Allergies (verified) Patient has no known allergies.   History: Past Medical History:  Diagnosis Date  . Anemia    Iron deficiency  . Anxiety state, unspecified   . [place]   . Calculus of kidney    kidney stones  - normal BMP   . Cancer ([facility]    Prostate  . Depressive disorder, not elsewhere classified   . Diverticulosis of colon (without mention of hemorrhage)   . Gout, unspecified   . Hyperlipidemia   . Hypertension, essential   . Irritable bowel syndrome   . Personal history  of malignant neoplasm of prostate   . Type II or unspecified type diabetes mellitus without mention of complication, not stated as uncontrolled    Past Surgical History:  Procedure Laterality Date  . HERNIA REPAIR     left side  . PROSTATE SURGERY    . TONSILLECTOMY     Family History  Problem Relation Age of Onset  . Cancer Maternal Aunt        breast    . Pancreatic cancer Paternal Aunt   . Prostate cancer Paternal Uncle   . Heart disease Maternal Grandmother 69  . Heart disease Maternal Grandfather        fluid / CHF   . Heart disease Paternal Grandfather 17  . Heart attack Paternal Grandfather        passed at 68  . Prostate cancer Father   . Coronary artery disease Father 76       CABG  . Hypertension Mother   . Hyperlipidemia Mother   . Other Daughter        accident with tornado September 19, 1996  . Hypertension Son    Social History   Socioeconomic History  . Marital status: Married    Spouse name: Not on file  . Number of children: 2  . Years of education: 81  . Highest education level: Some college, no degree  Occupational History  . Occupation: Production designer, theatre/television/film    Comment: Part time  Social Needs  . Financial resource strain: Not hard at all  . Food insecurity:    Worry: Never true    Inability: Never true  . Transportation needs:    Medical: No    Non-medical: No  Tobacco Use  . Smoking status: Former Smoker    Packs/day: 1.00    Years: 19.00    Pack years: 19.00    Types: Cigarettes    Last attempt to quit: 10/13/1978    Years since quitting: 39.6  . Smokeless tobacco: Former Systems developer    Types: Snuff    Quit date: 05/30/2008  Substance and Sexual Activity  . Alcohol use: Yes    Comment: social   . Drug use: No  . Sexual activity: Yes  Lifestyle  . Physical activity:    Days per week: 0 days    Minutes per session: 0 min  . Stress: To some extent  Relationships  . Social connections:    Talks on phone: Three times a week    Gets together: More than three times a week    Attends religious service: Never    Active member of club or organization: No    Attends meetings of clubs or organizations: Never    Relationship status: Married  Other Topics Concern  . Not on file  Social History Narrative   Lives at home with wife.  George Wang has one son and a daughter that died in a tornado in 93. George Wang and his  son work together in their own business. George Wang has one living grandson and one grandson that passed away in Sep 19, 2016 at age 67 from cancer.     Clinical Intake:  Pre-visit preparation completed: No  Pain : 0-10(pain in the morning and stiffness. gets better throughout the day) Pain Score: 8  Pain Type: Chronic pain Pain Location: (generalized joint pain) Pain Descriptors / Indicators: Aching Pain Onset: More than a month ago Pain Frequency: Intermittent Pain Relieving Factors: tylenol and acitvity Effect of Pain on Daily Activities: minimal-slows him down  Pain Relieving Factors:  tylenol and acitvity  Nutritional Status: BMI > 30  Obese(Meals-2 to 3 meals a day. Light breakfast and light lunch or snack. Wife cooks most suppers.  Has some snacks in the evening. Drinks some water but drinks mostly diet soda. Doesn't drink much uring the day. Usually has soda and water and soda. ) Nutritional Risks: None Diabetes: No  How often do you need to have someone help you when you read instructions, pamphlets, or other written materials from your doctor or pharmacy?: 1 - Never What is the last grade level you completed in school?: some college     Information entered by :: Chong Sicilian, RN   Activities of Daily Living In your present state of health, do you have any difficulty performing the following activities: 05/30/2018  Hearing? 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 N  Comment Sees eye doctor yearly. Has appt on Friday.  Difficulty concentrating or making decisions? N  Comment has some trouble with names at times   Walking or climbing stairs? Y  Comment due to back pain  Dressing or bathing? N  Doing errands, shopping? N  Preparing Food and eating ? N  Using the Toilet? N  In the past six months, have you accidently leaked urine? Y  Comment has had some pelvic physical therapy and saw some improvement. Does not do them now. Has some dribbling since prostate out. Uses a light  pad.   Do you have problems with loss of bowel control? N  Managing your Medications? N  Managing your Finances? N  Housekeeping or managing your Housekeeping? N  Some recent data might be hidden     Exercise Current Exercise Habits: The patient has a physically strenous job, but has no regular exercise apart from work.(Moves daily with work. Lifts and strains and works with heavy equipment. ), Exercise limited by: orthopedic condition(s)   Depression Screen PHQ 2/9 Scores 02/14/2018 10/05/2017 05/16/2017 05/09/2017 12/30/2016 08/17/2016 04/14/2016  PHQ - 2 Score 5 4 3 1 1  0 0  PHQ- 9 Score 10 10 5  - - - -     Fall Risk Fall Risk  05/30/2018 02/14/2018 10/05/2017 05/16/2017 05/09/2017  Falls in the past year? (No Data) Yes Yes Yes No  Comment Stairs to upstairs but doesn't go often. Has handrails. No handrails on tub.  - - - -  Number falls in past yr: 1 1 2  or more 2 or more -  Injury with Fall? 0 No No No -  Risk for fall due to : History of fall(s) - - - -  Risk for fall due to: Comment - - - - -  Follow up Falls prevention discussed - - - -  Comment recommend a walking stick when walking in the woods - - - -     Objective:    Today's Vitals   05/30/18 1114 05/30/18 1117  BP: 122/66   Pulse: 75   Weight: 202 lb (91.6 kg)   Height: 5' 2.25" (1.581 m)   PainSc:  8    Body mass index is 36.65 kg/m.  Advanced Directives 05/30/2018 05/16/2017  Does Patient Have a Medical Advance Directive? No No  Would patient like information on creating a medical advance directive? Yes (MAU/Ambulatory/Procedural Areas - Information given) Yes (MAU/Ambulatory/Procedural Areas - Information given)    Hearing/Vision  No hearing or vision deficits noted during visit.  Cognitive Function: MMSE - Mini Mental State Exam 05/16/2017  Orientation to time 5  Orientation  to Place 5  Registration 3  Attention/ Calculation 5  Recall 2  Language- name 2 objects 2  Language- repeat 1  Language-  follow 3 step command 3  Language- read & follow direction 1  Write a sentence 1  Copy design 1  Total score 29     6CIT Screen 05/30/2018  What Year? 0 points  What month? 0 points  What time? 0 points  Count back from 20 0 points  Months in reverse 0 points  Repeat phrase 0 points  Total Score 0   Normal Cognitive Function Screening: Yes    Immunizations and Health Maintenance Immunization History  Administered Date(s) Administered  . Influenza, High Dose Seasonal PF 04/14/2016, 05/09/2017, 05/02/2018  . Influenza,inj,Quad PF,6+ Mos 05/14/2013, 05/02/2014, 04/18/2015  . Pneumococcal Conjugate-13 06/19/2013  . Pneumococcal Polysaccharide-23 01/15/2010  . Tdap 05/20/2011  . Zoster 01/17/2007   Health Maintenance Due  Topic Date Due  . COLONOSCOPY  05/01/2018  . OPHTHALMOLOGY EXAM  05/11/2018   Health Maintenance  Topic Date Due  . COLONOSCOPY  05/01/2018  . OPHTHALMOLOGY EXAM  05/11/2018  . Hepatitis C Screening  02/15/2019 (Originally 1945/11/04)  . COLON CANCER SCREENING ANNUAL FOBT  06/21/2018  . HEMOGLOBIN A1C  08/17/2018  . FOOT EXAM  02/15/2019  . TETANUS/TDAP  05/19/2021  . INFLUENZA VACCINE  Completed  . PNA vac Low Risk Adult  Completed        Assessment:   This is a routine wellness examination for George Wang.    Plan:    Goals    . Exercise 150 min/wk Moderate Activity     Chair exercises or water exercises     . Have 3 meals a day     Plan 3 meals a day. Have a larger breakfast, medium lunch, and smaller supper. This will help balance out your blood sugar. Eat mostly vegetables and lean proteins. Small amounts of low sugar fruits are also ago. Avoid very ripe fruits.     . Prevent Falls     Add motion sensor nightlights in pathway to bathroom.        Health Maintenance Recommendations: TD update if George Wang has a dirty wound  Eye exam-scheduled-They will send a report to our office   Additional Screening Recommendations: Lung: Low Dose CT  Chest recommended if Age 90-80 years, 30 pack-year currently smoking OR have quit w/in 15years. Patient does not qualify. Hepatitis C Screening recommended: no  Keep f/u with Chipper Herb, MD and any other specialty appointments you may have Continue current medications Discuss with Dr Hilarie Fredrickson the necessity of continuing omeprazole Move carefully to avoid falls. Use assistive devices like a cane or walker if needed. Use a walking stick in the woods.  Place night lights or motion activated nigh lights around house so that George Wang can see to go the bathroom at night Consider grief counseling or group therapy Strongly recommend [employer] so that George Wang can see how his diet affects his blood sugar. None available to day but will call when they come in.  Aim for at least 150 minutes of moderate activity a week. This can be done with chair exercises if necessary. Read or work on puzzles daily Stay connected with friends and family  I have personally reviewed and noted the following in the patient's chart:   . Medical and social history . Use of alcohol, tobacco or illicit drugs  . Current medications and supplements . Functional ability and status . Nutritional  status . Physical activity . Advanced directives . List of other physicians . Hospitalizations, surgeries, and ER visits in previous 12 months . Vitals . Screenings to include cognitive, depression, and falls . Referrals and appointments  In addition, I have reviewed and discussed with patient certain preventive protocols, quality metrics, and best practice recommendations. A written personalized care plan for preventive services as well as general preventive health recommendations were provided to patient.     Chong Sicilian, RN   05/30/2018   I have reviewed and agree with the above AWV documentation.   Mary-Margaret Hassell Done, FNP

## 2018-05-31 ENCOUNTER — Encounter: Payer: Self-pay | Admitting: *Deleted

## 2018-05-31 DIAGNOSIS — E7800 Pure hypercholesterolemia, unspecified: Secondary | ICD-10-CM

## 2018-05-31 DIAGNOSIS — I10 Essential (primary) hypertension: Secondary | ICD-10-CM

## 2018-05-31 DIAGNOSIS — E119 Type 2 diabetes mellitus without complications: Secondary | ICD-10-CM

## 2018-05-31 NOTE — Patient Instructions (Signed)
George Wang was given information about Chronic Care Management services today including:  1. CCM service includes personalized support from designated clinical staff supervised by his physician, including individualized plan of care and coordination with other care providers 2. 24/7 contact phone numbers for assistance for urgent and routine care needs. 3. Service will only be billed when office clinical staff spend 20 minutes or more in a month to coordinate care. 4. Only one practitioner may furnish and bill the service in a calendar month. 5. The patient may stop CCM services at any time (effective at the end of the month) by phone call to the office staff. 6. The patient will be responsible for cost sharing (co-pay) of up to 20% of the service fee (after annual deductible is met).  Patient agreed to services and verbal consent obtained.

## 2018-05-31 NOTE — Progress Notes (Signed)
This encounter was created in error - please disregard.

## 2018-06-02 DIAGNOSIS — Z7984 Long term (current) use of oral hypoglycemic drugs: Secondary | ICD-10-CM | POA: Diagnosis not present

## 2018-06-02 DIAGNOSIS — H2513 Age-related nuclear cataract, bilateral: Secondary | ICD-10-CM | POA: Diagnosis not present

## 2018-06-02 DIAGNOSIS — E119 Type 2 diabetes mellitus without complications: Secondary | ICD-10-CM | POA: Diagnosis not present

## 2018-06-02 LAB — HM DIABETES EYE EXAM

## 2018-06-06 ENCOUNTER — Ambulatory Visit (AMBULATORY_SURGERY_CENTER): Payer: Self-pay | Admitting: *Deleted

## 2018-06-06 VITALS — Ht 64.5 in | Wt 203.0 lb

## 2018-06-06 DIAGNOSIS — Z1211 Encounter for screening for malignant neoplasm of colon: Secondary | ICD-10-CM

## 2018-06-06 MED ORDER — NA SULFATE-K SULFATE-MG SULF 17.5-3.13-1.6 GM/177ML PO SOLN: 1.0000 | Freq: Once | ORAL | 0 refills | Status: AC

## 2018-06-06 NOTE — Progress Notes (Signed)
No egg or soy allergy known to patient  No issues with past sedation with any surgeries  or procedures, no intubation problems  No diet pills per patient No home 02 use per patient  No blood thinners per patient  Pt denies issues with constipation  No A fib or A flutter  EMMI video sent to pt's e mail  Pt. declined 

## 2018-06-07 ENCOUNTER — Telehealth: Payer: Self-pay | Admitting: *Deleted

## 2018-06-07 DIAGNOSIS — Z79899 Other long term (current) drug therapy: Secondary | ICD-10-CM

## 2018-06-07 DIAGNOSIS — I10 Essential (primary) hypertension: Secondary | ICD-10-CM

## 2018-06-07 NOTE — Telephone Encounter (Signed)
Received BP readings from pt today.  Dr Percival Spanish reviewed readings and gave orders to increase Lotensin to 20 mg twice daily and have BMP in 2 weeks.  Called and left message for pt to c/b to review instructions.  Will schedule f/u lab work at that time.

## 2018-06-08 MED ORDER — BENAZEPRIL HCL 20 MG PO TABS: 20.0000 mg | ORAL_TABLET | Freq: Two times a day (BID) | ORAL | 3 refills | Status: DC

## 2018-06-08 NOTE — Telephone Encounter (Signed)
Pt is aware to increase benazepril to 20 mg BID and to have blood work in 2 weeks.  He will have it drawn 12/5 at [facility]. RX has been sent into pharmacy and order was placed in Epic for lab.

## 2018-06-14 ENCOUNTER — Other Ambulatory Visit: Payer: Self-pay | Admitting: Family Medicine

## 2018-06-20 ENCOUNTER — Ambulatory Visit (AMBULATORY_SURGERY_CENTER): Payer: Medicare Other | Admitting: Internal Medicine

## 2018-06-20 ENCOUNTER — Encounter: Payer: Self-pay | Admitting: Internal Medicine

## 2018-06-20 VITALS — BP 116/68 | HR 65 | Temp 98.6°F | Resp 12 | Ht 64.0 in | Wt 203.0 lb

## 2018-06-20 DIAGNOSIS — D122 Benign neoplasm of ascending colon: Secondary | ICD-10-CM | POA: Diagnosis not present

## 2018-06-20 DIAGNOSIS — Z1211 Encounter for screening for malignant neoplasm of colon: Secondary | ICD-10-CM

## 2018-06-20 DIAGNOSIS — D123 Benign neoplasm of transverse colon: Secondary | ICD-10-CM

## 2018-06-20 DIAGNOSIS — E119 Type 2 diabetes mellitus without complications: Secondary | ICD-10-CM | POA: Diagnosis not present

## 2018-06-20 DIAGNOSIS — K219 Gastro-esophageal reflux disease without esophagitis: Secondary | ICD-10-CM | POA: Diagnosis not present

## 2018-06-20 DIAGNOSIS — I10 Essential (primary) hypertension: Secondary | ICD-10-CM | POA: Diagnosis not present

## 2018-06-20 MED ORDER — SODIUM CHLORIDE 0.9 % IV SOLN: 500.0000 mL | Freq: Once | INTRAVENOUS | Status: DC

## 2018-06-20 NOTE — Patient Instructions (Signed)
Handouts provided:  Polyps, Hemorrhoids, and Diverticulosis  YOU HAD AN ENDOSCOPIC PROCEDURE TODAY AT [facility]:   Refer to the procedure report that was given to you for any specific questions about what was found during the examination.  If the procedure report does not answer your questions, please call your gastroenterologist to clarify.  If you requested that your care partner not be given the details of your procedure findings, then the procedure report has been included in a sealed envelope for you to review at your convenience later.  YOU SHOULD EXPECT: Some feelings of bloating in the abdomen. Passage of more gas than usual.  Walking can help get rid of the air that was put into your GI tract during the procedure and reduce the bloating. If you had a lower endoscopy (such as a colonoscopy or flexible sigmoidoscopy) you may notice spotting of blood in your stool or on the toilet paper. If you underwent a bowel prep for your procedure, you may not have a normal bowel movement for a few days.  Please Note:  You might notice some irritation and congestion in your nose or some drainage.  This is from the oxygen used during your procedure.  There is no need for concern and it should clear up in a day or so.  SYMPTOMS TO REPORT IMMEDIATELY:   Following lower endoscopy (colonoscopy or flexible sigmoidoscopy):  Excessive amounts of blood in the stool  Significant tenderness or worsening of abdominal pains  Swelling of the abdomen that is new, acute  Fever of 100F or higher  For urgent or emergent issues, a gastroenterologist can be reached at any hour by calling [phone].   DIET:  We do recommend a small meal at first, but then you may proceed to your regular diet.  Drink plenty of fluids but you should avoid alcoholic beverages for 24 hours.  ACTIVITY:  You should plan to take it easy for the rest of today and you should NOT DRIVE or use heavy machinery until tomorrow  (because of the sedation medicines used during the test).    FOLLOW UP: Our staff will call the number listed on your records the next business day following your procedure to check on you and address any questions or concerns that you may have regarding the information given to you following your procedure. If we do not reach you, we will leave a message.  However, if you are feeling well and you are not experiencing any problems, there is no need to return our call.  We will assume that you have returned to your regular daily activities without incident.  If any biopsies were taken you will be contacted by phone or by letter within the next 1-3 weeks.  Please call us at [phone] if you have not heard about the biopsies in 3 weeks.    SIGNATURES/CONFIDENTIALITY: You and/or your care partner have signed paperwork which will be entered into your electronic medical record.  These signatures attest to the fact that that the information above on your After Visit Summary has been reviewed and is understood.  Full responsibility of the confidentiality of this discharge information lies with you and/or your care-partner.

## 2018-06-20 NOTE — Progress Notes (Signed)
Report to PACU, RN, vss, BBS= Clear.  

## 2018-06-20 NOTE — Progress Notes (Signed)
Pt's states no medical or surgical changes since previsit or office visit. 

## 2018-06-20 NOTE — Progress Notes (Signed)
Called to room to assist during endoscopic procedure.  Patient ID and intended procedure confirmed with present staff. Received instructions for my participation in the procedure from the performing physician.  

## 2018-06-21 ENCOUNTER — Other Ambulatory Visit: Payer: Medicare Other

## 2018-06-21 ENCOUNTER — Telehealth: Payer: Self-pay | Admitting: *Deleted

## 2018-06-21 DIAGNOSIS — I10 Essential (primary) hypertension: Secondary | ICD-10-CM | POA: Diagnosis not present

## 2018-06-21 DIAGNOSIS — Z79899 Other long term (current) drug therapy: Secondary | ICD-10-CM | POA: Diagnosis not present

## 2018-06-21 NOTE — Telephone Encounter (Signed)
  Follow up Call-  Call back number 06/20/2018  Post procedure Call Back phone  # [phone]  Permission to leave phone message Yes  Some recent data might be hidden     Patient questions:  Do you have a fever, pain , or abdominal swelling? No. Pain Score  0 *  Have you tolerated food without any problems? Yes.    Have you been able to return to your normal activities? Yes.    Do you have any questions about your discharge instructions: Diet   No. Medications  No. Follow up visit  No.  Do you have questions or concerns about your Care? No.  Actions: * If pain score is 4 or above: No action needed, pain <4.

## 2018-06-22 LAB — BASIC METABOLIC PANEL
BUN/Creatinine Ratio: 14 (ref 10–24)
BUN: 13 mg/dL (ref 8–27)
CO2: 23 mmol/L (ref 20–29)
Calcium: 9.1 mg/dL (ref 8.6–10.2)
Chloride: 99 mmol/L (ref 96–106)
Creatinine, Ser: 0.91 mg/dL (ref 0.76–1.27)
GFR calc Af Amer: 96 mL/min/{1.73_m2} (ref >59)
GFR calc non Af Amer: 83 mL/min/{1.73_m2} (ref >59)
Glucose: 190 mg/dL — ABNORMAL HIGH (ref 65–99)
Potassium: 3.6 mmol/L (ref 3.5–5.2)
Sodium: 140 mmol/L (ref 134–144)

## 2018-06-22 NOTE — Op Note (Signed)
[facility] Patient Name: Alicia Ackert Procedure Date: 06/20/2018 1:22 PM MRN: [id number] Endoscopist: Jerene Bears , MD Age: 72 Referring MD:  Date of Birth: [birth year] Gender: Male Account #: [id number] Procedure:                Colonoscopy Indications:              Screening for colorectal malignant neoplasm, Last                            colonoscopy 10 years ago Medicines:                Monitored Anesthesia Care Procedure:                Pre-Anesthesia Assessment:                           - Prior to the procedure, a History and Physical                            was performed, and patient medications and                            allergies were reviewed. The patient's tolerance of                            previous anesthesia was also reviewed. The risks                            and benefits of the procedure and the sedation                            options and risks were discussed with the patient.                            All questions were answered, and informed consent                            was obtained. Prior Anticoagulants: The patient has                            taken no previous anticoagulant or antiplatelet                            agents. ASA Grade Assessment: II - A patient with                            mild systemic disease. After reviewing the risks                            and benefits, the patient was deemed in                            satisfactory condition to undergo the procedure.  After obtaining informed consent, the colonoscope                            was passed under direct vision. Throughout the                            procedure, the patient's blood pressure, pulse, and                            oxygen saturations were monitored continuously. The                            Colonoscope was introduced through the anus and                            advanced to the cecum, identified by  appendiceal                            orifice and ileocecal valve. The colonoscopy was                            performed without difficulty. The patient tolerated                            the procedure well. The quality of the bowel                            preparation was good. The ileocecal valve,                            appendiceal orifice, and rectum were photographed. Scope In: 1:37:31 PM Scope Out: 1:52:22 PM Scope Withdrawal Time: 0 hours 10 minutes 7 seconds  Total Procedure Duration: 0 hours 14 minutes 51 seconds  Findings:                 The digital rectal exam was normal.                           Two sessile polyps were found in the ascending                            colon. The polyps were 5 to 6 mm in size. These                            polyps were removed with a cold snare. Resection                            and retrieval were complete.                           A 5 mm polyp was found in the transverse colon. The                            polyp was sessile.  The polyp was removed with a                            cold snare. Resection and retrieval were complete.                           Multiple small and large-mouthed diverticula were                            found in the sigmoid colon and descending colon.                           Internal hemorrhoids were found during                            retroflexion. The hemorrhoids were small. Complications:            No immediate complications. Estimated Blood Loss:     Estimated blood loss was minimal. Impression:               - Two 5 to 6 mm polyps in the ascending colon,                            removed with a cold snare. Resected and retrieved.                           - One 5 mm polyp in the transverse colon, removed                            with a cold snare. Resected and retrieved.                           - Severe diverticulosis in the sigmoid colon and in                            the  descending colon.                           - Internal hemorrhoids. Recommendation:           - Patient has a contact number available for                            emergencies. The signs and symptoms of potential                            delayed complications were discussed with the                            patient. Return to normal activities tomorrow.                            Written discharge instructions were provided to the  patient.                           - Resume previous diet.                           - Continue present medications.                           - Await pathology results.                           - Repeat colonoscopy is recommended for                            surveillance. The colonoscopy date will be                            determined after pathology results from today's                            exam become available for review. Jerene Bears, MD 06/20/2018 1:55:36 PM This report has been signed electronically.

## 2018-06-23 ENCOUNTER — Encounter: Payer: Self-pay | Admitting: Internal Medicine

## 2018-07-03 ENCOUNTER — Encounter: Payer: Self-pay | Admitting: Family Medicine

## 2018-07-03 ENCOUNTER — Ambulatory Visit (INDEPENDENT_AMBULATORY_CARE_PROVIDER_SITE_OTHER): Payer: Medicare Other | Admitting: Family Medicine

## 2018-07-03 VITALS — BP 121/67 | HR 70 | Temp 97.3°F | Ht 64.0 in | Wt 201.0 lb

## 2018-07-03 DIAGNOSIS — I700 Atherosclerosis of aorta: Secondary | ICD-10-CM

## 2018-07-03 DIAGNOSIS — E119 Type 2 diabetes mellitus without complications: Secondary | ICD-10-CM | POA: Diagnosis not present

## 2018-07-03 DIAGNOSIS — E7800 Pure hypercholesterolemia, unspecified: Secondary | ICD-10-CM | POA: Diagnosis not present

## 2018-07-03 DIAGNOSIS — C61 Malignant neoplasm of prostate: Secondary | ICD-10-CM | POA: Diagnosis not present

## 2018-07-03 DIAGNOSIS — I10 Essential (primary) hypertension: Secondary | ICD-10-CM

## 2018-07-03 DIAGNOSIS — E559 Vitamin D deficiency, unspecified: Secondary | ICD-10-CM

## 2018-07-03 LAB — BAYER DCA HB A1C WAIVED: HB A1C (BAYER DCA - WAIVED): 7.1 % — ABNORMAL HIGH (ref <7.0)

## 2018-07-03 NOTE — Progress Notes (Signed)
Subjective:    Patient ID: George Wang, male    DOB: [birth year], 72 y.o.   MRN: [id number]  HPI  Pt here for follow up and management of chronic medical problems which includes diabetes, hypertension and hyperlipidemia. He is taking medication regularly.  Patient is doing well today with no specific complaints.  Recently saw the gastroenterologist and had a colonoscopy.  Several polyps were found and also severe diverticulosis was noted.  We will continue to follow up with him on an as needed basis and as determined by the pathology of the polyps.  The patient is a diabetic and has hypertension and hyperlipidemia.  He has no complaints today and does not need any medicines refilled today.  In addition to his colon polyps, the patient also has had prostate cancer and deals with anxiety.  He has diabetes and has had kidney stones.  His weight is down a couple pounds since the last visit his vital signs are stable.  Patient is pleasant today and he is up-to-date on all of his healthcare maintenance issues.  He just completed having his colonoscopy and was told that he needed another one in 3 years.  He also sees the cardiologist yearly the urologist yearly and the neurosurgeon for his neck and back on an as needed basis as he has had shots recently to help his back and they were of some benefit.   Patient Active Problem List   Diagnosis Date Noted  . Murmur, cardiac 04/05/2018  . Dyslipidemia 04/05/2018  . Thoracic aortic atherosclerosis ([place]) 05/09/2017  . Prostate cancer ([place]) 05/09/2017  . Morbid obesity ([place]) 11/21/2015  . Hypertension 02/28/2013  . Hyperlipemia 02/28/2013  . Type 2 diabetes mellitus ([place]) 02/28/2013  . Vitamin D deficiency 02/28/2013  . Overweight(278.02) 10/13/2011  . Frequent PVCs 10/13/2011  . HEMOCCULT POSITIVE STOOL 08/14/2008  . Iron deficiency anemia 06/27/2008  . GOUT 04/25/2008  . ANXIETY 04/25/2008  . Depression 04/25/2008  . DIVERTICULOSIS, COLON  04/25/2008  . IRRITABLE BOWEL SYNDROME 04/25/2008  . NEPHROLITHIASIS 04/25/2008  . PROSTATE CANCER, HX OF 04/25/2008   Outpatient Encounter Medications as of 07/03/2018  Medication Sig  . acetaminophen (TYLENOL) 325 MG tablet Take 650 mg by mouth every 6 (six) hours as needed for mild pain.  Marland Kitchen amLODipine (NORVASC) 10 MG tablet TAKE ONE (1) TABLET EACH DAY  . atorvastatin (LIPITOR) 80 MG tablet TAKE ONE (1) TABLET EACH DAY (Patient taking differently: Take 1/2 tablet by mouth daily)  . benazepril (LOTENSIN) 20 MG tablet Take 1 tablet (20 mg total) by mouth 2 (two) times daily.  . beta carotene 25000 UNIT capsule Take 25,000 Units by mouth daily.  . blood glucose meter kit and supplies Dispense based on patient and insurance preference. Use up to four times daily as directed. (FOR ICD-10 E10.9, E11.9).  Marland Kitchen Cholecalciferol (VITAMIN D) 2000 UNITS CAPS Take 2 capsules by mouth daily.   . clonazePAM (KLONOPIN) 0.5 MG tablet Take 0.5 tablets (0.25 mg total) by mouth 3 (three) times daily as needed for anxiety. (Patient taking differently: Take 0.25 mg by mouth daily. )  . FeFum-FePo-FA-B Cmp-C-Zn-Mn-Cu (SE-TAN PLUS) 162-115.2-1 MG CAPS TAKE ONE CAPSULE BY MOUTH TWICE A DAY  . Fiber POWD Take by mouth.  . fish oil-omega-3 fatty acids 1000 MG capsule Take 2 g by mouth daily.  Marland Kitchen glimepiride (AMARYL) 4 MG tablet TAKE 1/2 TABLET TWICE DAILY  . losartan-hydrochlorothiazide (HYZAAR) 100-25 MG tablet TAKE ONE (1) TABLET EACH DAY  . metFORMIN (  GLUCOPHAGE) 500 MG tablet TAKE 2 TABLETS EACH MORNING AND 1 TABLETEACH EVENING (Patient taking differently: TAKE 1 TABLETS EACH MORNING AND 1 TABLETEACH EVENING)  . Multiple Vitamins-Minerals (CENTRUM SILVER PO) Take 1 tablet by mouth daily.   Marland Kitchen omeprazole (PRILOSEC) 20 MG capsule TAKE ONE (1) CAPSULE EACH DAY  . PARoxetine (PAXIL) 40 MG tablet TAKE ONE (1) TABLET EACH DAY  . [DISCONTINUED] febuxostat (ULORIC) 40 MG tablet Take 80 mg by mouth daily.  . [DISCONTINUED]  simvastatin (ZOCOR) 80 MG tablet Take 80 mg by mouth at bedtime.   No facility-administered encounter medications on file as of 07/03/2018.      Review of Systems  Constitutional: Negative.   HENT: Negative.   Eyes: Negative.   Respiratory: Negative.   Cardiovascular: Negative.   Gastrointestinal: Negative.   Endocrine: Negative.   Genitourinary: Negative.   Musculoskeletal: Negative.   Skin: Negative.   Allergic/Immunologic: Negative.   Neurological: Negative.   Hematological: Negative.   Psychiatric/Behavioral: Negative.        Objective:   Physical Exam Vitals signs and nursing note reviewed.  Constitutional:      Appearance: Normal appearance. He is well-developed. He is obese. He is not ill-appearing.  HENT:     Head: Normocephalic and atraumatic.     Comments: Some ear cerumen present on the right external arterial canal but TM appeared normal.    Right Ear: Tympanic membrane and external ear normal. There is impacted cerumen.     Left Ear: Tympanic membrane, ear canal and external ear normal. There is no impacted cerumen.     Nose: Nose normal. No congestion.     Mouth/Throat:     Mouth: Mucous membranes are moist.     Pharynx: Oropharynx is clear. No oropharyngeal exudate.  Eyes:     General: No scleral icterus.       Right eye: No discharge.        Left eye: No discharge.     Extraocular Movements: Extraocular movements intact.     Conjunctiva/sclera: Conjunctivae normal.     Pupils: Pupils are equal, round, and reactive to light.     Comments: Up-to-date on eye exams  Neck:     Musculoskeletal: Normal range of motion and neck supple.     Thyroid: No thyromegaly.     Vascular: No carotid bruit.     Trachea: No tracheal deviation.  Cardiovascular:     Rate and Rhythm: Normal rate and regular rhythm.     Pulses: Normal pulses.     Heart sounds: Normal heart sounds. No murmur. No friction rub. No gallop.      Comments: The heart is regular at  72/min Pulmonary:     Effort: Pulmonary effort is normal.     Breath sounds: Normal breath sounds. No wheezing or rales.     Comments: Clear anteriorly and posteriorly and no axillary adenopathy or chest wall masses Chest:     Chest wall: No tenderness.  Abdominal:     General: Bowel sounds are normal.     Palpations: Abdomen is soft. There is no mass.     Tenderness: There is no abdominal tenderness. There is no rebound.     Comments: Abdomen is obese without masses tenderness organ enlargement or bruits.  No inguinal adenopathy.  Genitourinary:    Comments: Patient sees Dr. Jeffie Pollock regularly Musculoskeletal: Normal range of motion.        General: No tenderness or deformity.     Right lower leg:  No edema.     Left lower leg: No edema.  Lymphadenopathy:     Cervical: No cervical adenopathy.  Skin:    General: Skin is warm and dry.     Findings: No rash.  Neurological:     General: No focal deficit present.     Mental Status: He is alert and oriented to person, place, and time. Mental status is at baseline.     Cranial Nerves: No cranial nerve deficit.     Motor: No weakness.     Deep Tendon Reflexes: Reflexes are normal and symmetric. Reflexes normal.  Psychiatric:        Mood and Affect: Mood normal.        Behavior: Behavior normal.        Thought Content: Thought content normal.        Judgment: Judgment normal.     Comments: Mood affect and behavior are normal for this patient.    BP 121/67 (BP Location: Left Arm)   Pulse 70   Temp (!) 97.3 F (36.3 C) (Oral)   Ht 5' 4"  (1.626 m)   Wt 201 lb (91.2 kg)   BMI 34.50 kg/m         Assessment & Plan:  1. Type 2 diabetes mellitus without complication, without long-term current use of insulin ([employer]) -Patient is encouraged to try to do better with diet and exercise to achieve weight loss and to check blood sugars more regularly and bring these readings in for each visit - CBC with Differential/Platelet - Bayer DCA Hb  A1c Waived  2. Pure hypercholesterolemia -Continue with current treatment and aggressive therapeutic lifestyle changes - CBC with Differential/Platelet - Lipid panel  3. Essential hypertension -Blood pressure is good today and he will continue with current treatment - BMP8+EGFR - CBC with Differential/Platelet - Hepatic function panel  4. Vitamin D deficiency -Continue with vitamin D replacement pending results of lab work - CBC with Differential/Platelet - VITAMIN D 25 Hydroxy (Vit-D Deficiency, Fractures)  5. Prostate cancer [facility]) -Follow-up with Dr. Jeffie Pollock as planned - CBC with Differential/Platelet  6. Thoracic aortic atherosclerosis ([place]) -Follow-up with Dr. Percival Spanish, cardiology as planned and follow as aggressive therapeutic lifestyle changes as possible and continue with statin therapy - CBC with Differential/Platelet  Patient Instructions                       Medicare Annual Wellness Visit  [facility] and the medical providers at [facility] strive to bring you the best medical care.  In doing so we not only want to address your current medical conditions and concerns but also to detect new conditions early and prevent illness, disease and health-related problems.    Medicare offers a yearly Wellness Visit which allows our clinical staff to assess your need for preventative services including immunizations, lifestyle education, counseling to decrease risk of preventable diseases and screening for fall risk and other medical concerns.    This visit is provided free of charge (no copay) for all Medicare recipients. The clinical pharmacists at [facility] have begun to conduct these Wellness Visits which will also include a thorough review of all your medications.    As you primary medical provider recommend that you make an appointment for your Annual Wellness Visit if you have not done so already this year.  You may set up this  appointment before you leave today or you may call back ([phone]) and schedule  an appointment.  Please make sure when you call that you mention that you are scheduling your Annual Wellness Visit with the clinical pharmacist so that the appointment may be made for the proper length of time.     Continue current medications. Continue good therapeutic lifestyle changes which include good diet and exercise. Fall precautions discussed with patient. If an FOBT was given today- please return it to our front desk. If you are over 65 years old - you may need Prevnar 40 or the adult Pneumonia vaccine.  **Flu shots are available--- please call and schedule a FLU-CLINIC appointment**  After your visit with [place] today you will receive a survey in the mail or online from [employer] regarding your care with [place]. Please take a moment to fill this out. Your feedback is very important to [place] as you can help [place] better understand your patient needs as well as improve your experience and satisfaction. WE CARE ABOUT YOU!!!   Continue to follow-up with urology as planned, cardiology yearly, eye exams yearly, and colonoscopy again in 3 years with Dr. Hilarie Fredrickson. Try to eat healthy stay active physically and make all efforts to reduce weight as much as possible Avoid falling and climbing Check blood sugars more regularly  Arrie Senate MD

## 2018-07-03 NOTE — Patient Instructions (Addendum)
Medicare Annual Wellness Visit  [facility] and the medical providers at [facility] strive to bring you the best medical care.  In doing so we not only want to address your current medical conditions and concerns but also to detect new conditions early and prevent illness, disease and health-related problems.    Medicare offers a yearly Wellness Visit which allows our clinical staff to assess your need for preventative services including immunizations, lifestyle education, counseling to decrease risk of preventable diseases and screening for fall risk and other medical concerns.    This visit is provided free of charge (no copay) for all Medicare recipients. The clinical pharmacists at [facility] have begun to conduct these Wellness Visits which will also include a thorough review of all your medications.    As you primary medical provider recommend that you make an appointment for your Annual Wellness Visit if you have not done so already this year.  You may set up this appointment before you leave today or you may call back ([phone]) and schedule an appointment.  Please make sure when you call that you mention that you are scheduling your Annual Wellness Visit with the clinical pharmacist so that the appointment may be made for the proper length of time.     Continue current medications. Continue good therapeutic lifestyle changes which include good diet and exercise. Fall precautions discussed with patient. If an FOBT was given today- please return it to our front desk. If you are over 53 years old - you may need Prevnar 14 or the adult Pneumonia vaccine.  **Flu shots are available--- please call and schedule a FLU-CLINIC appointment**  After your visit with [place] today you will receive a survey in the mail or online from [employer] regarding your care with [place]. Please take a moment to fill this out. Your feedback is very  important to [place] as you can help [place] better understand your patient needs as well as improve your experience and satisfaction. WE CARE ABOUT YOU!!!   Continue to follow-up with urology as planned, cardiology yearly, eye exams yearly, and colonoscopy again in 3 years with Dr. Hilarie Fredrickson. Try to eat healthy stay active physically and make all efforts to reduce weight as much as possible Avoid falling and climbing Check blood sugars more regularly

## 2018-07-04 LAB — HEPATIC FUNCTION PANEL
ALT: 19 IU/L (ref 0–44)
AST: 31 IU/L (ref 0–40)
Albumin: 4.1 g/dL (ref 3.5–4.8)
Alkaline Phosphatase: 80 IU/L (ref 39–117)
Bilirubin Total: 0.7 mg/dL (ref 0.0–1.2)
Bilirubin, Direct: 0.21 mg/dL (ref 0.00–0.40)
TOTAL PROTEIN: 5.9 g/dL — AB (ref 6.0–8.5)

## 2018-07-04 LAB — LIPID PANEL
CHOLESTEROL TOTAL: 108 mg/dL (ref 100–199)
Chol/HDL Ratio: 3 ratio (ref 0.0–5.0)
HDL: 36 mg/dL — ABNORMAL LOW (ref >39)
LDL Calculated: 57 mg/dL (ref 0–99)
Triglycerides: 74 mg/dL (ref 0–149)
VLDL Cholesterol Cal: 15 mg/dL (ref 5–40)

## 2018-07-04 LAB — BMP8+EGFR
BUN/Creatinine Ratio: 19 (ref 10–24)
BUN: 19 mg/dL (ref 8–27)
CO2: 26 mmol/L (ref 20–29)
Calcium: 9.6 mg/dL (ref 8.6–10.2)
Chloride: 98 mmol/L (ref 96–106)
Creatinine, Ser: 1.02 mg/dL (ref 0.76–1.27)
GFR, EST AFRICAN AMERICAN: 83 mL/min/{1.73_m2} (ref >59)
GFR, EST NON AFRICAN AMERICAN: 72 mL/min/{1.73_m2} (ref >59)
Glucose: 166 mg/dL — ABNORMAL HIGH (ref 65–99)
Potassium: 3.9 mmol/L (ref 3.5–5.2)
Sodium: 138 mmol/L (ref 134–144)

## 2018-07-04 LAB — CBC WITH DIFFERENTIAL/PLATELET
Basophils Absolute: 0.1 10*3/uL (ref 0.0–0.2)
Basos: 1 %
EOS (ABSOLUTE): 0.2 10*3/uL (ref 0.0–0.4)
Eos: 3 %
Hematocrit: 34.9 % — ABNORMAL LOW (ref 37.5–51.0)
Hemoglobin: 11.8 g/dL — ABNORMAL LOW (ref 13.0–17.7)
Immature Grans (Abs): 0 10*3/uL (ref 0.0–0.1)
Immature Granulocytes: 0 %
LYMPHS: 21 %
Lymphocytes Absolute: 1.6 10*3/uL (ref 0.7–3.1)
MCH: 31.9 pg (ref 26.6–33.0)
MCHC: 33.8 g/dL (ref 31.5–35.7)
MCV: 94 fL (ref 79–97)
Monocytes Absolute: 0.7 10*3/uL (ref 0.1–0.9)
Monocytes: 9 %
NEUTROS ABS: 5 10*3/uL (ref 1.4–7.0)
Neutrophils: 66 %
Platelets: 228 10*3/uL (ref 150–450)
RBC: 3.7 x10E6/uL — ABNORMAL LOW (ref 4.14–5.80)
RDW: 11.9 % — ABNORMAL LOW (ref 12.3–15.4)
WBC: 7.5 10*3/uL (ref 3.4–10.8)

## 2018-07-04 LAB — VITAMIN D 25 HYDROXY (VIT D DEFICIENCY, FRACTURES): Vit D, 25-Hydroxy: 49.7 ng/mL (ref 30.0–100.0)

## 2018-07-13 ENCOUNTER — Emergency Department (HOSPITAL_COMMUNITY): Payer: Medicare Other

## 2018-07-13 ENCOUNTER — Other Ambulatory Visit: Payer: Self-pay | Admitting: Family Medicine

## 2018-07-13 ENCOUNTER — Other Ambulatory Visit: Payer: Self-pay

## 2018-07-13 ENCOUNTER — Emergency Department (HOSPITAL_COMMUNITY)
Admission: EM | Admit: 2018-07-13 | Discharge: 2018-07-13 | Disposition: A | Discharge status: Home / Self Care | Payer: Medicare Other | Attending: Emergency Medicine | Admitting: Emergency Medicine

## 2018-07-13 ENCOUNTER — Encounter (HOSPITAL_COMMUNITY): Payer: Self-pay | Admitting: Emergency Medicine

## 2018-07-13 DIAGNOSIS — Z87891 Personal history of nicotine dependence: Secondary | ICD-10-CM | POA: Diagnosis not present

## 2018-07-13 DIAGNOSIS — Y9389 Activity, other specified: Secondary | ICD-10-CM | POA: Insufficient documentation

## 2018-07-13 DIAGNOSIS — E119 Type 2 diabetes mellitus without complications: Secondary | ICD-10-CM | POA: Diagnosis not present

## 2018-07-13 DIAGNOSIS — Y999 Unspecified external cause status: Secondary | ICD-10-CM | POA: Insufficient documentation

## 2018-07-13 DIAGNOSIS — I10 Essential (primary) hypertension: Secondary | ICD-10-CM | POA: Insufficient documentation

## 2018-07-13 DIAGNOSIS — S42032A Displaced fracture of lateral end of left clavicle, initial encounter for closed fracture: Secondary | ICD-10-CM

## 2018-07-13 DIAGNOSIS — W06XXXA Fall from bed, initial encounter: Secondary | ICD-10-CM | POA: Diagnosis not present

## 2018-07-13 DIAGNOSIS — Y92003 Bedroom of unspecified non-institutional (private) residence as the place of occurrence of the external cause: Secondary | ICD-10-CM | POA: Diagnosis not present

## 2018-07-13 DIAGNOSIS — Z79899 Other long term (current) drug therapy: Secondary | ICD-10-CM | POA: Insufficient documentation

## 2018-07-13 DIAGNOSIS — Z8546 Personal history of malignant neoplasm of prostate: Secondary | ICD-10-CM | POA: Diagnosis not present

## 2018-07-13 DIAGNOSIS — W19XXXA Unspecified fall, initial encounter: Secondary | ICD-10-CM

## 2018-07-13 DIAGNOSIS — Z7984 Long term (current) use of oral hypoglycemic drugs: Secondary | ICD-10-CM | POA: Diagnosis not present

## 2018-07-13 DIAGNOSIS — S0990XA Unspecified injury of head, initial encounter: Secondary | ICD-10-CM | POA: Diagnosis not present

## 2018-07-13 DIAGNOSIS — S199XXA Unspecified injury of neck, initial encounter: Secondary | ICD-10-CM | POA: Diagnosis not present

## 2018-07-13 MED ORDER — TRAMADOL HCL 50 MG PO TABS
50.0000 mg | ORAL_TABLET | Freq: Once | ORAL | Status: AC
  Administered 2018-07-13: 50 mg via ORAL
  Filled 2018-07-13: qty 1

## 2018-07-13 MED ORDER — TRAMADOL HCL 50 MG PO TABS: 50.0000 mg | ORAL_TABLET | Freq: Four times a day (QID) | ORAL | 0 refills | Status: DC | PRN

## 2018-07-13 NOTE — ED Triage Notes (Signed)
Patient states he was dreaming and fell out of the bed this morning, hitting his head and left shoulder on the floor. States he is unable to lift his left arm due to pain in left shoulder. Denies LOC.

## 2018-07-13 NOTE — Discharge Instructions (Addendum)
Wear the sling for comfort, as much as needed.  Use ice on the sore area 3 or 4 times a day for 3 days.  You can start some gentle exercises and heat treatment after that.  See your doctor for checkup in 1 week.

## 2018-07-13 NOTE — ED Provider Notes (Signed)
[facility] EMERGENCY DEPARTMENT Provider Note   CSN: [id number] Arrival date & time: 07/13/18  7209     History   Chief Complaint Chief Complaint  Patient presents with  . Fall    HPI George Wang is a 72 y.o. male.  HPI   He presents for evaluation of injuries to head and left shoulder when he accidentally rolled out of bed while sleeping early this morning.  He reports only pain in left clavicle at this time.  He was able to ambulate on his own, and came here by private vehicle with his son.  At this time he denies headache, neck pain, back pain, nausea, vomiting, weakness or dizziness.  There are no other known modifying factors.  Past Medical History:  Diagnosis Date  . Allergy    mild  . Anemia    Iron deficiency  . Anxiety state, unspecified   . Aortic atherosclerosis ([place])   . Arthritis   . Calculus of kidney    kidney stones  - normal BMP   . Cancer [facility])    Prostate, skin cancer x 2   . Cataract    forming both eyes  . Depressive disorder, not elsewhere classified   . Diverticulosis of colon (without mention of hemorrhage)   . GERD (gastroesophageal reflux disease)    on nexium  . Gout, unspecified   . Heart murmur   . Hyperlipidemia   . Hypertension, essential   . Irritable bowel syndrome   . Neuromuscular disorder (HCC)    numbness in knees from back issues   . Personal history of malignant neoplasm of prostate   . PVC (premature ventricular contraction)   . Type II or unspecified type diabetes mellitus without mention of complication, not stated as uncontrolled     Patient Active Problem List   Diagnosis Date Noted  . Murmur, cardiac 04/05/2018  . Dyslipidemia 04/05/2018  . Thoracic aortic atherosclerosis ([place]) 05/09/2017  . Prostate cancer ([place]) 05/09/2017  . Morbid obesity ([place]) 11/21/2015  . Hypertension 02/28/2013  . Hyperlipemia 02/28/2013  . Type 2 diabetes mellitus ([place]) 02/28/2013  . Vitamin D deficiency 02/28/2013  .  Overweight(278.02) 10/13/2011  . Frequent PVCs 10/13/2011  . HEMOCCULT POSITIVE STOOL 08/14/2008  . Iron deficiency anemia 06/27/2008  . GOUT 04/25/2008  . ANXIETY 04/25/2008  . Depression 04/25/2008  . DIVERTICULOSIS, COLON 04/25/2008  . IRRITABLE BOWEL SYNDROME 04/25/2008  . NEPHROLITHIASIS 04/25/2008  . PROSTATE CANCER, HX OF 04/25/2008    Past Surgical History:  Procedure Laterality Date  . COLONOSCOPY    . HERNIA REPAIR     left side  . PROSTATE SURGERY    . skin cancer removed     chest x 2 3-4 yrs ago   . TONSILLECTOMY          Home Medications    Prior to Admission medications   Medication Sig Start Date End Date Taking? Authorizing Provider  acetaminophen (TYLENOL) 325 MG tablet Take 650 mg by mouth every 6 (six) hours as needed for mild pain.   Yes [provider]  amLODipine (NORVASC) 10 MG tablet TAKE ONE (1) TABLET EACH DAY 04/19/18  Yes Chipper Herb, MD  atorvastatin (LIPITOR) 80 MG tablet TAKE ONE (1) TABLET EACH DAY Patient taking differently: Take 1/2 tablet by mouth daily 10/31/17  Yes Chipper Herb, MD  benazepril (LOTENSIN) 20 MG tablet Take 1 tablet (20 mg total) by mouth 2 (two) times daily. Patient taking differently: Take 40 mg  by mouth daily.  06/08/18  Yes Minus Breeding, MD  beta carotene 25000 UNIT capsule Take 25,000 Units by mouth daily.   Yes [provider]  blood glucose meter kit and supplies Dispense based on patient and insurance preference. Use up to four times daily as directed. (FOR ICD-10 E10.9, E11.9). 10/05/17  Yes Chipper Herb, MD  Cholecalciferol (VITAMIN D) 2000 UNITS CAPS Take 2 capsules by mouth daily.    Yes [provider]  clonazePAM (KLONOPIN) 0.5 MG tablet Take 0.5 tablets (0.25 mg total) by mouth 3 (three) times daily as needed for anxiety. Patient taking differently: Take 0.25 mg by mouth daily.  02/14/18  Yes Chipper Herb, MD  FeFum-FePo-FA-B Cmp-C-Zn-Mn-Cu (SE-TAN PLUS) [phone] MG  CAPS TAKE ONE CAPSULE BY MOUTH TWICE A DAY 04/11/18  Yes Chipper Herb, MD  Fiber POWD Take 1 Scoop by mouth daily as needed.    Yes [provider]  fish oil-omega-3 fatty acids 1000 MG capsule Take 1 g by mouth 2 (two) times daily.    Yes [provider]  glimepiride (AMARYL) 4 MG tablet TAKE 1/2 TABLET TWICE DAILY 06/16/18  Yes Chipper Herb, MD  losartan-hydrochlorothiazide [facility]) 100-25 MG tablet TAKE ONE (1) TABLET EACH DAY 04/19/18  Yes Chipper Herb, MD  metFORMIN (GLUCOPHAGE) 500 MG tablet TAKE 2 TABLETS EACH MORNING AND 1 [place] EVENING Patient taking differently: TAKE 1 TABLETS EACH MORNING AND 1 TABLETEACH EVENING 11/21/17  Yes Chipper Herb, MD  Multiple Vitamins-Minerals (CENTRUM SILVER PO) Take 1 tablet by mouth daily.    Yes [provider]  omeprazole (PRILOSEC) 20 MG capsule TAKE ONE (1) CAPSULE EACH DAY 04/24/18  Yes Chipper Herb, MD  PARoxetine (PAXIL) 40 MG tablet TAKE ONE (1) TABLET EACH DAY 04/19/18  Yes Chipper Herb, MD  traMADol (ULTRAM) 50 MG tablet Take 1 tablet (50 mg total) by mouth every 6 (six) hours as needed for moderate pain. 07/13/18   Daleen Bo, MD  febuxostat (ULORIC) 40 MG tablet Take 80 mg by mouth daily.  10/13/11  [provider]  simvastatin (ZOCOR) 80 MG tablet Take 80 mg by mouth at bedtime.  07/03/18  [provider]    Family History Family History  Problem Relation Age of Onset  . Cancer Maternal Aunt        breast  . Pancreatic cancer Paternal Aunt   . Prostate cancer Paternal Uncle   . Heart disease Maternal Grandmother 69  . Heart disease Maternal Grandfather        fluid / CHF   . Heart disease Paternal Grandfather 65  . Heart attack Paternal Grandfather        passed at 51  . Prostate cancer Father   . Coronary artery disease Father 40       CABG  . Hypertension Mother   . Hyperlipidemia Mother   . Other Daughter        accident with tornado 1998  . Hypertension Son     . Colon cancer Neg Hx   . Colon polyps Neg Hx   . Esophageal cancer Neg Hx   . Rectal cancer Neg Hx   . Stomach cancer Neg Hx     Social History Social History   Tobacco Use  . Smoking status: Former Smoker    Packs/day: 1.00    Years: 19.00    Pack years: 19.00    Types: Cigarettes    Last attempt to quit: 10/13/1978  Years since quitting: 39.7  . Smokeless tobacco: Former Systems developer    Types: Snuff    Quit date: 05/30/2008  Substance Use Topics  . Alcohol use: Yes    Comment: social   . Drug use: No     Allergies   Patient has no known allergies.   Review of Systems Review of Systems  All other systems reviewed and are negative.    Physical Exam Updated Vital Signs BP 131/71 (BP Location: Right Arm)   Pulse 74   Temp 98.7 F (37.1 C) (Oral)   Ht 5' 4"  (1.626 m)   Wt 91.2 kg   SpO2 95%   BMI 34.50 kg/m   Physical Exam Vitals signs and nursing note reviewed.  Constitutional:      Appearance: Normal appearance. He is well-developed. He is not ill-appearing or diaphoretic.     Comments: Elderly, overweight  HENT:     Head: Normocephalic and atraumatic.     Comments: No defect of scalp or skull.    Right Ear: External ear normal.     Left Ear: External ear normal.     Nose: No congestion or rhinorrhea.     Mouth/Throat:     Mouth: Mucous membranes are moist.     Pharynx: No oropharyngeal exudate or posterior oropharyngeal erythema.  Eyes:     Conjunctiva/sclera: Conjunctivae normal.     Pupils: Pupils are equal, round, and reactive to light.  Neck:     Musculoskeletal: Normal range of motion and neck supple.     Trachea: Phonation normal.  Cardiovascular:     Rate and Rhythm: Normal rate and regular rhythm.     Heart sounds: Normal heart sounds.  Pulmonary:     Effort: Pulmonary effort is normal.     Breath sounds: Normal breath sounds.  Abdominal:     Palpations: Abdomen is soft.     Tenderness: There is no abdominal tenderness.   Musculoskeletal:     Comments: He guards against movement of the left shoulder however I am able to range the humerus, passively without pain.  Tenderness with deformity of left distal clavicle.  No associated skin deformity.  Neurovascular intact distally in the left hand.  Skin:    General: Skin is warm and dry.  Neurological:     Mental Status: He is alert and oriented to person, place, and time.     Cranial Nerves: No cranial nerve deficit.     Sensory: No sensory deficit.     Motor: No abnormal muscle tone.     Coordination: Coordination normal.  Psychiatric:        Mood and Affect: Mood normal.        Behavior: Behavior normal.        Thought Content: Thought content normal.        Judgment: Judgment normal.      ED Treatments / Results  Labs (all labs ordered are listed, but only abnormal results are displayed) Labs Reviewed - No data to display  EKG EKG Interpretation  Date/Time:  Thursday July 13 2018 09:23:27 EST Ventricular Rate:  63 PR Interval:    QRS Duration: 100 QT Interval:  407 QTC Calculation: 417 R Axis:   -124 Text Interpretation:  Sinus rhythm Premature ventricular complexes Right axis deviation Abnormal R-wave progression, late transition Baseline wander in lead(s) V3 since last tracing no significant change Confirmed by Daleen Bo [phone]) on 07/13/2018 9:28:48 AM   Radiology Dg Clavicle Left  Result Date: 07/13/2018  CLINICAL DATA:  Left clavicle injury due to a fall out of bed this morning. Initial encounter. EXAM: LEFT CLAVICLE - 2+ VIEWS COMPARISON:  None. FINDINGS: The patient has a fracture of the distal diaphysis of the left clavicle. Fracture fragments are displaced approximately 1.2 cm and there is approximately 1/2 shaft width inferior displacement of the distal fragment. The acromioclavicular joint is intact and the glenohumeral joint is located. No other fracture is identified. Aortic atherosclerosis is noted. IMPRESSION: Acute  fracture of the distal left clavicle as described above. Electronically Signed   By: Inge Rise M.D.   On: 07/13/2018 10:03   Ct Head Wo Contrast  Result Date: 07/13/2018 CLINICAL DATA:  Fall, head injury EXAM: CT HEAD WITHOUT CONTRAST CT CERVICAL SPINE WITHOUT CONTRAST TECHNIQUE: Multidetector CT imaging of the head and cervical spine was performed following the standard protocol without intravenous contrast. Multiplanar CT image reconstructions of the cervical spine were also generated. COMPARISON:  MRI head 07/13/2012 FINDINGS: CT HEAD FINDINGS Brain: Mild atrophy. Mild chronic white matter changes. Negative for acute infarct, hemorrhage, mass Vascular: Negative for hyperdense vessel Skull: Negative for skull fracture Sinuses/Orbits: Negative Other: None CT CERVICAL SPINE FINDINGS Alignment: Normal Skull base and vertebrae: Negative for fracture Soft tissues and spinal canal: Negative Disc levels: Disc degeneration and spondylosis most prominent at C4-5, C5-6, C6-7 Upper chest: Negative Other: None IMPRESSION: 1. No acute intracranial abnormality 2. Cervical spondylosis without fracture. Electronically Signed   By: Franchot Gallo M.D.   On: 07/13/2018 10:38   Ct Cervical Spine Wo Contrast  Result Date: 07/13/2018 CLINICAL DATA:  Fall, head injury EXAM: CT HEAD WITHOUT CONTRAST CT CERVICAL SPINE WITHOUT CONTRAST TECHNIQUE: Multidetector CT imaging of the head and cervical spine was performed following the standard protocol without intravenous contrast. Multiplanar CT image reconstructions of the cervical spine were also generated. COMPARISON:  MRI head 07/13/2012 FINDINGS: CT HEAD FINDINGS Brain: Mild atrophy. Mild chronic white matter changes. Negative for acute infarct, hemorrhage, mass Vascular: Negative for hyperdense vessel Skull: Negative for skull fracture Sinuses/Orbits: Negative Other: None CT CERVICAL SPINE FINDINGS Alignment: Normal Skull base and vertebrae: Negative for fracture Soft  tissues and spinal canal: Negative Disc levels: Disc degeneration and spondylosis most prominent at C4-5, C5-6, C6-7 Upper chest: Negative Other: None IMPRESSION: 1. No acute intracranial abnormality 2. Cervical spondylosis without fracture. Electronically Signed   By: Franchot Gallo M.D.   On: 07/13/2018 10:38    Procedures Procedures (including critical care time)  Medications Ordered in ED Medications - No data to display   Initial Impression / Assessment and Plan / ED Course  I have reviewed the triage vital signs and the nursing notes.  Pertinent labs & imaging results that were available during my care of the patient were reviewed by me and considered in my medical decision making (see chart for details).  Clinical Course as of Jul 13 1113  Thu Jul 13, 2018  1110 No intracranial injury, images reviewed by me  CT Head Wo Contrast [EW]  1110 No fracture or step-off, images reviewed by me  CT Cervical Spine Wo Contrast [EW]  1110 Distal clavicle fracture, displaced without complication.  Images reviewed by me  DG Clavicle Left [EW]    Clinical Course User Index [EW] Daleen Bo, MD     Patient Vitals for the past 24 hrs:  BP Temp Temp src Pulse SpO2 Height Weight  07/13/18 0915 131/71 98.7 F (37.1 C) Oral 74 95 % - -  07/13/18 0913 - - - - -  5' 4"  (1.626 m) 91.2 kg    11:08 AM Reevaluation with update and discussion. After initial assessment and treatment, an updated evaluation reveals he is comfortable now has no further complaints.  Findings discussed with patient and son, all questions answered. Daleen Bo   Medical Decision Making: Accidental fall, rolled out of bed.  Left clavicle fracture without injury to left shoulder articulation.  Head injury without signs of internal bleeding or significant abnormality of cervical spine.  Stable for discharge with outpatient management.  CRITICAL CARE-no Performed by: Daleen Bo   Nursing Notes Reviewed/ Care  Coordinated Applicable Imaging Reviewed Interpretation of Laboratory Data incorporated into ED treatment  The patient appears reasonably screened and/or stabilized for discharge and I doubt any other medical condition or other [facility] requiring further screening, evaluation, or treatment in the ED at this time prior to discharge.  Plan: Home Medications-continue usual medications; Home Treatments-rest, cryotherapy, sling; return here if the recommended treatment, does not improve the symptoms; Recommended follow up-PCP 1 week for checkup and as needed     Final Clinical Impressions(s) / ED Diagnoses   Final diagnoses:  Fall, initial encounter  Closed displaced fracture of acromial end of left clavicle, initial encounter  Injury of head, initial encounter    ED Discharge Orders         Ordered    traMADol (ULTRAM) 50 MG tablet  Every 6 hours PRN     07/13/18 1113           Daleen Bo, MD 07/13/18 1115

## 2018-07-18 ENCOUNTER — Other Ambulatory Visit: Payer: Self-pay | Admitting: Family Medicine

## 2018-07-25 ENCOUNTER — Telehealth: Payer: Self-pay | Admitting: Family Medicine

## 2018-07-25 ENCOUNTER — Other Ambulatory Visit: Payer: Self-pay | Admitting: Family Medicine

## 2018-07-25 NOTE — Telephone Encounter (Signed)
Pt states he fell out of bed 12/25- went to [facility] and xray show collar bone broken.  Wearing a sling x 3 weeks.  Doing well. No redness, no swelling, no heat - area is doing well. appt made for 3 weeks out (08/01/18) with [facility]

## 2018-08-01 ENCOUNTER — Encounter: Payer: Self-pay | Admitting: Family Medicine

## 2018-08-01 ENCOUNTER — Ambulatory Visit (INDEPENDENT_AMBULATORY_CARE_PROVIDER_SITE_OTHER): Payer: Medicare Other | Admitting: Family Medicine

## 2018-08-01 VITALS — BP 112/60 | HR 69 | Temp 98.5°F | Ht 64.0 in | Wt 199.0 lb

## 2018-08-01 DIAGNOSIS — S42002D Fracture of unspecified part of left clavicle, subsequent encounter for fracture with routine healing: Secondary | ICD-10-CM

## 2018-08-01 DIAGNOSIS — S42002A Fracture of unspecified part of left clavicle, initial encounter for closed fracture: Secondary | ICD-10-CM

## 2018-08-01 NOTE — Patient Instructions (Signed)
Continue to wear sling Continue to remember that you cannot lift push or pull anything and he should continue to protect the shoulder for at least a total of 6 weeks. We will have you come back to the office in 3 to 4 weeks and re-x-ray the shoulder and decide where we can go from there as far as the healing process is concerned. Take Tylenol for pain and continue to do gentle motions like taking the shower and gentle movements without picking up anything.

## 2018-08-01 NOTE — Progress Notes (Signed)
Subjective:    Patient ID: George Wang, male    DOB: Jul 11, 1946, 73 y.o.   MRN: [id number]  HPI Patient here today for hospital follow up from [facility] on 07/13/18 where he was seen for a fall and broken left clavicle.  Patient is in for recheck of fracture of left clavicle.  He was seen in the emergency room on December 26 for a fall.  His vital signs are stable and he is not wearing any kind of brace or anything currently.  He says it is not hurting in that much.  He has a host of other medical issues.  He has good mobility and only hurts with certain positions and reaching across his chest.  He should continue to wear his brace as the picture of the fracture was a complete fracture at the distal end of the clavicle.  Patient is alert and feeling well.    Patient Active Problem List   Diagnosis Date Noted  . Murmur, cardiac 04/05/2018  . Dyslipidemia 04/05/2018  . Thoracic aortic atherosclerosis ([place]) 05/09/2017  . Prostate cancer ([place]) 05/09/2017  . Morbid obesity ([place]) 11/21/2015  . Hypertension 02/28/2013  . Hyperlipemia 02/28/2013  . Type 2 diabetes mellitus ([place]) 02/28/2013  . Vitamin D deficiency 02/28/2013  . Overweight(278.02) 10/13/2011  . Frequent PVCs 10/13/2011  . HEMOCCULT POSITIVE STOOL 08/14/2008  . Iron deficiency anemia 06/27/2008  . GOUT 04/25/2008  . ANXIETY 04/25/2008  . Depression 04/25/2008  . DIVERTICULOSIS, COLON 04/25/2008  . IRRITABLE BOWEL SYNDROME 04/25/2008  . NEPHROLITHIASIS 04/25/2008  . PROSTATE CANCER, HX OF 04/25/2008   Outpatient Encounter Medications as of 08/01/2018  Medication Sig  . acetaminophen (TYLENOL) 325 MG tablet Take 650 mg by mouth every 6 (six) hours as needed for mild pain.  Marland Kitchen amLODipine (NORVASC) 10 MG tablet TAKE ONE (1) TABLET EACH DAY  . atorvastatin (LIPITOR) 80 MG tablet TAKE ONE (1) TABLET EACH DAY (Patient taking differently: Take 1/2 tablet by mouth daily)  . benazepril (LOTENSIN) 20 MG tablet Take 1 tablet  (20 mg total) by mouth 2 (two) times daily. (Patient taking differently: Take 40 mg by mouth daily. )  . beta carotene 25000 UNIT capsule Take 25,000 Units by mouth daily.  . blood glucose meter kit and supplies Dispense based on patient and insurance preference. Use up to four times daily as directed. (FOR ICD-10 E10.9, E11.9).  Marland Kitchen Cholecalciferol (VITAMIN D) 2000 UNITS CAPS Take 2 capsules by mouth daily.   . clonazePAM (KLONOPIN) 0.5 MG tablet Take 0.5 tablets (0.25 mg total) by mouth 3 (three) times daily as needed for anxiety. (Patient taking differently: Take 0.25 mg by mouth daily. )  . FeFum-FePo-FA-B Cmp-C-Zn-Mn-Cu (SE-TAN PLUS) 162-115.2-1 MG CAPS TAKE ONE CAPSULE BY MOUTH TWICE A DAY  . Fiber POWD Take 1 Scoop by mouth daily as needed.   . fish oil-omega-3 fatty acids 1000 MG capsule Take 1 g by mouth 2 (two) times daily.   Marland Kitchen glimepiride (AMARYL) 4 MG tablet TAKE 1/2 TABLET TWICE DAILY  . losartan-hydrochlorothiazide (HYZAAR) 100-25 MG tablet TAKE ONE (1) TABLET EACH DAY  . metFORMIN (GLUCOPHAGE) 500 MG tablet TAKE 2 TABLETS EACH MORNING AND 1 TABLETEACH EVENING  . Multiple Vitamins-Minerals (CENTRUM SILVER PO) Take 1 tablet by mouth daily.   Marland Kitchen omeprazole (PRILOSEC) 20 MG capsule TAKE ONE (1) CAPSULE EACH DAY  . PARoxetine (PAXIL) 40 MG tablet TAKE ONE (1) TABLET EACH DAY  . traMADol (ULTRAM) 50 MG tablet Take 1 tablet (50  mg total) by mouth every 6 (six) hours as needed for moderate pain. (Patient not taking: Reported on 08/01/2018)  . [DISCONTINUED] febuxostat (ULORIC) 40 MG tablet Take 80 mg by mouth daily.  . [DISCONTINUED] simvastatin (ZOCOR) 80 MG tablet Take 80 mg by mouth at bedtime.   No facility-administered encounter medications on file as of 08/01/2018.      Review of Systems  Constitutional: Negative.   HENT: Negative.   Eyes: Negative.   Respiratory: Negative.   Cardiovascular: Negative.   Gastrointestinal: Negative.   Endocrine: Negative.   Genitourinary: Negative.    Musculoskeletal: Negative.        Raised area over left clavicle   Skin: Negative.   Allergic/Immunologic: Negative.   Neurological: Negative.   Hematological: Negative.   Psychiatric/Behavioral: Negative.        Objective:   Physical Exam Vitals signs and nursing note reviewed.  Constitutional:      Appearance: Normal appearance. He is well-developed. He is obese. He is not ill-appearing.  HENT:     Head: Normocephalic.  Eyes:     General: No scleral icterus.       Right eye: No discharge.        Left eye: No discharge.     Conjunctiva/sclera: Conjunctivae normal.     Pupils: Pupils are equal, round, and reactive to light.  Neck:     Musculoskeletal: Normal range of motion and neck supple.     Thyroid: No thyromegaly.     Trachea: No tracheal deviation.  Cardiovascular:     Rate and Rhythm: Normal rate and regular rhythm.     Heart sounds: Normal heart sounds. No murmur.  Pulmonary:     Effort: Pulmonary effort is normal. No respiratory distress.     Breath sounds: Normal breath sounds. No wheezing or rales.     Comments: Bruising left anterior chest wall with tenderness to palpation over the distal clavicle area where the fracture was located. Chest:     Chest wall: Tenderness present.  Musculoskeletal:        General: Swelling and tenderness present.     Comments: Limited range of motion of left shoulder with bruising on left chest wall from fracture still apparent  Skin:    General: Skin is warm and dry.     Findings: Bruising present.  Neurological:     General: No focal deficit present.     Mental Status: He is alert and oriented to person, place, and time.     Deep Tendon Reflexes: Reflexes are normal and symmetric.  Psychiatric:        Mood and Affect: Mood normal.        Behavior: Behavior normal.        Thought Content: Thought content normal.        Judgment: Judgment normal.    BP 112/60 (BP Location: Left Arm)   Pulse 69   Temp 98.5 F (36.9 C)  (Oral)   Ht _0  (1.626 m)   Wt 199 lb (90.3 kg)   BMI 34.16 kg/m         Assessment & Plan:  1. Closed displaced fracture of left clavicle with routine healing, unspecified part of clavicle, subsequent encounter -Continue to wear sling and return to the office in 3 to 4 weeks for repeat x-ray and make decision about activity at that time.  Patient Instructions  Continue to wear sling Continue to remember that you cannot lift push or pull anything and he  should continue to protect the shoulder for at least a total of 6 weeks. We will have you come back to the office in 3 to 4 weeks and re-x-ray the shoulder and decide where we can go from there as far as the healing process is concerned. Take Tylenol for pain and continue to do gentle motions like taking the shower and gentle movements without picking up anything.  Arrie Senate MD

## 2018-08-11 ENCOUNTER — Other Ambulatory Visit: Payer: Medicare Other

## 2018-08-11 DIAGNOSIS — Z8546 Personal history of malignant neoplasm of prostate: Secondary | ICD-10-CM | POA: Diagnosis not present

## 2018-08-12 LAB — PSA, TOTAL AND FREE
PSA, Free: <0.02 ng/mL
Prostate Specific Ag, Serum: <0.1 ng/mL (ref 0.0–4.0)

## 2018-08-18 ENCOUNTER — Ambulatory Visit (INDEPENDENT_AMBULATORY_CARE_PROVIDER_SITE_OTHER): Payer: Medicare Other | Admitting: Urology

## 2018-08-18 DIAGNOSIS — N393 Stress incontinence (female) (male): Secondary | ICD-10-CM

## 2018-08-18 DIAGNOSIS — Z8546 Personal history of malignant neoplasm of prostate: Secondary | ICD-10-CM | POA: Diagnosis not present

## 2018-08-18 DIAGNOSIS — Z87442 Personal history of urinary calculi: Secondary | ICD-10-CM

## 2018-08-29 ENCOUNTER — Other Ambulatory Visit: Payer: Self-pay | Admitting: Family Medicine

## 2018-08-29 ENCOUNTER — Other Ambulatory Visit (INDEPENDENT_AMBULATORY_CARE_PROVIDER_SITE_OTHER): Payer: Medicare Other

## 2018-08-29 DIAGNOSIS — S42002A Fracture of unspecified part of left clavicle, initial encounter for closed fracture: Secondary | ICD-10-CM | POA: Diagnosis not present

## 2018-08-29 DIAGNOSIS — S42002D Fracture of unspecified part of left clavicle, subsequent encounter for fracture with routine healing: Secondary | ICD-10-CM | POA: Diagnosis not present

## 2018-08-29 DIAGNOSIS — Z09 Encounter for follow-up examination after completed treatment for conditions other than malignant neoplasm: Secondary | ICD-10-CM

## 2018-08-30 ENCOUNTER — Other Ambulatory Visit: Payer: Self-pay | Admitting: Family Medicine

## 2018-08-30 DIAGNOSIS — Z09 Encounter for follow-up examination after completed treatment for conditions other than malignant neoplasm: Secondary | ICD-10-CM

## 2018-09-13 ENCOUNTER — Other Ambulatory Visit: Payer: Self-pay | Admitting: Family Medicine

## 2018-10-13 ENCOUNTER — Telehealth: Payer: Self-pay | Admitting: Family Medicine

## 2018-10-13 NOTE — Telephone Encounter (Signed)
Pt is requesting refill on Tramadol for back pain Please advise

## 2018-10-13 NOTE — Telephone Encounter (Signed)
Please refill this and put in record for me to sign

## 2018-10-14 ENCOUNTER — Other Ambulatory Visit: Payer: Self-pay | Admitting: Family Medicine

## 2018-10-14 MED ORDER — TRAMADOL HCL 50 MG PO TABS: 50.0000 mg | ORAL_TABLET | Freq: Four times a day (QID) | ORAL | 0 refills | Status: DC | PRN

## 2018-10-31 ENCOUNTER — Other Ambulatory Visit: Payer: Self-pay | Admitting: Family Medicine

## 2018-11-03 ENCOUNTER — Other Ambulatory Visit: Payer: Self-pay

## 2018-11-03 ENCOUNTER — Other Ambulatory Visit: Payer: Medicare Other

## 2018-11-03 DIAGNOSIS — E7800 Pure hypercholesterolemia, unspecified: Secondary | ICD-10-CM | POA: Diagnosis not present

## 2018-11-03 DIAGNOSIS — E559 Vitamin D deficiency, unspecified: Secondary | ICD-10-CM

## 2018-11-03 DIAGNOSIS — E119 Type 2 diabetes mellitus without complications: Secondary | ICD-10-CM

## 2018-11-03 DIAGNOSIS — I10 Essential (primary) hypertension: Secondary | ICD-10-CM

## 2018-11-03 LAB — BAYER DCA HB A1C WAIVED: HB A1C (BAYER DCA - WAIVED): 6.3 % (ref <7.0)

## 2018-11-04 LAB — LIPID PANEL
Chol/HDL Ratio: 2.5 ratio (ref 0.0–5.0)
Cholesterol, Total: 94 mg/dL — ABNORMAL LOW (ref 100–199)
HDL: 38 mg/dL — ABNORMAL LOW
LDL Calculated: 47 mg/dL (ref 0–99)
Triglycerides: 47 mg/dL (ref 0–149)
VLDL Cholesterol Cal: 9 mg/dL (ref 5–40)

## 2018-11-04 LAB — BMP8+EGFR
BUN/Creatinine Ratio: 14 (ref 10–24)
BUN: 14 mg/dL (ref 8–27)
CO2: 26 mmol/L (ref 20–29)
Calcium: 9.3 mg/dL (ref 8.6–10.2)
Chloride: 101 mmol/L (ref 96–106)
Creatinine, Ser: 0.98 mg/dL (ref 0.76–1.27)
GFR calc Af Amer: 87 mL/min/{1.73_m2} (ref >59)
GFR calc non Af Amer: 76 mL/min/{1.73_m2} (ref >59)
Glucose: 94 mg/dL (ref 65–99)
Potassium: 4.8 mmol/L (ref 3.5–5.2)
Sodium: 145 mmol/L — ABNORMAL HIGH (ref 134–144)

## 2018-11-04 LAB — HEPATIC FUNCTION PANEL
ALT: 18 IU/L (ref 0–44)
AST: 37 IU/L (ref 0–40)
Albumin: 4.1 g/dL (ref 3.7–4.7)
Alkaline Phosphatase: 68 IU/L (ref 39–117)
Bilirubin Total: 0.6 mg/dL (ref 0.0–1.2)
Bilirubin, Direct: 0.2 mg/dL (ref 0.00–0.40)
Total Protein: 6.3 g/dL (ref 6.0–8.5)

## 2018-11-04 LAB — CBC WITH DIFFERENTIAL/PLATELET
Basophils Absolute: 0.1 10*3/uL (ref 0.0–0.2)
Basos: 1 %
EOS (ABSOLUTE): 0.2 10*3/uL (ref 0.0–0.4)
Eos: 4 %
Hematocrit: 34 % — ABNORMAL LOW (ref 37.5–51.0)
Hemoglobin: 12 g/dL — ABNORMAL LOW (ref 13.0–17.7)
Immature Grans (Abs): 0 10*3/uL (ref 0.0–0.1)
Immature Granulocytes: 0 %
Lymphocytes Absolute: 1.8 10*3/uL (ref 0.7–3.1)
Lymphs: 29 %
MCH: 34.3 pg — ABNORMAL HIGH (ref 26.6–33.0)
MCHC: 35.3 g/dL (ref 31.5–35.7)
MCV: 97 fL (ref 79–97)
Monocytes Absolute: 0.7 10*3/uL (ref 0.1–0.9)
Monocytes: 10 %
Neutrophils Absolute: 3.6 10*3/uL (ref 1.4–7.0)
Neutrophils: 56 %
Platelets: 239 10*3/uL (ref 150–450)
RBC: 3.5 x10E6/uL — ABNORMAL LOW (ref 4.14–5.80)
RDW: 12 % (ref 11.6–15.4)
WBC: 6.5 10*3/uL (ref 3.4–10.8)

## 2018-11-04 LAB — VITAMIN D 25 HYDROXY (VIT D DEFICIENCY, FRACTURES): Vit D, 25-Hydroxy: 57.7 ng/mL (ref 30.0–100.0)

## 2018-11-07 ENCOUNTER — Telehealth: Payer: Self-pay | Admitting: Family Medicine

## 2018-11-07 ENCOUNTER — Ambulatory Visit (INDEPENDENT_AMBULATORY_CARE_PROVIDER_SITE_OTHER): Payer: Medicare Other | Admitting: Family Medicine

## 2018-11-07 ENCOUNTER — Other Ambulatory Visit: Payer: Self-pay

## 2018-11-07 DIAGNOSIS — D509 Iron deficiency anemia, unspecified: Secondary | ICD-10-CM

## 2018-11-07 DIAGNOSIS — I10 Essential (primary) hypertension: Secondary | ICD-10-CM

## 2018-11-07 DIAGNOSIS — I700 Atherosclerosis of aorta: Secondary | ICD-10-CM | POA: Diagnosis not present

## 2018-11-07 DIAGNOSIS — E559 Vitamin D deficiency, unspecified: Secondary | ICD-10-CM

## 2018-11-07 DIAGNOSIS — C61 Malignant neoplasm of prostate: Secondary | ICD-10-CM

## 2018-11-07 DIAGNOSIS — F418 Other specified anxiety disorders: Secondary | ICD-10-CM | POA: Diagnosis not present

## 2018-11-07 DIAGNOSIS — E119 Type 2 diabetes mellitus without complications: Secondary | ICD-10-CM | POA: Diagnosis not present

## 2018-11-07 DIAGNOSIS — S42002D Fracture of unspecified part of left clavicle, subsequent encounter for fracture with routine healing: Secondary | ICD-10-CM

## 2018-11-07 NOTE — Patient Instructions (Signed)
Continue with iron replacement Continue with diabetic medication but check blood sugars more closely with weight loss.  He may find it necessary to reduce his Amaryl from one half twice a day to one half daily if his blood sugars continue to run lower.  He will stay on the Metformin 1 twice a day. Continue with as aggressive therapeutic lifestyle changes as possible to achieve weight loss through diet and exercise Continue with good respiratory and hand hygiene Continue to wear mask and wash hands regularly Avoid crowds of people

## 2018-11-07 NOTE — Progress Notes (Signed)
Virtual Visit Via telephone Note I connected with@ on 11/07/18 by telephone and verified that I am speaking with the correct person or authorized healthcare agent using two identifiers. George Wang is currently located at home and there are no unauthorized people in close proximity. I completed this visit while in a private location in my home .  I connected to the patient by telephone and verified that I was speaking with the correct person.  This visit type was conducted due to national recommendations for restrictions regarding the COVID-19 Pandemic (e.g. social distancing).  This format is felt to be most appropriate for this patient at this time.  All issues noted in this document were discussed and addressed.  No physical exam was performed.    I discussed the limitations, risks, security and privacy concerns of performing an evaluation and management service by telephone and the availability of in person appointments. I also discussed with the patient that there may be a patient responsible charge related to this service. The patient expressed understanding and agreed to proceed.   Date:  11/07/2018    ID:  George Wang      29-Oct-1945        [id number]   Patient Care Team Patient Care Team: Chipper Herb, MD as PCP - General (Family Medicine) Druscilla Brownie, MD (Dermatology) Irine Seal, MD (Urology) Minus Breeding, MD (Cardiology) Pyrtle, Lajuan Lines, MD as Consulting Physician (Gastroenterology)  Reason for Visit: Primary Care Follow-up     History of Present Illness & Review of Systems:     George Wang is a 73 y.o. year old male primary care patient that presents today for a telehealth visit.  I have been following this patient for many years.  He has a history of morbid obesity because of elevated BMI and 2 or more comorbid health conditions.  He has diabetes mellitus hypertension hyperlipidemia and has had prostate cancer.  He has had recent blood work done  and these results were again reviewed today before the telephone visit.  He has been notified of all of these results.  In fact they were the best that they have been in a good while including a hemoglobin A1c of 6.3%.  His creatinine and potassium are good.  His hemoglobin always runs slightly decreased and was stable at 12.0 for him with a normal platelet and white count.  The LDL-C cholesterol was excellent at 47 triglycerides were good at 47.  The good cholesterol was slightly decreased at 38 consistent with past readings.  The vitamin D level was good at 57.7 and all liver function tests were normal.  The patient sees the urologist, Dr. Irine Seal on a regular basis and he was sent a copy of this blood work.  His last PSA was in January of this year and it was less than 0.1 and it has been that way for several years.  His last colonoscopy was in December 2019.  Patient did have polyps on the colonoscopy and had severe diverticulosis.  Based on these findings a repeat colonoscopy was recommended.  He is also followed by Dr. Percival Spanish and he had an echocardiogram in September 2019.  The patient today denies any chest pain pressure tightness or shortness of breath.  He denies any trouble with swallowing heartburn indigestion nausea vomiting diarrhea blood in the stool or black tarry bowel movements.  He did have a colonoscopy and was told that he would need a repeat  colonoscopy 3 years later.  This was done in December 2019 by Dr. Elmo Putt.  The patient is passing his water well with no complaints.  He sees Dr. Jeffie Pollock in January yearly.  Review of systems as stated otherwise negative for body systems left unmentioned.   The patient does not have symptoms concerning for COVID-19 infection (fever, chills, cough, or new shortness of breath).      Current Medications (Verified) Allergies as of 11/07/2018   No Known Allergies     Medication List       Accurate as of November 07, 2018 10:21 AM. Always use your  most recent med list.        acetaminophen 325 MG tablet Commonly known as:  TYLENOL Take 650 mg by mouth every 6 (six) hours as needed for mild pain.   amLODipine 10 MG tablet Commonly known as:  NORVASC TAKE ONE (1) TABLET EACH DAY   atorvastatin 80 MG tablet Commonly known as:  LIPITOR TAKE ONE (1) TABLET EACH DAY   benazepril 20 MG tablet Commonly known as:  LOTENSIN Take 1 tablet (20 mg total) by mouth 2 (two) times daily.   beta carotene 25000 UNIT capsule Take 25,000 Units by mouth daily.   blood glucose meter kit and supplies Dispense based on patient and insurance preference. Use up to four times daily as directed. (FOR ICD-10 E10.9, E11.9).   CENTRUM SILVER PO Take 1 tablet by mouth daily.   clonazePAM 0.5 MG tablet Commonly known as:  KLONOPIN Take 0.5 tablets (0.25 mg total) by mouth 3 (three) times daily as needed for anxiety.   Fiber Powd Take 1 Scoop by mouth daily as needed.   fish oil-omega-3 fatty acids 1000 MG capsule Take 1 g by mouth 2 (two) times daily.   glimepiride 4 MG tablet Commonly known as:  AMARYL TAKE 1/2 TABLET TWICE DAILY   losartan-hydrochlorothiazide 100-25 MG tablet Commonly known as:  HYZAAR TAKE ONE (1) TABLET EACH DAY   metFORMIN 500 MG tablet Commonly known as:  GLUCOPHAGE TAKE 2 TABLETS EACH MORNING AND 1 TABLETEACH EVENING   omeprazole 20 MG capsule Commonly known as:  PRILOSEC TAKE ONE (1) CAPSULE EACH DAY   PARoxetine 40 MG tablet Commonly known as:  PAXIL TAKE ONE (1) TABLET EACH DAY   Se-Tan PLUS 162-115.2-1 MG Caps TAKE ONE CAPSULE BY MOUTH TWICE A DAY   traMADol 50 MG tablet Commonly known as:  ULTRAM Take 1 tablet (50 mg total) by mouth every 6 (six) hours as needed for moderate pain.   Vitamin D 50 MCG (2000 UT) Caps Take 2 capsules by mouth daily.           Allergies (Verified)    Patient has no known allergies.  Past Medical History Past Medical History:  Diagnosis Date   Allergy     mild   Anemia    Iron deficiency   Anxiety state, unspecified    Aortic atherosclerosis (HCC)    Arthritis    Calculus of kidney    kidney stones  - normal BMP    Cancer (HCC)    Prostate, skin cancer x 2    Cataract    forming both eyes   Depressive disorder, not elsewhere classified    Diverticulosis of colon (without mention of hemorrhage)    GERD (gastroesophageal reflux disease)    on nexium   Gout, unspecified    Heart murmur    Hyperlipidemia    Hypertension, essential  Irritable bowel syndrome    Neuromuscular disorder (HCC)    numbness in knees from back issues    Personal history of malignant neoplasm of prostate    PVC (premature ventricular contraction)    Type II or unspecified type diabetes mellitus without mention of complication, not stated as uncontrolled      Past Surgical History:  Procedure Laterality Date   COLONOSCOPY     HERNIA REPAIR     left side   PROSTATE SURGERY     skin cancer removed     chest x 2 3-4 yrs ago    TONSILLECTOMY      Social History   Socioeconomic History   Marital status: Married    Spouse name: Not on file   Number of children: 2   Years of education: 13   Highest education level: Some college, no degree  Occupational History   Occupation: Production designer, theatre/television/film    Comment: Part time  Scientist, product/process development strain: Not hard at all   Food insecurity:    Worry: Never true    Inability: Never true   Transportation needs:    Medical: No    Non-medical: No  Tobacco Use   Smoking status: Former Smoker    Packs/day: 1.00    Years: 19.00    Pack years: 19.00    Types: Cigarettes    Last attempt to quit: 10/13/1978    Years since quitting: 40.0   Smokeless tobacco: Former Systems developer    Types: Snuff    Quit date: 05/30/2008  Substance and Sexual Activity   Alcohol use: Yes    Comment: social    Drug use: No   Sexual activity: Yes  Lifestyle   Physical  activity:    Days per week: 0 days    Minutes per session: 0 min   Stress: To some extent  Relationships   Social connections:    Talks on phone: Three times a week    Gets together: More than three times a week    Attends religious service: Never    Active member of club or organization: No    Attends meetings of clubs or organizations: Never    Relationship status: Married  Other Topics Concern   Not on file  Social History Narrative   Lives at home with wife.  He has one son and a daughter that died in a tornado in 36. 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 He has one living grandson and one grandson that passed away in Sep 18, 2016 at age 26 from cancer.     Family History  Problem Relation Age of Onset   Cancer Maternal Aunt        breast   Pancreatic cancer Paternal Aunt    Prostate cancer Paternal Uncle    Heart disease Maternal Grandmother 56   Heart disease Maternal Grandfather        fluid / CHF    Heart disease Paternal Grandfather 48   Heart attack Paternal Grandfather        passed at 10   Prostate cancer Father    Coronary artery disease Father 60       CABG   Hypertension Mother    Hyperlipidemia Mother    Other Daughter        accident with tornado 09-18-96   Hypertension Son    Colon cancer Neg Hx    Colon polyps Neg Hx    Esophageal cancer  Neg Hx    Rectal cancer Neg Hx    Stomach cancer Neg Hx       Labs/Other Tests and Data Reviewed:    Wt Readings from Last 3 Encounters:  08/01/18 199 lb (90.3 kg)  07/13/18 201 lb (91.2 kg)  07/03/18 201 lb (91.2 kg)   Temp Readings from Last 3 Encounters:  08/01/18 98.5 F (36.9 C) (Oral)  07/13/18 97.9 F (36.6 C) (Oral)  07/03/18 (!) 97.3 F (36.3 C) (Oral)   BP Readings from Last 3 Encounters:  08/01/18 112/60  07/13/18 122/68  07/03/18 121/67   Pulse Readings from Last 3 Encounters:  08/01/18 69  07/13/18 75  07/03/18 70     Lab Results  Component Value Date    HGBA1C 6.3 11/03/2018   HGBA1C 7.1 (H) 07/03/2018   HGBA1C 6.9 02/14/2018   Lab Results  Component Value Date   MICROALBUR 20 10/22/2014   LDLCALC 47 11/03/2018   CREATININE 0.98 11/03/2018       Chemistry      Component Value Date/Time   NA 145 (H) 11/03/2018 1515   K 4.8 11/03/2018 1515   CL 101 11/03/2018 1515   CO2 26 11/03/2018 1515   BUN 14 11/03/2018 1515   CREATININE 0.98 11/03/2018 1515   CREATININE 1.11 11/15/2012 1108      Component Value Date/Time   CALCIUM 9.3 11/03/2018 1515   ALKPHOS 68 11/03/2018 1515   AST 37 11/03/2018 1515   ALT 18 11/03/2018 1515   BILITOT 0.6 11/03/2018 1515         OBSERVATIONS/ OBJECTIVE:     Patient was alert and related his history well to me.  He is more active but still try to watch his diet closely.  His weight is currently 185 pounds.  His blood sugars fasting are running about 72.  His blood pressures generally run in the low 120s over the 60-70 range.  This morning his blood pressure was 141/68.  He was reminded to watch his sugars more closely and that he may need to reduce his Amaryl to 1/2 pill daily.  Physical exam deferred due to nature of telephonic visit.  ASSESSMENT & PLAN    Time:   Today, I have spent 21 minutes with the patient via telephone discussing the above including Covid precautions.     Visit Diagnoses: 1. Closed displaced fracture of left clavicle with routine healing, unspecified part of clavicle, subsequent encounter -This is much improved.  As a consequence of him staying in because of the clavicle fracture he did watch his diet more closely and lost according to him from 198 down to 185.  The good news about that was that all of his numbers were much better including his blood sugar his A1c and his cholesterol.  I told him he should continue with his diet efforts and hopefully we will see is good numbers at the next blood check.  2. Essential hypertension -His blood pressures have been good at  home and he will continue with current treatment  3. Type 2 diabetes mellitus without complication, without long-term current use of insulin (HCC) -The hemoglobin A1c was good and he is aware of this and he will continue with current treatment and it was improved from previously in the 6 range this time.  4. Prostate cancer ([place]) -The patient's PSA remains low and he is seeing Dr. Jeffie Pollock and will see him again yearly.  5. Thoracic aortic atherosclerosis ([place]) -Continue with aggressive therapeutic  lifestyle changes for blood sugar control and cholesterol control and all efforts to lose weight through diet and exercise  6. Vitamin D deficiency -Vitamin D level was good he will continue with current treatment  7. Anxiety with depression -The patient is no longer taking clonazepam.  He is weaned himself off of that.  He is still taking the Paxil.  He is feeling well.  8. Morbid obesity ([place]) -The patient has lost weight and is now 185 pounds and he was applauded for this.  9. Iron deficiency anemia, unspecified iron deficiency anemia type -His hemoglobin was 12 and he will continue to take his iron replacement     The above assessment and management plan was discussed with the patient. The patient verbalized understanding of and has agreed to the management plan. Patient is aware to call the clinic if symptoms persist or worsen. Patient is aware when to return to the clinic for a follow-up visit. Patient educated on when it is appropriate to go to the emergency department.    Chipper Herb, MD [address], [place], [place] [address] Ph [phone]   Arrie Senate MD

## 2018-11-07 NOTE — Telephone Encounter (Signed)
Aware of all results.

## 2018-11-21 ENCOUNTER — Other Ambulatory Visit: Payer: Self-pay | Admitting: Family Medicine

## 2018-12-04 ENCOUNTER — Telehealth: Payer: Self-pay | Admitting: Family Medicine

## 2018-12-04 NOTE — Telephone Encounter (Signed)
appt for TELE given with [facility] tomorrow

## 2018-12-04 NOTE — Telephone Encounter (Signed)
For review

## 2018-12-04 NOTE — Telephone Encounter (Signed)
PT is wanting to talk to [facility] about his BP and Pulse, his pulse has running low BP this morning was 175/42 and his pulse was around 44. Pt wants to know if he needs to have a televisit with Dr Laurance Flatten or what she suggest.

## 2018-12-05 ENCOUNTER — Other Ambulatory Visit: Payer: Self-pay

## 2018-12-05 ENCOUNTER — Ambulatory Visit (INDEPENDENT_AMBULATORY_CARE_PROVIDER_SITE_OTHER): Payer: Medicare Other | Admitting: *Deleted

## 2018-12-05 ENCOUNTER — Other Ambulatory Visit: Payer: Self-pay | Admitting: Family Medicine

## 2018-12-05 ENCOUNTER — Ambulatory Visit (INDEPENDENT_AMBULATORY_CARE_PROVIDER_SITE_OTHER): Payer: Medicare Other | Admitting: Family Medicine

## 2018-12-05 ENCOUNTER — Encounter: Payer: Self-pay | Admitting: Family Medicine

## 2018-12-05 DIAGNOSIS — E782 Mixed hyperlipidemia: Secondary | ICD-10-CM

## 2018-12-05 DIAGNOSIS — I10 Essential (primary) hypertension: Secondary | ICD-10-CM

## 2018-12-05 DIAGNOSIS — I700 Atherosclerosis of aorta: Secondary | ICD-10-CM | POA: Diagnosis not present

## 2018-12-05 DIAGNOSIS — I493 Ventricular premature depolarization: Secondary | ICD-10-CM

## 2018-12-05 DIAGNOSIS — E119 Type 2 diabetes mellitus without complications: Secondary | ICD-10-CM | POA: Diagnosis not present

## 2018-12-05 NOTE — Addendum Note (Signed)
Addended by: Zannie Cove on: 12/05/2018 09:05 AM   Modules accepted: Orders

## 2018-12-05 NOTE — Progress Notes (Signed)
Virtual Visit Via telephone Note I connected with@ on 12/05/18 by telephone and verified that I am speaking with the correct person or authorized healthcare agent using two identifiers. George Wang is currently located at home and there are no unauthorized people in close proximity. I completed this visit while in a private location in my home .  This visit type was conducted due to national recommendations for restrictions regarding the COVID-19 Pandemic (e.g. social distancing).  This format is felt to be most appropriate for this patient at this time.  All issues noted in this document were discussed and addressed.  No physical exam was performed.    I discussed the limitations, risks, security and privacy concerns of performing an evaluation and management service by telephone and the availability of in person appointments. I also discussed with the patient that there may be a patient responsible charge related to this service. The patient expressed understanding and agreed to proceed.   Date:  12/05/2018    ID:  George Wang      04/22/1946        [id number]   Patient Care Team Patient Care Team: Chipper Herb, MD as PCP - General (Family Medicine) Druscilla Brownie, MD (Dermatology) Irine Seal, MD (Urology) Minus Breeding, MD (Cardiology) Pyrtle, Lajuan Lines, MD as Consulting Physician (Gastroenterology)  Reason for Visit: Primary Care Follow-up     History of Present Illness & Review of Systems:     George Wang is a 73 y.o. year old male primary care patient that presents today for a telehealth visit.  The patient is feeling well.  He has been checking his blood pressures and pulse rates regularly and has had no symptoms regarding low pulse rates in the 30s and 40s at times.  He has had no symptoms as far as the blood pressure being lower at times.  He denies any chest pain pressure tightness or shortness of breath.  He denies any trouble with his stomach including  nausea vomiting diarrhea or blood in the stool.  He is passing his water without problems.  There is been no change in allergies and no change in medications.  He only takes his lorazepam if needed.  He would like to reduce his Paxil and will start doing this gradually.  That has not started yet.  Review of systems as stated, otherwise negative.  The patient does not have symptoms concerning for COVID-19 infection (fever, chills, cough, or new shortness of breath).      Current Medications (Verified) Allergies as of 12/05/2018   No Known Allergies     Medication List       Accurate as of Dec 05, 2018  7:47 AM. If you have any questions, ask your nurse or doctor.        acetaminophen 325 MG tablet Commonly known as:  TYLENOL Take 650 mg by mouth every 6 (six) hours as needed for mild pain.   amLODipine 10 MG tablet Commonly known as:  NORVASC TAKE ONE (1) TABLET EACH DAY   atorvastatin 80 MG tablet Commonly known as:  LIPITOR TAKE ONE (1) TABLET EACH DAY   benazepril 20 MG tablet Commonly known as:  LOTENSIN Take 1 tablet (20 mg total) by mouth 2 (two) times daily. What changed:    how much to take  when to take this   beta carotene 25000 UNIT capsule Take 25,000 Units by mouth daily.   blood glucose meter kit  and supplies Dispense based on patient and insurance preference. Use up to four times daily as directed. (FOR ICD-10 E10.9, E11.9).   CENTRUM SILVER PO Take 1 tablet by mouth daily.   Fiber Powd Take 1 Scoop by mouth daily as needed.   fish oil-omega-3 fatty acids 1000 MG capsule Take 1 g by mouth 2 (two) times daily.   glimepiride 4 MG tablet Commonly known as:  AMARYL TAKE 1/2 TABLET TWICE DAILY   losartan-hydrochlorothiazide 100-25 MG tablet Commonly known as:  HYZAAR TAKE ONE (1) TABLET EACH DAY   metFORMIN 500 MG tablet Commonly known as:  GLUCOPHAGE TAKE 2 TABLETS EACH MORNING AND 1 TABLETEACH EVENING   omeprazole 20 MG capsule Commonly  known as:  PRILOSEC TAKE ONE (1) CAPSULE EACH DAY   ONE TOUCH ULTRA TEST test strip Generic drug:  glucose blood USE UP TO FOUR TIMES A DAY AS DIRECTED   PARoxetine 40 MG tablet Commonly known as:  PAXIL TAKE ONE (1) TABLET EACH DAY   Se-Tan PLUS 162-115.2-1 MG Caps TAKE ONE CAPSULE BY MOUTH TWICE A DAY   traMADol 50 MG tablet Commonly known as:  ULTRAM Take 1 tablet (50 mg total) by mouth every 6 (six) hours as needed for moderate pain.   Vitamin D 50 MCG (2000 UT) Caps Take 2 capsules by mouth daily.           Allergies (Verified)    Patient has no known allergies.  Past Medical History Past Medical History:  Diagnosis Date   Allergy    mild   Anemia    Iron deficiency   Anxiety state, unspecified    Aortic atherosclerosis (HCC)    Arthritis    Calculus of kidney    kidney stones  - normal BMP    Cancer (HCC)    Prostate, skin cancer x 2    Cataract    forming both eyes   Depressive disorder, not elsewhere classified    Diverticulosis of colon (without mention of hemorrhage)    GERD (gastroesophageal reflux disease)    on nexium   Gout, unspecified    Heart murmur    Hyperlipidemia    Hypertension, essential    Irritable bowel syndrome    Neuromuscular disorder (HCC)    numbness in knees from back issues    Personal history of malignant neoplasm of prostate    PVC (premature ventricular contraction)    Type II or unspecified type diabetes mellitus without mention of complication, not stated as uncontrolled      Past Surgical History:  Procedure Laterality Date   COLONOSCOPY     HERNIA REPAIR     left side   PROSTATE SURGERY     skin cancer removed     chest x 2 3-4 yrs ago    TONSILLECTOMY      Social History   Socioeconomic History   Marital status: Married    Spouse name: Not on file   Number of children: 2   Years of education: 13   Highest education level: Some college, no degree  Occupational History     Occupation: Production designer, theatre/television/film    Comment: Part time  Scientist, product/process development strain: Not hard at all   Food insecurity:    Worry: Never true    Inability: Never true   Transportation needs:    Medical: No    Non-medical: No  Tobacco Use   Smoking status: Former Smoker    Packs/day: 1.00  Years: 19.00    Pack years: 19.00    Types: Cigarettes    Last attempt to quit: 10/13/1978    Years since quitting: 40.1   Smokeless tobacco: Former Systems developer    Types: Snuff    Quit date: 05/30/2008  Substance and Sexual Activity   Alcohol use: Yes    Comment: social    Drug use: No   Sexual activity: Yes  Lifestyle   Physical activity:    Days per week: 0 days    Minutes per session: 0 min   Stress: To some extent  Relationships   Social connections:    Talks on phone: Three times a week    Gets together: More than three times a week    Attends religious service: Never    Active member of club or organization: No    Attends meetings of clubs or organizations: Never    Relationship status: Married  Other Topics Concern   Not on file  Social History Narrative   Lives at home with wife.  He has one son and a daughter that died in a tornado in 19. 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 He has one living grandson and one grandson that passed away in 04-Oct-2016 at age 75 from cancer.     Family History  Problem Relation Age of Onset   Cancer Maternal Aunt        breast   Pancreatic cancer Paternal Aunt    Prostate cancer Paternal Uncle    Heart disease Maternal Grandmother 87   Heart disease Maternal Grandfather        fluid / CHF    Heart disease Paternal Grandfather 30   Heart attack Paternal Grandfather        passed at 74   Prostate cancer Father    Coronary artery disease Father 32       CABG   Hypertension Mother    Hyperlipidemia Mother    Other Daughter        accident with tornado October 04, 1996   Hypertension Son     Colon cancer Neg Hx    Colon polyps Neg Hx    Esophageal cancer Neg Hx    Rectal cancer Neg Hx    Stomach cancer Neg Hx       Labs/Other Tests and Data Reviewed:    Wt Readings from Last 3 Encounters:  08/01/18 199 lb (90.3 kg)  07/13/18 201 lb (91.2 kg)  07/03/18 201 lb (91.2 kg)   Temp Readings from Last 3 Encounters:  08/01/18 98.5 F (36.9 C) (Oral)  07/13/18 97.9 F (36.6 C) (Oral)  07/03/18 (!) 97.3 F (36.3 C) (Oral)   BP Readings from Last 3 Encounters:  08/01/18 112/60  07/13/18 122/68  07/03/18 121/67   Pulse Readings from Last 3 Encounters:  08/01/18 69  07/13/18 75  07/03/18 70     Lab Results  Component Value Date   HGBA1C 6.3 11/03/2018   HGBA1C 7.1 (H) 07/03/2018   HGBA1C 6.9 02/14/2018   Lab Results  Component Value Date   MICROALBUR 20 10/22/2014   LDLCALC 47 11/03/2018   CREATININE 0.98 11/03/2018       Chemistry      Component Value Date/Time   NA 145 (H) 11/03/2018 1515   K 4.8 11/03/2018 1515   CL 101 11/03/2018 1515   CO2 26 11/03/2018 1515   BUN 14 11/03/2018 1515   CREATININE 0.98 11/03/2018 1515   CREATININE 1.11  11/15/2012 1108      Component Value Date/Time   CALCIUM 9.3 11/03/2018 1515   ALKPHOS 68 11/03/2018 1515   AST 37 11/03/2018 1515   ALT 18 11/03/2018 1515   BILITOT 0.6 11/03/2018 1515         OBSERVATIONS/ OBJECTIVE:     The patient's blood pressure this morning was 138/67.  Pulse rate was 60 this morning.  He has over the past couple weeks had pulse rates in the 30s and 40s at times without symptoms.  He saw Dr. Percival Spanish in the fall and was not to see him again until this fall.  His recent lab work was reviewed by me and everything was basically stable he always runs a slightly decreased hemoglobin and is on iron replacement.  One thing that I noticed today was that he is on an ACE and angiotensin receptor blocker and we will have the cardiologist look at this when he sees him.  Based on the complaints  without symptoms we will have him come to the office to get an EKG and hooking him up with a Edison Pace of Hearts monitor and also get a thyroid profile on him.  Physical exam deferred due to nature of telephonic visit.  ASSESSMENT & PLAN    Time:   Today, I have spent 20 minutes with the patient via telephone discussing the above including Covid precautions.     Visit Diagnoses: 1. Essential hypertension -Blood pressure is good today.  2. Thoracic aortic atherosclerosis ([facility]) -Continue with aggressive therapeutic lifestyle changes  3. Type 2 diabetes mellitus without complication, without long-term current use of insulin (HCC) -Continue with all efforts to control blood sugar with weight loss through exercise and diet  4. Morbid obesity ([facility]) -Continue aggressive therapeutic lifestyle changes  5. Mixed hyperlipidemia -Continue with statin therapy and therapeutic lifestyle changes  Patient Instructions  We would ask you to come to the office get an EKG, thyroid profile and be hooked up for a Edison Pace of Hearts monitor We would asked the nurse to check the blood pressure and pulse rate when he comes in to get this done Would also arrange for him to see the cardiologist in the next 3 to 4 weeks or sooner if symptoms occur with the low pulse rates.       The above assessment and management plan was discussed with the patient. The patient verbalized understanding of and has agreed to the management plan. Patient is aware to call the clinic if symptoms persist or worsen. Patient is aware when to return to the clinic for a follow-up visit. Patient educated on when it is appropriate to go to the emergency department.    Chipper Herb, MD [address], [place], [place] [address] Ph [phone]   Arrie Senate MD

## 2018-12-05 NOTE — Patient Instructions (Signed)
We would ask you to come to the office get an EKG, thyroid profile and be hooked up for a Edison Pace of Hearts monitor We would asked the nurse to check the blood pressure and pulse rate when he comes in to get this done Would also arrange for him to see the cardiologist in the next 3 to 4 weeks or sooner if symptoms occur with the low pulse rates.

## 2018-12-05 NOTE — Addendum Note (Signed)
Addended by: Zannie Cove on: 12/05/2018 09:44 AM   Modules accepted: Orders

## 2018-12-06 DIAGNOSIS — R002 Palpitations: Secondary | ICD-10-CM | POA: Diagnosis not present

## 2018-12-06 LAB — THYROID PANEL WITH TSH
Free Thyroxine Index: 1.8 (ref 1.2–4.9)
T3 Uptake Ratio: 24 % (ref 24–39)
T4, Total: 7.7 ug/dL (ref 4.5–12.0)
TSH: 1.33 u[IU]/mL (ref 0.450–4.500)

## 2018-12-07 ENCOUNTER — Telehealth: Payer: Self-pay | Admitting: Cardiology

## 2018-12-07 ENCOUNTER — Other Ambulatory Visit: Payer: Self-pay | Admitting: *Deleted

## 2018-12-07 DIAGNOSIS — I493 Ventricular premature depolarization: Secondary | ICD-10-CM

## 2018-12-07 NOTE — Progress Notes (Signed)
24hour Heart Monitor Scanned into patient's chart

## 2018-12-07 NOTE — Telephone Encounter (Signed)
Virtual Visit Pre-Appointment Phone Call  "(Name), I am calling you today to discuss your upcoming appointment. We are currently trying to limit exposure to the virus that causes COVID-19 by seeing patients at home rather than in the office."  1. "What is the BEST phone number to call the day of the visit?" - include this in appointment notes  2. Do you have or have access to (through a family member/friend) a smartphone with video capability that we can use for your visit?" a. If yes - list this number in appt notes as cell (if different from BEST phone #) and list the appointment type as a VIDEO visit in appointment notes b. If no - list the appointment type as a PHONE visit in appointment notes  3. Confirm consent - "In the setting of the current Covid19 crisis, you are scheduled for a (phone or video) visit with your provider on (date) at (time).  Just as we do with many in-office visits, in order for you to participate in this visit, we must obtain consent.  If you'd like, I can send this to your mychart (if signed up) or email for you to review.  Otherwise, I can obtain your verbal consent now.  All virtual visits are billed to your insurance company just like a normal visit would be.  By agreeing to a virtual visit, we'd like you to understand that the technology does not allow for your provider to perform an examination, and thus may limit your provider's ability to fully assess your condition. If your provider identifies any concerns that need to be evaluated in person, we will make arrangements to do so.  Finally, though the technology is pretty good, we cannot assure that it will always work on either your or our end, and in the setting of a video visit, we may have to convert it to a phone-only visit.  In either situation, we cannot ensure that we have a secure connection.  Are you willing to proceed?" STAFF: Did the patient verbally acknowledge consent to telehealth visit? Document  YES/NO here: YES  4. Advise patient to be prepared - "Two hours prior to your appointment, go ahead and check your blood pressure, pulse, oxygen saturation, and your weight (if you have the equipment to check those) and write them all down. When your visit starts, your provider will ask you for this information. If you have an Apple Watch or Kardia device, please plan to have heart rate information ready on the day of your appointment. Please have a pen and paper handy nearby the day of the visit as well."  5. Give patient instructions for MyChart download to smartphone OR [email or web address] as below if video visit (depending on what platform provider is using)  6. Inform patient they will receive a phone call 15 minutes prior to their appointment time (may be from unknown caller ID) so they should be prepared to answer    TELEPHONE CALL NOTE  George Wang has been deemed a candidate for a follow-up tele-health visit to limit community exposure during the Covid-19 pandemic. I spoke with the patient via phone to ensure availability of phone/video source, confirm preferred email & phone number, and discuss instructions and expectations.  I reminded George Wang to be prepared with any vital sign and/or heart rhythm information that could potentially be obtained via home monitoring, at the time of his visit. I reminded George Wang to expect a phone call prior to  his visit.  [name] 12/07/2018 11:23 AM   INSTRUCTIONS FOR DOWNLOADING THE MYCHART APP TO SMARTPHONE  - The patient must first make sure to have activated MyChart and know their login information - If Apple, go to [employer] and type in MyChart in the search bar and download the app. If Android, ask patient to go to [employer] and type in [facility] in the search bar and download the app. The app is free but as with any other app downloads, their phone may require them to verify saved payment information or  Apple/Android password.  - The patient will need to then log into the app with their MyChart username and password, and select [facility] as their healthcare provider to link the account. When it is time for your visit, go to the MyChart app, find appointments, and click Begin Video Visit. Be sure to Select Allow for your device to access the Microphone and Camera for your visit. You will then be connected, and your provider will be with you shortly.  **If they have any issues connecting, or need assistance please contact MyChart service desk [phone])**  **If using a computer, in order to ensure the best quality for their visit they will need to use either of the following Internet Browsers: [employer], or Google Chrome**  IF USING DOXIMITY or DOXY.ME - The patient will receive a link just prior to their visit by text.     FULL LENGTH CONSENT FOR TELE-HEALTH VISIT   I hereby voluntarily request, consent and authorize [facility] and its employed or contracted physicians, physician assistants, nurse practitioners or other licensed health care professionals (the Practitioner), to provide me with telemedicine health care services (the Services") as deemed necessary by the treating Practitioner. I acknowledge and consent to receive the Services by the Practitioner via telemedicine. I understand that the telemedicine visit will involve communicating with the Practitioner through live audiovisual communication technology and the disclosure of certain medical information by electronic transmission. I acknowledge that I have been given the opportunity to request an in-person assessment or other available alternative prior to the telemedicine visit and am voluntarily participating in the telemedicine visit.  I understand that I have the right to withhold or withdraw my consent to the use of telemedicine in the course of my care at any time, without affecting my right to future care  or treatment, and that the Practitioner or I may terminate the telemedicine visit at any time. I understand that I have the right to inspect all information obtained and/or recorded in the course of the telemedicine visit and may receive copies of available information for a reasonable fee.  I understand that some of the potential risks of receiving the Services via telemedicine include:   Delay or interruption in medical evaluation due to technological equipment failure or disruption;  Information transmitted may not be sufficient (e.g. poor resolution of images) to allow for appropriate medical decision making by the Practitioner; and/or   In rare instances, security protocols could fail, causing a breach of personal health information.  Furthermore, I acknowledge that it is my responsibility to provide information about my medical history, conditions and care that is complete and accurate to the best of my ability. I acknowledge that Practitioner's advice, recommendations, and/or decision may be based on factors not within their control, such as incomplete or inaccurate data provided by me or distortions of diagnostic images or specimens that may result from electronic transmissions. I  understand that the practice of medicine is not an exact science and that Practitioner makes no warranties or guarantees regarding treatment outcomes. I acknowledge that I will receive a copy of this consent concurrently upon execution via email to the email address I last provided but may also request a printed copy by calling the office of [facility].    I understand that my insurance will be billed for this visit.   I have read or had this consent read to me.  I understand the contents of this consent, which adequately explains the benefits and risks of the Services being provided via telemedicine.   I have been provided ample opportunity to ask questions regarding this consent and the Services and have had  my questions answered to my satisfaction.  I give my informed consent for the services to be provided through the use of telemedicine in my medical care  By participating in this telemedicine visit I agree to the above.

## 2018-12-07 NOTE — Progress Notes (Signed)
2

## 2018-12-07 NOTE — Telephone Encounter (Signed)
[phone] consent/ my chart/ pre reg completed

## 2018-12-11 NOTE — Progress Notes (Signed)
Virtual Visit via Telephone Note   This visit type was conducted due to national recommendations for restrictions regarding the COVID-19 Pandemic (e.g. social distancing) in an effort to limit this patient's exposure and mitigate transmission in our community.  Due to his co-morbid illnesses, this patient is at least at moderate risk for complications without adequate follow up.  This format is felt to be most appropriate for this patient at this time.  The patient did not have access to video technology/had technical difficulties with video requiring transitioning to audio format only (telephone).  All issues noted in this document were discussed and addressed.  No physical exam could be performed with this format.  Please refer to the patient's chart for his  consent to telehealth for [facility].   Date:  12/11/2018   ID:  George Wang, George Wang 1945/09/18, MRN [id number]  Patient Location: Home Provider Location: Home  PCP:  Chipper Herb, MD  Cardiologist:  Minus Breeding, MD  Electrophysiologist:  None   Evaluation Performed:  Follow-Up Visit  Chief Complaint:  Bradycardia  History of Present Illness:    George Wang is a 73 y.o. male who presents for evaluation of PVCs.    I saw him in 2016 for evaluation of PVCs.  He had a negative POET (Plain Old Exercise Treadmill).  He had a distant echo that was also normal and was done to evaluate PVCs.    He is doing well since I last saw him.  He does report that he has low heart rates recorded.  He says sometimes it is in the low 40s.  However, he does not feel any presyncope or syncope.  He knows that his heart rate is irregular and he feels that rarely.  He does not correlate the irregular rhythms with a slow heart rates.  He stays active.  He still works.  He works in his yard.  He suffered through the loss of his daughter many years ago and it will need and grandson who died a couple of years ago on Christmas Eve from cancer.  He  said however the good things have happened to his family in the last year.  He denies any palpitations, presyncope or syncope.  He has no chest pressure, neck or arm discomfort.  He has no weight gain or edema.  The patient does not have symptoms concerning for COVID-19 infection (fever, chills, cough, or new shortness of breath).    Past Medical History:  Diagnosis Date  . Allergy    mild  . Anemia    Iron deficiency  . Anxiety state, unspecified   . Aortic atherosclerosis ([place])   . Arthritis   . Calculus of kidney    kidney stones  - normal BMP   . Cancer [facility])    Prostate, skin cancer x 2   . Cataract    forming both eyes  . Depressive disorder, not elsewhere classified   . Diverticulosis of colon (without mention of hemorrhage)   . GERD (gastroesophageal reflux disease)    on nexium  . Gout, unspecified   . Heart murmur   . Hyperlipidemia   . Hypertension, essential   . Irritable bowel syndrome   . Neuromuscular disorder (HCC)    numbness in knees from back issues   . Personal history of malignant neoplasm of prostate   . PVC (premature ventricular contraction)   . Type II or unspecified type diabetes mellitus without mention of complication, not stated as  uncontrolled    Past Surgical History:  Procedure Laterality Date  . COLONOSCOPY    . HERNIA REPAIR     left side  . PROSTATE SURGERY    . skin cancer removed     chest x 2 3-4 yrs ago   . TONSILLECTOMY      Prior to Admission medications   Medication Sig Start Date End Date Taking? Authorizing Provider  acetaminophen (TYLENOL) 325 MG tablet Take 650 mg by mouth every 6 (six) hours as needed for mild pain.   Yes [provider]  amLODipine (NORVASC) 10 MG tablet TAKE ONE (1) TABLET EACH DAY 10/16/18  Yes Chipper Herb, MD  atorvastatin (LIPITOR) 80 MG tablet TAKE ONE (1) TABLET EACH DAY Patient taking differently: Take 40 mg by mouth daily at 6 PM.  11/01/18  Yes Chipper Herb, MD  benazepril  (LOTENSIN) 20 MG tablet Take 1 tablet (20 mg total) by mouth 2 (two) times daily. Patient taking differently: Take 40 mg by mouth daily.  06/08/18  Yes Minus Breeding, MD  beta carotene 25000 UNIT capsule Take 25,000 Units by mouth daily.   Yes [provider]  Blood Glucose Calibration (OT ULTRA/FASTTK CNTRL SOLN) SOLN Use with meter as directed Dx E11.9 12/05/18  Yes Chipper Herb, MD  blood glucose meter kit and supplies Dispense based on patient and insurance preference. Use up to four times daily as directed. (FOR ICD-10 E10.9, E11.9). 10/05/17  Yes Chipper Herb, MD  Cholecalciferol (VITAMIN D) 2000 UNITS CAPS Take 2 capsules by mouth daily.    Yes [provider]  FeFum-FePo-FA-B Cmp-C-Zn-Mn-Cu (SE-TAN PLUS) 162-115.2-1 MG CAPS TAKE ONE CAPSULE BY MOUTH TWICE A DAY 10/16/18  Yes Chipper Herb, MD  Fiber POWD Take 1 Scoop by mouth daily as needed.    Yes [provider]  fish oil-omega-3 fatty acids 1000 MG capsule Take 1 g by mouth 2 (two) times daily.    Yes [provider]  glimepiride (AMARYL) 4 MG tablet TAKE 1/2 TABLET TWICE DAILY 09/14/18  Yes Chipper Herb, MD  losartan-hydrochlorothiazide [facility]) 100-25 MG tablet TAKE ONE (1) TABLET EACH DAY 10/16/18  Yes Chipper Herb, MD  metFORMIN (GLUCOPHAGE) 500 MG tablet TAKE 2 TABLETS EACH MORNING AND 1 [place] EVENING Patient taking differently: TAKE 1 TABLETS EACH MORNING AND 1 TABLET EACH EVENING 10/16/18  Yes Chipper Herb, MD  Multiple Vitamins-Minerals (CENTRUM SILVER PO) Take 1 tablet by mouth daily.    Yes [provider]  omeprazole (PRILOSEC) 20 MG capsule TAKE ONE (1) CAPSULE EACH DAY 10/16/18  Yes Chipper Herb, MD  PARoxetine (PAXIL) 40 MG tablet TAKE ONE (1) TABLET EACH DAY 10/16/18  Yes Chipper Herb, MD  traMADol (ULTRAM) 50 MG tablet Take 1 tablet (50 mg total) by mouth every 6 (six) hours as needed for moderate pain. 10/14/18  Yes Chipper Herb, MD  ONE TOUCH ULTRA  TEST test strip USE UP TO FOUR TIMES A DAY AS DIRECTED 11/21/18   Chipper Herb, MD  febuxostat (ULORIC) 40 MG tablet Take 80 mg by mouth daily.  10/13/11  [provider]  simvastatin (ZOCOR) 80 MG tablet Take 80 mg by mouth at bedtime.  07/03/18  [provider]    Allergies:   Patient has no known allergies.   Social History   Tobacco Use  . Smoking status: Former Smoker    Packs/day: 1.00    Years: 19.00  Pack years: 19.00    Types: Cigarettes    Last attempt to quit: 10/13/1978    Years since quitting: 40.1  . Smokeless tobacco: Former Systems developer    Types: Snuff    Quit date: 05/30/2008  Substance Use Topics  . Alcohol use: Yes    Comment: social   . Drug use: No     Family Hx: The patient's family history includes Cancer in his maternal aunt; Coronary artery disease (age of onset: 59) in his father; Heart attack in his paternal grandfather; Heart disease in his maternal grandfather; Heart disease (age of onset: 47) in his maternal grandmother; Heart disease (age of onset: 55) in his paternal grandfather; Hyperlipidemia in his mother; Hypertension in his mother and son; Other in his daughter; Pancreatic cancer in his paternal aunt; Prostate cancer in his father and paternal uncle. There is no history of Colon cancer, Colon polyps, Esophageal cancer, Rectal cancer, or Stomach cancer.  ROS:   Please see the history of present illness.    As stated in the HPI and negative for all other systems.   Prior CV studies:   The following studies were reviewed today:    Labs/Other Tests and Data Reviewed:    EKG:  No ECG reviewed.  Recent Labs: 11/03/2018: ALT 18; BUN 14; Creatinine, Ser 0.98; Hemoglobin 12.0; Platelets 239; Potassium 4.8; Sodium 145 12/05/2018: TSH 1.330   Recent Lipid Panel Lab Results  Component Value Date/Time   CHOL 94 (L) 11/03/2018 03:15 PM   CHOL 103 11/15/2012 11:08 AM   TRIG 47 11/03/2018 03:15 PM   TRIG 70 12/30/2016 11:51 AM   TRIG  50 11/15/2012 11:08 AM   HDL 38 (L) 11/03/2018 03:15 PM   HDL 34 (L) 12/30/2016 11:51 AM   HDL 37 (L) 11/15/2012 11:08 AM   CHOLHDL 2.5 11/03/2018 03:15 PM   LDLCALC 47 11/03/2018 03:15 PM   LDLCALC 61 01/29/2014 11:35 AM   LDLCALC 56 11/15/2012 11:08 AM    Wt Readings from Last 3 Encounters:  08/01/18 199 lb (90.3 kg)  07/13/18 201 lb (91.2 kg)  07/03/18 201 lb (91.2 kg)     Objective:    Vital Signs:  There were no vitals taken for this visit.   VITAL SIGNS:  reviewed  ASSESSMENT & PLAN:    PVCs:    I wonder if he is having under counted PVCs to account for his low heart rate.  Regardless he does not feel these particularly and is not bothered by them.  No change in therapy.  THORACIC AORTIC ATHEROSCLEROSIS:  This has been noted.  He has excellent risk reduction as below.  DM:    This is followed by Dr. Laurance Flatten. His A1c was 6.3.    No change in therapy.   DYSLIPIDEMIA:   LDL recently was 47 with an HDL of 38.  Continue current meds.  HTN:   We reviewed extensively his blood pressure diary and his blood pressure is well controlled.  He is on an ACE inhibitor and an ARB.  This is probably an oversight.  I doubt that he is getting any benefit from having both drugs in place.  We had a long discussion about this though he is a little bit reluctant he understands the concept of not having redundant medicines and so we will stop his ACE inhibitor and keep his blood pressure diary.  COVID-19 Education: The signs and symptoms of COVID-19 were discussed with the patient and how to seek care for  testing (follow up with PCP or arrange E-visit).  The importance of social distancing was discussed today.  Time:   Today, I have spent 25 minutes with the patient with telehealth technology discussing the above problems.     Medication Adjustments/Labs and Tests Ordered: Current medicines are reviewed at length with the patient today.  Concerns regarding medicines are outlined above.    Tests Ordered: No orders of the defined types were placed in this encounter.   Medication Changes: No orders of the defined types were placed in this encounter.   Disposition:  Follow up with me in one year  Signed, Minus Breeding, MD  12/11/2018 3:34 PM    [place]

## 2018-12-12 ENCOUNTER — Encounter: Payer: Self-pay | Admitting: Cardiology

## 2018-12-12 ENCOUNTER — Telehealth (INDEPENDENT_AMBULATORY_CARE_PROVIDER_SITE_OTHER): Payer: Medicare Other | Admitting: Cardiology

## 2018-12-12 VITALS — BP 158/59 | HR 43 | Ht 65.0 in | Wt 165.0 lb

## 2018-12-12 DIAGNOSIS — I10 Essential (primary) hypertension: Secondary | ICD-10-CM

## 2018-12-12 DIAGNOSIS — Z7189 Other specified counseling: Secondary | ICD-10-CM

## 2018-12-12 DIAGNOSIS — I700 Atherosclerosis of aorta: Secondary | ICD-10-CM

## 2018-12-12 DIAGNOSIS — I493 Ventricular premature depolarization: Secondary | ICD-10-CM

## 2018-12-12 DIAGNOSIS — E785 Hyperlipidemia, unspecified: Secondary | ICD-10-CM

## 2018-12-12 NOTE — Patient Instructions (Addendum)
Medication Instructions:  STOP- Benazepril  If you need a refill on your cardiac medications before your next appointment, please call your pharmacy.  Labwork: None Ordered   Testing/Procedures: None Ordered  Follow-Up: You will need a follow up appointment in 1 Year.  Please call our office 2 months in advance to schedule this appointment.  You may see Minus Breeding, MD or one of the following Advanced Practice Providers on your designated Care Team:   Rosaria Ferries, PA-C . Jory Sims, DNP, ANP    At [facility], you and your health needs are our priority.  As part of our continuing mission to provide you with exceptional heart care, we have created designated Provider Care Teams.  These Care Teams include your primary Cardiologist (physician) and Advanced Practice Providers (APPs -  Physician Assistants and Nurse Practitioners) who all work together to provide you with the care you need, when you need it.  Thank you for choosing [facility] at [facility]!!

## 2019-01-02 ENCOUNTER — Other Ambulatory Visit: Payer: Self-pay

## 2019-01-03 ENCOUNTER — Ambulatory Visit (INDEPENDENT_AMBULATORY_CARE_PROVIDER_SITE_OTHER): Payer: Medicare Other | Admitting: Family Medicine

## 2019-01-03 ENCOUNTER — Telehealth: Payer: Self-pay | Admitting: Family Medicine

## 2019-01-03 ENCOUNTER — Encounter: Payer: Self-pay | Admitting: Family Medicine

## 2019-01-03 VITALS — BP 210/110 | HR 48 | Temp 97.5°F | Ht 62.5 in | Wt 188.0 lb

## 2019-01-03 DIAGNOSIS — Z024 Encounter for examination for driving license: Secondary | ICD-10-CM | POA: Diagnosis not present

## 2019-01-03 DIAGNOSIS — I10 Essential (primary) hypertension: Secondary | ICD-10-CM

## 2019-01-03 LAB — URINALYSIS
Bilirubin, UA: NEGATIVE
Glucose, UA: NEGATIVE
Leukocytes,UA: NEGATIVE
Nitrite, UA: NEGATIVE
Protein,UA: NEGATIVE
RBC, UA: NEGATIVE
Specific Gravity, UA: 1.025 (ref 1.005–1.030)
Urobilinogen, Ur: 1 mg/dL (ref 0.2–1.0)
pH, UA: 5.5 (ref 5.0–7.5)

## 2019-01-03 MED ORDER — CHLORTHALIDONE 25 MG PO TABS: 25.0000 mg | ORAL_TABLET | Freq: Every day | ORAL | 2 refills | Status: DC

## 2019-01-03 NOTE — Progress Notes (Signed)
Subjective:  Patient ID: George Wang, male    DOB: [birth year]  Age: 73 y.o. MRN: [id number]  CC: DOT physical   HPI [name] presents for DOT exam. BP elevated. After multiple attempts, his BP came down to 158/82. Initially it was 220/110. He insists that his blood pressure is always good at home, and he checks his monitor against ours for accuracy every time he sees Dr. Laurance Flatten. They are always in agreement, he states. He was taken off losartan three weeks since he was also taking benazepril. However, his BP didn't go up at home. Questions why we would have such a high reading.  Depression screen [facility] 2/9 01/03/2019 08/01/2018 07/03/2018  Decreased Interest 0 0 1  Down, Depressed, Hopeless 0 1 1  PHQ - 2 Score 0 1 2  Altered sleeping - - 0  Tired, decreased energy - - 1  Change in appetite - - 0  Feeling bad or failure about yourself  - - 0  Trouble concentrating - - 1  Moving slowly or fidgety/restless - - 0  Suicidal thoughts - - 0  PHQ-9 Score - - 4  Difficult doing work/chores - - Somewhat difficult    History Patrice has a past medical history of Allergy, Anemia, Anxiety state, unspecified, Aortic atherosclerosis ([place]), Arthritis, Calculus of kidney, Cancer ([place]), Cataract, Depressive disorder, not elsewhere classified, Diverticulosis of colon (without mention of hemorrhage), GERD (gastroesophageal reflux disease), Gout, unspecified, Heart murmur, Hyperlipidemia, Hypertension, essential, Irritable bowel syndrome, Neuromuscular disorder ([place]), Personal history of malignant neoplasm of prostate, PVC (premature ventricular contraction), and Type II or unspecified type diabetes mellitus without mention of complication, not stated as uncontrolled.   He has a past surgical history that includes Hernia repair; Prostate surgery; Tonsillectomy; Colonoscopy; and skin cancer removed.   His family history includes Cancer in his maternal aunt; Coronary artery disease (age of onset: 23) in  his father; Heart attack in his paternal grandfather; Heart disease in his maternal grandfather; Heart disease (age of onset: 59) in his maternal grandmother; Heart disease (age of onset: 87) in his paternal grandfather; Hyperlipidemia in his mother; Hypertension in his mother and son; Other in his daughter; Pancreatic cancer in his paternal aunt; Prostate cancer in his father and paternal uncle.He reports that he quit smoking about 40 years ago. His smoking use included cigarettes. He has a 19.00 pack-year smoking history. He quit smokeless tobacco use about 10 years ago.  His smokeless tobacco use included snuff. He reports current alcohol use. He reports that he does not use drugs.    ROS Review of Systems  Constitutional: Negative.   HENT: Negative.   Eyes: Negative for visual disturbance.  Respiratory: Negative for cough and shortness of breath.   Cardiovascular: Negative for chest pain and leg swelling.  Gastrointestinal: Negative for abdominal pain, diarrhea, nausea and vomiting.  Genitourinary: Negative for difficulty urinating.  Musculoskeletal: Negative for arthralgias and myalgias.  Skin: Negative for rash.  Neurological: Negative for headaches.  Psychiatric/Behavioral: Negative for sleep disturbance.    Objective:  BP (!) 210/110   Pulse (!) 48   Temp (!) 97.5 F (36.4 C) (Oral)   Ht 5' 2.5" (1.588 m)   Wt 188 lb (85.3 kg)   BMI 33.84 kg/m   BP Readings from Last 3 Encounters:  01/03/19 (!) 210/110  12/12/18 (!) 158/59  12/05/18 (!) 147/61    Wt Readings from Last 3 Encounters:  01/03/19 188 lb (85.3 kg)  12/12/18 165 lb (74.8  kg)  08/01/18 199 lb (90.3 kg)     Physical Exam Constitutional:      General: He is not in acute distress.    Appearance: He is well-developed.  HENT:     Head: Normocephalic and atraumatic.     Right Ear: External ear normal.     Left Ear: External ear normal.     Nose: Nose normal.  Eyes:     Conjunctiva/sclera: Conjunctivae  normal.     Pupils: Pupils are equal, round, and reactive to light.  Neck:     Musculoskeletal: Normal range of motion and neck supple.  Cardiovascular:     Rate and Rhythm: Normal rate and regular rhythm.     Heart sounds: Normal heart sounds. No murmur.  Pulmonary:     Effort: Pulmonary effort is normal. No respiratory distress.     Breath sounds: Normal breath sounds. No wheezing or rales.  Abdominal:     Palpations: Abdomen is soft.     Tenderness: There is no abdominal tenderness.  Musculoskeletal: Normal range of motion.  Skin:    General: Skin is warm and dry.  Neurological:     Mental Status: He is alert and oriented to person, place, and time.     Deep Tendon Reflexes: Reflexes are normal and symmetric.  Psychiatric:        Behavior: Behavior normal.        Thought Content: Thought content normal.        Judgment: Judgment normal.       Assessment & Plan:   Daiki was seen today for dot physical.  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 For blood pressure       I have discontinued Quin Hoop. Bracy's losartan-hydrochlorothiazide. I am also having him start on chlorthalidone. Additionally, I am having him maintain his beta carotene, Multiple Vitamins-Minerals (CENTRUM SILVER PO), fish oil-omega-3 fatty acids, Vitamin D, acetaminophen, Fiber, blood glucose meter kit and supplies, glimepiride, traMADol, omeprazole, PARoxetine, amLODipine, Se-Tan PLUS, metFORMIN, atorvastatin, ONE TOUCH ULTRA TEST, OT ULTRA/FASTTK CNTRL SOLN, and benazepril.  Allergies as of 01/03/2019   No Known Allergies     Medication List       Accurate as of January 03, 2019  2:37 PM. If you have any questions, ask your nurse or doctor.        STOP taking these  medications   losartan-hydrochlorothiazide 100-25 MG tablet Commonly known as: HYZAAR Stopped by: Claretta Fraise, MD     TAKE these medications   acetaminophen 325 MG tablet Commonly known as: TYLENOL Take 650 mg by mouth every 6 (six) hours as needed for mild pain.   amLODipine 10 MG tablet Commonly known as: NORVASC TAKE ONE (1) TABLET EACH DAY   atorvastatin 80 MG tablet Commonly known as: LIPITOR TAKE ONE (1) TABLET EACH DAY What changed: See the new instructions.   benazepril 20 MG tablet Commonly known as: LOTENSIN Take 20 mg by mouth daily.   beta carotene 25000 UNIT capsule Take 25,000 Units by mouth daily.   blood glucose meter kit and supplies Dispense based on patient and insurance preference. Use up to four times daily as directed. (FOR ICD-10 E10.9, E11.9).   CENTRUM SILVER PO Take 1 tablet by mouth  daily.   chlorthalidone 25 MG tablet Commonly known as: HYGROTON Take 1 tablet (25 mg total) by mouth daily. For blood pressure Started by: Claretta Fraise, MD   Fiber Powd Take 1 Scoop by mouth daily as needed.   fish oil-omega-3 fatty acids 1000 MG capsule Take 1 g by mouth 2 (two) times daily.   glimepiride 4 MG tablet Commonly known as: AMARYL TAKE 1/2 TABLET TWICE DAILY   metFORMIN 500 MG tablet Commonly known as: GLUCOPHAGE TAKE 2 TABLETS EACH MORNING AND 1 TABLETEACH EVENING What changed: See the new instructions.   omeprazole 20 MG capsule Commonly known as: PRILOSEC TAKE ONE (1) CAPSULE EACH DAY   ONE TOUCH ULTRA TEST test strip Generic drug: glucose blood USE UP TO FOUR TIMES A DAY AS DIRECTED   OT ULTRA/FASTTK CNTRL SOLN Soln Use with meter as directed Dx E11.9   PARoxetine 40 MG tablet Commonly known as: PAXIL TAKE ONE (1) TABLET EACH DAY   Se-Tan PLUS 162-115.2-1 MG Caps TAKE ONE CAPSULE BY MOUTH TWICE A DAY   traMADol 50 MG tablet Commonly known as: ULTRAM Take 1 tablet (50 mg total) by mouth every 6 (six) hours as needed for  moderate pain.   Vitamin D 50 MCG (2000 UT) Caps Take 2 capsules by mouth daily.        Follow-up: Return in about 1 year (around 01/03/2020). 90 day DOT card given.  Claretta Fraise, M.D.

## 2019-01-03 NOTE — Telephone Encounter (Signed)
Aware. 

## 2019-01-03 NOTE — Telephone Encounter (Signed)
I was waiting until I had a chance to further study his record. After doing so I just sent in Chlorthalidone, a diuretic - based BP agent. Thanks, WS

## 2019-01-03 NOTE — Telephone Encounter (Signed)
Pt asking about new BP med Pt understood at visit this AM that he was going to be put on new BP med Please advise

## 2019-01-15 ENCOUNTER — Other Ambulatory Visit: Payer: Self-pay | Admitting: Family Medicine

## 2019-01-16 ENCOUNTER — Other Ambulatory Visit: Payer: Self-pay

## 2019-01-17 ENCOUNTER — Encounter: Payer: Self-pay | Admitting: Family Medicine

## 2019-01-17 ENCOUNTER — Ambulatory Visit (INDEPENDENT_AMBULATORY_CARE_PROVIDER_SITE_OTHER): Payer: Medicare Other | Admitting: Family Medicine

## 2019-01-17 VITALS — BP 124/47 | HR 43 | Temp 97.3°F | Ht 62.0 in | Wt 186.0 lb

## 2019-01-17 DIAGNOSIS — I10 Essential (primary) hypertension: Secondary | ICD-10-CM

## 2019-01-17 DIAGNOSIS — E663 Overweight: Secondary | ICD-10-CM | POA: Diagnosis not present

## 2019-01-17 NOTE — Patient Instructions (Signed)

## 2019-01-17 NOTE — Progress Notes (Signed)
Subjective:  Patient ID: George Wang, male    DOB: 05-13-1946  Age: 73 y.o. MRN: [id number]  CC: Recheck BP   HPI George Wang presents for  follow-up of hypertension. Readings at home are running 315-176 systolic mostly. Diastolic staying in range. Pt. On 90 day DOT card. Needs BP below 140/90. Also needs hearing eval. Planning to go to [facility] [place] for next DOT eval since they have a hearingbooth. Patient has no history of headache chest pain or shortness of breath or recent cough. Patient also denies symptoms of TIA such as focal numbness or weakness. Patient denies side effects from medication. States taking it regularly.   History George Wang has a past medical history of Allergy, Anemia, Anxiety state, unspecified, Aortic atherosclerosis ([place]), Arthritis, Calculus of kidney, Cancer ([place]), Cataract, Depressive disorder, not elsewhere classified, Diverticulosis of colon (without mention of hemorrhage), GERD (gastroesophageal reflux disease), Gout, unspecified, Heart murmur, Hyperlipidemia, Hypertension, essential, Irritable bowel syndrome, Neuromuscular disorder ([place]), Personal history of malignant neoplasm of prostate, PVC (premature ventricular contraction), and Type II or unspecified type diabetes mellitus without mention of complication, not stated as uncontrolled.   He has a past surgical history that includes Hernia repair; Prostate surgery; Tonsillectomy; Colonoscopy; and skin cancer removed.   His family history includes Cancer in his maternal aunt; Coronary artery disease (age of onset: 8) in his father; Heart attack in his paternal grandfather; Heart disease in his maternal grandfather; Heart disease (age of onset: 38) in his maternal grandmother; Heart disease (age of onset: 70) in his paternal grandfather; Hyperlipidemia in his mother; Hypertension in his mother and son; Other in his daughter; Pancreatic cancer in his paternal aunt; Prostate cancer in his father and paternal  uncle.He reports that he quit smoking about 40 years ago. His smoking use included cigarettes. He has a 19.00 pack-year smoking history. He quit smokeless tobacco use about 10 years ago.  His smokeless tobacco use included snuff. He reports current alcohol use. He reports that he does not use drugs.  Current Outpatient Medications on File Prior to Visit  Medication Sig Dispense Refill  . acetaminophen (TYLENOL) 325 MG tablet Take 650 mg by mouth every 6 (six) hours as needed for mild pain.    Marland Kitchen amLODipine (NORVASC) 10 MG tablet TAKE ONE (1) TABLET EACH DAY 90 tablet 1  . atorvastatin (LIPITOR) 80 MG tablet TAKE ONE (1) TABLET EACH DAY (Patient taking differently: Take 40 mg by mouth daily at 6 PM. ) 90 tablet 0  . benazepril (LOTENSIN) 20 MG tablet Take 20 mg by mouth daily.    . beta carotene 25000 UNIT capsule Take 25,000 Units by mouth daily.    . Blood Glucose Calibration (OT ULTRA/FASTTK CNTRL SOLN) SOLN Use with meter as directed Dx E11.9 1 each 1  . blood glucose meter kit and supplies Dispense based on patient and insurance preference. Use up to four times daily as directed. (FOR ICD-10 E10.9, E11.9). 1 each 1  . chlorthalidone (HYGROTON) 25 MG tablet Take 1 tablet (25 mg total) by mouth daily. For blood pressure 30 tablet 2  . Cholecalciferol (VITAMIN D) 2000 UNITS CAPS Take 2 capsules by mouth daily.     Marland Kitchen FeFum-FePo-FA-B Cmp-C-Zn-Mn-Cu (SE-TAN PLUS) 162-115.2-1 MG CAPS TAKE ONE CAPSULE BY MOUTH TWICE A DAY 180 capsule 0  . Fiber POWD Take 1 Scoop by mouth daily as needed.     . fish oil-omega-3 fatty acids 1000 MG capsule Take 1 g by mouth 2 (  two) times daily.     Marland Kitchen glimepiride (AMARYL) 4 MG tablet TAKE 1/2 TABLET TWICE DAILY 90 tablet 0  . metFORMIN (GLUCOPHAGE) 500 MG tablet TAKE 2 TABLETS EACH MORNING AND 1 TABLETEACH EVENING (Patient taking differently: TAKE 1 TABLETS EACH MORNING AND 1 TABLET EACH EVENING) 270 tablet 0  . Multiple Vitamins-Minerals (CENTRUM SILVER PO) Take 1 tablet  by mouth daily.     Marland Kitchen omeprazole (PRILOSEC) 20 MG capsule TAKE ONE (1) CAPSULE EACH DAY 90 capsule 0  . ONE TOUCH ULTRA TEST test strip USE UP TO FOUR TIMES A DAY AS DIRECTED 100 each 11  . PARoxetine (PAXIL) 40 MG tablet TAKE ONE (1) TABLET EACH DAY 90 tablet 0  . traMADol (ULTRAM) 50 MG tablet Take 1 tablet (50 mg total) by mouth every 6 (six) hours as needed for moderate pain. (Patient not taking: Reported on 01/17/2019) 20 tablet 0  . [DISCONTINUED] febuxostat (ULORIC) 40 MG tablet Take 80 mg by mouth daily.    . [DISCONTINUED] simvastatin (ZOCOR) 80 MG tablet Take 80 mg by mouth at bedtime.     No current facility-administered medications on file prior to visit.     ROS Review of Systems  Constitutional: Negative for fever.  Respiratory: Negative for shortness of breath.   Cardiovascular: Negative for chest pain.  Musculoskeletal: Negative for arthralgias.  Skin: Negative for rash.    Objective:  BP (!) 124/47   Pulse (!) 43   Temp (!) 97.3 F (36.3 C) (Oral)   Ht 5' 2"  (1.575 m)   Wt 186 lb (84.4 kg)   BMI 34.02 kg/m   BP Readings from Last 3 Encounters:  01/17/19 (!) 124/47  01/03/19 (!) 210/110  12/12/18 (!) 158/59    Wt Readings from Last 3 Encounters:  01/17/19 186 lb (84.4 kg)  01/03/19 188 lb (85.3 kg)  12/12/18 165 lb (74.8 kg)     Physical Exam Vitals signs reviewed.  Constitutional:      Appearance: He is well-developed.  HENT:     Head: Normocephalic and atraumatic.     Right Ear: External ear normal.     Left Ear: External ear normal.     Mouth/Throat:     Pharynx: No oropharyngeal exudate or posterior oropharyngeal erythema.  Eyes:     Pupils: Pupils are equal, round, and reactive to light.  Neck:     Musculoskeletal: Normal range of motion and neck supple.  Cardiovascular:     Rate and Rhythm: Normal rate and regular rhythm.     Heart sounds: No murmur.  Pulmonary:     Effort: No respiratory distress.     Breath sounds: Normal breath  sounds.  Neurological:     Mental Status: He is alert and oriented to person, place, and time.       Assessment & Plan:   George Wang was seen today for recheck bp.  Diagnoses and all orders for this visit:  Essential hypertension  Overweight   Allergies as of 01/17/2019   No Known Allergies     Medication List       Accurate as of January 17, 2019  6:43 PM. If you have any questions, ask your nurse or doctor.        acetaminophen 325 MG tablet Commonly known as: TYLENOL Take 650 mg by mouth every 6 (six) hours as needed for mild pain.   amLODipine 10 MG tablet Commonly known as: NORVASC TAKE ONE (1) TABLET EACH DAY   atorvastatin 80  MG tablet Commonly known as: LIPITOR TAKE ONE (1) TABLET EACH DAY What changed: See the new instructions.   benazepril 20 MG tablet Commonly known as: LOTENSIN Take 20 mg by mouth daily.   beta carotene 25000 UNIT capsule Take 25,000 Units by mouth daily.   blood glucose meter kit and supplies Dispense based on patient and insurance preference. Use up to four times daily as directed. (FOR ICD-10 E10.9, E11.9).   CENTRUM SILVER PO Take 1 tablet by mouth daily.   chlorthalidone 25 MG tablet Commonly known as: HYGROTON Take 1 tablet (25 mg total) by mouth daily. For blood pressure   Fiber Powd Take 1 Scoop by mouth daily as needed.   fish oil-omega-3 fatty acids 1000 MG capsule Take 1 g by mouth 2 (two) times daily.   glimepiride 4 MG tablet Commonly known as: AMARYL TAKE 1/2 TABLET TWICE DAILY   metFORMIN 500 MG tablet Commonly known as: GLUCOPHAGE TAKE 2 TABLETS EACH MORNING AND 1 TABLETEACH EVENING What changed: See the new instructions.   omeprazole 20 MG capsule Commonly known as: PRILOSEC TAKE ONE (1) CAPSULE EACH DAY   ONE TOUCH ULTRA TEST test strip Generic drug: glucose blood USE UP TO FOUR TIMES A DAY AS DIRECTED   OT ULTRA/FASTTK CNTRL SOLN Soln Use with meter as directed Dx E11.9   PARoxetine 40 MG tablet  Commonly known as: PAXIL TAKE ONE (1) TABLET EACH DAY   Se-Tan PLUS 162-115.2-1 MG Caps TAKE ONE CAPSULE BY MOUTH TWICE A DAY   traMADol 50 MG tablet Commonly known as: ULTRAM Take 1 tablet (50 mg total) by mouth every 6 (six) hours as needed for moderate pain.   Vitamin D 50 MCG (2000 UT) Caps Take 2 capsules by mouth daily.       No orders of the defined types were placed in this encounter.   DASH Program reviewed. Handout given.   Follow-up: 1 month  Claretta Fraise, M.D.

## 2019-01-23 ENCOUNTER — Other Ambulatory Visit: Payer: Self-pay | Admitting: Family Medicine

## 2019-02-27 ENCOUNTER — Other Ambulatory Visit: Payer: Self-pay | Admitting: Family Medicine

## 2019-03-13 ENCOUNTER — Other Ambulatory Visit: Payer: Self-pay | Admitting: Family Medicine

## 2019-03-13 NOTE — Telephone Encounter (Signed)
OV 04/06/19

## 2019-03-31 ENCOUNTER — Other Ambulatory Visit: Payer: Self-pay | Admitting: Family Medicine

## 2019-04-05 ENCOUNTER — Other Ambulatory Visit: Payer: Self-pay

## 2019-04-06 ENCOUNTER — Ambulatory Visit (INDEPENDENT_AMBULATORY_CARE_PROVIDER_SITE_OTHER): Payer: Medicare Other | Admitting: Family Medicine

## 2019-04-06 ENCOUNTER — Encounter: Payer: Self-pay | Admitting: Family Medicine

## 2019-04-06 ENCOUNTER — Other Ambulatory Visit: Payer: Self-pay

## 2019-04-06 VITALS — BP 124/54 | HR 38 | Temp 97.1°F | Resp 16 | Ht 62.0 in | Wt 184.2 lb

## 2019-04-06 DIAGNOSIS — Z23 Encounter for immunization: Secondary | ICD-10-CM

## 2019-04-06 DIAGNOSIS — Z79891 Long term (current) use of opiate analgesic: Secondary | ICD-10-CM | POA: Diagnosis not present

## 2019-04-06 DIAGNOSIS — E1169 Type 2 diabetes mellitus with other specified complication: Secondary | ICD-10-CM | POA: Diagnosis not present

## 2019-04-06 DIAGNOSIS — I10 Essential (primary) hypertension: Secondary | ICD-10-CM

## 2019-04-06 DIAGNOSIS — E782 Mixed hyperlipidemia: Secondary | ICD-10-CM

## 2019-04-06 DIAGNOSIS — R001 Bradycardia, unspecified: Secondary | ICD-10-CM

## 2019-04-06 DIAGNOSIS — E785 Hyperlipidemia, unspecified: Secondary | ICD-10-CM | POA: Diagnosis not present

## 2019-04-06 DIAGNOSIS — M5440 Lumbago with sciatica, unspecified side: Secondary | ICD-10-CM

## 2019-04-06 LAB — BAYER DCA HB A1C WAIVED: HB A1C (BAYER DCA - WAIVED): 6.1 % (ref <7.0)

## 2019-04-06 MED ORDER — TRAMADOL HCL 50 MG PO TABS: 50.0000 mg | ORAL_TABLET | Freq: Four times a day (QID) | ORAL | 0 refills | Status: DC | PRN

## 2019-04-06 MED ORDER — FAMOTIDINE 20 MG PO TABS: 20.0000 mg | ORAL_TABLET | Freq: Two times a day (BID) | ORAL | 3 refills | Status: DC | PRN

## 2019-04-06 MED ORDER — OMEPRAZOLE 20 MG PO CPDR: DELAYED_RELEASE_CAPSULE | ORAL | 2 refills | Status: DC

## 2019-04-06 MED ORDER — LOSARTAN POTASSIUM 100 MG PO TABS: 100.0000 mg | ORAL_TABLET | Freq: Every day | ORAL | 3 refills | Status: DC

## 2019-04-06 NOTE — Progress Notes (Signed)
BP (!) 124/54   Pulse (!) 38   Temp (!) 97.1 F (36.2 C) (Temporal)   Resp 16   Ht 5' 2"  (1.575 m)   Wt 184 lb 3.2 oz (83.6 kg)   SpO2 98%   BMI 33.69 kg/m    Subjective:   Patient ID: George Wang, male    DOB: Oct 06, 1945, 73 y.o.   MRN: [id number]  HPI: George Wang is a 73 y.o. male presenting on 04/06/2019 for Medical Management of Chronic Issues   HPI Type 2 diabetes mellitus Patient comes in today for recheck of his diabetes. Patient has been currently taking glimepiride and metformin. Patient is currently on an ACE inhibitor/ARB. Patient has not seen an ophthalmologist this year. Patient denies any issues with their feet.   Hypertension Patient is currently on losartan and chlorthalidone and amlodipine, and their blood pressure today is 124/54. Patient denies any lightheadedness or dizziness. Patient denies headaches, blurred vision, chest pains, shortness of breath, or weakness. Denies any side effects from medication and is content with current medication.   Hyperlipidemia Patient is coming in for recheck of his hyperlipidemia. The patient is currently taking fish oil and atorvastatin. They deny any issues with myalgias or history of liver damage from it. They deny any focal numbness or weakness or chest pain.   Pain assessment: Cause of pain-chronic low back pain/degenerative disc disease Pain location-lower back Pain on scale of 1-10-0 today Frequency-couple times a month What increases pain-overworking What makes pain Better-Tylenol and tramadol Effects on ADL -none Any change in general medical condition-none  Current opioids rx-tramadol 50 every 6 hours as needed # meds rx-20 Effectiveness of current meds-works well, only uses infrequently Adverse reactions form pain meds-none Morphine equivalent-20 mg  Pill count performed-No Last drug screen -N/A ( high risk q67m moderate risk q675mlow risk yearly ) Urine drug screen today- Yes Was the [place]-yes  If yes were their any concerning findings? -None  No flowsheet data found.   Pain contract signed on: Today  Relevant past medical, surgical, family and social history reviewed and updated as indicated. Interim medical history since our last visit reviewed. Allergies and medications reviewed and updated.  Review of Systems  Constitutional: Negative for chills and fever.  Respiratory: Negative for shortness of breath and wheezing.   Cardiovascular: Negative for chest pain and leg swelling.  Musculoskeletal: Positive for back pain. Negative for gait problem.  Skin: Negative for rash.  Neurological: Negative for dizziness and weakness.  All other systems reviewed and are negative.   Per HPI unless specifically indicated above   Allergies as of 04/06/2019   No Known Allergies     Medication List       Accurate as of April 06, 2019  1:51 PM. If you have any questions, ask your nurse or doctor.        acetaminophen 325 MG tablet Commonly known as: TYLENOL Take 650 mg by mouth every 6 (six) hours as needed for mild pain.   amLODipine 10 MG tablet Commonly known as: NORVASC TAKE ONE (1) TABLET EACH DAY   atorvastatin 80 MG tablet Commonly known as: LIPITOR TAKE ONE (1) TABLET EACH DAY What changed: See the new instructions.   benazepril 20 MG tablet Commonly known as: LOTENSIN Take 20 mg by mouth daily.   beta carotene 25000 UNIT capsule Take 25,000 Units by mouth daily.   blood glucose meter kit and supplies Dispense based on patient and insurance preference.  Use up to four times daily as directed. (FOR ICD-10 E10.9, E11.9).   CENTRUM SILVER PO Take 1 tablet by mouth daily.   chlorthalidone 25 MG tablet Commonly known as: HYGROTON TAKE 1 TABLET DAILY FOR BLOOD PRESSURE   Fiber Powd Take 1 Scoop by mouth daily as needed.   fish oil-omega-3 fatty acids 1000 MG capsule Take 1 g by mouth 2 (two) times daily.   glimepiride 4 MG tablet  Commonly known as: AMARYL TAKE 1/2 TABLET TWICE DAILY   losartan-hydrochlorothiazide 100-25 MG tablet Commonly known as: HYZAAR TAKE ONE (1) TABLET EACH DAY   metFORMIN 500 MG tablet Commonly known as: GLUCOPHAGE TAKE 2 TABLETS EACH MORNING AND 1 TABLETEACH EVENING   omeprazole 20 MG capsule Commonly known as: PRILOSEC TAKE ONE (1) CAPSULE EACH DAY   ONE TOUCH ULTRA TEST test strip Generic drug: glucose blood USE UP TO FOUR TIMES A DAY AS DIRECTED   OT ULTRA/FASTTK CNTRL SOLN Soln Use with meter as directed Dx E11.9   PARoxetine 40 MG tablet Commonly known as: PAXIL TAKE ONE (1) TABLET EACH DAY   Se-Tan PLUS 162-115.2-1 MG Caps TAKE ONE CAPSULE BY MOUTH TWICE A DAY   traMADol 50 MG tablet Commonly known as: ULTRAM Take 1 tablet (50 mg total) by mouth every 6 (six) hours as needed for moderate pain.   Vitamin D 50 MCG (2000 UT) Caps Take 2 capsules by mouth daily.        Objective:   BP (!) 124/54   Pulse (!) 38   Temp (!) 97.1 F (36.2 C) (Temporal)   Resp 16   Ht '5\' 2"'$  (1.575 m)   Wt 184 lb 3.2 oz (83.6 kg)   SpO2 98%   BMI 33.69 kg/m   Wt Readings from Last 3 Encounters:  04/06/19 184 lb 3.2 oz (83.6 kg)  01/17/19 186 lb (84.4 kg)  01/03/19 188 lb (85.3 kg)    Physical Exam Vitals signs and nursing note reviewed.  Constitutional:      General: He is not in acute distress.    Appearance: He is well-developed. He is not diaphoretic.  Eyes:     General: No scleral icterus.    Conjunctiva/sclera: Conjunctivae normal.  Neck:     Musculoskeletal: Neck supple.     Thyroid: No thyromegaly.  Cardiovascular:     Rate and Rhythm: Normal rate. Rhythm irregular.     Heart sounds: Normal heart sounds. No murmur.  Pulmonary:     Effort: Pulmonary effort is normal. No respiratory distress.     Breath sounds: Normal breath sounds. No wheezing.  Musculoskeletal: Normal range of motion.        General: No tenderness (100 is not examined back or anywhere  else).  Lymphadenopathy:     Cervical: No cervical adenopathy.  Skin:    General: Skin is warm and dry.     Findings: No rash.  Neurological:     Mental Status: He is alert and oriented to person, place, and time.     Coordination: Coordination normal.  Psychiatric:        Behavior: Behavior normal.      EKG: Frequent PVCs, ventricular bigeminy, heart rate of 74 Assessment & Plan:   Problem List Items Addressed This Visit      Cardiovascular and Mediastinum   Essential hypertension   Relevant Medications   losartan (COZAAR) 100 MG tablet   Other Relevant Orders   CMP14+EGFR     Endocrine   Type  2 diabetes mellitus ([place]) - Primary   Relevant Medications   losartan (COZAAR) 100 MG tablet   Other Relevant Orders   CBC with Differential/Platelet   Bayer DCA Hb A1c Waived     Other   Hyperlipemia   Relevant Medications   losartan (COZAAR) 100 MG tablet   Other Relevant Orders   Lipid panel   Dyslipidemia   Relevant Orders   Lipid panel    Other Visit Diagnoses    Bradycardia       Relevant Orders   EKG 12-Lead   EKG 12-Lead (Completed)   Low back pain with sciatica, sciatica laterality unspecified, unspecified back pain laterality, unspecified chronicity       Relevant Medications   traMADol (ULTRAM) 50 MG tablet   Other Relevant Orders   ToxASSURE Select 13 (MW), Urine    EKG is reassuring that his slow heart rate is just a malfunction of the machine pickup, better heart rate just with frequent bigeminy PVCs.  Refill tramadol.  Patient had conflicting medications including chlorthalidone and hydrochlorothiazide so we discontinue the hydrochlorothiazide.  He also had losartan and an ACE inhibitor so we discontinued the ACE inhibitor.  Follow up plan: Return in about 4 months (around 08/06/2019), or if symptoms worsen or fail to improve, for Diabetes and hypertension recheck.  Counseling provided for all of the vaccine components No orders of the defined  types were placed in this encounter.   Caryl Pina, MD [facility] 04/06/2019, 1:51 PM

## 2019-04-07 LAB — CBC WITH DIFFERENTIAL/PLATELET
Basophils Absolute: 0.1 10*3/uL (ref 0.0–0.2)
Basos: 1 %
EOS (ABSOLUTE): 0.1 10*3/uL (ref 0.0–0.4)
Eos: 2 %
Hematocrit: 34.2 % — ABNORMAL LOW (ref 37.5–51.0)
Hemoglobin: 12.2 g/dL — ABNORMAL LOW (ref 13.0–17.7)
Immature Grans (Abs): 0 10*3/uL (ref 0.0–0.1)
Immature Granulocytes: 0 %
Lymphocytes Absolute: 1.6 10*3/uL (ref 0.7–3.1)
Lymphs: 17 %
MCH: 34.1 pg — ABNORMAL HIGH (ref 26.6–33.0)
MCHC: 35.7 g/dL (ref 31.5–35.7)
MCV: 96 fL (ref 79–97)
Monocytes Absolute: 0.7 10*3/uL (ref 0.1–0.9)
Monocytes: 7 %
Neutrophils Absolute: 6.9 10*3/uL (ref 1.4–7.0)
Neutrophils: 73 %
Platelets: 259 10*3/uL (ref 150–450)
RBC: 3.58 x10E6/uL — ABNORMAL LOW (ref 4.14–5.80)
RDW: 11.5 % — ABNORMAL LOW (ref 11.6–15.4)
WBC: 9.4 10*3/uL (ref 3.4–10.8)

## 2019-04-07 LAB — CMP14+EGFR
ALT: 13 IU/L (ref 0–44)
AST: 36 IU/L (ref 0–40)
Albumin/Globulin Ratio: 2.3 — ABNORMAL HIGH (ref 1.2–2.2)
Albumin: 4.3 g/dL (ref 3.7–4.7)
Alkaline Phosphatase: 72 IU/L (ref 39–117)
BUN/Creatinine Ratio: 21 (ref 10–24)
BUN: 22 mg/dL (ref 8–27)
Bilirubin Total: 0.6 mg/dL (ref 0.0–1.2)
CO2: 27 mmol/L (ref 20–29)
Calcium: 9.6 mg/dL (ref 8.6–10.2)
Chloride: 101 mmol/L (ref 96–106)
Creatinine, Ser: 1.05 mg/dL (ref 0.76–1.27)
GFR calc Af Amer: 80 mL/min/{1.73_m2} (ref >59)
GFR calc non Af Amer: 69 mL/min/{1.73_m2} (ref >59)
Globulin, Total: 1.9 g/dL (ref 1.5–4.5)
Glucose: 159 mg/dL — ABNORMAL HIGH (ref 65–99)
Potassium: 3.9 mmol/L (ref 3.5–5.2)
Sodium: 140 mmol/L (ref 134–144)
Total Protein: 6.2 g/dL (ref 6.0–8.5)

## 2019-04-07 LAB — LIPID PANEL
Chol/HDL Ratio: 3 ratio (ref 0.0–5.0)
Cholesterol, Total: 101 mg/dL (ref 100–199)
HDL: 34 mg/dL — ABNORMAL LOW (ref >39)
LDL Chol Calc (NIH): 52 mg/dL (ref 0–99)
Triglycerides: 67 mg/dL (ref 0–149)
VLDL Cholesterol Cal: 15 mg/dL (ref 5–40)

## 2019-04-09 ENCOUNTER — Telehealth: Payer: Self-pay | Admitting: Family Medicine

## 2019-04-09 MED ORDER — GLUCOSE BLOOD VI STRP: 1.0000 | ORAL_STRIP | Freq: Four times a day (QID) | 12 refills | Status: DC

## 2019-04-09 NOTE — Telephone Encounter (Signed)
Test Strips sent to pharmacy

## 2019-04-10 LAB — TOXASSURE SELECT 13 (MW), URINE

## 2019-04-13 ENCOUNTER — Other Ambulatory Visit: Payer: Self-pay | Admitting: Family Medicine

## 2019-04-13 ENCOUNTER — Telehealth: Payer: Self-pay | Admitting: Family Medicine

## 2019-04-13 MED ORDER — GLUCOSE BLOOD VI STRP: 1.0000 | ORAL_STRIP | Freq: Four times a day (QID) | 12 refills | Status: DC

## 2019-04-13 NOTE — Progress Notes (Signed)
Printed a prescription for test strips

## 2019-05-01 ENCOUNTER — Telehealth: Payer: Self-pay | Admitting: *Deleted

## 2019-05-01 NOTE — Telephone Encounter (Signed)
A message was left for George Wang to call and schedule his follow up visit, this was a mistake the recall was wrong.He is due back in 2021.

## 2019-06-01 ENCOUNTER — Telehealth: Payer: Self-pay | Admitting: Family Medicine

## 2019-06-01 NOTE — Chronic Care Management (AMB) (Signed)
Chronic Care Management   Note  06/01/2019 Name: [name] MRN: [id number] DOB: February 08, 1946  [name] is a 73 y.o. year old male who is a primary care patient of Dettinger, Fransisca Kaufmann, MD. I reached out to George Wang by phone today in response to a referral sent by George Wang's health plan.     George Wang was given information about Chronic Care Management services today including:  1. CCM service includes personalized support from designated clinical staff supervised by his physician, including individualized plan of care and coordination with other care providers 2. 24/7 contact phone numbers for assistance for urgent and routine care needs. 3. Service will only be billed when office clinical staff spend 20 minutes or more in a month to coordinate care. 4. Only one practitioner may furnish and bill the service in a calendar month. 5. The patient may stop CCM services at any time (effective at the end of the month) by phone call to the office staff. 6. The patient will be responsible for cost sharing (co-pay) of up to 20% of the service fee (after annual deductible is met).  Patient agreed to services and verbal consent obtained.   Follow up plan: Telephone appointment with CCM team member scheduled for: 06/12/2019  [facility]  [place], [place] [address] Direct Dial: [email or web address]  Website: [email or web address]

## 2019-06-12 ENCOUNTER — Ambulatory Visit: Payer: Medicare Other | Admitting: *Deleted

## 2019-06-12 DIAGNOSIS — E1169 Type 2 diabetes mellitus with other specified complication: Secondary | ICD-10-CM

## 2019-06-12 DIAGNOSIS — I10 Essential (primary) hypertension: Secondary | ICD-10-CM

## 2019-06-12 NOTE — Patient Instructions (Addendum)
George Wang was given information about Chronic Care Management services today including:  1. CCM service includes personalized support from designated clinical staff supervised by his physician, including individualized plan of care and coordination with other care providers 2. 24/7 contact phone numbers for assistance for urgent and routine care needs. 3. Service will only be billed when office clinical staff spend 20 minutes or more in a month to coordinate care. 4. Only one practitioner may furnish and bill the service in a calendar month. 5. The patient may stop CCM services at any time (effective at the end of the month) by phone call to the office staff. 6. The patient will be responsible for cost sharing (co-pay) of up to 20% of the service fee (after annual deductible is met).  Patient agreed to services and verbal consent obtained.    Follow Up Plan:  I'll be happy to call [name] back within the next 7 days and setup a time for the following day to call for his Initial Visit.   Chong Sicilian, BSN, RN-BC Embedded Chronic Care Manager [facility] Direct Dial: [phone]

## 2019-06-12 NOTE — Chronic Care Management (AMB) (Signed)
  Chronic Care Management   Outreach Note  06/12/2019 Name: George Wang MRN: [id number] DOB: 29-Mar-1946  Referred by: Dettinger, Fransisca Kaufmann, MD Reason for referral : Chronic Care Management (RN Initial Outreach)  I reached out to George Wang by telephone today and was able to talk with him. He was tied up at work though and asked that I give him a call back next week to setup a time for his Initial CCM Visit. He states that he does better with a reminder call the day prior to an appointment.   Follow Up Plan:  I'll be happy to call George Wang back within the next 7 days and setup a time for the following day to call for his Initial Visit.   Chong Sicilian, BSN, RN-BC Embedded Chronic Care Manager [facility] Direct Dial: [phone]

## 2019-06-13 ENCOUNTER — Other Ambulatory Visit: Payer: Self-pay | Admitting: Family Medicine

## 2019-07-01 ENCOUNTER — Other Ambulatory Visit: Payer: Self-pay | Admitting: Family Medicine

## 2019-07-06 ENCOUNTER — Ambulatory Visit (INDEPENDENT_AMBULATORY_CARE_PROVIDER_SITE_OTHER): Payer: Medicare Other | Admitting: *Deleted

## 2019-07-06 DIAGNOSIS — Z0000 Encounter for general adult medical examination without abnormal findings: Secondary | ICD-10-CM

## 2019-07-06 NOTE — Progress Notes (Signed)
MEDICARE ANNUAL WELLNESS VISIT  07/06/2019  Telephone Visit Disclaimer This Medicare AWV was conducted by telephone due to national recommendations for restrictions regarding the COVID-19 Pandemic (e.g. social distancing).  I verified, using two identifiers, that I am speaking with George Wang or their authorized healthcare agent. I discussed the limitations, risks, security, and privacy concerns of performing an evaluation and management service by telephone and the potential availability of an in-person appointment in the future. The patient expressed understanding and agreed to proceed.   Subjective:  George Wang is a 73 y.o. male patient of Dettinger, Fransisca Kaufmann, MD who had a Medicare Annual Wellness Visit today via telephone. 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 he has 1 living child and one that passed away. he reports that he is socially active and does interact with friends/family regularly. he is moderately physically active and enjoys yard work, listening to music and tinkering in his shop.  Patient Care Team: Dettinger, Fransisca Kaufmann, MD as PCP - General (Family Medicine) Minus Breeding, MD as PCP - Cardiology (Cardiology) Druscilla Brownie, MD (Dermatology) Irine Seal, MD (Urology) Minus Breeding, MD (Cardiology) Pyrtle, Lajuan Lines, MD as Consulting Physician (Gastroenterology) Ilean China, RN as Registered Nurse  Advanced Directives 07/06/2019 07/13/2018 06/20/2018 05/30/2018 05/16/2017  Does Patient Have a Medical Advance Directive? Yes No No No No  Type of Advance Directive Living will;Healthcare Power of Attorney - - - -  Does patient want to make changes to medical advance directive? No - Patient declined - - - -  Copy of [place] in Chart? No - copy requested - - - -  Would patient like information on creating a medical advance directive? - - - Yes (MAU/Ambulatory/Procedural Areas  - Information given) Yes (MAU/Ambulatory/Procedural Areas - Information given)    Hospital Utilization Over the Past 12 Months: # of hospitalizations or ER visits: 1 # of surgeries: 0  Review of Systems    Patient reports that his overall health is unchanged compared to last year.  History obtained from chart review  Patient Reported Readings (BP, Pulse, CBG, Weight, etc) none  Pain Assessment Pain : 0-10 Pain Score: 3  Pain Type: Chronic pain Pain Location: Back Pain Orientation: Other (Comment)(neck and shoulders) Pain Descriptors / Indicators: Aching Pain Onset: Other (comment)(years) Pain Frequency: Constant Pain Relieving Factors: Rest, tylenol Effect of Pain on Daily Activities: slows him down a little bit and takes frequent breaks  Pain Relieving Factors: Rest, tylenol  Current Medications & Allergies (verified) Allergies as of 07/06/2019   No Known Allergies     Medication List       Accurate as of July 06, 2019 11:00 AM. If you have any questions, ask your nurse or doctor.        STOP taking these medications   PARoxetine 40 MG tablet Commonly known as: PAXIL     TAKE these medications   acetaminophen 325 MG tablet Commonly known as: TYLENOL Take 650 mg by mouth every 6 (six) hours as needed for mild pain.   amLODipine 10 MG tablet Commonly known as: NORVASC Take 1 tablet (10 mg total) by mouth daily. Needs to be seen for further refills.   atorvastatin 80 MG tablet Commonly known as: LIPITOR TAKE ONE (1) TABLET EACH DAY What changed: See the new instructions.   beta carotene 25000 UNIT capsule Take 25,000 Units by mouth daily.  blood glucose meter kit and supplies Dispense based on patient and insurance preference. Use up to four times daily as directed. (FOR ICD-10 E10.9, E11.9).   CENTRUM SILVER PO Take 1 tablet by mouth daily.   chlorthalidone 25 MG tablet Commonly known as: HYGROTON TAKE 1 TABLET DAILY FOR BLOOD PRESSURE     famotidine 20 MG tablet Commonly known as: Pepcid Take 1 tablet (20 mg total) by mouth 2 (two) times daily as needed for heartburn or indigestion.   Fiber Powd Take 1 Scoop by mouth daily as needed.   fish oil-omega-3 fatty acids 1000 MG capsule Take 1 g by mouth 2 (two) times daily.   glimepiride 4 MG tablet Commonly known as: AMARYL TAKE 1/2 TABLET TWICE DAILY   glucose blood test strip 1 each by Other route 4 (four) times daily.   losartan 100 MG tablet Commonly known as: COZAAR Take 1 tablet (100 mg total) by mouth daily.   metFORMIN 500 MG tablet Commonly known as: GLUCOPHAGE TAKE 2 TABLETS EACH MORNING AND 1 TABLETEACH EVENING.  Needs to be seen for further refills.   omeprazole 20 MG capsule Commonly known as: PRILOSEC TAKE ONE (1) CAPSULE EACH DAY   OT ULTRA/FASTTK CNTRL SOLN Soln Use with meter as directed Dx E11.9   Se-Tan PLUS 162-115.2-1 MG Caps TAKE ONE CAPSULE BY MOUTH TWICE A DAY   traMADol 50 MG tablet Commonly known as: ULTRAM Take 1 tablet (50 mg total) by mouth every 6 (six) hours as needed for moderate pain.   Vitamin D 50 MCG (2000 UT) Caps Take 2 capsules by mouth daily.       History (reviewed): Past Medical History:  Diagnosis Date  . Allergy    mild  . Anemia    Iron deficiency  . Anxiety state, unspecified   . Aortic atherosclerosis ([place])   . Arthritis   . Calculus of kidney    kidney stones  - normal BMP   . Cancer [facility])    Prostate, skin cancer x 2   . Cataract    forming both eyes  . Depressive disorder, not elsewhere classified   . Diverticulosis of colon (without mention of hemorrhage)   . GERD (gastroesophageal reflux disease)    on nexium  . Gout, unspecified   . Heart murmur   . Hyperlipidemia   . Hypertension, essential   . Irritable bowel syndrome   . Neuromuscular disorder (HCC)    numbness in knees from back issues   . Personal history of malignant neoplasm of prostate   . PVC (premature ventricular  contraction)   . Type II or unspecified type diabetes mellitus without mention of complication, not stated as uncontrolled    Past Surgical History:  Procedure Laterality Date  . COLONOSCOPY    . HERNIA REPAIR     left side  . PROSTATE SURGERY    . skin cancer removed     chest x 2 3-4 yrs ago   . TONSILLECTOMY     Family History  Problem Relation Age of Onset  . Cancer Maternal Aunt        breast  . Pancreatic cancer Paternal Aunt   . Prostate cancer Paternal Uncle   . Heart disease Maternal Grandmother 69  . Heart disease Maternal Grandfather        fluid / CHF   . Heart disease Paternal Grandfather 24  . Heart attack Paternal Grandfather        passed at 32  .  Prostate cancer Father   . Coronary artery disease Father 56       CABG  . Hypertension Mother   . Hyperlipidemia Mother   . Other Daughter        accident with tornado 1996-10-04  . Hypertension Son   . Colon cancer Neg Hx   . Colon polyps Neg Hx   . Esophageal cancer Neg Hx   . Rectal cancer Neg Hx   . Stomach cancer Neg Hx    Social History   Socioeconomic History  . Marital status: Married    Spouse name: [name]  . Number of children: 2  . Years of education: 89  . Highest education level: Some college, no degree  Occupational History  . Occupation: Production designer, theatre/television/film    Comment: Part time  Tobacco Use  . Smoking status: Former Smoker    Packs/day: 1.00    Years: 19.00    Pack years: 19.00    Types: Cigarettes    Quit date: 10/13/1978    Years since quitting: 40.7  . Smokeless tobacco: Former Systems developer    Types: Snuff    Quit date: 05/30/2008  Substance and Sexual Activity  . Alcohol use: Not Currently  . Drug use: No  . Sexual activity: Yes  Other Topics Concern  . Not on file  Social History Narrative   Lives at home with wife.  He has one son and a daughter that died in a tornado in 15. 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 He has one living grandson and one grandson  that passed away in 10-04-16 at age 71 from cancer.   Social Determinants of Health   Financial Resource Strain: Low Risk   . Difficulty of Paying Living Expenses: Not hard at all  Food Insecurity: No Food Insecurity  . Worried About Charity fundraiser in the Last Year: Never true  . Ran Out of Food in the Last Year: Never true  Transportation Needs: No Transportation Needs  . Lack of Transportation (Medical): No  . Lack of Transportation (Non-Medical): No  Physical Activity: Inactive  . Days of Exercise per Week: 0 days  . Minutes of Exercise per Session: 0 min  Stress: No Stress Concern Present  . Feeling of Stress : Only a little  Social Connections: Not Isolated  . Frequency of Communication with Friends and Family: More than three times a week  . Frequency of Social Gatherings with Friends and Family: More than three times a week  . Attends Religious Services: More than 4 times per year  . Active Member of Clubs or Organizations: Yes  . Attends Archivist Meetings: More than 4 times per year  . Marital Status: Married    Activities of Daily Living In your present state of health, do you have any difficulty performing the following activities: 07/06/2019  Hearing? 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 N  Comment wears glasses all the time-gets yearly eye exam-does have cataracts but they don't need to be removed yet  Difficulty concentrating or making decisions? Y  Comment has trouble remembering names  Walking or climbing stairs? N  Dressing or bathing? N  Doing errands, shopping? N  Preparing Food and eating ? N  Using the Toilet? N  In the past six months, have you accidently leaked urine? Y  Comment dribbles a little bit after prostate surgery  Do you  have problems with loss of bowel control? N  Managing your Medications? N  Managing your Finances? N  Housekeeping or managing your Housekeeping? N  Some recent  data might be hidden    Patient Education/ Literacy How often do you need to have someone help you when you read instructions, pamphlets, or other written materials from your doctor or pharmacy?: 1 - Never What is the last grade level you completed in school?: Some college-no degree  Exercise Current Exercise Habits: The patient has a physically strenuous job, but has no regular exercise apart from work., Exercise limited by: orthopedic condition(s)  Diet Patient reports consuming 2 meals a day and 2 snack(s) a day Patient reports that his primary diet is: Regular Patient reports that she does have regular access to food.   Depression Screen PHQ 2/9 Scores 07/06/2019 04/06/2019 01/17/2019 01/03/2019 08/01/2018 07/03/2018 02/14/2018  PHQ - 2 Score 0 0 0 0 1 2 5   PHQ- 9 Score - - - - - 4 10     Fall Risk Fall Risk  07/06/2019 04/06/2019 01/17/2019 01/03/2019 08/01/2018  Falls in the past year? 1 0 0 0 1  Comment - - - - -  Number falls in past yr: 1 - - - 0  Injury with Fall? 0 - - - 1  Risk for fall due to : - - - - -  Risk for fall due to: Comment - - - - -  Follow up Falls prevention discussed - - - -  Comment Get rid of all throw rugs in the house, adequate lighting in the walkways and grab bars in the bathroom - - - -     Objective:  George Wang seemed alert and oriented and he participated appropriately during our telephone visit.  Blood Pressure Weight BMI  BP Readings from Last 3 Encounters:  04/06/19 (!) 124/54  01/17/19 (!) 124/47  01/03/19 (!) 210/110   Wt Readings from Last 3 Encounters:  04/06/19 184 lb 3.2 oz (83.6 kg)  01/17/19 186 lb (84.4 kg)  01/03/19 188 lb (85.3 kg)   BMI Readings from Last 1 Encounters:  04/06/19 33.69 kg/m    *Unable to obtain current vital signs, weight, and BMI due to telephone visit type  Hearing/Vision  . George Wang did not seem to have difficulty with hearing/understanding during the telephone conversation . Reports that he has not had  a formal eye exam by an eye care professional within the past year . Reports that he has had a formal hearing evaluation within the past year *Unable to fully assess hearing and vision during telephone visit type  Cognitive Function: 6CIT Screen 07/06/2019 05/30/2018  What Year? 0 points 0 points  What month? 0 points 0 points  What time? 0 points 0 points  Count back from 20 0 points 0 points  Months in reverse 0 points 0 points  Repeat phrase 0 points 0 points  Total Score 0 0   (Normal:0-7, Significant for Dysfunction: >8)  Normal Cognitive Function Screening: Yes   Immunization & Health Maintenance Record Immunization History  Administered Date(s) Administered  . Fluad Quad(high Dose 65+) 04/06/2019  . H1N1 06/25/2008  . Influenza, High Dose Seasonal PF 04/14/2016, 05/09/2017, 05/02/2018  . Influenza,inj,Quad PF,6+ Mos 05/14/2013, 05/02/2014, 04/18/2015  . Influenza-Unspecified 04/14/2009, 05/06/2010, 05/20/2011, 05/02/2012  . Pneumococcal Conjugate-13 06/19/2013, 05/02/2014  . Pneumococcal Polysaccharide-23 01/15/2010  . Td 05/20/2011  . Tdap 05/20/2011  . Zoster 01/17/2007    Health Maintenance  Topic Date  Due  . Hepatitis C Screening  03/31/1946  . OPHTHALMOLOGY EXAM  06/03/2019  . COLON CANCER SCREENING ANNUAL FOBT  06/21/2019  . FOOT EXAM  07/04/2019  . HEMOGLOBIN A1C  10/04/2019  . TETANUS/TDAP  05/19/2021  . COLONOSCOPY  06/20/2021  . INFLUENZA VACCINE  Completed  . PNA vac Low Risk Adult  Completed       Assessment  This is a routine wellness examination for George Wang.  Health Maintenance: Due or Overdue Health Maintenance Due  Topic Date Due  . Hepatitis C Screening  06-24-1946  . OPHTHALMOLOGY EXAM  06/03/2019  . COLON CANCER SCREENING ANNUAL FOBT  06/21/2019  . FOOT EXAM  07/04/2019    George Wang does not need a referral for Community Assistance: Care Management:   no Social Work:    no Prescription  Assistance:  no Nutrition/Diabetes Education:  no   Plan:  Personalized Goals Goals Addressed            This Visit's Progress   . DIET - INCREASE WATER INTAKE       Try to drink 6-8 glasses of water daily.      Personalized Health Maintenance & Screening Recommendations  Diabetic Eye Exam  Lung Cancer Screening Recommended: no (Low Dose CT Chest recommended if Age 56-80 years, 30 pack-year currently smoking OR have quit w/in past 15 years) Hepatitis C Screening recommended: no HIV Screening recommended: no  Advanced Directives: Written information was not prepared per patient's request.  Referrals & Orders No orders of the defined types were placed in this encounter.   Follow-up Plan . Follow-up with Dettinger, Fransisca Kaufmann, MD as planned . Schedule your Diabetic Eye Exam as discussed . Bring a copy of your Healthcare POA in for our records-we have a copy of your Living Will   I have personally reviewed and noted the following in the patient's chart:   . Medical and social history . Use of alcohol, tobacco or illicit drugs  . Current medications and supplements . Functional ability and status . Nutritional status . Physical activity . Advanced directives . List of other physicians . Hospitalizations, surgeries, and ER visits in previous 12 months . Vitals . Screenings to include cognitive, depression, and falls . Referrals and appointments  In addition, I have reviewed and discussed with George Wang certain preventive protocols, quality metrics, and best practice recommendations. A written personalized care plan for preventive services as well as general preventive health recommendations is available and can be mailed to the patient at his request.      Milas Hock, LPN  [phone]

## 2019-07-06 NOTE — Patient Instructions (Signed)

## 2019-07-10 ENCOUNTER — Encounter: Payer: Self-pay | Admitting: Urology

## 2019-07-17 ENCOUNTER — Other Ambulatory Visit: Payer: Self-pay | Admitting: Family Medicine

## 2019-07-30 ENCOUNTER — Other Ambulatory Visit: Payer: Self-pay | Admitting: Family Medicine

## 2019-08-03 ENCOUNTER — Other Ambulatory Visit: Payer: Self-pay

## 2019-08-06 ENCOUNTER — Ambulatory Visit (INDEPENDENT_AMBULATORY_CARE_PROVIDER_SITE_OTHER): Payer: Medicare Other | Admitting: Family Medicine

## 2019-08-06 ENCOUNTER — Other Ambulatory Visit: Payer: Self-pay

## 2019-08-06 ENCOUNTER — Encounter: Payer: Self-pay | Admitting: Family Medicine

## 2019-08-06 VITALS — BP 126/67 | HR 57 | Temp 97.1°F | Ht 62.0 in | Wt 183.2 lb

## 2019-08-06 DIAGNOSIS — E785 Hyperlipidemia, unspecified: Secondary | ICD-10-CM | POA: Diagnosis not present

## 2019-08-06 DIAGNOSIS — I10 Essential (primary) hypertension: Secondary | ICD-10-CM

## 2019-08-06 DIAGNOSIS — Z1159 Encounter for screening for other viral diseases: Secondary | ICD-10-CM

## 2019-08-06 DIAGNOSIS — E1169 Type 2 diabetes mellitus with other specified complication: Secondary | ICD-10-CM | POA: Diagnosis not present

## 2019-08-06 DIAGNOSIS — E782 Mixed hyperlipidemia: Secondary | ICD-10-CM

## 2019-08-06 LAB — BAYER DCA HB A1C WAIVED: HB A1C (BAYER DCA - WAIVED): 6.6 % (ref <7.0)

## 2019-08-06 MED ORDER — AMLODIPINE BESYLATE 10 MG PO TABS: 10.0000 mg | ORAL_TABLET | Freq: Every day | ORAL | 3 refills | Status: DC

## 2019-08-06 MED ORDER — FAMOTIDINE 20 MG PO TABS: 20.0000 mg | ORAL_TABLET | Freq: Two times a day (BID) | ORAL | 3 refills | Status: DC | PRN

## 2019-08-06 MED ORDER — METFORMIN HCL 500 MG PO TABS: ORAL_TABLET | ORAL | 3 refills | Status: DC

## 2019-08-06 MED ORDER — SE-TAN PLUS 162-115.2-1 MG PO CAPS: 1.0000 | ORAL_CAPSULE | Freq: Two times a day (BID) | ORAL | 3 refills | Status: DC

## 2019-08-06 MED ORDER — GLIMEPIRIDE 4 MG PO TABS: 2.0000 mg | ORAL_TABLET | Freq: Two times a day (BID) | ORAL | 3 refills | Status: DC

## 2019-08-06 MED ORDER — OMEPRAZOLE 20 MG PO CPDR: DELAYED_RELEASE_CAPSULE | ORAL | 3 refills | Status: DC

## 2019-08-06 MED ORDER — ATORVASTATIN CALCIUM 80 MG PO TABS: 40.0000 mg | ORAL_TABLET | Freq: Every day | ORAL | 3 refills | Status: DC

## 2019-08-06 NOTE — Progress Notes (Signed)
BP 126/67   Pulse (!) 57   Temp (!) 97.1 F (36.2 C) (Temporal)   Ht 5' 2"  (1.575 m)   Wt 183 lb 3.2 oz (83.1 kg)   SpO2 99%   BMI 33.51 kg/m    Subjective:   Patient ID: George Wang, male    DOB: 01-08-1946, 74 y.o.   MRN: [id number]  HPI: George Wang is a 74 y.o. male presenting on 08/06/2019 for Diabetes (4 month followup) and Hypertension   HPI Type 2 diabetes mellitus Patient comes in today for recheck of his diabetes. Patient has been currently taking glimepiride and Metformin, A1c is 6.6. Patient is currently on an ACE inhibitor/ARB. Patient has not seen an ophthalmologist this year. Patient denies any issues with their feet.   Hypertension Patient is currently on fish oil and atorvastatin, and their blood pressure today is 126/67. Patient denies any lightheadedness or dizziness. Patient denies headaches, blurred vision, chest pains, shortness of breath, or weakness. Denies any side effects from medication and is content with current medication.   Hyperlipidemia Patient is coming in for recheck of his hyperlipidemia. The patient is currently taking atorvastatin fish oil. They deny any issues with myalgias or history of liver damage from it. They deny any focal numbness or weakness or chest pain.   Relevant past medical, surgical, family and social history reviewed and updated as indicated. Interim medical history since our last visit reviewed. Allergies and medications reviewed and updated.  Review of Systems  Constitutional: Negative for chills and fever.  Eyes: Negative for visual disturbance.  Respiratory: Negative for shortness of breath and wheezing.   Cardiovascular: Negative for chest pain and leg swelling.  Musculoskeletal: Negative for back pain and gait problem.  Skin: Negative for rash.  Neurological: Negative for dizziness, weakness and headaches.  All other systems reviewed and are negative.   Per HPI unless specifically indicated  above   Allergies as of 08/06/2019   No Known Allergies     Medication List       Accurate as of August 06, 2019 10:08 AM. If you have any questions, ask your nurse or doctor.        acetaminophen 325 MG tablet Commonly known as: TYLENOL Take 650 mg by mouth every 6 (six) hours as needed for mild pain.   amLODipine 10 MG tablet Commonly known as: NORVASC Take 1 tablet (10 mg total) by mouth daily. What changed: additional instructions Changed by: Fransisca Kaufmann Odelia Graciano, MD   atorvastatin 80 MG tablet Commonly known as: LIPITOR Take 0.5 tablets (40 mg total) by mouth daily at 6 PM. What changed: See the new instructions. Changed by: Fransisca Kaufmann Mariane Burpee, MD   beta carotene 25000 UNIT capsule Take 25,000 Units by mouth daily.   blood glucose meter kit and supplies Dispense based on patient and insurance preference. Use up to four times daily as directed. (FOR ICD-10 E10.9, E11.9).   CENTRUM SILVER PO Take 1 tablet by mouth daily.   chlorthalidone 25 MG tablet Commonly known as: HYGROTON TAKE 1 TABLET DAILY FOR BLOOD PRESSURE   famotidine 20 MG tablet Commonly known as: Pepcid Take 1 tablet (20 mg total) by mouth 2 (two) times daily as needed for heartburn or indigestion.   Fiber Powd Take 1 Scoop by mouth daily as needed.   fish oil-omega-3 fatty acids 1000 MG capsule Take 1 g by mouth 2 (two) times daily.   glimepiride 4 MG tablet Commonly known as: AMARYL Take 0.5  tablets (2 mg total) by mouth 2 (two) times daily.   glucose blood test strip 1 each by Other route 4 (four) times daily.   losartan 100 MG tablet Commonly known as: COZAAR Take 1 tablet (100 mg total) by mouth daily.   metFORMIN 500 MG tablet Commonly known as: GLUCOPHAGE TAKE 1 TABLET EACH MORNING AND 1 TABLETEACH EVENING.   omeprazole 20 MG capsule Commonly known as: PRILOSEC TAKE ONE (1) CAPSULE EACH DAY   OT ULTRA/FASTTK CNTRL SOLN Soln Use with meter as directed Dx E11.9   Se-Tan  PLUS 162-115.2-1 MG Caps Take 1 capsule by mouth 2 (two) times daily.   traMADol 50 MG tablet Commonly known as: ULTRAM Take 1 tablet (50 mg total) by mouth every 6 (six) hours as needed for moderate pain.   Vitamin D 50 MCG (2000 UT) Caps Take 2 capsules by mouth daily.        Objective:   BP 126/67   Pulse (!) 57   Temp (!) 97.1 F (36.2 C) (Temporal)   Ht 5' 2"  (1.575 m)   Wt 183 lb 3.2 oz (83.1 kg)   SpO2 99%   BMI 33.51 kg/m   Wt Readings from Last 3 Encounters:  08/06/19 183 lb 3.2 oz (83.1 kg)  04/06/19 184 lb 3.2 oz (83.6 kg)  01/17/19 186 lb (84.4 kg)    Physical Exam Vitals and nursing note reviewed.  Constitutional:      General: He is not in acute distress.    Appearance: He is well-developed. He is not diaphoretic.  Eyes:     General: No scleral icterus.    Conjunctiva/sclera: Conjunctivae normal.  Neck:     Thyroid: No thyromegaly.  Cardiovascular:     Rate and Rhythm: Normal rate and regular rhythm.     Heart sounds: Normal heart sounds. No murmur.  Pulmonary:     Effort: Pulmonary effort is normal. No respiratory distress.     Breath sounds: Normal breath sounds. No wheezing.  Musculoskeletal:        General: Normal range of motion.     Cervical back: Neck supple.  Lymphadenopathy:     Cervical: No cervical adenopathy.  Skin:    General: Skin is warm and dry.     Findings: No rash.  Neurological:     Mental Status: He is alert and oriented to person, place, and time.     Coordination: Coordination normal.  Psychiatric:        Behavior: Behavior normal.       Assessment & Plan:   Problem List Items Addressed This Visit      Cardiovascular and Mediastinum   Essential hypertension   Relevant Medications   amLODipine (NORVASC) 10 MG tablet   atorvastatin (LIPITOR) 80 MG tablet     Endocrine   Type 2 diabetes mellitus (HCC) - Primary   Relevant Medications   atorvastatin (LIPITOR) 80 MG tablet   glimepiride (AMARYL) 4 MG tablet    metFORMIN (GLUCOPHAGE) 500 MG tablet   Other Relevant Orders   hgba1c     Other   Hyperlipemia   Relevant Medications   amLODipine (NORVASC) 10 MG tablet   atorvastatin (LIPITOR) 80 MG tablet   RESOLVED: Dyslipidemia   Relevant Medications   atorvastatin (LIPITOR) 80 MG tablet    Other Visit Diagnoses    Need for hepatitis C screening test       Relevant Orders   Hepatitis C Antibody      Continue  medication, no changes for now A1c 6.6, refocus on diet  Patient does complain of some pain going down from his neck to his shoulder down to his elbow on both arms, he does have a neurosurgeon already and he will go talk to them.  Patient usually gets a PSA with Dr. Jeffie Pollock Follow up plan: Return in about 4 months (around 12/04/2019), or if symptoms worsen or fail to improve, for Diabetes and hypertension recheck.  Counseling provided for all of the vaccine components Orders Placed This Encounter  Procedures  . hgba1c  . Hepatitis C Antibody    Caryl Pina, MD [facility] 08/06/2019, 10:08 AM

## 2019-08-07 LAB — HEPATITIS C ANTIBODY: Hep C Virus Ab: <0.1 s/co ratio (ref 0.0–0.9)

## 2019-08-31 ENCOUNTER — Ambulatory Visit (INDEPENDENT_AMBULATORY_CARE_PROVIDER_SITE_OTHER): Payer: Medicare Other | Admitting: Urology

## 2019-08-31 ENCOUNTER — Encounter: Payer: Self-pay | Admitting: Urology

## 2019-08-31 ENCOUNTER — Other Ambulatory Visit: Payer: Self-pay

## 2019-08-31 VITALS — BP 152/56 | HR 41 | Temp 97.3°F | Ht 63.0 in | Wt 181.0 lb

## 2019-08-31 DIAGNOSIS — Z87442 Personal history of urinary calculi: Secondary | ICD-10-CM | POA: Diagnosis not present

## 2019-08-31 DIAGNOSIS — N393 Stress incontinence (female) (male): Secondary | ICD-10-CM

## 2019-08-31 DIAGNOSIS — E291 Testicular hypofunction: Secondary | ICD-10-CM

## 2019-08-31 DIAGNOSIS — Z8546 Personal history of malignant neoplasm of prostate: Secondary | ICD-10-CM | POA: Diagnosis not present

## 2019-08-31 LAB — POCT URINALYSIS DIPSTICK
Blood, UA: NEGATIVE
Glucose, UA: NEGATIVE
Leukocytes, UA: NEGATIVE
Nitrite, UA: NEGATIVE
Protein, UA: NEGATIVE
Spec Grav, UA: 1.025 (ref 1.010–1.025)
Urobilinogen, UA: NEGATIVE E.U./dL — AB
pH, UA: 5 (ref 5.0–8.0)

## 2019-08-31 NOTE — Progress Notes (Signed)
Subjective:  1. PROSTATE CANCER, HX OF   2. History of urinary stone   3. Stress incontinence of urine   4. Hypogonadism in male      George Wang is a 74 year-old male established patient who is here for his history of prostate cancer  His most recent PSA is <0.1.  08/06/19  Yang returns in f/u for his history of prostate cancer treated in 09/2003 with radical prostatectomy. His PSA's have been undetectible. The PSA prior to this visit was <0.1. He is voiding well with minimal incontinence which has not changed. he wears a guard. He has some nocturia x 1-3. He has persistant ED. He is not on treatment. He has hypogonadism with a testosterone of 51 in 11/15 but didn't elect to have treatment. He has a history of stones but has had no hematuria or flank pain. He has no associated signs or symptoms.   IPSS    Row Name 08/31/19 1400         International Prostate Symptom Score   How often have you had the sensation of not emptying your bladder?  Not at All     How often have you had to urinate less than every two hours?  Less than 1 in 5 times     How often have you found you stopped and started again several times when you urinated?  Less than 1 in 5 times     How often have you found it difficult to postpone urination?  Not at All     How often have you had a weak urinary stream?  Not at All     How often have you had to strain to start urination?  Not at All     How many times did you typically get up at night to urinate?  2 Times     Total IPSS Score  4       Quality of Life due to urinary symptoms   If you were to spend the rest of your life with your urinary condition just the way it is now how would you feel about that?  Delighted         ROS:  ROS:  A complete review of systems was performed.  All systems are negative except for pertinent findings as noted.   Review of Systems  Cardiovascular: Positive for leg swelling (mild).  All other systems reviewed and are  negative.   No Known Allergies  Outpatient Encounter Medications as of 08/31/2019  Medication Sig  . acetaminophen (TYLENOL) 325 MG tablet Take 650 mg by mouth every 6 (six) hours as needed for mild pain.  Marland Kitchen amLODipine (NORVASC) 10 MG tablet Take 1 tablet (10 mg total) by mouth daily.  Marland Kitchen atorvastatin (LIPITOR) 80 MG tablet Take 0.5 tablets (40 mg total) by mouth daily at 6 PM.  . beta carotene 25000 UNIT capsule Take 25,000 Units by mouth daily.  . Blood Glucose Calibration (OT ULTRA/FASTTK CNTRL SOLN) SOLN Use with meter as directed Dx E11.9  . blood glucose meter kit and supplies Dispense based on patient and insurance preference. Use up to four times daily as directed. (FOR ICD-10 E10.9, E11.9).  . chlorthalidone (HYGROTON) 25 MG tablet TAKE 1 TABLET DAILY FOR BLOOD PRESSURE  . Cholecalciferol (VITAMIN D) 2000 UNITS CAPS Take 2 capsules by mouth daily.   . famotidine (PEPCID) 20 MG tablet Take 1 tablet (20 mg total) by mouth 2 (two) times daily as needed for heartburn  or indigestion.  . FeFum-FePo-FA-B Cmp-C-Zn-Mn-Cu (SE-TAN PLUS) 162-115.2-1 MG CAPS Take 1 capsule by mouth 2 (two) times daily.  . Fiber POWD Take 1 Scoop by mouth daily as needed.   . fish oil-omega-3 fatty acids 1000 MG capsule Take 1 g by mouth 2 (two) times daily.   Marland Kitchen glimepiride (AMARYL) 4 MG tablet Take 0.5 tablets (2 mg total) by mouth 2 (two) times daily.  Marland Kitchen glucose blood test strip 1 each by Other route 4 (four) times daily.  Marland Kitchen losartan (COZAAR) 100 MG tablet Take 1 tablet (100 mg total) by mouth daily.  . metFORMIN (GLUCOPHAGE) 500 MG tablet TAKE 1 TABLET EACH MORNING AND 1 TABLETEACH EVENING.  . Multiple Vitamins-Minerals (CENTRUM SILVER PO) Take 1 tablet by mouth daily.   Marland Kitchen omeprazole (PRILOSEC) 20 MG capsule TAKE ONE (1) CAPSULE EACH DAY  . traMADol (ULTRAM) 50 MG tablet Take 1 tablet (50 mg total) by mouth every 6 (six) hours as needed for moderate pain.  . [DISCONTINUED] febuxostat (ULORIC) 40 MG tablet Take  80 mg by mouth daily.  . [DISCONTINUED] simvastatin (ZOCOR) 80 MG tablet Take 80 mg by mouth at bedtime.   No facility-administered encounter medications on file as of 08/31/2019.    Past Medical History:  Diagnosis Date  . Allergy    mild  . Anemia    Iron deficiency  . Anxiety state, unspecified   . Aortic atherosclerosis ([place])   . Arthritis   . Calculus of kidney    kidney stones  - normal BMP   . Cancer [facility])    Prostate, skin cancer x 2   . Cataract    forming both eyes  . Depressive disorder, not elsewhere classified   . Diverticulosis of colon (without mention of hemorrhage)   . GERD (gastroesophageal reflux disease)    on nexium  . Gout, unspecified   . Heart murmur   . Hyperlipidemia   . Hypertension, essential   . Irritable bowel syndrome   . Neuromuscular disorder ([facility])    numbness in knees from back issues   . Personal history of malignant neoplasm of prostate   . PVC (premature ventricular contraction)   . Type II or unspecified type diabetes mellitus without mention of complication, not stated as uncontrolled     Past Surgical History:  Procedure Laterality Date  . COLONOSCOPY    . HERNIA REPAIR     left side  . PROSTATE SURGERY    . skin cancer removed     chest x 2 3-4 yrs ago   . TONSILLECTOMY      Social History   Socioeconomic History  . Marital status: Married    Spouse name: [name]  . Number of children: 2  . Years of education: 9  . Highest education level: Some college, no degree  Occupational History  . Occupation: Production designer, theatre/television/film    Comment: Part time  Tobacco Use  . Smoking status: Former Smoker    Packs/day: 1.00    Years: 19.00    Pack years: 19.00    Types: Cigarettes    Quit date: 10/13/1978    Years since quitting: 40.9  . Smokeless tobacco: Former Systems developer    Types: Snuff    Quit date: 05/30/2008  Substance and Sexual Activity  . Alcohol use: Not Currently  . Drug use: No  . Sexual activity: Yes  Other  Topics Concern  . Not on file  Social History Narrative   Lives at home with wife.  He has one son and a daughter that died in a tornado in 59. 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 He has one living grandson and one grandson that passed away in 11-Oct-2016 at age 76 from cancer.   Social Determinants of Health   Financial Resource Strain: Low Risk   . Difficulty of Paying Living Expenses: Not hard at all  Food Insecurity: No Food Insecurity  . Worried About Charity fundraiser in the Last Year: Never true  . Ran Out of Food in the Last Year: Never true  Transportation Needs: No Transportation Needs  . Lack of Transportation (Medical): No  . Lack of Transportation (Non-Medical): No  Physical Activity: Inactive  . Days of Exercise per Week: 0 days  . Minutes of Exercise per Session: 0 min  Stress: No Stress Concern Present  . Feeling of Stress : Only a little  Social Connections: Not Isolated  . Frequency of Communication with Friends and Family: More than three times a week  . Frequency of Social Gatherings with Friends and Family: More than three times a week  . Attends Religious Services: More than 4 times per year  . Active Member of Clubs or Organizations: Yes  . Attends Archivist Meetings: More than 4 times per year  . Marital Status: Married  Human resources officer Violence: Not At Risk  . Fear of Current or Ex-Partner: No  . Emotionally Abused: No  . Physically Abused: No  . Sexually Abused: No    Family History  Problem Relation Age of Onset  . Cancer Maternal Aunt        breast  . Pancreatic cancer Paternal Aunt   . Prostate cancer Paternal Uncle   . Heart disease Maternal Grandmother 69  . Heart disease Maternal Grandfather        fluid / CHF   . Heart disease Paternal Grandfather 45  . Heart attack Paternal Grandfather        passed at 49  . Prostate cancer Father   . Coronary artery disease Father 48       CABG  . Hypertension Mother   .  Hyperlipidemia Mother   . Other Daughter        accident with tornado 10-11-1996  . Hypertension Son   . Colon cancer Neg Hx   . Colon polyps Neg Hx   . Esophageal cancer Neg Hx   . Rectal cancer Neg Hx   . Stomach cancer Neg Hx        Objective: Vitals:   08/31/19 1400  BP: (!) 152/56  Pulse: (!) 41  Temp: (!) 97.3 F (36.3 C)     Physical Exam  Lab Results:  Results for orders placed or performed in visit on 08/31/19 (from the past 24 hour(s))  POCT urinalysis dipstick     Status: Abnormal   Collection Time: 08/31/19  2:23 PM  Result Value Ref Range   Color, UA yellow    Clarity, UA clear    Glucose, UA Negative Negative   Bilirubin, UA small    Ketones, UA moderate    Spec Grav, UA 1.025 1.010 - 1.025   Blood, UA neg    pH, UA 5.0 5.0 - 8.0   Protein, UA Negative Negative   Urobilinogen, UA negative (A) 0.2 or 1.0 E.U./dL   Nitrite, UA neg    Leukocytes, UA Negative Negative   Appearance clear    Odor  BMET No results for input(s): NA, K, CL, CO2, GLUCOSE, BUN, CREATININE, CALCIUM in the last 72 hours. PSA PSA  Date Value Ref Range Status  06/25/2014 <0.1 0.0 - 4.0 ng/mL Final    Comment:    Roche ECLIA methodology. According to the American Urological Association, Serum PSA should decrease and remain at undetectable levels after radical prostatectomy. The AUA defines biochemical recurrence as an initial PSA value 0.2 ng/mL or greater followed by a subsequent confirmatory PSA value 0.2 ng/mL or greater. Values obtained with different assay methods or kits cannot be used interchangeably. Results cannot be interpreted as absolute evidence of the presence or absence of malignant disease.   06/12/2013 <0.1 0.0 - 4.0 ng/mL Final    Comment:    Roche ECLIA methodology. According to the American Urological Association, Serum PSA should decrease and remain at undetectable levels after radical prostatectomy. The AUA defines biochemical recurrence as an  initial PSA value 0.2 ng/mL or greater followed by a subsequent confirmatory PSA value 0.2 ng/mL or greater. Values obtained with different assay methods or kits cannot be used interchangeably. Results cannot be interpreted as absolute evidence of the presence or absence of malignant disease.   Testosterone  Date Value Ref Range Status  05/30/2014 51 (L) 348 - 1,197 ng/dL Final  09/24/2013 47 (L) 348 - 1,197 ng/dL Final      Studies/Results: No results found.    Assessment & Plan: 1. History of prostate cancer.   His PSA remains undetectible.  He will return in a year with a PSA.  2. SUI.  He has stable mild leakage.   3. History of stones.  He has had no stone symptoms.    4. Hypogonadism.   I will recheck his T at next f/u.   No orders of the defined types were placed in this encounter.    Orders Placed This Encounter  Procedures  . PSA    Standing Status:   Future    Standing Expiration Date:   02/27/2021  . Testosterone    Standing Status:   Future    Standing Expiration Date:   02/27/2021  . POCT urinalysis dipstick      Return in about 1 year (around 08/30/2020) for History of prostate cancer. .   CC: Dettinger, Fransisca Kaufmann, MD      Irine Seal 08/31/2019

## 2019-09-10 ENCOUNTER — Encounter: Payer: Self-pay | Admitting: Family

## 2019-09-10 ENCOUNTER — Ambulatory Visit (INDEPENDENT_AMBULATORY_CARE_PROVIDER_SITE_OTHER): Payer: Medicare Other | Admitting: Family

## 2019-09-10 ENCOUNTER — Other Ambulatory Visit: Payer: Self-pay

## 2019-09-10 VITALS — BP 147/58 | HR 62 | Temp 97.3°F | Ht 63.0 in | Wt 179.6 lb

## 2019-09-10 DIAGNOSIS — F329 Major depressive disorder, single episode, unspecified: Secondary | ICD-10-CM

## 2019-09-10 DIAGNOSIS — F411 Generalized anxiety disorder: Secondary | ICD-10-CM | POA: Diagnosis not present

## 2019-09-10 DIAGNOSIS — R140 Abdominal distension (gaseous): Secondary | ICD-10-CM | POA: Diagnosis not present

## 2019-09-10 DIAGNOSIS — F32A Depression, unspecified: Secondary | ICD-10-CM

## 2019-09-10 MED ORDER — OMEPRAZOLE 20 MG PO CPDR: 20.0000 mg | DELAYED_RELEASE_CAPSULE | Freq: Two times a day (BID) | ORAL | 1 refills | Status: DC

## 2019-09-10 MED ORDER — BUSPIRONE HCL 5 MG PO TABS: 5.0000 mg | ORAL_TABLET | Freq: Three times a day (TID) | ORAL | 2 refills | Status: DC | PRN

## 2019-09-10 MED ORDER — ESCITALOPRAM OXALATE 5 MG PO TABS: ORAL_TABLET | ORAL | 0 refills | Status: DC

## 2019-09-10 NOTE — Progress Notes (Signed)
Subjective:    Patient ID: George Wang, male    DOB: October 16, 1945, 74 y.o.   MRN: [id number]  Chief Complaint  Patient presents with  . Gas  . Bloated  . Anxiety   Pt presents to the office today with increased belching, gas, and bloating that started over the last 5 months, but has worsen over the last few weeks. He saw his PCP in 04/06/19 and was started on Pepcid. He has continued his Omeprazole 20 mg daily. He states he thought this may have helped for a little while, but now he does not see how this is helping. He has taken OTC gas-x with mild relief.   He reports his depression and anxiety is worsening and "taking over my life". He states he lost a child in [place] a grandchild two years ago and states he has been on Paxil in the past. Anxiety Presents for follow-up visit. Symptoms include depressed mood, excessive worry, insomnia, irritability, nervous/anxious behavior, palpitations, panic, restlessness and shortness of breath. Symptoms occur most days. The severity of symptoms is moderate.    Depression        This is a chronic problem.  The current episode started more than 1 year ago.   The onset quality is sudden.   The problem occurs every several days.  Associated symptoms include fatigue, helplessness, hopelessness, insomnia, irritable, restlessness, decreased interest and sad.  Past treatments include nothing.  Past medical history includes anxiety.       Review of Systems  Constitutional: Positive for fatigue and irritability.  Respiratory: Positive for shortness of breath.   Cardiovascular: Positive for palpitations.  Psychiatric/Behavioral: Positive for depression. The patient is nervous/anxious and has insomnia.   All other systems reviewed and are negative.      Objective:   Physical Exam Vitals reviewed.  Constitutional:      General: He is irritable. He is not in acute distress.    Appearance: He is well-developed.  HENT:     Head:  Normocephalic.     Right Ear: Tympanic membrane normal.     Left Ear: Tympanic membrane normal.  Eyes:     General:        Right eye: No discharge.        Left eye: No discharge.     Pupils: Pupils are equal, round, and reactive to light.  Neck:     Thyroid: No thyromegaly.  Cardiovascular:     Rate and Rhythm: Normal rate and regular rhythm.     Heart sounds: Normal heart sounds. No murmur.  Pulmonary:     Effort: Pulmonary effort is normal. No respiratory distress.     Breath sounds: Normal breath sounds. No wheezing.  Abdominal:     General: Bowel sounds are normal. There is no distension.     Palpations: Abdomen is soft.     Tenderness: There is no abdominal tenderness.  Musculoskeletal:        General: No tenderness. Normal range of motion.     Cervical back: Normal range of motion and neck supple.  Skin:    General: Skin is warm and dry.     Findings: No erythema or rash.  Neurological:     Mental Status: He is alert and oriented to person, place, and time.     Cranial Nerves: No cranial nerve deficit.     Deep Tendon Reflexes: Reflexes are normal and symmetric.  Psychiatric:  Mood and Affect: Mood is anxious and depressed. Affect is tearful.        Behavior: Behavior normal.        Thought Content: Thought content normal.        Judgment: Judgment normal.      BP (!) 147/58   Pulse 62   Temp (!) 97.3 F (36.3 C) (Temporal)   Ht 5\' 3"  (1.6 m)   Wt 179 lb 9.6 oz (81.5 kg)   SpO2 99%   BMI 31.81 kg/m       Assessment & Plan:  [name] comes in today with chief complaint of Gas, Bloated, and Anxiety   Diagnosis and orders addressed:  1. Bloating - omeprazole (PRILOSEC) 20 MG capsule; Take 1 capsule (20 mg total) by mouth 2 (two) times daily before a meal. TAKE ONE (1) CAPSULE EACH DAY  Dispense: 180 capsule; Refill: 1  2. Depression, unspecified depression type - escitalopram (LEXAPRO) 5 MG tablet; Take 1 tablet (5 mg total) by mouth daily  for 7 days, THEN 2 tablets (10 mg total) daily for 21 days.  Dispense: 49 tablet; Refill: 0 - busPIRone (BUSPAR) 5 MG tablet; Take 1 tablet (5 mg total) by mouth 3 (three) times daily as needed.  Dispense: 60 tablet; Refill: 2  3. Anxiety state - escitalopram (LEXAPRO) 5 MG tablet; Take 1 tablet (5 mg total) by mouth daily for 7 days, THEN 2 tablets (10 mg total) daily for 21 days.  Dispense: 49 tablet; Refill: 0 - busPIRone (BUSPAR) 5 MG tablet; Take 1 tablet (5 mg total) by mouth 3 (three) times daily as needed.  Dispense: 60 tablet; Refill: 2  4. Morbid obesity ([place])  Will start Lexapro 5 mg today for 1 week then increase to 10 mg  Stress management  I believe his anxiety is causing increased stomach issues. He has a GI provider and will follow up    Evelina Dun, FNP

## 2019-09-10 NOTE — Patient Instructions (Signed)

## 2019-09-16 ENCOUNTER — Emergency Department (HOSPITAL_COMMUNITY): Payer: Medicare Other

## 2019-09-16 ENCOUNTER — Emergency Department (HOSPITAL_COMMUNITY)
Admission: EM | Admit: 2019-09-16 | Discharge: 2019-09-16 | Disposition: A | Discharge status: Home / Self Care | Payer: Medicare Other | Attending: Emergency Medicine | Admitting: Emergency Medicine

## 2019-09-16 ENCOUNTER — Other Ambulatory Visit: Payer: Self-pay

## 2019-09-16 ENCOUNTER — Encounter (HOSPITAL_COMMUNITY): Payer: Self-pay | Admitting: Emergency Medicine

## 2019-09-16 DIAGNOSIS — E119 Type 2 diabetes mellitus without complications: Secondary | ICD-10-CM | POA: Diagnosis not present

## 2019-09-16 DIAGNOSIS — Z85828 Personal history of other malignant neoplasm of skin: Secondary | ICD-10-CM | POA: Diagnosis not present

## 2019-09-16 DIAGNOSIS — R140 Abdominal distension (gaseous): Secondary | ICD-10-CM | POA: Diagnosis not present

## 2019-09-16 DIAGNOSIS — Z87891 Personal history of nicotine dependence: Secondary | ICD-10-CM | POA: Insufficient documentation

## 2019-09-16 DIAGNOSIS — I10 Essential (primary) hypertension: Secondary | ICD-10-CM | POA: Diagnosis not present

## 2019-09-16 DIAGNOSIS — Z79899 Other long term (current) drug therapy: Secondary | ICD-10-CM | POA: Diagnosis not present

## 2019-09-16 DIAGNOSIS — Z8546 Personal history of malignant neoplasm of prostate: Secondary | ICD-10-CM | POA: Insufficient documentation

## 2019-09-16 DIAGNOSIS — R109 Unspecified abdominal pain: Secondary | ICD-10-CM | POA: Insufficient documentation

## 2019-09-16 DIAGNOSIS — Z7984 Long term (current) use of oral hypoglycemic drugs: Secondary | ICD-10-CM | POA: Diagnosis not present

## 2019-09-16 DIAGNOSIS — R142 Eructation: Secondary | ICD-10-CM | POA: Insufficient documentation

## 2019-09-16 LAB — CBC WITH DIFFERENTIAL/PLATELET
Abs Immature Granulocytes: 0.03 10*3/uL (ref 0.00–0.07)
Basophils Absolute: 0.1 10*3/uL (ref 0.0–0.1)
Basophils Relative: 1 %
Eosinophils Absolute: 0.2 10*3/uL (ref 0.0–0.5)
Eosinophils Relative: 2 %
HCT: 36 % — ABNORMAL LOW (ref 39.0–52.0)
Hemoglobin: 12.8 g/dL — ABNORMAL LOW (ref 13.0–17.0)
Immature Granulocytes: 0 %
Lymphocytes Relative: 25 %
Lymphs Abs: 2 10*3/uL (ref 0.7–4.0)
MCH: 34 pg (ref 26.0–34.0)
MCHC: 35.6 g/dL (ref 30.0–36.0)
MCV: 95.7 fL (ref 80.0–100.0)
Monocytes Absolute: 0.8 10*3/uL (ref 0.1–1.0)
Monocytes Relative: 10 %
Neutro Abs: 4.9 10*3/uL (ref 1.7–7.7)
Neutrophils Relative %: 62 %
Platelets: 232 10*3/uL (ref 150–400)
RBC: 3.76 MIL/uL — ABNORMAL LOW (ref 4.22–5.81)
RDW: 12.2 % (ref 11.5–15.5)
WBC: 7.9 10*3/uL (ref 4.0–10.5)
nRBC: 0 % (ref 0.0–0.2)

## 2019-09-16 LAB — COMPREHENSIVE METABOLIC PANEL
ALT: 18 U/L (ref 0–44)
AST: 43 U/L — ABNORMAL HIGH (ref 15–41)
Albumin: 4.3 g/dL (ref 3.5–5.0)
Alkaline Phosphatase: 62 U/L (ref 38–126)
Anion gap: 9 (ref 5–15)
BUN: 18 mg/dL (ref 8–23)
CO2: 27 mmol/L (ref 22–32)
Calcium: 9.4 mg/dL (ref 8.9–10.3)
Chloride: 102 mmol/L (ref 98–111)
Creatinine, Ser: 0.79 mg/dL (ref 0.61–1.24)
GFR calc Af Amer: >60 mL/min (ref >60)
GFR calc non Af Amer: >60 mL/min (ref >60)
Glucose, Bld: 85 mg/dL (ref 70–99)
Potassium: 3.5 mmol/L (ref 3.5–5.1)
Sodium: 138 mmol/L (ref 135–145)
Total Bilirubin: 1.1 mg/dL (ref 0.3–1.2)
Total Protein: 6.9 g/dL (ref 6.5–8.1)

## 2019-09-16 LAB — TROPONIN I (HIGH SENSITIVITY)
Troponin I (High Sensitivity): 3 ng/L (ref <18)
Troponin I (High Sensitivity): 3 ng/L (ref <18)

## 2019-09-16 LAB — LIPASE, BLOOD: Lipase: 34 U/L (ref 11–51)

## 2019-09-16 MED ORDER — IOHEXOL 300 MG/ML  SOLN
100.0000 mL | Freq: Once | INTRAMUSCULAR | Status: AC | PRN
  Administered 2019-09-16: 100 mL via INTRAVENOUS

## 2019-09-16 MED ORDER — SODIUM CHLORIDE (PF) 0.9 % IJ SOLN
INTRAMUSCULAR | Status: AC
  Filled 2019-09-16: qty 50

## 2019-09-16 NOTE — ED Triage Notes (Signed)
Pt reports that since before September he has issues with belching and gas out of his bottom and stomach pressure. Reports has seen his PCP who put on medications. But now causing him no to sleep at night. Repots "today could hear my innards roll and rumble".

## 2019-09-16 NOTE — ED Notes (Signed)
[name], wife would like an update on her husband, [phone], [phone], waiting in the parking lot of hospital.

## 2019-09-16 NOTE — Discharge Instructions (Signed)
Your testing is reassuring.  Take your medications as prescribed and follow-up with your stomach doctor.  As we discussed, anxiety may be contributing to your stomach issues.  Your stomach doctor may want to do another endoscopy.  Continue to take your anxiety medicine as prescribed.  Return to the ED if you develop new or worsening symptoms.

## 2019-09-16 NOTE — ED Provider Notes (Signed)
[facility] DEPT Provider Note   CSN: [id number] Arrival date & time: 09/16/19  1756     History Chief Complaint  Patient presents with  . Gas    George Wang is a 74 y.o. male.  Patient with history of diabetes, IBS, prostate cancer, anxiety here with intermittent "belching and gas" since September but progressively worsening over the past 1 day.  States he has been doing her stomach but here with pain.  Has had belching and gas from above and below that has been coming and going for several months. Denies nausea, vomiting, diarrhea, black or bloody stools. No fever. No chest pain or SOB.  States he had a normal colonoscopy several years ago with polyp removal.  Also has an EGD the remote past but does not know the results.  He saw his PCP this week medications for anxiety which he feels could be contributing.  He reported having suicidal thoughts homicidal thoughts.  Does not have any pain currently.  States he called EMS today because he was having worsening pressure in his stomach around.  States this could be related to his anxiety.  He denies any previous abdominal surgeries.  The history is provided by the patient.       Past Medical History:  Diagnosis Date  . Allergy    mild  . Anemia    Iron deficiency  . Anxiety state, unspecified   . Aortic atherosclerosis ([place])   . Arthritis   . Calculus of kidney    kidney stones  - normal BMP   . Cancer [facility])    Prostate, skin cancer x 2   . Cataract    forming both eyes  . Depressive disorder, not elsewhere classified   . Diverticulosis of colon (without mention of hemorrhage)   . GERD (gastroesophageal reflux disease)    on nexium  . Gout, unspecified   . Heart murmur   . Hyperlipidemia   . Hypertension, essential   . Irritable bowel syndrome   . Neuromuscular disorder (H[facility])    numbness in knees from back issues   . Personal history of malignant neoplasm of prostate   . PVC (premature  ventricular contraction)   . Type II or unspecified type diabetes mellitus without mention of complication, not stated as uncontrolled     Patient Active Problem List   Diagnosis Date Noted  . Murmur, cardiac 04/05/2018  . Thoracic aortic atherosclerosis ([place]) 05/09/2017  . Morbid obesity ([place]) 11/21/2015  . Essential hypertension 02/28/2013  . Hyperlipemia 02/28/2013  . Type 2 diabetes mellitus ([place]) 02/28/2013  . Vitamin D deficiency 02/28/2013  . Overweight 10/13/2011  . Frequent PVCs 10/13/2011  . HEMOCCULT POSITIVE STOOL 08/14/2008  . Iron deficiency anemia 06/27/2008  . GOUT 04/25/2008  . Anxiety state 04/25/2008  . Depression 04/25/2008  . DIVERTICULOSIS, COLON 04/25/2008  . IRRITABLE BOWEL SYNDROME 04/25/2008  . NEPHROLITHIASIS 04/25/2008  . PROSTATE CANCER, HX OF 04/25/2008    Past Surgical History:  Procedure Laterality Date  . COLONOSCOPY    . HERNIA REPAIR     left side  . PROSTATE SURGERY    . skin cancer removed     chest x 2 3-4 yrs ago   . TONSILLECTOMY         Family History  Problem Relation Age of Onset  . Cancer Maternal Aunt        breast  . Pancreatic cancer Paternal Aunt   . Prostate cancer Paternal Uncle   .  Heart disease Maternal Grandmother 69  . Heart disease Maternal Grandfather        fluid / CHF   . Heart disease Paternal Grandfather 64  . Heart attack Paternal Grandfather        passed at 54  . Prostate cancer Father   . Coronary artery disease Father 53       CABG  . Hypertension Mother   . Hyperlipidemia Mother   . Other Daughter        accident with tornado 1998  . Hypertension Son   . Colon cancer Neg Hx   . Colon polyps Neg Hx   . Esophageal cancer Neg Hx   . Rectal cancer Neg Hx   . Stomach cancer Neg Hx     Social History   Tobacco Use  . Smoking status: Former Smoker    Packs/day: 1.00    Years: 19.00    Pack years: 19.00    Types: Cigarettes    Quit date: 10/13/1978    Years since quitting: 40.9  .  Smokeless tobacco: Former Systems developer    Types: Snuff    Quit date: 05/30/2008  Substance Use Topics  . Alcohol use: Not Currently  . Drug use: No    Home Medications Prior to Admission medications   Medication Sig Start Date End Date Taking? Authorizing Provider  acetaminophen (TYLENOL) 325 MG tablet Take 650 mg by mouth every 6 (six) hours as needed for mild pain.    [provider]  amLODipine (NORVASC) 10 MG tablet Take 1 tablet (10 mg total) by mouth daily. 08/06/19   Dettinger, Fransisca Kaufmann, MD  atorvastatin (LIPITOR) 80 MG tablet Take 0.5 tablets (40 mg total) by mouth daily at 6 PM. 08/06/19   Dettinger, Fransisca Kaufmann, MD  beta carotene 25000 UNIT capsule Take 25,000 Units by mouth daily.    [provider]  Blood Glucose Calibration (OT ULTRA/FASTTK CNTRL SOLN) SOLN Use with meter as directed Dx E11.9 12/05/18   Chipper Herb, MD  blood glucose meter kit and supplies Dispense based on patient and insurance preference. Use up to four times daily as directed. (FOR ICD-10 E10.9, E11.9). 10/05/17   Chipper Herb, MD  busPIRone (BUSPAR) 5 MG tablet Take 1 tablet (5 mg total) by mouth 3 (three) times daily as needed. 09/10/19   Sharion Balloon, FNP  chlorthalidone (HYGROTON) 25 MG tablet TAKE 1 TABLET DAILY FOR BLOOD PRESSURE 07/31/19   Dettinger, Fransisca Kaufmann, MD  Cholecalciferol (VITAMIN D) 2000 UNITS CAPS Take 2 capsules by mouth daily.     [provider]  escitalopram (LEXAPRO) 5 MG tablet Take 1 tablet (5 mg total) by mouth daily for 7 days, THEN 2 tablets (10 mg total) daily for 21 days. 09/10/19 10/08/19  Sharion Balloon, FNP  famotidine (PEPCID) 20 MG tablet Take 1 tablet (20 mg total) by mouth 2 (two) times daily as needed for heartburn or indigestion. 08/06/19   Dettinger, Fransisca Kaufmann, MD  FeFum-FePo-FA-B Cmp-C-Zn-Mn-Cu (SE-TAN PLUS) 162-115.2-1 MG CAPS Take 1 capsule by mouth 2 (two) times daily. 08/06/19   Dettinger, Fransisca Kaufmann, MD  Fiber POWD Take 1 Scoop by mouth daily as  needed.     [provider]  fish oil-omega-3 fatty acids 1000 MG capsule Take 1 g by mouth 2 (two) times daily.     [provider]  glimepiride (AMARYL) 4 MG tablet Take 0.5 tablets (2 mg total) by mouth 2 (two) times daily. 08/06/19  Dettinger, Fransisca Kaufmann, MD  glucose blood test strip 1 each by Other route 4 (four) times daily. 04/13/19   Dettinger, Fransisca Kaufmann, MD  losartan (COZAAR) 100 MG tablet Take 1 tablet (100 mg total) by mouth daily. 04/06/19   Dettinger, Fransisca Kaufmann, MD  metFORMIN (GLUCOPHAGE) 500 MG tablet TAKE 1 TABLET EACH MORNING AND 1 TABLETEACH EVENING. 08/06/19   Dettinger, Fransisca Kaufmann, MD  Multiple Vitamins-Minerals (CENTRUM SILVER PO) Take 1 tablet by mouth daily.     [provider]  omeprazole (PRILOSEC) 20 MG capsule Take 1 capsule (20 mg total) by mouth 2 (two) times daily before a meal. TAKE ONE (1) CAPSULE EACH DAY 09/10/19   Hawks, Alyse Low A, FNP  traMADol (ULTRAM) 50 MG tablet Take 1 tablet (50 mg total) by mouth every 6 (six) hours as needed for moderate pain. 04/06/19   Dettinger, Fransisca Kaufmann, MD  febuxostat (ULORIC) 40 MG tablet Take 80 mg by mouth daily.  10/13/11  [provider]  simvastatin (ZOCOR) 80 MG tablet Take 80 mg by mouth at bedtime.  07/03/18  [provider]    Allergies    Patient has no known allergies.  Review of Systems   Review of Systems  Constitutional: Negative for activity change, appetite change and fever.  HENT: Negative for congestion and rhinorrhea.   Respiratory: Negative for chest tightness.   Cardiovascular: Negative for chest pain.  Gastrointestinal: Positive for abdominal pain. Negative for nausea and vomiting.  Genitourinary: Negative for dysuria and hematuria.  Musculoskeletal: Negative for arthralgias and myalgias.  Skin: Negative for rash.  Neurological: Negative for dizziness, weakness and headaches.  Psychiatric/Behavioral: Positive for sleep disturbance. Negative for behavioral problems,  hallucinations and suicidal ideas. The patient is nervous/anxious.     all other systems are negative except as noted in the HPI and PMH.   Physical Exam Updated Vital Signs BP (!) 157/88 (BP Location: Left Arm)   Pulse 71   Temp 98.2 F (36.8 C) (Oral)   Resp 19   SpO2 97%   Physical Exam Vitals and nursing note reviewed.  Constitutional:      General: He is not in acute distress.    Appearance: He is well-developed.     Comments: Flat affect  HENT:     Head: Normocephalic and atraumatic.     Mouth/Throat:     Pharynx: No oropharyngeal exudate.  Eyes:     Conjunctiva/sclera: Conjunctivae normal.     Pupils: Pupils are equal, round, and reactive to light.  Neck:     Comments: No meningismus. Cardiovascular:     Rate and Rhythm: Normal rate and regular rhythm.     Heart sounds: Normal heart sounds. No murmur.  Pulmonary:     Effort: Pulmonary effort is normal. No respiratory distress.     Breath sounds: Normal breath sounds.  Abdominal:     Palpations: Abdomen is soft.     Tenderness: There is no abdominal tenderness. There is no guarding or rebound.  Musculoskeletal:        General: No tenderness. Normal range of motion.     Cervical back: Normal range of motion and neck supple.  Skin:    General: Skin is warm.  Neurological:     Mental Status: He is alert and oriented to person, place, and time.     Cranial Nerves: No cranial nerve deficit.     Motor: No abnormal muscle tone.     Coordination: Coordination normal.     Comments:  No ataxia on finger to nose bilaterally. No pronator drift. 5/5 strength throughout. CN 2-12 intact.Equal grip strength. Sensation intact.   Psychiatric:        Behavior: Behavior normal.     ED Results / Procedures / Treatments   Labs (all labs ordered are listed, but only abnormal results are displayed) Labs Reviewed  CBC WITH DIFFERENTIAL/PLATELET - Abnormal; Notable for the following components:      Result Value   RBC 3.76 (*)      Hemoglobin 12.8 (*)    HCT 36.0 (*)    All other components within normal limits  COMPREHENSIVE METABOLIC PANEL - Abnormal; Notable for the following components:   AST 43 (*)    All other components within normal limits  LIPASE, BLOOD  TROPONIN I (HIGH SENSITIVITY)  TROPONIN I (HIGH SENSITIVITY)    EKG EKG Interpretation  Date/Time:  Sunday September 16 2019 18:36:06 EST Ventricular Rate:  62 PR Interval:    QRS Duration: 100 QT Interval:  397 QTC Calculation: 404 R Axis:   -94 Text Interpretation: Sinus rhythm Probable left atrial enlargement Left anterior fascicular block Low voltage, precordial leads Consider anterior infarct No significant change was found Confirmed by Ezequiel Essex [phone]) on 09/16/2019 6:49:03 PM   Radiology CT ABDOMEN PELVIS W CONTRAST  Result Date: 09/16/2019 CLINICAL DATA:  Abdominal pain. EXAM: CT ABDOMEN AND PELVIS WITH CONTRAST TECHNIQUE: Multidetector CT imaging of the abdomen and pelvis was performed using the standard protocol following bolus administration of intravenous contrast. CONTRAST:  177m OMNIPAQUE IOHEXOL 300 MG/ML  SOLN COMPARISON:  None. FINDINGS: Lower chest: The lung bases are clear. The heart size is normal. The main pulmonary artery is mildly dilated which can be seen in patients with elevated pulmonary artery pressures. Hepatobiliary: The liver is normal. Normal gallbladder.There is no biliary ductal dilation. Pancreas: Normal contours without ductal dilatation. No peripancreatic fluid collection. Spleen: No splenic laceration or hematoma. Adrenals/Urinary Tract: --Adrenal glands: No adrenal hemorrhage. --Right kidney/ureter: No hydronephrosis or perinephric hematoma. --Left kidney/ureter: No hydronephrosis or perinephric hematoma. --Urinary bladder: The bladder is decompressed which limits evaluation. Stomach/Bowel: --Stomach/Duodenum: No hiatal hernia or other gastric abnormality. Normal duodenal course and caliber. --Small bowel: No  dilatation or inflammation. --Colon: Rectosigmoid diverticulosis without acute inflammation. --Appendix: Normal. Vascular/Lymphatic: Atherosclerotic calcification is present within the non-aneurysmal abdominal aorta, without hemodynamically significant stenosis. --No retroperitoneal lymphadenopathy. --No mesenteric lymphadenopathy. --No pelvic or inguinal lymphadenopathy. Reproductive: Unremarkable Other: No ascites or free air. There are bilateral fat containing inguinal hernias. Musculoskeletal. There multilevel degenerative changes throughout the lumbar spine without evidence for an acute displaced fracture. IMPRESSION: Rectosigmoid diverticulosis without diverticulitis or other acute intra-abdominal or pelvic abnormality. No findings to explain the patient's abdominal pain or stomach pressure. Electronically Signed   By: CConstance HolsterM.D.   On: 09/16/2019 19:57    Procedures Procedures (including critical care time)  Medications Ordered in ED Medications - No data to display  ED Course  I have reviewed the triage vital signs and the nursing notes.  Pertinent labs & imaging results that were available during my care of the patient were reviewed by me and considered in my medical decision making (see chart for details).    MDM Rules/Calculators/A&P                      Several months of abdominal discomfort with belching and flatulence.  He denies any pain currently.  Denies any vomiting, diarrhea, black or bloody stools.  No  chest pain or shortness of breath.  Work-up is reassuring.  Labs show normal LFTs, lipase and troponin.  Low suspicion for ACS.  CT scan is reassuring with evidence of diverticulosis, no acute findings.  Troponin negative x2. Patient tolerating PO and ambulatory. Continue PPI.  Patient resting comfortably on recheck with soft abdomen.  Denies any nausea, vomiting or abdominal pain.  Results discussed with patient and his wife.  Discussed that emergency work-up  is reassuring but he should follow-up with his gastroenterologist for further evaluation and possible update his endoscopy.  It is possible anxiety could be playing a role in his abdominal issues. Return precautions discussed.   Final Clinical Impression(s) / ED Diagnoses Final diagnoses:  Stomach discomfort  Belching    Rx / DC Orders ED Discharge Orders    None       Sahir Tolson, Annie Main, MD 09/16/19 2345

## 2019-09-17 ENCOUNTER — Telehealth: Payer: Self-pay | Admitting: Family Medicine

## 2019-09-17 MED ORDER — BUSPIRONE HCL 7.5 MG PO TABS: 7.5000 mg | ORAL_TABLET | Freq: Three times a day (TID) | ORAL | 1 refills | Status: DC

## 2019-09-17 NOTE — Telephone Encounter (Signed)
Lexapro 10mg  started today  He is taking Buspar prn  Buspar is not helping at all. He is questioning increasing the dosage of that

## 2019-09-17 NOTE — Telephone Encounter (Signed)
Patient aware and verbalizes understanding. 

## 2019-09-17 NOTE — Telephone Encounter (Signed)
Was put on Lexapro on 2/22- covering PCP - please advise

## 2019-09-17 NOTE — Telephone Encounter (Signed)
LMTRC

## 2019-09-17 NOTE — Telephone Encounter (Signed)
Buspar increased to 7.5 mg TID from 5 Mg. Continue Lexapro 10 mg.

## 2019-09-17 NOTE — Telephone Encounter (Signed)
Is he currently taking Lexapro 5mg  or 10 mg. It does take several weeks to get to therapeutic dosing. We can increase up to 20 mg. Is he taking Buspar as needed too?

## 2019-09-17 NOTE — Addendum Note (Signed)
Addended by: Evelina Dun A on: 09/17/2019 04:15 PM   Modules accepted: Orders

## 2019-09-17 NOTE — Telephone Encounter (Signed)
Pt's wife called stating that George Wang recently put him on some medication for anxiety and says the medication is not helping pt at all. Wants dosage to be increased or wants pt to try something else.

## 2019-09-18 ENCOUNTER — Telehealth (HOSPITAL_COMMUNITY): Payer: Self-pay | Admitting: Psychiatry

## 2019-09-18 ENCOUNTER — Ambulatory Visit (HOSPITAL_COMMUNITY)
Admission: RE | Admit: 2019-09-18 | Discharge: 2019-09-18 | Disposition: A | Discharge status: Home / Self Care | Payer: Medicare Other | Attending: Psychiatry | Admitting: Psychiatry

## 2019-09-18 DIAGNOSIS — R450 Nervousness: Secondary | ICD-10-CM | POA: Insufficient documentation

## 2019-09-18 DIAGNOSIS — F419 Anxiety disorder, unspecified: Secondary | ICD-10-CM | POA: Diagnosis not present

## 2019-09-18 DIAGNOSIS — G479 Sleep disorder, unspecified: Secondary | ICD-10-CM | POA: Diagnosis not present

## 2019-09-18 MED ORDER — HYDROXYZINE HCL 25 MG PO TABS
25.0000 mg | ORAL_TABLET | Freq: Three times a day (TID) | ORAL | Status: DC | PRN
  Filled 2019-09-18: qty 1

## 2019-09-18 MED ORDER — HYDROXYZINE HCL 25 MG PO TABS: 25.0000 mg | ORAL_TABLET | Freq: Three times a day (TID) | ORAL | 0 refills | Status: DC | PRN

## 2019-09-18 NOTE — Telephone Encounter (Signed)
D:  Deirdre M (TTS) referred pt to [facility].  A:  Placed call to orient patient.  He wouldn't talk to case manager; he handed the phone to his wife.  According to wife, pt wouldn't want groups but would prefer a psychiatrist to check on his medication because it's not working.  Informed Mrs. Alroy Dust, that [facility] has a provider who can do medication management.  Pt still is declining [facility].  Transferred Mrs. Rodrigue to front desk to schedule an appointment with a psychiatrist in the clinic.
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
## 2019-09-18 NOTE — H&P (Signed)
Behavioral Health Medical Screening Exam  George Wang is an 74 y.o. male patient presents as walk in at [facility] accompanied by his wife with complaints of  Worsening anxiety and no improvement over the last 2 weeks since PCP had been treating with Lexapro and Buspar.  Patient denies suicidal/self-harm/homicidal ideation, psychosis, and paranoia.  Discussed outpatient psychiatric services and adding Vistaril. Patient in agreement and voiced understanding.    Total Time spent with patient: 30 minutes  Psychiatric Specialty Exam: Physical Exam  Vitals reviewed. Constitutional: He is oriented to person, place, and time. He appears well-developed and well-nourished. No distress.  Respiratory: Effort normal.  Musculoskeletal:        General: Normal range of motion.     Cervical back: Normal range of motion.  Neurological: He is alert and oriented to person, place, and time.  Skin: Skin is warm and dry.  Psychiatric: His speech is normal and behavior is normal. Judgment and thought content normal. His mood appears anxious. Cognition and memory are normal.    Review of Systems  Psychiatric/Behavioral: Positive for sleep disturbance (Difficulty falling to sleep). Negative for agitation, behavioral problems, decreased concentration, hallucinations, self-injury and suicidal ideas. The patient is nervous/anxious.   All other systems reviewed and are negative.   There were no vitals taken for this visit.There is no height or weight on file to calculate BMI.  General Appearance: Casual and Neat  Eye Contact:  Good  Speech:  Clear and Coherent and Normal Rate  Volume:  Normal  Mood:  Anxious  Affect:  Appropriate and Congruent  Thought Process:  Coherent, Goal Directed and Descriptions of Associations: Intact  Orientation:  Full (Time, Place, and Person)  Thought Content:  WDL and Logical  Suicidal Thoughts:  No  Homicidal Thoughts:  No  Memory:  Immediate;   Good Recent;   Good  Judgement:   Intact  Insight:  Present  Psychomotor Activity:  Normal  Concentration: Concentration: Good and Attention Span: Good  Recall:  Good  Fund of Knowledge:Good  Language: Good  Akathisia:  No  Handed:  Right  AIMS (if indicated):     Assets:  Communication Skills Desire for Improvement Financial Resources/Insurance Physical Health Resilience Social Support Transportation  Sleep:       Musculoskeletal: Strength & Muscle Tone: within normal limits Gait & Station: normal Patient leans: N/A  There were no vitals taken for this visit.  Recommendations:  RX for Vistaril 25 mg tid started.  Referral to [facility] outpatient services sent in.   Based on my evaluation the patient does not appear to have an emergency medical condition.  Shuvon Rankin, NP 09/18/2019, 1:16 PM

## 2019-09-26 ENCOUNTER — Ambulatory Visit (INDEPENDENT_AMBULATORY_CARE_PROVIDER_SITE_OTHER): Payer: Medicare Other | Admitting: Psychiatry

## 2019-09-26 ENCOUNTER — Encounter: Payer: Self-pay | Admitting: Nurse Practitioner

## 2019-09-26 ENCOUNTER — Other Ambulatory Visit: Payer: Self-pay

## 2019-09-26 ENCOUNTER — Encounter (HOSPITAL_COMMUNITY): Payer: Self-pay | Admitting: Psychiatry

## 2019-09-26 ENCOUNTER — Ambulatory Visit: Payer: Medicare Other | Admitting: Nurse Practitioner

## 2019-09-26 VITALS — BP 112/52 | HR 64 | Temp 97.9°F | Ht 63.0 in | Wt 175.5 lb

## 2019-09-26 VITALS — BP 140/62 | HR 60 | Resp 12 | Ht 63.0 in | Wt 176.8 lb

## 2019-09-26 DIAGNOSIS — R142 Eructation: Secondary | ICD-10-CM

## 2019-09-26 DIAGNOSIS — F419 Anxiety disorder, unspecified: Secondary | ICD-10-CM | POA: Diagnosis not present

## 2019-09-26 DIAGNOSIS — K219 Gastro-esophageal reflux disease without esophagitis: Secondary | ICD-10-CM

## 2019-09-26 DIAGNOSIS — R143 Flatulence: Secondary | ICD-10-CM | POA: Diagnosis not present

## 2019-09-26 DIAGNOSIS — F321 Major depressive disorder, single episode, moderate: Secondary | ICD-10-CM | POA: Diagnosis not present

## 2019-09-26 MED ORDER — TEMAZEPAM 15 MG PO CAPS: ORAL_CAPSULE | ORAL | 3 refills | Status: DC

## 2019-09-26 MED ORDER — BUSPIRONE HCL 15 MG PO TABS: ORAL_TABLET | ORAL | 5 refills | Status: DC

## 2019-09-26 MED ORDER — MIRTAZAPINE 30 MG PO TABS: ORAL_TABLET | ORAL | 3 refills | Status: DC

## 2019-09-26 NOTE — Progress Notes (Signed)
Psychiatric Initial Adult Assessment   Patient Identification: George Wang MRN:  [id number] Date of Evaluation:  09/26/2019 Referral Source: Community/emergency room Chief Complaint:   Visit Diagnosis: Major depression  History of Present Illness:    This patient is a 73 year old white married male who is being evaluated for depression.  The patient was doing fairly well up to about a month ago when he started experiencing significant worsening GI pain.  This is been an issue for a number of months but in the last month he got much worse and he became excessively anxious and worried about the consequences of it.  Eventually led to an emergency room visit a negative CAT scan and referral to a gastroenterologist.  The conclusion of all the work-up is that there is no specific medical illness to explain his distress.  The general conclusion is that this is probably related to anxiety.  At this time there is no specific new stressor other than the worsening pandemic.  It is noted that his daughter died in 7 from a tragedy and their grandson died 2 years ago.  According to the patient is not an issue at this time.  At this time for the last 2 to 3 weeks this patient is experienced persistent daily depression.  His sleep is dramatically affected.  When he goes to bed at night he cannot stop thinking he has intrusive thoughts of what could be happening.  He is constantly thinking to himself while it.  His appetite is reduced and he has lost about 5 pounds.  His energy level is low his ability to think and concentrate is afflicted.  He says he is psychomotor slowed.  The most dramatic significant geriatric symptom is that he is extremely somatic.  He is not suspicious.  He denies being suicidal now and never has been.  The patient denies use of alcohol or any drugs.  He has no psychotic symptoms.  There is some question of a history in the past of major depression in 1989 he was admitted to [facility]  for depression.  At that time he had somatic symptoms as well.  The patient denies symptoms of generalized anxiety disorder panic disorder.  He does not really have obsessive-compulsive disorder although he has some obsessive thinking. His medical history is significant for gastroesophageal reflux disease and diabetes His past psychiatric history is significant for psychiatric hospitalization in 1989 and short episodes of psychotherapy at [facility] counseling center. At this time the patient has had his Lexapro increased to 10 mg about 1 week ago he has no benefit at this time.  He also is taking an erratic dose of BuSpar.  Associated Signs/Symptoms: Depression Symptoms:  depressed mood, (Hypo) Manic Symptoms:   Anxiety Symptoms:  Excessive Worry, Psychotic Symptoms:   PTSD Symptoms:   Past Psychiatric History: 1 psychiatric hospitalization in 1989 presently on Lexapro 10 mg, BuSpar 30 mg  Previous Psychotropic Medications: Yes   Substance Abuse History in the last 12 months:  No.  Consequences of Substance Abuse:   Past Medical History:  Past Medical History:  Diagnosis Date  . Allergy    mild  . Anemia    Iron deficiency  . Anxiety state, unspecified   . Aortic atherosclerosis ([place])   . Arthritis   . Calculus of kidney    kidney stones  - normal BMP   . Cancer [facility])    Prostate, skin cancer x 2   . Cataract    forming both  eyes  . Depressive disorder, not elsewhere classified   . Diverticulosis of colon (without mention of hemorrhage)   . GERD (gastroesophageal reflux disease)    on nexium  . Gout, unspecified   . Heart murmur   . Hyperlipidemia   . Hypertension, essential   . Irritable bowel syndrome   . Neuromuscular disorder (HCC)    numbness in knees from back issues   . Personal history of malignant neoplasm of prostate   . PVC (premature ventricular contraction)   . Type II or unspecified type diabetes mellitus without mention of complication, not stated  as uncontrolled     Past Surgical History:  Procedure Laterality Date  . COLONOSCOPY    . HERNIA REPAIR     left side  . PROSTATE SURGERY    . skin cancer removed     chest x 2 3-4 yrs ago   . TONSILLECTOMY      Family Psychiatric History:   Family History:  Family History  Problem Relation Age of Onset  . Cancer Maternal Aunt        breast  . Pancreatic cancer Paternal Aunt   . Prostate cancer Paternal Uncle   . Heart disease Maternal Grandmother 69  . Heart disease Maternal Grandfather        fluid / CHF   . Heart disease Paternal Grandfather 19  . Heart attack Paternal Grandfather        passed at 41  . Prostate cancer Father   . Coronary artery disease Father 41       CABG  . Hypertension Mother   . Hyperlipidemia Mother   . Other Daughter        accident with tornado 10-Oct-1996  . Hypertension Son   . Colon cancer Neg Hx   . Colon polyps Neg Hx   . Esophageal cancer Neg Hx   . Rectal cancer Neg Hx   . Stomach cancer Neg Hx     Social History:   Social History   Socioeconomic History  . Marital status: Married    Spouse name: [name]  . Number of children: 2  . Years of education: 67  . Highest education level: Some college, no degree  Occupational History  . Occupation: Production designer, theatre/television/film    Comment: Part time  Tobacco Use  . Smoking status: Former Smoker    Packs/day: 1.00    Years: 19.00    Pack years: 19.00    Types: Cigarettes    Quit date: 10/13/1978    Years since quitting: 40.9  . Smokeless tobacco: Former Systems developer    Types: Snuff    Quit date: 05/30/2008  Substance and Sexual Activity  . Alcohol use: Not Currently  . Drug use: No  . Sexual activity: Yes  Other Topics Concern  . Not on file  Social History Narrative   Lives at home with wife.  He has one son and a daughter that died in a tornado in 20. 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 He has one living grandson and one grandson that passed away in October 10, 2016 at age 72  from cancer.   Social Determinants of Health   Financial Resource Strain: Low Risk   . Difficulty of Paying Living Expenses: Not hard at all  Food Insecurity: No Food Insecurity  . Worried About Charity fundraiser in the Last Year: Never true  . Ran Out of Food in the Last Year: Never true  Transportation Needs:  No Transportation Needs  . Lack of Transportation (Medical): No  . Lack of Transportation (Non-Medical): No  Physical Activity: Inactive  . Days of Exercise per Week: 0 days  . Minutes of Exercise per Session: 0 min  Stress: No Stress Concern Present  . Feeling of Stress : Only a little  Social Connections: Not Isolated  . Frequency of Communication with Friends and Family: More than three times a week  . Frequency of Social Gatherings with Friends and Family: More than three times a week  . Attends Religious Services: More than 4 times per year  . Active Member of Clubs or Organizations: Yes  . Attends Archivist Meetings: More than 4 times per year  . Marital Status: Married    Additional Social History:   Allergies:  No Known Allergies  Metabolic Disorder Labs: Lab Results  Component Value Date   HGBA1C 6.6 08/06/2019   No results found for: PROLACTIN Lab Results  Component Value Date   CHOL 101 04/06/2019   TRIG 67 04/06/2019   HDL 34 (L) 04/06/2019   CHOLHDL 3.0 04/06/2019   LDLCALC 52 04/06/2019   LDLCALC 47 11/03/2018   Lab Results  Component Value Date   TSH 1.330 12/05/2018    Therapeutic Level Labs: No results found for: LITHIUM No results found for: CBMZ No results found for: VALPROATE  Current Medications: Current Outpatient Medications  Medication Sig Dispense Refill  . acetaminophen (TYLENOL) 325 MG tablet Take 650 mg by mouth every 6 (six) hours as needed for mild pain.    Marland Kitchen amLODipine (NORVASC) 10 MG tablet Take 1 tablet (10 mg total) by mouth daily. 90 tablet 3  . atorvastatin (LIPITOR) 80 MG tablet Take 0.5 tablets (40  mg total) by mouth daily at 6 PM. 45 tablet 3  . beta carotene 25000 UNIT capsule Take 25,000 Units by mouth daily.    . Blood Glucose Calibration (OT ULTRA/FASTTK CNTRL SOLN) SOLN Use with meter as directed Dx E11.9 1 each 1  . blood glucose meter kit and supplies Dispense based on patient and insurance preference. Use up to four times daily as directed. (FOR ICD-10 E10.9, E11.9). 1 each 1  . busPIRone (BUSPAR) 15 MG tablet Take 1-2 tablets by mouth 3 (three) times daily as needed.    . chlorthalidone (HYGROTON) 25 MG tablet TAKE 1 TABLET DAILY FOR BLOOD PRESSURE (Patient taking differently: Take 25 mg by mouth daily. ) 30 tablet 4  . Cholecalciferol (VITAMIN D) 2000 UNITS CAPS Take 2 capsules by mouth daily.     . diphenhydramine-acetaminophen (TYLENOL PM) 25-500 MG TABS tablet Take 1 tablet by mouth at bedtime as needed (sleep or pain).    Marland Kitchen escitalopram (LEXAPRO) 5 MG tablet Take 1 tablet (5 mg total) by mouth daily for 7 days, THEN 2 tablets (10 mg total) daily for 21 days. 49 tablet 0  . famotidine (PEPCID) 20 MG tablet Take 1 tablet (20 mg total) by mouth 2 (two) times daily as needed for heartburn or indigestion. 180 tablet 3  . FeFum-FePo-FA-B Cmp-C-Zn-Mn-Cu (SE-TAN PLUS) 162-115.2-1 MG CAPS Take 1 capsule by mouth 2 (two) times daily. 180 capsule 3  . Fiber POWD Take 1 Scoop by mouth daily as needed (bowel movement).     . fish oil-omega-3 fatty acids 1000 MG capsule Take 1 g by mouth 2 (two) times daily.     Marland Kitchen glimepiride (AMARYL) 4 MG tablet Take 0.5 tablets (2 mg total) by mouth 2 (two) times daily.  180 tablet 3  . glucose blood test strip 1 each by Other route 4 (four) times daily. 100 each 12  . hydrOXYzine (ATARAX/VISTARIL) 25 MG tablet Take 1 tablet (25 mg total) by mouth 3 (three) times daily as needed for anxiety. 30 tablet 0  . losartan (COZAAR) 100 MG tablet Take 1 tablet (100 mg total) by mouth daily. 90 tablet 3  . metFORMIN (GLUCOPHAGE) 500 MG tablet TAKE 1 TABLET EACH  MORNING AND 1 TABLETEACH EVENING. (Patient taking differently: Take 500-1,000 mg by mouth See admin instructions. Take 2 tablets (1,050m) by mouth every morning and 1 tablet (5042m by mouth every evening.) 180 tablet 3  . Multiple Vitamins-Minerals (CENTRUM SILVER PO) Take 1 tablet by mouth daily.     . Marland Kitchenmeprazole (PRILOSEC) 20 MG capsule Take 1 capsule (20 mg total) by mouth 2 (two) times daily before a meal. TAKE ONE (1) CAPSULE EACH DAY (Patient taking differently: Take 20 mg by mouth 2 (two) times daily before a meal. ) 180 capsule 1  . traMADol (ULTRAM) 50 MG tablet Take 1 tablet (50 mg total) by mouth every 6 (six) hours as needed for moderate pain. 20 tablet 0   No current facility-administered medications for this visit.    Musculoskeletal: Strength & Muscle Tone: within normal limits Gait & Station: normal Patient leans: N/A  Psychiatric Specialty Exam: Review of Systems  Blood pressure 140/62, pulse 60, resp. rate 12, height 5' 3"  (1.6 m), weight 176 lb 12.8 oz (80.2 kg).Body mass index is 31.32 kg/m.  General Appearance: Casual  Eye Contact:  Good  Speech:  Clear and Coherent  Volume:  Normal  Mood:  Depressed  Affect:  Appropriate  Thought Process:  Coherent  Orientation:  Full (Time, Place, and Person)  Thought Content:  WDL  Suicidal Thoughts:  No  Homicidal Thoughts:  No  Memory:  Negative  Judgement:  Good  Insight:  Good  Psychomotor Activity:  Normal  Concentration:    Recall:  Good  Fund of Knowledge:Fair  Language: Good  Akathisia:  No  Handed:  Right  AIMS (if indicated):  not done  Assets:  Desire for Improvement  ADL's:  Intact  Cognition: WNL  Sleep:  Good   Screenings: GAD-7     Office Visit from 09/10/2019 in [facility] GAD-7 Score  12    Mini-Mental     Clinical Support from 05/16/2017 in [facility] Score (max 30 points )  29    PHQ2-9     [facility] [place] 09[place]/2019 [place]  [facility] [facility] from 08/06/2019 in [facility] 07/06/2019 in [facility] Visit from 04/06/2019 in [facility] from 01/17/2019 in [facility] Total Score  6  1  0  0  0  PHQ-9 Total Score  12  -  -  -  -      Assessment and Plan:   At this time this patient's major problem is that of major depression.  In the geriatric population it often appears as more anxiety than anything.  Specifically the patient has somatic complaints.  His sleep appetite and energy are all affected.  His concentration is affected.  He shows no evidence of psychosis.  He is not suicidal.  At this time I recommended that he continue the Lexapro 10 mg which is only been on for  about 1 week.  This is the highest dose of this medication can go and I shared with him that it will take 1 to 2 months to have his pulse defect.  Nonetheless at this time we will go also begin him on Remeron 30 mg but I hope will help his depression improve his sleep and his appetite.  The patient will go ahead and begin the Remeron went right away.  The other adjustment will make is to increase his BuSpar to a more appropriate dose of 15 mg 1 in the morning and 2 at night.  If this patient is not sleeping better in 1 week we will go ahead and begin him on Restoril 15 mg at night and he may repeat it.  We will first see how he does on the Remeron for sleep.  The possibility of being more aggressive in treating his anxiety will be considered when he returns in about 6 or 7 weeks.  The possibility of using low-dose Ativan is not out of the question.  We will first give BuSpar chance to work and if it does not we will discontinue it.   Jerral Ralph, MD 3/10/20213:51 PM

## 2019-09-26 NOTE — Patient Instructions (Addendum)
If you are age 76 or older, your body mass index should be between 23-30. Your Body mass index is 31.09 kg/m. If this is out of the aforementioned range listed, please consider follow up with your Primary Care Provider.  If you are age 27 or younger, your body mass index should be between 19-25. Your Body mass index is 31.09 kg/m. If this is out of the aformentioned range listed, please consider follow up with your Primary Care Provider.   Add stool softener.  You have been given the phone number to [facility]. If you decide to proceed with testing you will need to come back to the office to pick up kit.  We will call you with results.  You have been given Gas literature.  Thank you for choosing me and [facility].   Tye Savoy, NP  You have been given a testing kit to check for small intestine bacterial overgrowth (SIBO) which is completed by a company named [facility]. Make sure to return your test in the mail using the return mailing label given you along with the kit. Your demographic and insurance information have already been sent to the company and they should be in contact with you over the next week regarding this test. Make sure to discuss with [facility] PRIOR to having the test if they have gotten informatoin from your insurance company as to how much your testing will cost out of pocket, if any. Please keep in mind that you will be getting a call from phone number [phone] or a similar number. If you do not hear from them within this time frame, please call our office at [phone].

## 2019-09-26 NOTE — Progress Notes (Signed)
         IMPRESSION and PLAN:    George Wang is a 73 y.o. male with a pmh significant for, not necessarily limited to prostate cancer, DM2, hypertension, colon polyps, gastritis, anxiety, GERD   # GERD    --Asymptomatic on Prilosec. Pepcid recently added for gas but no improvement --We discussed stopping pepcid since it hasn't help with gas / belching . He will continue daily Prilosec   #  Belching / flatulence --Seen in ED late February with nominal discomfort, belching, flatulence.  CT scan w/ contrast and labs unrevealing.  --low gas diet brochure given --? Possibly component of aerophagia secondary to anxiety --could be diet related and / or SIBO given history of diabetes.  --patient will call [facility] to inquire whether his insurance will cover SIBO testing.    # Altered bowel habits.   --Possibly mild constipation after recently starting Buspar. --Appointment with psychiatry today.  Patient does not feel like BuSpar is helpful.  If his partners discontinued his bowel movements may return to normal.  If not the patient will call the office at which time I will likely start him on MiraLAX.  I do not want to start fiber right now because it may exacerbate the belching / flatulence  # Anxiety over health -Patient says he is extremely worried about cancer as cause for his intestinal gas / flatus / grumbling in abdomen -Reassurance provided.  I reviewed the CT scan and labs with patient. He is up to date on colonoscopy.  I told him that belching / flatus / intestinal rumbling is almost always benign.  -He has appt with Psychiatry today   HPI:    Primary GI: Dr. Pyrtle  Chief complaint : belching, flatulence, stomach rumbling  **History comes from the chart,  Patient and wife  George Wang is known to Dr. Pyrtle from screening colonoscopy December 2019.  He is here today following an ED visit 09/16/2019 for evaluation of above complaints.  Patient admits to significant  anxiety, especially involving his health.  He is concerned about an underlying malignancy as cause for intestinal gas / belching. He has a longstanding history of anxiety but it has gotten worse. He has an appt today with Psychiatry. On Buspar, lexapro and Atarax but still anxious / worried.  The belching and gas occur randomly, not necessarily meal related. No abdominal pain or nausea / vomiting.  Symptoms started several months ago. He hasn't changed his diet. Over last several days he has has mild constipation which may be related to Buspar and also diminished PO intake secondary to worrying. He has taken daily Citrucel for years.  He has a history of GERD, symptoms were controlled on Prilosec.  PCP added Pepcid to see if it would help with the gas but patient has not noticed any difference   Data Reviewed:   09/16/19 Hgb 12.8 - at baseline  AST 43, liver tests o/w normal.  CT scan w/ contrast - no acute findings  Review of systems:     No chest pain, no SOB, no fevers, no urinary sx   Past Medical History:  Diagnosis Date  . Allergy    mild  . Anemia    Iron deficiency  . Anxiety state, unspecified   . Aortic atherosclerosis ([facility])   . Arthritis   . Calculus of kidney    kidney stones  - normal BMP   . Cancer ([facility])    Prostate, skin cancer x 2   .   Cataract    forming both eyes  . Depressive disorder, not elsewhere classified   . Diverticulosis of colon (without mention of hemorrhage)   . GERD (gastroesophageal reflux disease)    on nexium  . Gout, unspecified   . Heart murmur   . Hyperlipidemia   . Hypertension, essential   . Irritable bowel syndrome   . Neuromuscular disorder ([facility])    numbness in knees from back issues   . Personal history of malignant neoplasm of prostate   . PVC (premature ventricular contraction)   . Type II or unspecified type diabetes mellitus without mention of complication, not stated as uncontrolled     Patient's surgical history, family medical  history, social history, medications and allergies were all reviewed in Epic   Serum creatinine: 0.79 mg/dL 09/16/19 1815 Estimated creatinine clearance: 74.5 mL/min  Current Outpatient Medications  Medication Sig Dispense Refill  . acetaminophen (TYLENOL) 325 MG tablet Take 650 mg by mouth every 6 (six) hours as needed for mild pain.    Marland Kitchen amLODipine (NORVASC) 10 MG tablet Take 1 tablet (10 mg total) by mouth daily. 90 tablet 3  . atorvastatin (LIPITOR) 80 MG tablet Take 0.5 tablets (40 mg total) by mouth daily at 6 PM. 45 tablet 3  . beta carotene 25000 UNIT capsule Take 25,000 Units by mouth daily.    . Blood Glucose Calibration (OT ULTRA/FASTTK CNTRL SOLN) SOLN Use with meter as directed Dx E11.9 1 each 1  . blood glucose meter kit and supplies Dispense based on patient and insurance preference. Use up to four times daily as directed. (FOR ICD-10 E10.9, E11.9). 1 each 1  . busPIRone (BUSPAR) 15 MG tablet Take 1-2 tablets by mouth 3 (three) times daily as needed.    . chlorthalidone (HYGROTON) 25 MG tablet TAKE 1 TABLET DAILY FOR BLOOD PRESSURE (Patient taking differently: Take 25 mg by mouth daily. ) 30 tablet 4  . Cholecalciferol (VITAMIN D) 2000 UNITS CAPS Take 2 capsules by mouth daily.     . diphenhydramine-acetaminophen (TYLENOL PM) 25-500 MG TABS tablet Take 1 tablet by mouth at bedtime as needed (sleep or pain).    Marland Kitchen escitalopram (LEXAPRO) 5 MG tablet Take 1 tablet (5 mg total) by mouth daily for 7 days, THEN 2 tablets (10 mg total) daily for 21 days. 49 tablet 0  . famotidine (PEPCID) 20 MG tablet Take 1 tablet (20 mg total) by mouth 2 (two) times daily as needed for heartburn or indigestion. 180 tablet 3  . FeFum-FePo-FA-B Cmp-C-Zn-Mn-Cu (SE-TAN PLUS) 162-115.2-1 MG CAPS Take 1 capsule by mouth 2 (two) times daily. 180 capsule 3  . Fiber POWD Take 1 Scoop by mouth daily as needed (bowel movement).     . fish oil-omega-3 fatty acids 1000 MG capsule Take 1 g by mouth 2 (two) times  daily.     Marland Kitchen glimepiride (AMARYL) 4 MG tablet Take 0.5 tablets (2 mg total) by mouth 2 (two) times daily. 180 tablet 3  . glucose blood test strip 1 each by Other route 4 (four) times daily. 100 each 12  . hydrOXYzine (ATARAX/VISTARIL) 25 MG tablet Take 1 tablet (25 mg total) by mouth 3 (three) times daily as needed for anxiety. 30 tablet 0  . losartan (COZAAR) 100 MG tablet Take 1 tablet (100 mg total) by mouth daily. 90 tablet 3  . metFORMIN (GLUCOPHAGE) 500 MG tablet TAKE 1 TABLET EACH MORNING AND 1 TABLETEACH EVENING. (Patient taking differently: Take 500-1,000 mg by mouth See admin  instructions. Take 2 tablets (1,000mg) by mouth every morning and 1 tablet (500mg) by mouth every evening.) 180 tablet 3  . Multiple Vitamins-Minerals (CENTRUM SILVER PO) Take 1 tablet by mouth daily.     . omeprazole (PRILOSEC) 20 MG capsule Take 1 capsule (20 mg total) by mouth 2 (two) times daily before a meal. TAKE ONE (1) CAPSULE EACH DAY (Patient taking differently: Take 20 mg by mouth 2 (two) times daily before a meal. ) 180 capsule 1  . traMADol (ULTRAM) 50 MG tablet Take 1 tablet (50 mg total) by mouth every 6 (six) hours as needed for moderate pain. 20 tablet 0   No current facility-administered medications for this visit.    Physical Exam:     BP (!) 112/52 (BP Location: Left Arm, Patient Position: Sitting, Cuff Size: Normal)   Pulse 64 Comment: irregular  Temp 97.9 F (36.6 C)   Ht 5' 3" (1.6 m) Comment: height measured without shoes  Wt 175 lb 8 oz (79.6 kg)   BMI 31.09 kg/m   GENERAL:  Pleasant male in NAD PSYCH: : Cooperative, normal affect CARDIAC:  RRR, no peripheral edema PULM: Normal respiratory effort, lungs CTA bilaterally, no wheezing ABDOMEN:  Nondistended, soft, nontender. No obvious masses, no hepatomegaly,  normal bowel sounds SKIN:  turgor, no lesions seen Musculoskeletal:  Normal muscle tone, normal strength NEURO: Alert and oriented x 3, no focal neurologic  deficits   Paula Guenther , NP 09/26/2019, 1:27 PM  

## 2019-10-01 ENCOUNTER — Telehealth: Payer: Self-pay | Admitting: Nurse Practitioner

## 2019-10-01 NOTE — Telephone Encounter (Signed)
Patient's wife is calling states he is to take a breath test but he is also a diabetic they are concerned because she said the instructions say he can not eat and they are worried his sugar level will be low.

## 2019-10-02 NOTE — Telephone Encounter (Signed)
Spoke to the patients wife who stated the breath test directions instructed the patient to not have anything PO for 12 hours. I told her any questions about the breath test needed to be directed to [facility]. She stated she called them who in turn told her to call us if the patient could not physically complete the instructions of the test. She then said, "well, I don't know if he can go the 12 hours without his sugar dropping, but if it drops, he's going to need something." She is unsure of if he will take the test or not. Please be advised.

## 2019-10-03 ENCOUNTER — Other Ambulatory Visit (HOSPITAL_COMMUNITY): Payer: Self-pay | Admitting: *Deleted

## 2019-10-03 ENCOUNTER — Telehealth (HOSPITAL_COMMUNITY): Payer: Self-pay | Admitting: *Deleted

## 2019-10-03 DIAGNOSIS — F411 Generalized anxiety disorder: Secondary | ICD-10-CM

## 2019-10-03 DIAGNOSIS — F329 Major depressive disorder, single episode, unspecified: Secondary | ICD-10-CM

## 2019-10-03 DIAGNOSIS — F32A Depression, unspecified: Secondary | ICD-10-CM

## 2019-10-03 MED ORDER — ESCITALOPRAM OXALATE 10 MG PO TABS: ORAL_TABLET | ORAL | 3 refills | Status: DC

## 2019-10-03 NOTE — Telephone Encounter (Signed)
If he cannot go 12 hours without his blood sugar dropping then I guess the test cannot be done.  I will see if we can get him antibiotics and treat him as if he has small bowel bacterial overgrowth but sometimes the medication is hard to get an expensive for the patient to pay for without a definite diagnosis

## 2019-10-03 NOTE — Progress Notes (Signed)
Addendum: Reviewed and agree with assessment and management plan. Hildy Nicholl M, MD  

## 2019-10-03 NOTE — Telephone Encounter (Signed)
Writer returned wife's t/c regarding refills on medications. Writer spoke with both pt and wife.they say his PCP added and/or changed dosages on Lexapro and Buspar. Pt is taking Remeron ut feels Restoril too strong. Writer assured pt that he has refills waiting at pharmacy. But pt says his anxiety has increased dramatically and he feels like he's "climbing up a wall' or like a "drug addict must feel when they need drugs". Pt also c/o depression no better and states that he can't wait until 12/07/19 to see MD again. Writer will check on earlier appointment. Please review and advise.

## 2019-10-05 NOTE — Telephone Encounter (Signed)
Do I need to send in a Xifaxan prescription?

## 2019-10-08 ENCOUNTER — Ambulatory Visit: Payer: Medicare Other | Admitting: Family

## 2019-10-08 NOTE — Telephone Encounter (Signed)
Left a message to call back to discuss this.

## 2019-10-08 NOTE — Telephone Encounter (Signed)
Patient is returning your call.  

## 2019-10-08 NOTE — Telephone Encounter (Signed)
Yes please, it is the 550 mg TID x 14 days. Thanks

## 2019-10-09 ENCOUNTER — Ambulatory Visit (HOSPITAL_COMMUNITY): Payer: Medicare Other | Admitting: Psychiatry

## 2019-10-09 ENCOUNTER — Other Ambulatory Visit: Payer: Self-pay

## 2019-10-09 MED ORDER — RIFAXIMIN 550 MG PO TABS: 550.0000 mg | ORAL_TABLET | Freq: Three times a day (TID) | ORAL | 0 refills | Status: DC

## 2019-10-09 NOTE — Telephone Encounter (Signed)
Spoke with Mrs. Alroy Dust. She will wait to hear from the pharmacy about the medication and if it will be covered by the insurance.

## 2019-10-12 ENCOUNTER — Other Ambulatory Visit: Payer: Self-pay

## 2019-10-12 MED ORDER — RIFAXIMIN 550 MG PO TABS: 550.0000 mg | ORAL_TABLET | Freq: Three times a day (TID) | ORAL | 0 refills | Status: AC

## 2019-10-12 NOTE — Telephone Encounter (Signed)
Pt's wife called and inquired about Xifaxan samples.

## 2019-10-12 NOTE — Telephone Encounter (Signed)
No samples available. Rx sent to [facility] in hopes of help getting medication.

## 2019-10-12 NOTE — Telephone Encounter (Signed)
Spouse advised of this plan.

## 2019-10-15 ENCOUNTER — Telehealth: Payer: Self-pay | Admitting: Internal Medicine

## 2019-10-18 ENCOUNTER — Telehealth (HOSPITAL_COMMUNITY): Payer: Self-pay | Admitting: *Deleted

## 2019-10-18 ENCOUNTER — Other Ambulatory Visit (HOSPITAL_COMMUNITY): Payer: Self-pay | Admitting: *Deleted

## 2019-10-18 MED ORDER — LORAZEPAM 0.5 MG PO TABS: 0.5000 mg | ORAL_TABLET | Freq: Every morning | ORAL | 3 refills | Status: DC

## 2019-10-18 NOTE — Telephone Encounter (Signed)
Writer spoke with pt wife, [name], regarding pt c/o increased anxiety and feeling "jittery". Pt also c/o "cold sweats" which [name] says therapist told them are probably panic attacks. This nurse has spoken with Dr. Casimiro Needle and he has given pt earlier appointment and added Ativan 0.5mg  qd to pt regime. Nurse instructed pt to take once a day, everyday per order. Wife was educated not to mix with hs medications however she reports he's sleeping well at the moment. Nancy verbalizes understanding.

## 2019-11-06 ENCOUNTER — Encounter (HOSPITAL_COMMUNITY): Payer: Self-pay | Admitting: Psychiatry

## 2019-11-06 ENCOUNTER — Other Ambulatory Visit: Payer: Self-pay

## 2019-11-06 ENCOUNTER — Ambulatory Visit (INDEPENDENT_AMBULATORY_CARE_PROVIDER_SITE_OTHER): Payer: Medicare Other | Admitting: Psychiatry

## 2019-11-06 DIAGNOSIS — F325 Major depressive disorder, single episode, in full remission: Secondary | ICD-10-CM

## 2019-11-06 MED ORDER — LORAZEPAM 0.5 MG PO TABS: 0.5000 mg | ORAL_TABLET | Freq: Every morning | ORAL | 3 refills | Status: DC

## 2019-11-06 MED ORDER — MIRTAZAPINE 30 MG PO TABS: ORAL_TABLET | ORAL | 4 refills | Status: DC

## 2019-11-06 NOTE — Progress Notes (Signed)
Psychiatric Initial Adult Assessment   Patient Identification: George Wang MRN:  [id number] Date of Evaluation:  11/06/2019 Referral Source: Community/emergency room Chief Complaint:   Visit Diagnosis: Major depression  History of Present Illness:    Today the patient is doing much better.  His anxiety is just about gone.  He is seen with his wife George Wang.  The patient was started on an antibiotic for what is considered to C[place].  After taking this antibiotic he did not feel much different for for 5 days until he started Ativan 0.5 mg in the morning.  Once he started the antianxiety medicine Ativan 2 days later he felt remarkably improved.  His anxiety has been gone since.  He is now back to sleeping very well.  He was taking Restoril for sleep but no longer needs that.  The patient is appetite is returned.  He is got good energy.  He can think and concentrate.  He is enjoying going to work once again.  His wife says he is essentially back to his old self.  The patient shares with me that he no longer is on that antibiotic but he continues taking Ativan which he believes is helping a great deal.  He questions the need to continue BuSpar.  Patient drinks no alcohol uses no drugs.  He is functioning very well.  He denies any somatic complaints.  He denies nausea vomiting diarrhea or the lack of an appetite.  His somatic symptoms are dramatically improved.  Associated Signs/Symptoms: Depression Symptoms:  depressed mood, (Hypo) Manic Symptoms:   Anxiety Symptoms:  Excessive Worry, Psychotic Symptoms:   PTSD Symptoms:   Past Psychiatric History: 1 psychiatric hospitalization in 1989 presently on Lexapro 10 mg, BuSpar 30 mg  Previous Psychotropic Medications: Yes   Substance Abuse History in the last 12 months:  No.  Consequences of Substance Abuse:   Past Medical History:  Past Medical History:  Diagnosis Date  . Allergy    mild  . Anemia    Iron deficiency  . Anxiety state,  unspecified   . Aortic atherosclerosis ([place])   . Arthritis   . Calculus of kidney    kidney stones  - normal BMP   . Cancer [facility])    Prostate, skin cancer x 2   . Cataract    forming both eyes  . Depressive disorder, not elsewhere classified   . Diverticulosis of colon (without mention of hemorrhage)   . GERD (gastroesophageal reflux disease)    on nexium  . Gout, unspecified   . Heart murmur   . Hyperlipidemia   . Hypertension, essential   . Irritable bowel syndrome   . Neuromuscular disorder ([facility])    numbness in knees from back issues   . Personal history of malignant neoplasm of prostate   . PVC (premature ventricular contraction)   . Type II or unspecified type diabetes mellitus without mention of complication, not stated as uncontrolled     Past Surgical History:  Procedure Laterality Date  . COLONOSCOPY    . HERNIA REPAIR     left side  . PROSTATE SURGERY    . skin cancer removed     chest x 2 3-4 yrs ago   . TONSILLECTOMY      Family Psychiatric History:   Family History:  Family History  Problem Relation Age of Onset  . Cancer Maternal Aunt        breast  . Pancreatic cancer Paternal Aunt   . Prostate cancer  Paternal Uncle   . Heart disease Maternal Grandmother 69  . Heart disease Maternal Grandfather        fluid / CHF   . Heart disease Paternal Grandfather 79  . Heart attack Paternal Grandfather        passed at 3  . Prostate cancer Father   . Coronary artery disease Father 21       CABG  . Hypertension Mother   . Hyperlipidemia Mother   . Other Daughter        accident with tornado 09/25/1996  . Hypertension Son   . Colon cancer Neg Hx   . Colon polyps Neg Hx   . Esophageal cancer Neg Hx   . Rectal cancer Neg Hx   . Stomach cancer Neg Hx     Social History:   Social History   Socioeconomic History  . Marital status: Married    Spouse name: George Wang  . Number of children: 2  . Years of education: 74  . Highest education level: Some college,  no degree  Occupational History  . Occupation: Production designer, theatre/television/film    Comment: Part time  Tobacco Use  . Smoking status: Former Smoker    Packs/day: 1.00    Years: 19.00    Pack years: 19.00    Types: Cigarettes    Quit date: 10/13/1978    Years since quitting: 41.0  . Smokeless tobacco: Former Systems developer    Types: Snuff    Quit date: 05/30/2008  Substance and Sexual Activity  . Alcohol use: Not Currently  . Drug use: No  . Sexual activity: Yes  Other Topics Concern  . Not on file  Social History Narrative   Lives at home with wife.  He has one son and a daughter that died in a tornado in 9. 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 He has one living grandson and one grandson that passed away in 2016/09/25 at age 49 from cancer.   Social Determinants of Health   Financial Resource Strain: Low Risk   . Difficulty of Paying Living Expenses: Not hard at all  Food Insecurity: No Food Insecurity  . Worried About Charity fundraiser in the Last Year: Never true  . Ran Out of Food in the Last Year: Never true  Transportation Needs: No Transportation Needs  . Lack of Transportation (Medical): No  . Lack of Transportation (Non-Medical): No  Physical Activity: Inactive  . Days of Exercise per Week: 0 days  . Minutes of Exercise per Session: 0 min  Stress: No Stress Concern Present  . Feeling of Stress : Only a little  Social Connections: Not Isolated  . Frequency of Communication with Friends and Family: More than three times a week  . Frequency of Social Gatherings with Friends and Family: More than three times a week  . Attends Religious Services: More than 4 times per year  . Active Member of Clubs or Organizations: Yes  . Attends Archivist Meetings: More than 4 times per year  . Marital Status: Married    Additional Social History:   Allergies:  No Known Allergies  Metabolic Disorder Labs: Lab Results  Component Value Date   HGBA1C 6.6 08/06/2019    No results found for: PROLACTIN Lab Results  Component Value Date   CHOL 101 04/06/2019   TRIG 67 04/06/2019   HDL 34 (L) 04/06/2019   CHOLHDL 3.0 04/06/2019   LDLCALC 52 04/06/2019   LDLCALC 47 11/03/2018  Lab Results  Component Value Date   TSH 1.330 12/05/2018    Therapeutic Level Labs: No results found for: LITHIUM No results found for: CBMZ No results found for: VALPROATE  Current Medications: Current Outpatient Medications  Medication Sig Dispense Refill  . acetaminophen (TYLENOL) 325 MG tablet Take 650 mg by mouth every 6 (six) hours as needed for mild pain.    Marland Kitchen amLODipine (NORVASC) 10 MG tablet Take 1 tablet (10 mg total) by mouth daily. 90 tablet 3  . atorvastatin (LIPITOR) 80 MG tablet Take 0.5 tablets (40 mg total) by mouth daily at 6 PM. 45 tablet 3  . beta carotene 25000 UNIT capsule Take 25,000 Units by mouth daily.    . Blood Glucose Calibration (OT ULTRA/FASTTK CNTRL SOLN) SOLN Use with meter as directed Dx E11.9 1 each 1  . blood glucose meter kit and supplies Dispense based on patient and insurance preference. Use up to four times daily as directed. (FOR ICD-10 E10.9, E11.9). 1 each 1  . busPIRone (BUSPAR) 15 MG tablet 1  qam   2  qhs 90 tablet 5  . chlorthalidone (HYGROTON) 25 MG tablet TAKE 1 TABLET DAILY FOR BLOOD PRESSURE (Patient taking differently: Take 25 mg by mouth daily. ) 30 tablet 4  . Cholecalciferol (VITAMIN D) 2000 UNITS CAPS Take 2 capsules by mouth daily.     . diphenhydramine-acetaminophen (TYLENOL PM) 25-500 MG TABS tablet Take 1 tablet by mouth at bedtime as needed (sleep or pain).    Marland Kitchen escitalopram (LEXAPRO) 10 MG tablet Take one tablet by mouth every morning. 30 tablet 3  . famotidine (PEPCID) 20 MG tablet Take 1 tablet (20 mg total) by mouth 2 (two) times daily as needed for heartburn or indigestion. 180 tablet 3  . FeFum-FePo-FA-B Cmp-C-Zn-Mn-Cu (SE-TAN PLUS) 162-115.2-1 MG CAPS Take 1 capsule by mouth 2 (two) times daily. 180 capsule  3  . Fiber POWD Take 1 Scoop by mouth daily as needed (bowel movement).     . fish oil-omega-3 fatty acids 1000 MG capsule Take 1 g by mouth 2 (two) times daily.     Marland Kitchen glimepiride (AMARYL) 4 MG tablet Take 0.5 tablets (2 mg total) by mouth 2 (two) times daily. 180 tablet 3  . glucose blood test strip 1 each by Other route 4 (four) times daily. 100 each 12  . hydrOXYzine (ATARAX/VISTARIL) 25 MG tablet Take 1 tablet (25 mg total) by mouth 3 (three) times daily as needed for anxiety. 30 tablet 0  . LORazepam (ATIVAN) 0.5 MG tablet Take 1 tablet (0.5 mg total) by mouth in the morning. 30 tablet 3  . losartan (COZAAR) 100 MG tablet Take 1 tablet (100 mg total) by mouth daily. 90 tablet 3  . metFORMIN (GLUCOPHAGE) 500 MG tablet TAKE 1 TABLET EACH MORNING AND 1 TABLETEACH EVENING. (Patient taking differently: Take 500-1,000 mg by mouth See admin instructions. Take 2 tablets (1,'000mg'$ ) by mouth every morning and 1 tablet ('500mg'$ ) by mouth every evening.) 180 tablet 3  . mirtazapine (REMERON) 30 MG tablet 1  qhs 30 tablet 4  . Multiple Vitamins-Minerals (CENTRUM SILVER PO) Take 1 tablet by mouth daily.     Marland Kitchen omeprazole (PRILOSEC) 20 MG capsule Take 1 capsule (20 mg total) by mouth 2 (two) times daily before a meal. TAKE ONE (1) CAPSULE EACH DAY (Patient taking differently: Take 20 mg by mouth 2 (two) times daily before a meal. ) 180 capsule 1  . temazepam (RESTORIL) 15 MG capsule 1 QHS and  if fails after 3 days then 2  qhs 60 capsule 3  . traMADol (ULTRAM) 50 MG tablet Take 1 tablet (50 mg total) by mouth every 6 (six) hours as needed for moderate pain. 20 tablet 0   No current facility-administered medications for this visit.    Musculoskeletal: Strength & Muscle Tone: within normal limits Gait & Station: normal Patient leans: N/A  Psychiatric Specialty Exam: Review of Systems  There were no vitals taken for this visit.There is no height or weight on file to calculate BMI.  General Appearance: Casual   Eye Contact:  Good  Speech:  Clear and Coherent  Volume:  Normal  Mood:  Depressed  Affect:  Appropriate  Thought Process:  Coherent  Orientation:  Full (Time, Place, and Person)  Thought Content:  WDL  Suicidal Thoughts:  No  Homicidal Thoughts:  No  Memory:  Negative  Judgement:  Good  Insight:  Good  Psychomotor Activity:  Normal  Concentration:    Recall:  Good  Fund of Knowledge:Fair  Language: Good  Akathisia:  No  Handed:  Right  AIMS (if indicated):  not done  Assets:  Desire for Improvement  ADL's:  Intact  Cognition: WNL  Sleep:  Good   Screenings: GAD-7     Office Visit from 09/10/2019 in [place]  Total GAD-7 Score  12    Mini-Mental     Clinical Support from 05/16/2017 in [place]  Total Score (max 30 points )  29    PHQ2-9     Office Visit from 09/10/2019 in [place] Visit from 08/06/2019 in [place] from 07/06/2019 in [place] Office Visit from 04/06/2019 in [place] Visit from 01/17/2019 in [place]  PHQ-2 Total Score  6  1  0  0  0  PHQ-9 Total Score  12  -  -  -  -      Assessment and Plan:   At this time the patient is showing significant response to low-dose Ativan 0.5 mg.  It should be also noted that he also is taking Remeron 30 mg which I think also is contributed.  I am not clear if the antibiotic made any difference.  He had been on it for over 5 days with little effect from it.  I think what cause much of his improvement is the Ativan together with the Remeron.  He now rarely needs to take the Restoril.  We talked about the possibility of discontinuing his BuSpar in the future.  He will continue taking this medication regime and return to see me in 2 months.  He did see the therapist 1 time but apparently was not all that helpful.  He got  lost to follow-up with the therapist.  Now we will hold off any therapy but he will continue taking Remeron 30 mg and Ativan in the morning and continue the BuSpar for the time being.  The patient is doing very well.  Patient therefore has 2 problems major depression and insomnia.  Both of them seem to be pretty much resolved at this time.   Jerral Ralph, MD 4/20/20212:03 PM

## 2019-11-20 ENCOUNTER — Ambulatory Visit (HOSPITAL_COMMUNITY): Payer: Medicare Other | Admitting: Psychiatry

## 2019-12-05 ENCOUNTER — Encounter: Payer: Self-pay | Admitting: Family Medicine

## 2019-12-05 ENCOUNTER — Other Ambulatory Visit: Payer: Self-pay

## 2019-12-05 ENCOUNTER — Ambulatory Visit (INDEPENDENT_AMBULATORY_CARE_PROVIDER_SITE_OTHER): Payer: Medicare Other | Admitting: Family Medicine

## 2019-12-05 VITALS — BP 124/64 | HR 47 | Temp 97.9°F | Ht 63.0 in | Wt 182.5 lb

## 2019-12-05 DIAGNOSIS — F329 Major depressive disorder, single episode, unspecified: Secondary | ICD-10-CM | POA: Diagnosis not present

## 2019-12-05 DIAGNOSIS — E1169 Type 2 diabetes mellitus with other specified complication: Secondary | ICD-10-CM | POA: Diagnosis not present

## 2019-12-05 DIAGNOSIS — I10 Essential (primary) hypertension: Secondary | ICD-10-CM

## 2019-12-05 DIAGNOSIS — E782 Mixed hyperlipidemia: Secondary | ICD-10-CM

## 2019-12-05 DIAGNOSIS — F32A Depression, unspecified: Secondary | ICD-10-CM

## 2019-12-05 LAB — BAYER DCA HB A1C WAIVED: HB A1C (BAYER DCA - WAIVED): 6 % (ref <7.0)

## 2019-12-05 NOTE — Patient Instructions (Signed)
Discussed possibly trialing off of the glimepiride and see how his blood sugars do.

## 2019-12-05 NOTE — Progress Notes (Signed)
BP 124/64   Pulse (!) 47   Temp 97.9 F (36.6 C) (Temporal)   Ht 5' 3"  (1.6 m)   Wt 182 lb 8 oz (82.8 kg)   BMI 32.33 kg/m    Subjective:   Patient ID: George Wang, male    DOB: 07/31/1945, 74 y.o.   MRN: [id number]  HPI: George Wang is a 74 y.o. male presenting on 12/05/2019 for Medical Management of Chronic Issues   HPI Depression Patient is now seeing a psychiatrist for depression and they are helping with his depression and anxiety.  Type 2 diabetes mellitus Patient comes in today for recheck of his diabetes. Patient has been currently taking glimepiride 2 mg twice daily and Metformin 1000 mg a.m. and 500 mg in the evening. Patient is currently on an ACE inhibitor/ARB. Patient has not seen an ophthalmologist this year. Patient denies any issues with their feet. The symptom started onset as an adult hypertension and hyperlipidemia ARE RELATED TO DM.  A1c is 6.0 today, he is getting occasional low blood sugars.  Will recommend that he trial off of the glimepiride  Hypertension Patient is currently on amlodipine and losartan and chlorthalidone, and their blood pressure today is 124/64. Patient denies any lightheadedness or dizziness. Patient denies headaches, blurred vision, chest pains, shortness of breath, or weakness. Denies any side effects from medication and is content with current medication.   Hyperlipidemia Patient is coming in for recheck of his hyperlipidemia. The patient is currently taking fish oils and atorvastatin. They deny any issues with myalgias or history of liver damage from it. They deny any focal numbness or weakness or chest pain.   Relevant past medical, surgical, family and social history reviewed and updated as indicated. Interim medical history since our last visit reviewed. Allergies and medications reviewed and updated.  Review of Systems  Constitutional: Negative for chills and fever.  Eyes: Negative for visual disturbance.  Respiratory:  Negative for shortness of breath and wheezing.   Cardiovascular: Negative for chest pain and leg swelling.  Musculoskeletal: Negative for back pain and gait problem.  Skin: Negative for rash.  Neurological: Negative for dizziness, weakness and light-headedness.  All other systems reviewed and are negative.   Per HPI unless specifically indicated above   Allergies as of 12/05/2019   No Known Allergies     Medication List       Accurate as of Dec 05, 2019 11:28 AM. If you have any questions, ask your nurse or doctor.        STOP taking these medications   famotidine 20 MG tablet Commonly known as: Pepcid Stopped by: Fransisca Kaufmann Preslei Blakley, MD   hydrOXYzine 25 MG tablet Commonly known as: ATARAX/VISTARIL Stopped by: Fransisca Kaufmann Della Homan, MD     TAKE these medications   acetaminophen 325 MG tablet Commonly known as: TYLENOL Take 650 mg by mouth every 6 (six) hours as needed for mild pain.   amLODipine 10 MG tablet Commonly known as: NORVASC Take 1 tablet (10 mg total) by mouth daily.   atorvastatin 80 MG tablet Commonly known as: LIPITOR Take 0.5 tablets (40 mg total) by mouth daily at 6 PM.   beta carotene 25000 UNIT capsule Take 25,000 Units by mouth daily.   blood glucose meter kit and supplies Dispense based on patient and insurance preference. Use up to four times daily as directed. (FOR ICD-10 E10.9, E11.9).   busPIRone 15 MG tablet Commonly known as: BUSPAR 1  qam  2  qhs   CENTRUM SILVER PO Take 1 tablet by mouth daily.   chlorthalidone 25 MG tablet Commonly known as: HYGROTON TAKE 1 TABLET DAILY FOR BLOOD PRESSURE What changed: additional instructions   diphenhydramine-acetaminophen 25-500 MG Tabs tablet Commonly known as: TYLENOL PM Take 1 tablet by mouth at bedtime as needed (sleep or pain).   escitalopram 10 MG tablet Commonly known as: Lexapro Take one tablet by mouth every morning.   Fiber Powd Take 1 Scoop by mouth daily as needed (bowel  movement).   fish oil-omega-3 fatty acids 1000 MG capsule Take 1 g by mouth 2 (two) times daily.   glimepiride 4 MG tablet Commonly known as: AMARYL Take 0.5 tablets (2 mg total) by mouth 2 (two) times daily.   glucose blood test strip 1 each by Other route 4 (four) times daily.   LORazepam 0.5 MG tablet Commonly known as: Ativan Take 1 tablet (0.5 mg total) by mouth in the morning.   losartan 100 MG tablet Commonly known as: COZAAR Take 1 tablet (100 mg total) by mouth daily.   metFORMIN 500 MG tablet Commonly known as: GLUCOPHAGE TAKE 1 TABLET EACH MORNING AND 1 TABLETEACH EVENING. What changed:   how much to take  how to take this  when to take this  additional instructions   mirtazapine 30 MG tablet Commonly known as: Remeron 1  qhs   omeprazole 20 MG capsule Commonly known as: PRILOSEC Take 1 capsule (20 mg total) by mouth 2 (two) times daily before a meal. TAKE ONE (1) CAPSULE EACH DAY What changed: additional instructions   OT ULTRA/FASTTK CNTRL SOLN Soln Use with meter as directed Dx E11.9   Se-Tan PLUS 162-115.2-1 MG Caps Take 1 capsule by mouth 2 (two) times daily.   temazepam 15 MG capsule Commonly known as: RESTORIL 1 QHS and if fails after 3 days then 2  qhs   traMADol 50 MG tablet Commonly known as: ULTRAM Take 1 tablet (50 mg total) by mouth every 6 (six) hours as needed for moderate pain.   Vitamin D 50 MCG (2000 UT) Caps Take 2 capsules by mouth daily.        Objective:   BP 124/64   Pulse (!) 47   Temp 97.9 F (36.6 C) (Temporal)   Ht '5\' 3"'$  (1.6 m)   Wt 182 lb 8 oz (82.8 kg)   BMI 32.33 kg/m   Wt Readings from Last 3 Encounters:  12/05/19 182 lb 8 oz (82.8 kg)  09/26/19 175 lb 8 oz (79.6 kg)  09/10/19 179 lb 9.6 oz (81.5 kg)    Physical Exam Vitals and nursing note reviewed.  Constitutional:      General: He is not in acute distress.    Appearance: He is well-developed. He is not diaphoretic.  Eyes:     General: No  scleral icterus.    Conjunctiva/sclera: Conjunctivae normal.  Neck:     Thyroid: No thyromegaly.  Cardiovascular:     Rate and Rhythm: Normal rate and regular rhythm.     Heart sounds: Normal heart sounds. No murmur.  Pulmonary:     Effort: Pulmonary effort is normal. No respiratory distress.     Breath sounds: Normal breath sounds. No wheezing.  Musculoskeletal:        General: Normal range of motion.     Cervical back: Neck supple.  Lymphadenopathy:     Cervical: No cervical adenopathy.  Skin:    General: Skin is warm and dry.  Findings: No rash.  Neurological:     Mental Status: He is alert and oriented to person, place, and time.     Coordination: Coordination normal.  Psychiatric:        Behavior: Behavior normal.       Assessment & Plan:   Problem List Items Addressed This Visit      Cardiovascular and Mediastinum   Essential hypertension   Relevant Orders   CMP14+EGFR     Endocrine   Type 2 diabetes mellitus ([place]) - Primary   Relevant Orders   Microalbumin / creatinine urine ratio   Bayer DCA Hb A1c Waived   CMP14+EGFR     Other   Depression   Hyperlipemia      Gave instructions for patient to maybe trial off of the glimepiride or at least the evening dose of the glimepiride and see how his blood sugars are doing so at least he does not keep down at night. Follow up plan: Return in about 3 months (around 03/06/2020), or if symptoms worsen or fail to improve, for Diabetes and hypertension recheck.  Counseling provided for all of the vaccine components Orders Placed This Encounter  Procedures  . Microalbumin / creatinine urine ratio  . [facility] Hb A1c [place], MD [facility] 12/05/2019, 11:28 AM

## 2019-12-06 LAB — MICROALBUMIN / CREATININE URINE RATIO
Creatinine, Urine: 142 mg/dL
Microalb/Creat Ratio: 4 mg/g creat (ref 0–29)
Microalbumin, Urine: 5 ug/mL

## 2019-12-06 LAB — CMP14+EGFR
ALT: 15 IU/L (ref 0–44)
AST: 54 IU/L — ABNORMAL HIGH (ref 0–40)
Albumin/Globulin Ratio: 2.3 — ABNORMAL HIGH (ref 1.2–2.2)
Albumin: 4.2 g/dL (ref 3.7–4.7)
Alkaline Phosphatase: 73 IU/L (ref 48–121)
BUN/Creatinine Ratio: 19 (ref 10–24)
BUN: 20 mg/dL (ref 8–27)
Bilirubin Total: 0.6 mg/dL (ref 0.0–1.2)
CO2: 26 mmol/L (ref 20–29)
Calcium: 9.4 mg/dL (ref 8.6–10.2)
Chloride: 103 mmol/L (ref 96–106)
Creatinine, Ser: 1.06 mg/dL (ref 0.76–1.27)
GFR calc Af Amer: 79 mL/min/{1.73_m2} (ref >59)
GFR calc non Af Amer: 68 mL/min/{1.73_m2} (ref >59)
Globulin, Total: 1.8 g/dL (ref 1.5–4.5)
Glucose: 75 mg/dL (ref 65–99)
Potassium: 4 mmol/L (ref 3.5–5.2)
Sodium: 144 mmol/L (ref 134–144)
Total Protein: 6 g/dL (ref 6.0–8.5)

## 2019-12-06 NOTE — Progress Notes (Signed)
Cardiology Office Note   Date:  12/07/2019   ID:  George Wang, George Wang [birth year], MRN [id number]  PCP:  Dettinger, Fransisca Kaufmann, MD  Cardiologist:   Minus Breeding, MD   Chief Complaint  Patient presents with  . Chest Pain      History of Present Illness: George Wang is a 74 y.o. male who presents for evaluation of PVCs.    I saw him in 2016 for evaluation of PVCs.  He had a negative POET (Plain Old Exercise Treadmill).  He had a distant echo that was also normal and was done to evaluate PVCs.    Since I last saw him he was in the emergency room in February.  I reviewed these records.  He really went in for belching.  However, they did a cardiac work-up to include an EKG and he had negative troponins.  They ultimately did a CT of his abdomen.  He went saw GI and was treated presumptively eventually for H. pylori and he said his symptoms cleared up.  He was really having abdominal discomfort or maybe epigastric discomfort but not substernal chest pain.  He has premature ventricular contractions.  However, he is not really bothered by these.  He does not have any presyncope or syncope.  He denies any substernal chest pressure, neck or arm discomfort.  Is not having any new shortness of breath, PND or orthopnea.  Has had no weight gain or edema.  He works every day.    Past Medical History:  Diagnosis Date  . Allergy    mild  . Anemia    Iron deficiency  . Anxiety state, unspecified   . Aortic atherosclerosis ([place])   . Arthritis   . Calculus of kidney    kidney stones  - normal BMP   . [facility])    Prostate, skin cancer x 2   . Cataract    forming both eyes  . Depressive disorder, not elsewhere classified   . Diverticulosis of colon (without mention of hemorrhage)   . GERD (gastroesophageal reflux disease)    on nexium  . Gout, unspecified   . Heart murmur   . Hyperlipidemia   . Hypertension, essential   . Irritable bowel syndrome   . Neuromuscular disorder (HCC)      numbness in knees from back issues   . Personal history of malignant neoplasm of prostate   . PVC (premature ventricular contraction)   . Type II or unspecified type diabetes mellitus without mention of complication, not stated as uncontrolled     Past Surgical History:  Procedure Laterality Date  . COLONOSCOPY    . HERNIA REPAIR     left side  . PROSTATE SURGERY    . skin cancer removed     chest x 2 3-4 yrs ago   . TONSILLECTOMY       Current Outpatient Medications  Medication Sig Dispense Refill  . acetaminophen (TYLENOL) 325 MG tablet Take 650 mg by mouth every 6 (six) hours as needed for mild pain.    Marland Kitchen amLODipine (NORVASC) 10 MG tablet Take 1 tablet (10 mg total) by mouth daily. 90 tablet 3  . atorvastatin (LIPITOR) 80 MG tablet Take 0.5 tablets (40 mg total) by mouth daily at 6 PM. 45 tablet 3  . beta carotene 25000 UNIT capsule Take 25,000 Units by mouth daily.    . Blood Glucose Calibration (OT ULTRA/FASTTK CNTRL SOLN) SOLN Use with meter as directed Dx E11.9  1 each 1  . blood glucose meter kit and supplies Dispense based on patient and insurance preference. Use up to four times daily as directed. (FOR ICD-10 E10.9, E11.9). 1 each 1  . busPIRone (BUSPAR) 15 MG tablet 1  qam   2  qhs 90 tablet 5  . chlorthalidone (HYGROTON) 25 MG tablet TAKE 1 TABLET DAILY FOR BLOOD PRESSURE (Patient taking differently: Take 25 mg by mouth daily. ) 30 tablet 4  . Cholecalciferol (VITAMIN D) 2000 UNITS CAPS Take 2 capsules by mouth daily.     . diphenhydramine-acetaminophen (TYLENOL PM) 25-500 MG TABS tablet Take 1 tablet by mouth at bedtime as needed (sleep or pain).    Marland Kitchen escitalopram (LEXAPRO) 10 MG tablet Take one tablet by mouth every morning. 30 tablet 3  . FeFum-FePo-FA-B Cmp-C-Zn-Mn-Cu (SE-TAN PLUS) 162-115.2-1 MG CAPS Take 1 capsule by mouth 2 (two) times daily. 180 capsule 3  . Fiber POWD Take 1 Scoop by mouth daily as needed (bowel movement).     . fish oil-omega-3 fatty acids  1000 MG capsule Take 1 g by mouth 2 (two) times daily.     Marland Kitchen glimepiride (AMARYL) 4 MG tablet Take 0.5 tablets (2 mg total) by mouth 2 (two) times daily. 180 tablet 3  . glucose blood test strip 1 each by Other route 4 (four) times daily. 100 each 12  . LORazepam (ATIVAN) 0.5 MG tablet Take 1 tablet (0.5 mg total) by mouth in the morning. 30 tablet 3  . losartan (COZAAR) 100 MG tablet Take 1 tablet (100 mg total) by mouth daily. 90 tablet 3  . metFORMIN (GLUCOPHAGE) 500 MG tablet TAKE 1 TABLET EACH MORNING AND 1 TABLETEACH EVENING. (Patient taking differently: Take 500-1,000 mg by mouth See admin instructions. Take 2 tablets (1,072m) by mouth every morning and 1 tablet (50103m by mouth every evening.) 180 tablet 3  . mirtazapine (REMERON) 30 MG tablet 1  qhs 30 tablet 4  . Multiple Vitamins-Minerals (CENTRUM SILVER PO) Take 1 tablet by mouth daily.     . Marland Kitchenmeprazole (PRILOSEC) 20 MG capsule Take 1 capsule (20 mg total) by mouth 2 (two) times daily before a meal. TAKE ONE (1) CAPSULE EACH DAY (Patient taking differently: Take 20 mg by mouth 2 (two) times daily before a meal. ) 180 capsule 1  . temazepam (RESTORIL) 15 MG capsule 1 QHS and if fails after 3 days then 2  qhs 60 capsule 3  . traMADol (ULTRAM) 50 MG tablet Take 1 tablet (50 mg total) by mouth every 6 (six) hours as needed for moderate pain. 20 tablet 0   No current facility-administered medications for this visit.    Allergies:   Patient has no known allergies.    ROS:  Please see the history of present illness.   Otherwise, review of systems are positive for none.   All other systems are reviewed and negative.    PHYSICAL EXAM: VS:  BP (!) 142/38   Pulse (!) 59   Ht 5' 3" (1.6 m)   Wt 183 lb 12.8 oz (83.4 kg)   SpO2 98%   BMI 32.56 kg/m  , BMI Body mass index is 32.56 kg/m. GENERAL:  Well appearing NECK:  No jugular venous distention, waveform within normal limits, carotid upstroke brisk and symmetric, no bruits, no  thyromegaly LUNGS:  Clear to auscultation bilaterally CHEST:  Unremarkable HEART:  PMI not displaced or sustained,S1 and S2 within normal limits, no S3, no S4, no clicks, no rubs, no  murmurs ABD:  Flat, positive bowel sounds normal in frequency in pitch, no bruits, no rebound, no guarding, no midline pulsatile mass, no hepatomegaly, no splenomegaly EXT:  2 plus pulses throughout, no edema, no cyanosis no clubbing   EKG:  EKG is ordered today. The ekg ordered today demonstrates NSR, rate 72, axis within normal limits, intervals within normal limits, premature ventricular contractions in a bigeminal pattern, no acute ST-T wave changes.   Recent Labs: 09/16/2019: Hemoglobin 12.8; Platelets 232 12/05/2019: ALT 15; BUN 20; Creatinine, Ser 1.06; Potassium 4.0; Sodium 144    Lipid Panel    Component Value Date/Time   CHOL 101 04/06/2019 1508   CHOL 103 11/15/2012 1108   TRIG 67 04/06/2019 1508   TRIG 70 12/30/2016 1151   TRIG 50 11/15/2012 1108   HDL 34 (L) 04/06/2019 1508   HDL 34 (L) 12/30/2016 1151   HDL 37 (L) 11/15/2012 1108   CHOLHDL 3.0 04/06/2019 1508   LDLCALC 52 04/06/2019 1508   LDLCALC 61 01/29/2014 1135   LDLCALC 56 11/15/2012 1108      Wt Readings from Last 3 Encounters:  12/07/19 183 lb 12.8 oz (83.4 kg)  12/05/19 182 lb 8 oz (82.8 kg)  09/26/19 175 lb 8 oz (79.6 kg)      Other studies Reviewed: Additional studies/ records that were reviewed today include: ED records.. Review of the above records demonstrates:  Please see elsewhere in the note.     ASSESSMENT AND PLAN:  PVCs:  Patient is not really noticing PVCs.  No change in therapy.  THORACIC AORTIC ATHEROSCLEROSIS:  We are pursuing aggressive risk reduction.   ABDOMINAL DISCOMFORT: I did review the ED records.  There was a question whether this could be cardiac but it is now resolved.  He had negative enzymes and no acute EKG changes.  At this point no further cardiovascular testing is suggested.  He  had a negative perfusion study in 2019.  DM: A1C wsa 6.0 couple of days ago.  No change in therapy.  DYSLIPIDEMIA: LDL recently was his last LDL was 52 last year.  No change in therapy.    HTN:  His blood pressure is well controlled.  No change in therapy.  COVID-19 EDUCATION: He has had his vaccine.   Current medicines are reviewed at length with the patient today.  The patient does not have concerns regarding medicines.  The following changes have been made:  no change  Labs/ tests ordered today include: None  Orders Placed This Encounter  Procedures  . EKG 12-Lead     Disposition:   FU with me in one year.     Signed, Minus Breeding, MD  12/07/2019 5:10 PM    [facility]

## 2019-12-07 ENCOUNTER — Encounter: Payer: Self-pay | Admitting: Cardiology

## 2019-12-07 ENCOUNTER — Other Ambulatory Visit: Payer: Self-pay

## 2019-12-07 ENCOUNTER — Ambulatory Visit (HOSPITAL_COMMUNITY): Payer: Medicare Other | Admitting: Psychiatry

## 2019-12-07 ENCOUNTER — Ambulatory Visit: Payer: Medicare Other | Admitting: Cardiology

## 2019-12-07 VITALS — BP 142/38 | HR 59 | Ht 63.0 in | Wt 183.8 lb

## 2019-12-07 DIAGNOSIS — E118 Type 2 diabetes mellitus with unspecified complications: Secondary | ICD-10-CM | POA: Diagnosis not present

## 2019-12-07 DIAGNOSIS — Z7189 Other specified counseling: Secondary | ICD-10-CM

## 2019-12-07 DIAGNOSIS — E785 Hyperlipidemia, unspecified: Secondary | ICD-10-CM | POA: Diagnosis not present

## 2019-12-07 DIAGNOSIS — I493 Ventricular premature depolarization: Secondary | ICD-10-CM

## 2019-12-07 DIAGNOSIS — I10 Essential (primary) hypertension: Secondary | ICD-10-CM | POA: Diagnosis not present

## 2019-12-07 DIAGNOSIS — I700 Atherosclerosis of aorta: Secondary | ICD-10-CM | POA: Diagnosis not present

## 2019-12-07 NOTE — Patient Instructions (Addendum)

## 2019-12-10 ENCOUNTER — Telehealth: Payer: Self-pay | Admitting: Family Medicine

## 2019-12-10 DIAGNOSIS — R0789 Other chest pain: Secondary | ICD-10-CM | POA: Diagnosis not present

## 2019-12-10 DIAGNOSIS — I493 Ventricular premature depolarization: Secondary | ICD-10-CM | POA: Diagnosis not present

## 2019-12-10 DIAGNOSIS — K219 Gastro-esophageal reflux disease without esophagitis: Secondary | ICD-10-CM | POA: Diagnosis not present

## 2019-12-10 NOTE — Telephone Encounter (Signed)
Reviewed results with pt and he voiced understanding. 

## 2019-12-11 ENCOUNTER — Other Ambulatory Visit: Payer: Self-pay

## 2019-12-11 ENCOUNTER — Ambulatory Visit (INDEPENDENT_AMBULATORY_CARE_PROVIDER_SITE_OTHER): Payer: Medicare Other | Admitting: Family

## 2019-12-11 ENCOUNTER — Encounter: Payer: Self-pay | Admitting: Family

## 2019-12-11 VITALS — BP 139/66 | HR 67 | Temp 97.5°F | Ht 63.0 in | Wt 178.2 lb

## 2019-12-11 DIAGNOSIS — R0602 Shortness of breath: Secondary | ICD-10-CM | POA: Diagnosis not present

## 2019-12-11 DIAGNOSIS — I493 Ventricular premature depolarization: Secondary | ICD-10-CM

## 2019-12-11 DIAGNOSIS — I700 Atherosclerosis of aorta: Secondary | ICD-10-CM

## 2019-12-11 DIAGNOSIS — I10 Essential (primary) hypertension: Secondary | ICD-10-CM

## 2019-12-11 NOTE — Progress Notes (Signed)
   Subjective:    Patient ID: George Wang, male    DOB: [birth year], 74 y.o.   MRN: [id number]  Chief Complaint  Patient presents with  . breathing difficulty while mowing    HPI PT presents to the office today with complaints of SOB. He reports he was mowing some really tall grass on Friday evening. He states he went home and was still having trouble breathing and hot flashes. He states the SOB resolved about a hour later.   He went to the Urgent Care yesterday and had stable EKG. Thought it was related to his mowing.   He saw his Cardiologists Friday before he mowed and was told he was stable. He has HTN, PVC's, and atherosclerosis.    Review of Systems  Respiratory: Positive for shortness of breath.   All other systems reviewed and are negative.      Objective:   Physical Exam Vitals reviewed.  Constitutional:      General: He is not in acute distress.    Appearance: He is well-developed.  HENT:     Head: Normocephalic.     Right Ear: Tympanic membrane normal.     Left Ear: Tympanic membrane normal.  Eyes:     General:        Right eye: No discharge.        Left eye: No discharge.     Pupils: Pupils are equal, round, and reactive to light.  Neck:     Thyroid: No thyromegaly.  Cardiovascular:     Rate and Rhythm: Normal rate. Rhythm irregular.     Heart sounds: Murmur present.  Pulmonary:     Effort: Pulmonary effort is normal. No respiratory distress.     Breath sounds: Normal breath sounds. No wheezing.  Abdominal:     General: Bowel sounds are normal. There is no distension.     Palpations: Abdomen is soft.     Tenderness: There is no abdominal tenderness.  Musculoskeletal:        General: No tenderness. Normal range of motion.     Cervical back: Normal range of motion and neck supple.  Skin:    General: Skin is warm and dry.     Findings: No erythema or rash.  Neurological:     Mental Status: He is alert and oriented to person, place, and time.   Cranial Nerves: No cranial nerve deficit.     Deep Tendon Reflexes: Reflexes are normal and symmetric.  Psychiatric:        Behavior: Behavior normal.        Thought Content: Thought content normal.        Judgment: Judgment normal.      BP 139/66   Pulse 67   Temp (!) 97.5 F (36.4 C) (Temporal)   Ht 5\' 3"  (1.6 m)   Wt 178 lb 3.2 oz (80.8 kg)   SpO2 97%   BMI 31.57 kg/m      Assessment & Plan:  TENOCH LOWARY comes in today with chief complaint of breathing difficulty while mowing   Diagnosis and orders addressed:  1. SOB (shortness of breath) -Resolved now I believed this was related to pollen/ grass   2. Essential hypertension  3. Frequent PVCs  4. Thoracic aortic atherosclerosis [facility])    Health Maintenance reviewed Diet and exercise encouraged  Follow up plan: Keep follow up with PCP and Cardiologists    Evelina Dun, FNP

## 2019-12-11 NOTE — Patient Instructions (Signed)
Shortness of Breath, Adult Shortness of breath is when a person has trouble breathing enough air or when a person feels like she or he is having trouble breathing in enough air. Shortness of breath could be a sign of a medical problem. Follow these instructions at home:   Pay attention to any changes in your symptoms.  Do not use any products that contain nicotine or tobacco, such as cigarettes, e-cigarettes, and chewing tobacco.  Do not smoke. Smoking is a common cause of shortness of breath. If you need help quitting, ask your health care provider.  Avoid things that can irritate your airways, such as: ? Mold. ? Dust. ? Air pollution. ? Chemical fumes. ? Things that can cause allergy symptoms (allergens), if you have allergies.  Keep your living space clean and free of mold and dust.  Rest as needed. Slowly return to your usual activities.  Take over-the-counter and prescription medicines only as told by your health care provider. This includes oxygen therapy and inhaled medicines.  Keep all follow-up visits as told by your health care provider. This is important. Contact a health care provider if:  Your condition does not improve as soon as expected.  You have a hard time doing your normal activities, even after you rest.  You have new symptoms. Get help right away if:  Your shortness of breath gets worse.  You have shortness of breath when you are resting.  You feel light-headed or you faint.  You have a cough that is not controlled with medicines.  You cough up blood.  You have pain with breathing.  You have pain in your chest, arms, shoulders, or abdomen.  You have a fever.  You cannot walk up stairs or exercise the way that you normally do. These symptoms may represent a serious problem that is an emergency. Do not wait to see if the symptoms will go away. Get medical help right away. Call your local emergency services (911 in the [place] Do not drive yourself  to the hospital. Summary  Shortness of breath is when a person has trouble breathing enough air. It can be a sign of a medical problem.  Avoid things that irritate your lungs, such as smoking, pollution, mold, and dust.  Pay attention to changes in your symptoms and contact your health care provider if you have a hard time completing daily activities because of shortness of breath. This information is not intended to replace advice given to you by your health care provider. Make sure you discuss any questions you have with your health care provider. Document Revised: 12/05/2017 Document Reviewed: 12/05/2017 Elsevier Patient Education  2020 [employer]  

## 2019-12-21 ENCOUNTER — Other Ambulatory Visit: Payer: Self-pay | Admitting: Family Medicine

## 2019-12-21 DIAGNOSIS — I10 Essential (primary) hypertension: Secondary | ICD-10-CM

## 2020-01-07 ENCOUNTER — Other Ambulatory Visit (HOSPITAL_COMMUNITY): Payer: Self-pay | Admitting: Psychiatry

## 2020-01-07 DIAGNOSIS — F32A Depression, unspecified: Secondary | ICD-10-CM

## 2020-01-07 DIAGNOSIS — F411 Generalized anxiety disorder: Secondary | ICD-10-CM

## 2020-01-14 ENCOUNTER — Other Ambulatory Visit: Payer: Self-pay | Admitting: Family Medicine

## 2020-01-14 MED ORDER — DOXYCYCLINE HYCLATE 100 MG PO TABS: 100.0000 mg | ORAL_TABLET | Freq: Two times a day (BID) | ORAL | 0 refills | Status: DC

## 2020-01-14 NOTE — Telephone Encounter (Signed)
  Prescription Request  01/14/2020  What is the name of the medication or equipment? Doxycycline hyclate/ pt said he has pulled several ticks off  Have you contacted your pharmacy to request a refill? (if applicable) no  Which pharmacy would you like this sent to? [facility]   Patient notified that their request is being sent to the clinical staff for review and that they should receive a response within 2 business days.

## 2020-01-16 ENCOUNTER — Telehealth (INDEPENDENT_AMBULATORY_CARE_PROVIDER_SITE_OTHER): Payer: Medicare Other | Admitting: Psychiatry

## 2020-01-16 ENCOUNTER — Other Ambulatory Visit: Payer: Self-pay

## 2020-01-16 VITALS — BP 130/71 | HR 60 | Ht 63.0 in | Wt 183.0 lb

## 2020-01-16 DIAGNOSIS — F325 Major depressive disorder, single episode, in full remission: Secondary | ICD-10-CM

## 2020-01-16 MED ORDER — LORAZEPAM 0.5 MG PO TABS: 0.5000 mg | ORAL_TABLET | Freq: Every morning | ORAL | 3 refills | Status: DC

## 2020-01-16 MED ORDER — BUSPIRONE HCL 15 MG PO TABS: ORAL_TABLET | ORAL | 5 refills | Status: DC

## 2020-01-16 MED ORDER — MIRTAZAPINE 15 MG PO TABS: ORAL_TABLET | ORAL | 5 refills | Status: DC

## 2020-01-16 NOTE — Progress Notes (Signed)
Psychiatric Initial Adult Assessment   Patient Identification: George Wang MRN:  [id number] Date of Evaluation:  01/16/2020 Referral Source: Community/emergency room Chief Complaint:   Visit Diagnosis: Major depression  History of Present Illness:    Today the patient is seen with his wife [name].  The patient continues to do very well.  He has an isolated day or 2 where he does not feels great.  He does not know what the stressors that makes him feel.  He actually does feel somewhat anxious.  But it always goes away.  The patient's health is fairly good.  He is being treated for hypertension and high cholesterol.  He denies chest pain or shortness of breath or any neurological symptoms.  His anxiety is really well controlled.  He denies daily depression.  He is sleeping and eating well.  Financially he is very stable.  He has 5 grandchildren who are all doing well.  Patient's only concern is he is gaining some weight.  Over the time is been working with me he is gone from 175 to a weight of 182.  I acknowledged with him that unlikely is related to the Remeron.  At this time I am resistant to discontinuing.  He continues taking BuSpar on a regular basis.  Presently is not in psychotherapy.  Financially he is stable and has had no deaths.  Associated Signs/Symptoms: Depression Symptoms:  depressed mood, (Hypo) Manic Symptoms:   Anxiety Symptoms:  Excessive Worry, Psychotic Symptoms:   PTSD Symptoms:   Past Psychiatric History: 1 psychiatric hospitalization in 1989 presently on Lexapro 10 mg, BuSpar 30 mg  Previous Psychotropic Medications: Yes   Substance Abuse History in the last 12 months:  No.  Consequences of Substance Abuse:   Past Medical History:  Past Medical History:  Diagnosis Date  . Allergy    mild  . Anemia    Iron deficiency  . Anxiety state, unspecified   . Aortic atherosclerosis ([place])   . Arthritis   . Calculus of kidney    kidney stones  - normal BMP   .  Cancer [facility])    Prostate, skin cancer x 2   . Cataract    forming both eyes  . Depressive disorder, not elsewhere classified   . Diverticulosis of colon (without mention of hemorrhage)   . GERD (gastroesophageal reflux disease)    on nexium  . Gout, unspecified   . Heart murmur   . Hyperlipidemia   . Hypertension, essential   . Irritable bowel syndrome   . Neuromuscular disorder (HCC)    numbness in knees from back issues   . Personal history of malignant neoplasm of prostate   . PVC (premature ventricular contraction)   . Type II or unspecified type diabetes mellitus without mention of complication, not stated as uncontrolled     Past Surgical History:  Procedure Laterality Date  . COLONOSCOPY    . HERNIA REPAIR     left side  . PROSTATE SURGERY    . skin cancer removed     chest x 2 3-4 yrs ago   . TONSILLECTOMY      Family Psychiatric History:   Family History:  Family History  Problem Relation Age of Onset  . Cancer Maternal Aunt        breast  . Pancreatic cancer Paternal Aunt   . Prostate cancer Paternal Uncle   . Heart disease Maternal Grandmother 69  . Heart disease Maternal Grandfather  fluid / CHF   . Heart disease Paternal Grandfather 16  . Heart attack Paternal Grandfather        passed at 45  . Prostate cancer Father   . Coronary artery disease Father 29       CABG  . Hypertension Mother   . Hyperlipidemia Mother   . Other Daughter        accident with tornado 09-03-96  . Hypertension Son   . Colon cancer Neg Hx   . Colon polyps Neg Hx   . Esophageal cancer Neg Hx   . Rectal cancer Neg Hx   . Stomach cancer Neg Hx     Social History:   Social History   Socioeconomic History  . Marital status: Married    Spouse name: [name]  . Number of children: 2  . Years of education: 47  . Highest education level: Some college, no degree  Occupational History  . Occupation: Production designer, theatre/television/film    Comment: Part time  Tobacco Use  .  Smoking status: Former Smoker    Packs/day: 1.00    Years: 19.00    Pack years: 19.00    Types: Cigarettes    Quit date: 10/13/1978    Years since quitting: 41.2  . Smokeless tobacco: Former Systems developer    Types: Snuff    Quit date: 05/30/2008  Vaping Use  . Vaping Use: Never used  Substance and Sexual Activity  . Alcohol use: Not Currently  . Drug use: No  . Sexual activity: Yes  Other Topics Concern  . Not on file  Social History Narrative   Lives at home with wife.  He has one son and a daughter that died in a tornado in 4. 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 He has one living grandson and one grandson that passed away in 09-03-16 at age 57 from cancer.   Social Determinants of Health   Financial Resource Strain: Low Risk   . Difficulty of Paying Living Expenses: Not hard at all  Food Insecurity: No Food Insecurity  . Worried About Charity fundraiser in the Last Year: Never true  . Ran Out of Food in the Last Year: Never true  Transportation Needs: No Transportation Needs  . Lack of Transportation (Medical): No  . Lack of Transportation (Non-Medical): No  Physical Activity: Inactive  . Days of Exercise per Week: 0 days  . Minutes of Exercise per Session: 0 min  Stress: No Stress Concern Present  . Feeling of Stress : Only a little  Social Connections: Socially Integrated  . Frequency of Communication with Friends and Family: More than three times a week  . Frequency of Social Gatherings with Friends and Family: More than three times a week  . Attends Religious Services: More than 4 times per year  . Active Member of Clubs or Organizations: Yes  . Attends Archivist Meetings: More than 4 times per year  . Marital Status: Married    Additional Social History:   Allergies:  No Known Allergies  Metabolic Disorder Labs: Lab Results  Component Value Date   HGBA1C 6.0 12/05/2019   No results found for: PROLACTIN Lab Results  Component Value Date    CHOL 101 04/06/2019   TRIG 67 04/06/2019   HDL 34 (L) 04/06/2019   CHOLHDL 3.0 04/06/2019   LDLCALC 52 04/06/2019   LDLCALC 47 11/03/2018   Lab Results  Component Value Date   TSH 1.330 12/05/2018  Therapeutic Level Labs: No results found for: LITHIUM No results found for: CBMZ No results found for: VALPROATE  Current Medications: Current Outpatient Medications  Medication Sig Dispense Refill  . acetaminophen (TYLENOL) 325 MG tablet Take 650 mg by mouth every 6 (six) hours as needed for mild pain.    Marland Kitchen amLODipine (NORVASC) 10 MG tablet Take 1 tablet (10 mg total) by mouth daily. 90 tablet 3  . atorvastatin (LIPITOR) 80 MG tablet Take 0.5 tablets (40 mg total) by mouth daily at 6 PM. 45 tablet 3  . beta carotene 25000 UNIT capsule Take 25,000 Units by mouth daily.    . Blood Glucose Calibration (OT ULTRA/FASTTK CNTRL SOLN) SOLN Use with meter as directed Dx E11.9 1 each 1  . blood glucose meter kit and supplies Dispense based on patient and insurance preference. Use up to four times daily as directed. (FOR ICD-10 E10.9, E11.9). 1 each 1  . busPIRone (BUSPAR) 15 MG tablet 1  qam   2  qhs 90 tablet 5  . chlorthalidone (HYGROTON) 25 MG tablet TAKE 1 TABLET DAILY FOR BLOOD PRESSURE (Patient taking differently: Take 25 mg by mouth daily. ) 30 tablet 4  . Cholecalciferol (VITAMIN D) 2000 UNITS CAPS Take 2 capsules by mouth daily.     . diphenhydramine-acetaminophen (TYLENOL PM) 25-500 MG TABS tablet Take 1 tablet by mouth at bedtime as needed (sleep or pain).    Marland Kitchen doxycycline (VIBRA-TABS) 100 MG tablet Take 1 tablet (100 mg total) by mouth 2 (two) times daily. 20 tablet 0  . FeFum-FePo-FA-B Cmp-C-Zn-Mn-Cu (SE-TAN PLUS) 162-115.2-1 MG CAPS Take 1 capsule by mouth 2 (two) times daily. 180 capsule 3  . Fiber POWD Take 1 Scoop by mouth daily as needed (bowel movement).     . fish oil-omega-3 fatty acids 1000 MG capsule Take 1 g by mouth 2 (two) times daily.     Marland Kitchen glimepiride (AMARYL) 4 MG  tablet Take 0.5 tablets (2 mg total) by mouth 2 (two) times daily. (Patient not taking: Reported on 12/11/2019) 180 tablet 3  . glucose blood test strip 1 each by Other route 4 (four) times daily. 100 each 12  . LORazepam (ATIVAN) 0.5 MG tablet Take 1 tablet (0.5 mg total) by mouth in the morning. 30 tablet 3  . losartan (COZAAR) 100 MG tablet TAKE ONE (1) TABLET EACH DAY 90 tablet 1  . metFORMIN (GLUCOPHAGE) 500 MG tablet TAKE 1 TABLET EACH MORNING AND 1 TABLETEACH EVENING. (Patient taking differently: Take 500-1,000 mg by mouth See admin instructions. Take 2 tablets (1,070m) by mouth every morning and 1 tablet (5068m by mouth every evening.) 180 tablet 3  . mirtazapine (REMERON) 15 MG tablet 1  qhs 30 tablet 5  . Multiple Vitamins-Minerals (CENTRUM SILVER PO) Take 1 tablet by mouth daily.     . Marland Kitchenmeprazole (PRILOSEC) 20 MG capsule Take 1 capsule (20 mg total) by mouth 2 (two) times daily before a meal. TAKE ONE (1) CAPSULE EACH DAY (Patient taking differently: Take 20 mg by mouth 2 (two) times daily before a meal. ) 180 capsule 1  . traMADol (ULTRAM) 50 MG tablet Take 1 tablet (50 mg total) by mouth every 6 (six) hours as needed for moderate pain. 20 tablet 0   No current facility-administered medications for this visit.    Musculoskeletal: Strength & Muscle Tone: within normal limits Gait & Station: normal Patient leans: N/A  Psychiatric Specialty Exam: Review of Systems  There were no vitals taken for this visit.There  is no height or weight on file to calculate BMI.  General Appearance: Casual  Eye Contact:  Good  Speech:  Clear and Coherent  Volume:  Normal  Mood:  Depressed  Affect:  Appropriate  Thought Process:  Coherent  Orientation:  Full (Time, Place, and Person)  Thought Content:  WDL  Suicidal Thoughts:  No  Homicidal Thoughts:  No  Memory:  Negative  Judgement:  Good  Insight:  Good  Psychomotor Activity:  Normal  Concentration:    Recall:  Good  Fund of  Knowledge:Fair  Language: Good  Akathisia:  No  Handed:  Right  AIMS (if indicated):  not done  Assets:  Desire for Improvement  ADL's:  Intact  Cognition: WNL  Sleep:  Good   Screenings: GAD-7     Office Visit from 12/05/2019 in [place] Visit from 09/10/2019 in [place]  Total GAD-7 Score 5 12    Mini-Mental     Clinical Support from 05/16/2017 in [place]  Total Score (max 30 points ) 29    PHQ2-9     Office Visit from 12/11/2019 in [place] Office Visit from 12/05/2019 in [place] Office Visit from 09/10/2019 in [place] Visit from 08/06/2019 in [place] from 07/06/2019 in [place]  PHQ-2 Total Score 0 0 6 1 0  PHQ-9 Total Score -- 1 12 -- --      Assessment and Plan:   At this time the first problem this patient has is that of major depression.  I think the Remeron has been very helpful.  At this time we will reduce his dose from 30 mg down to 15 mg and I will be resistant to change it further.  His second problem is an adjustment disorder with an anxious mood state.  He takes 25 mg of Ativan in the morning and BuSpar and does very well.  His anxiety is just about absent.  The patient is functioning extremely well.  He still works with his son.  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  The patient is very stable.  He will return to see me in 3 months.  Jerral Ralph, MD 6/30/20211:38 PM

## 2020-01-30 ENCOUNTER — Other Ambulatory Visit: Payer: Self-pay | Admitting: Family Medicine

## 2020-02-12 ENCOUNTER — Other Ambulatory Visit (HOSPITAL_COMMUNITY): Payer: Self-pay | Admitting: *Deleted

## 2020-02-12 DIAGNOSIS — F411 Generalized anxiety disorder: Secondary | ICD-10-CM

## 2020-02-12 DIAGNOSIS — F32A Depression, unspecified: Secondary | ICD-10-CM

## 2020-02-12 MED ORDER — ESCITALOPRAM OXALATE 10 MG PO TABS: ORAL_TABLET | ORAL | 3 refills | Status: DC

## 2020-02-19 DIAGNOSIS — E119 Type 2 diabetes mellitus without complications: Secondary | ICD-10-CM | POA: Diagnosis not present

## 2020-02-19 DIAGNOSIS — H2513 Age-related nuclear cataract, bilateral: Secondary | ICD-10-CM | POA: Diagnosis not present

## 2020-02-19 LAB — HM DIABETES EYE EXAM

## 2020-03-27 ENCOUNTER — Other Ambulatory Visit: Payer: Self-pay

## 2020-03-27 ENCOUNTER — Encounter: Payer: Self-pay | Admitting: Family Medicine

## 2020-03-27 ENCOUNTER — Ambulatory Visit (INDEPENDENT_AMBULATORY_CARE_PROVIDER_SITE_OTHER): Payer: Medicare Other | Admitting: Family Medicine

## 2020-03-27 VITALS — BP 108/61 | HR 79 | Temp 97.3°F | Ht 63.0 in | Wt 186.0 lb

## 2020-03-27 DIAGNOSIS — E782 Mixed hyperlipidemia: Secondary | ICD-10-CM

## 2020-03-27 DIAGNOSIS — I10 Essential (primary) hypertension: Secondary | ICD-10-CM | POA: Diagnosis not present

## 2020-03-27 DIAGNOSIS — E1169 Type 2 diabetes mellitus with other specified complication: Secondary | ICD-10-CM

## 2020-03-27 LAB — BAYER DCA HB A1C WAIVED: HB A1C (BAYER DCA - WAIVED): 6.2 % (ref <7.0)

## 2020-03-27 NOTE — Progress Notes (Signed)
BP 108/61   Pulse 79   Temp (!) 97.3 F (36.3 C)   Ht 5' 3"  (1.6 m)   Wt 186 lb (84.4 kg)   SpO2 95%   BMI 32.95 kg/m    Subjective:   Patient ID: George Wang, male    DOB: 12/27/1945, 74 y.o.   MRN: [id number]  HPI: George Wang is a 74 y.o. male presenting on 03/27/2020 for Medical Management of Chronic Issues and Diabetes   HPI Type 2 diabetes mellitus Patient comes in today for recheck of his diabetes. Patient has been currently taking glimepiride and Metformin. Patient is currently on an ACE inhibitor/ARB. Patient has not seen an ophthalmologist this year. Patient denies any issues with their feet. The symptom started onset as an adult hypertension and hyperlipidemia ARE RELATED TO DM   Hypertension Patient is currently on losartan and chlorthalidone and amlodipine, and their blood pressure today is 108/61. Patient denies any lightheadedness or dizziness. Patient denies headaches, blurred vision, chest pains, shortness of breath, or weakness. Denies any side effects from medication and is content with current medication.   Hyperlipidemia Patient is coming in for recheck of his hyperlipidemia. The patient is currently taking fish oil and lipitor. They deny any issues with myalgias or history of liver damage from it. They deny any focal numbness or weakness or chest pain.   Relevant past medical, surgical, family and social history reviewed and updated as indicated. Interim medical history since our last visit reviewed. Allergies and medications reviewed and updated.  Review of Systems  Constitutional: Negative for chills and fever.  Eyes: Negative for visual disturbance.  Respiratory: Negative for shortness of breath and wheezing.   Cardiovascular: Negative for chest pain and leg swelling.  Musculoskeletal: Negative for back pain and gait problem.  Skin: Negative for rash.  Neurological: Negative for dizziness, weakness and light-headedness.  All other systems  reviewed and are negative.   Per HPI unless specifically indicated above   Allergies as of 03/27/2020   No Known Allergies     Medication List       Accurate as of March 27, 2020  2:34 PM. If you have any questions, ask your nurse or doctor.        STOP taking these medications   diphenhydramine-acetaminophen 25-500 MG Tabs tablet Commonly known as: TYLENOL PM Stopped by: Fransisca Kaufmann Ardine Iacovelli, MD     TAKE these medications   acetaminophen 325 MG tablet Commonly known as: TYLENOL Take 650 mg by mouth every 6 (six) hours as needed for mild pain.   amLODipine 10 MG tablet Commonly known as: NORVASC Take 1 tablet (10 mg total) by mouth daily.   atorvastatin 80 MG tablet Commonly known as: LIPITOR Take 0.5 tablets (40 mg total) by mouth daily at 6 PM.   beta carotene 25000 UNIT capsule Take 25,000 Units by mouth daily.   blood glucose meter kit and supplies Dispense based on patient and insurance preference. Use up to four times daily as directed. (FOR ICD-10 E10.9, E11.9).   busPIRone 15 MG tablet Commonly known as: BUSPAR 1  qam   2  qhs   CENTRUM SILVER PO Take 1 tablet by mouth daily.   chlorthalidone 25 MG tablet Commonly known as: HYGROTON TAKE 1 TABLET DAILY FOR BLOOD PRESSURE   doxycycline 100 MG tablet Commonly known as: VIBRA-TABS Take 1 tablet (100 mg total) by mouth 2 (two) times daily.   escitalopram 10 MG tablet Commonly known as: Lexapro  Take one tablet by mouth every morning.   Fiber Powd Take 1 Scoop by mouth daily as needed (bowel movement).   fish oil-omega-3 fatty acids 1000 MG capsule Take 1 g by mouth 2 (two) times daily.   glimepiride 4 MG tablet Commonly known as: AMARYL Take 0.5 tablets (2 mg total) by mouth 2 (two) times daily.   glucose blood test strip 1 each by Other route 4 (four) times daily.   LORazepam 0.5 MG tablet Commonly known as: Ativan Take 1 tablet (0.5 mg total) by mouth in the morning.   losartan 100 MG  tablet Commonly known as: COZAAR TAKE ONE (1) TABLET EACH DAY   metFORMIN 500 MG tablet Commonly known as: GLUCOPHAGE TAKE 1 TABLET EACH MORNING AND 1 TABLETEACH EVENING. What changed:   how much to take  how to take this  when to take this  additional instructions   mirtazapine 15 MG tablet Commonly known as: Remeron 1  qhs   omeprazole 20 MG capsule Commonly known as: PRILOSEC Take 1 capsule (20 mg total) by mouth 2 (two) times daily before a meal. TAKE ONE (1) CAPSULE EACH DAY What changed: additional instructions   OT ULTRA/FASTTK CNTRL SOLN Soln Use with meter as directed Dx E11.9   Se-Tan PLUS 162-115.2-1 MG Caps Take 1 capsule by mouth 2 (two) times daily.   traMADol 50 MG tablet Commonly known as: ULTRAM Take 1 tablet (50 mg total) by mouth every 6 (six) hours as needed for moderate pain.   Vitamin D 50 MCG (2000 UT) Caps Take 2 capsules by mouth daily.        Objective:   BP 108/61   Pulse 79   Temp (!) 97.3 F (36.3 C)   Ht 5' 3"  (1.6 m)   Wt 186 lb (84.4 kg)   SpO2 95%   BMI 32.95 kg/m   Wt Readings from Last 3 Encounters:  03/27/20 186 lb (84.4 kg)  12/11/19 178 lb 3.2 oz (80.8 kg)  12/07/19 183 lb 12.8 oz (83.4 kg)    Physical Exam Vitals and nursing note reviewed.  Constitutional:      General: He is not in acute distress.    Appearance: He is well-developed. He is not diaphoretic.  Eyes:     General: No scleral icterus.    Conjunctiva/sclera: Conjunctivae normal.  Neck:     Thyroid: No thyromegaly.  Cardiovascular:     Rate and Rhythm: Normal rate and regular rhythm.     Heart sounds: Normal heart sounds. No murmur heard.   Pulmonary:     Effort: Pulmonary effort is normal. No respiratory distress.     Breath sounds: Normal breath sounds. No wheezing.  Musculoskeletal:        General: Normal range of motion.     Cervical back: Neck supple.  Lymphadenopathy:     Cervical: No cervical adenopathy.  Skin:    General: Skin is  warm and dry.     Findings: No rash.  Neurological:     Mental Status: He is alert and oriented to person, place, and time.     Coordination: Coordination normal.  Psychiatric:        Behavior: Behavior normal.       Assessment & Plan:   Problem List Items Addressed This Visit      Cardiovascular and Mediastinum   Essential hypertension   Relevant Orders   CMP14+EGFR   CBC with Differential/Platelet     Endocrine   Type  2 diabetes mellitus ([place]) - Primary   Relevant Orders   Bayer DCA Hb A1c Waived (Completed)   CMP14+EGFR     Other   Hyperlipemia   Relevant Orders   Lipid panel   Morbid obesity ([place])      A1c 6.2, continue current medication Follow up plan: Return in about 4 months (around 07/27/2020), or if symptoms worsen or fail to improve, for Diabetes and hypertension.  Counseling provided for all of the vaccine components Orders Placed This Encounter  Procedures  . [facility] Hb A1c [place], MD [facility] 03/27/2020, 2:34 PM

## 2020-03-28 LAB — CMP14+EGFR
ALT: 17 IU/L (ref 0–44)
AST: 50 IU/L — ABNORMAL HIGH (ref 0–40)
Albumin/Globulin Ratio: 2 (ref 1.2–2.2)
Albumin: 4.3 g/dL (ref 3.7–4.7)
Alkaline Phosphatase: 82 IU/L (ref 48–121)
BUN/Creatinine Ratio: 17 (ref 10–24)
BUN: 16 mg/dL (ref 8–27)
Bilirubin Total: 0.5 mg/dL (ref 0.0–1.2)
CO2: 30 mmol/L — ABNORMAL HIGH (ref 20–29)
Calcium: 9.5 mg/dL (ref 8.6–10.2)
Chloride: 100 mmol/L (ref 96–106)
Creatinine, Ser: 0.92 mg/dL (ref 0.76–1.27)
GFR calc Af Amer: 93 mL/min/{1.73_m2} (ref >59)
GFR calc non Af Amer: 81 mL/min/{1.73_m2} (ref >59)
Globulin, Total: 2.2 g/dL (ref 1.5–4.5)
Glucose: 125 mg/dL — ABNORMAL HIGH (ref 65–99)
Potassium: 3.4 mmol/L — ABNORMAL LOW (ref 3.5–5.2)
Sodium: 141 mmol/L (ref 134–144)
Total Protein: 6.5 g/dL (ref 6.0–8.5)

## 2020-03-28 LAB — CBC WITH DIFFERENTIAL/PLATELET
Basophils Absolute: 0.1 10*3/uL (ref 0.0–0.2)
Basos: 1 %
EOS (ABSOLUTE): 0.2 10*3/uL (ref 0.0–0.4)
Eos: 3 %
Hematocrit: 37.4 % — ABNORMAL LOW (ref 37.5–51.0)
Hemoglobin: 12.8 g/dL — ABNORMAL LOW (ref 13.0–17.7)
Immature Grans (Abs): 0 10*3/uL (ref 0.0–0.1)
Immature Granulocytes: 0 %
Lymphocytes Absolute: 1.6 10*3/uL (ref 0.7–3.1)
Lymphs: 25 %
MCH: 34.3 pg — ABNORMAL HIGH (ref 26.6–33.0)
MCHC: 34.2 g/dL (ref 31.5–35.7)
MCV: 100 fL — ABNORMAL HIGH (ref 79–97)
Monocytes Absolute: 0.5 10*3/uL (ref 0.1–0.9)
Monocytes: 8 %
Neutrophils Absolute: 4 10*3/uL (ref 1.4–7.0)
Neutrophils: 63 %
Platelets: 251 10*3/uL (ref 150–450)
RBC: 3.73 x10E6/uL — ABNORMAL LOW (ref 4.14–5.80)
RDW: 12.3 % (ref 11.6–15.4)
WBC: 6.2 10*3/uL (ref 3.4–10.8)

## 2020-03-28 LAB — LIPID PANEL
Chol/HDL Ratio: 2.9 ratio (ref 0.0–5.0)
Cholesterol, Total: 107 mg/dL (ref 100–199)
HDL: 37 mg/dL — ABNORMAL LOW (ref >39)
LDL Chol Calc (NIH): 57 mg/dL (ref 0–99)
Triglycerides: 57 mg/dL (ref 0–149)
VLDL Cholesterol Cal: 13 mg/dL (ref 5–40)

## 2020-04-15 ENCOUNTER — Telehealth: Payer: Self-pay

## 2020-04-15 NOTE — Telephone Encounter (Signed)
Reached out to [facility], they were unable to contact the patient, and have not received the test kit back.

## 2020-04-18 ENCOUNTER — Other Ambulatory Visit: Payer: Self-pay

## 2020-04-18 ENCOUNTER — Telehealth (INDEPENDENT_AMBULATORY_CARE_PROVIDER_SITE_OTHER): Payer: Medicare Other | Admitting: Psychiatry

## 2020-04-18 DIAGNOSIS — F339 Major depressive disorder, recurrent, unspecified: Secondary | ICD-10-CM

## 2020-04-18 DIAGNOSIS — F411 Generalized anxiety disorder: Secondary | ICD-10-CM

## 2020-04-18 DIAGNOSIS — F32A Depression, unspecified: Secondary | ICD-10-CM | POA: Diagnosis not present

## 2020-04-18 MED ORDER — BUSPIRONE HCL 15 MG PO TABS
ORAL_TABLET | ORAL | 5 refills | Status: DC
Start: 2020-04-18 — End: 2020-06-20

## 2020-04-18 MED ORDER — ESCITALOPRAM OXALATE 10 MG PO TABS: ORAL_TABLET | ORAL | 3 refills | Status: DC

## 2020-04-18 MED ORDER — LORAZEPAM 0.5 MG PO TABS
0.5000 mg | ORAL_TABLET | Freq: Every morning | ORAL | 3 refills | Status: DC
Start: 2020-04-18 — End: 2020-05-13

## 2020-04-18 MED ORDER — MIRTAZAPINE 30 MG PO TABS
ORAL_TABLET | ORAL | 3 refills | Status: DC
Start: 2020-04-18 — End: 2020-07-29

## 2020-04-18 NOTE — Progress Notes (Signed)
Psychiatric Initial Adult Assessment   Patient Identification: George Wang MRN:  [id number] Date of Evaluation:  04/18/2020 Referral Source: Community/emergency room Chief Complaint:   Visit Diagnosis: Major depression  History of Present Illness:    Today the patient is only doing fairly well.  He is having more short.  She is being depressed.  This occurred and we reduced his Remeron from 30-15.  We do that because of issues of weight gain.  His weight really has not changed since reduced.  He has a number of issues.  First of all for his license he should not be taking any Ativan.  He takes a very small dose and it does not sedate him and has been taking it for now while.  The patient is anxiety level is mild and relatively well-controlled also taking BuSpar 15 mg twice daily.  Today seen with his wife [name].  Patient points out that if he is doing something and is active he does not feel depressed at all.  He wakes up in the morning that when he does feel somewhat depressed but once he gets going it seems to go away.  Still overall his mood is not as good as it was when he was taking 30 mg.  He drinks no alcohol use no drugs and is not psychotic.  He never really started into any type of therapy. Associated Signs/Symptoms: Depression Symptoms:  depressed mood, (Hypo) Manic Symptoms:   Anxiety Symptoms:  Excessive Worry, Psychotic Symptoms:   PTSD Symptoms:   Past Psychiatric History: 1 psychiatric hospitalization in 1989 presently on Lexapro 10 mg, BuSpar 30 mg  Previous Psychotropic Medications: Yes   Substance Abuse History in the last 12 months:  No.  Consequences of Substance Abuse:   Past Medical History:  Past Medical History:  Diagnosis Date  . Allergy    mild  . Anemia    Iron deficiency  . Anxiety state, unspecified   . Aortic atherosclerosis ([place])   . Arthritis   . Calculus of kidney    kidney stones  - normal BMP   . Cancer [facility])    Prostate, skin cancer x  2   . Cataract    forming both eyes  . Depressive disorder, not elsewhere classified   . Diverticulosis of colon (without mention of hemorrhage)   . GERD (gastroesophageal reflux disease)    on nexium  . Gout, unspecified   . Heart murmur   . Hyperlipidemia   . Hypertension, essential   . Irritable bowel syndrome   . Neuromuscular disorder (H[facility])    numbness in knees from back issues   . Personal history of malignant neoplasm of prostate   . PVC (premature ventricular contraction)   . Type II or unspecified type diabetes mellitus without mention of complication, not stated as uncontrolled     Past Surgical History:  Procedure Laterality Date  . COLONOSCOPY    . HERNIA REPAIR     left side  . PROSTATE SURGERY    . skin cancer removed     chest x 2 3-4 yrs ago   . TONSILLECTOMY      Family Psychiatric History:   Family History:  Family History  Problem Relation Age of Onset  . Cancer Maternal Aunt        breast  . Pancreatic cancer Paternal Aunt   . Prostate cancer Paternal Uncle   . Heart disease Maternal Grandmother 69  . Heart disease Maternal Grandfather  fluid / CHF   . Heart disease Paternal Grandfather 5  . Heart attack Paternal Grandfather        passed at 76  . Prostate cancer Father   . Coronary artery disease Father 29       CABG  . Hypertension Mother   . Hyperlipidemia Mother   . Other Daughter        accident with tornado 09/15/1996  . Hypertension Son   . Colon cancer Neg Hx   . Colon polyps Neg Hx   . Esophageal cancer Neg Hx   . Rectal cancer Neg Hx   . Stomach cancer Neg Hx     Social History:   Social History   Socioeconomic History  . Marital status: Married    Spouse name: [name]  . Number of children: 2  . Years of education: 51  . Highest education level: Some college, no degree  Occupational History  . Occupation: Production designer, theatre/television/film    Comment: Part time  Tobacco Use  . Smoking status: Former Smoker    Packs/day:  1.00    Years: 19.00    Pack years: 19.00    Types: Cigarettes    Quit date: 10/13/1978    Years since quitting: 41.5  . Smokeless tobacco: Former Systems developer    Types: Snuff    Quit date: 05/30/2008  Vaping Use  . Vaping Use: Never used  Substance and Sexual Activity  . Alcohol use: Not Currently  . Drug use: No  . Sexual activity: Yes  Other Topics Concern  . Not on file  Social History Narrative   Lives at home with wife.  He has one son and a daughter that died in a tornado in 33. 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 He has one living grandson and one grandson that passed away in 09-15-2016 at age 38 from cancer.   Social Determinants of Health   Financial Resource Strain: Low Risk   . Difficulty of Paying Living Expenses: Not hard at all  Food Insecurity: No Food Insecurity  . Worried About Charity fundraiser in the Last Year: Never true  . Ran Out of Food in the Last Year: Never true  Transportation Needs: No Transportation Needs  . Lack of Transportation (Medical): No  . Lack of Transportation (Non-Medical): No  Physical Activity: Inactive  . Days of Exercise per Week: 0 days  . Minutes of Exercise per Session: 0 min  Stress: No Stress Concern Present  . Feeling of Stress : Only a little  Social Connections: Socially Integrated  . Frequency of Communication with Friends and Family: More than three times a week  . Frequency of Social Gatherings with Friends and Family: More than three times a week  . Attends Religious Services: More than 4 times per year  . Active Member of Clubs or Organizations: Yes  . Attends Archivist Meetings: More than 4 times per year  . Marital Status: Married    Additional Social History:   Allergies:  No Known Allergies  Metabolic Disorder Labs: Lab Results  Component Value Date   HGBA1C 6.2 03/27/2020   No results found for: PROLACTIN Lab Results  Component Value Date   CHOL 107 03/27/2020   TRIG 57 03/27/2020    HDL 37 (L) 03/27/2020   CHOLHDL 2.9 03/27/2020   LDLCALC 57 03/27/2020   LDLCALC 52 04/06/2019   Lab Results  Component Value Date   TSH 1.330 12/05/2018  Therapeutic Level Labs: No results found for: LITHIUM No results found for: CBMZ No results found for: VALPROATE  Current Medications: Current Outpatient Medications  Medication Sig Dispense Refill  . acetaminophen (TYLENOL) 325 MG tablet Take 650 mg by mouth every 6 (six) hours as needed for mild pain.    Marland Kitchen amLODipine (NORVASC) 10 MG tablet Take 1 tablet (10 mg total) by mouth daily. 90 tablet 3  . atorvastatin (LIPITOR) 80 MG tablet Take 0.5 tablets (40 mg total) by mouth daily at 6 PM. 45 tablet 3  . beta carotene 25000 UNIT capsule Take 25,000 Units by mouth daily.    . Blood Glucose Calibration (OT ULTRA/FASTTK CNTRL SOLN) SOLN Use with meter as directed Dx E11.9 1 each 1  . blood glucose meter kit and supplies Dispense based on patient and insurance preference. Use up to four times daily as directed. (FOR ICD-10 E10.9, E11.9). 1 each 1  . busPIRone (BUSPAR) 15 MG tablet 1  qam   2  qhs 90 tablet 5  . chlorthalidone (HYGROTON) 25 MG tablet TAKE 1 TABLET DAILY FOR BLOOD PRESSURE 30 tablet 4  . Cholecalciferol (VITAMIN D) 2000 UNITS CAPS Take 2 capsules by mouth daily.     Marland Kitchen doxycycline (VIBRA-TABS) 100 MG tablet Take 1 tablet (100 mg total) by mouth 2 (two) times daily. 20 tablet 0  . escitalopram (LEXAPRO) 10 MG tablet Take one tablet by mouth every morning. 30 tablet 3  . FeFum-FePo-FA-B Cmp-C-Zn-Mn-Cu (SE-TAN PLUS) 162-115.2-1 MG CAPS Take 1 capsule by mouth 2 (two) times daily. 180 capsule 3  . Fiber POWD Take 1 Scoop by mouth daily as needed (bowel movement).     . fish oil-omega-3 fatty acids 1000 MG capsule Take 1 g by mouth 2 (two) times daily.     Marland Kitchen glimepiride (AMARYL) 4 MG tablet Take 0.5 tablets (2 mg total) by mouth 2 (two) times daily. 180 tablet 3  . glucose blood test strip 1 each by Other route 4 (four)  times daily. 100 each 12  . LORazepam (ATIVAN) 0.5 MG tablet Take 1 tablet (0.5 mg total) by mouth in the morning. 30 tablet 3  . losartan (COZAAR) 100 MG tablet TAKE ONE (1) TABLET EACH DAY 90 tablet 1  . metFORMIN (GLUCOPHAGE) 500 MG tablet TAKE 1 TABLET EACH MORNING AND 1 TABLETEACH EVENING. (Patient taking differently: Take 500-1,000 mg by mouth See admin instructions. Take 2 tablets (1,073m) by mouth every morning and 1 tablet (5065m by mouth every evening.) 180 tablet 3  . mirtazapine (REMERON) 30 MG tablet 1  qhs 30 tablet 3  . Multiple Vitamins-Minerals (CENTRUM SILVER PO) Take 1 tablet by mouth daily.     . Marland Kitchenmeprazole (PRILOSEC) 20 MG capsule Take 1 capsule (20 mg total) by mouth 2 (two) times daily before a meal. TAKE ONE (1) CAPSULE EACH DAY (Patient taking differently: Take 20 mg by mouth 2 (two) times daily before a meal. ) 180 capsule 1  . traMADol (ULTRAM) 50 MG tablet Take 1 tablet (50 mg total) by mouth every 6 (six) hours as needed for moderate pain. 20 tablet 0   No current facility-administered medications for this visit.    Musculoskeletal: Strength & Muscle Tone: within normal limits Gait & Station: normal Patient leans: N/A  Psychiatric Specialty Exam: Review of Systems  There were no vitals taken for this visit.There is no height or weight on file to calculate BMI.  General Appearance: Casual  Eye Contact:  Good  Speech:  Clear and Coherent  Volume:  Normal  Mood:  Depressed  Affect:  Appropriate  Thought Process:  Coherent  Orientation:  Full (Time, Place, and Person)  Thought Content:  WDL  Suicidal Thoughts:  No  Homicidal Thoughts:  No  Memory:  Negative  Judgement:  Good  Insight:  Good  Psychomotor Activity:  Normal  Concentration:    Recall:  Good  Fund of Knowledge:Fair  Language: Good  Akathisia:  No  Handed:  Right  AIMS (if indicated):  not done  Assets:  Desire for Improvement  ADL's:  Intact  Cognition: WNL  Sleep:  Good    Screenings: GAD-7     Office Visit from 03/27/2020 in [place] Office Visit from 12/05/2019 in [facility] Visit from 09/10/2019 in [place]  Total GAD-7 Score _0 Mini-Mental     Clinical Support from 05/16/2017 in [place]  Total Score (max 30 points ) 29    PHQ2-9     Office Visit from 03/27/2020 in [place] Office Visit from 12/11/2019 in [place] Visit from 12/05/2019 in [place] Office Visit from 09/10/2019 in [place] Visit from 08/06/2019 in [place]  PHQ-2 Total Score 2 0 0 6 1  PHQ-9 Total Score 4 -- 1 12 --      Assessment and Plan:   At this time the patient will go back to taking Remeron 30 mg together with 10 mg of Lexapro.  This is for major depression.  He has had a mild decline and hopefully get back to the full dose of Remeron together with the Lexapro will be helpful.  The second problem is an adjustment disorder with an anxious mood state.  We will now increase his BuSpar back to taking 1 in the morning and 2 at night and will continue his Ativan as prescribed.  This patient will be seen again in 2 months. Jerral Ralph, MD 10/1/202111:29 AM

## 2020-05-06 ENCOUNTER — Ambulatory Visit: Payer: Medicare Other

## 2020-05-06 ENCOUNTER — Other Ambulatory Visit: Payer: Self-pay

## 2020-05-06 DIAGNOSIS — I10 Essential (primary) hypertension: Secondary | ICD-10-CM | POA: Diagnosis not present

## 2020-05-06 DIAGNOSIS — Z23 Encounter for immunization: Secondary | ICD-10-CM

## 2020-05-06 DIAGNOSIS — E1169 Type 2 diabetes mellitus with other specified complication: Secondary | ICD-10-CM | POA: Diagnosis not present

## 2020-05-06 DIAGNOSIS — E782 Mixed hyperlipidemia: Secondary | ICD-10-CM | POA: Diagnosis not present

## 2020-05-06 DIAGNOSIS — R002 Palpitations: Secondary | ICD-10-CM | POA: Diagnosis not present

## 2020-05-13 ENCOUNTER — Other Ambulatory Visit (HOSPITAL_COMMUNITY): Payer: Self-pay | Admitting: *Deleted

## 2020-05-13 MED ORDER — LORAZEPAM 0.5 MG PO TABS
ORAL_TABLET | ORAL | 2 refills | Status: DC
Start: 2020-05-13 — End: 2020-06-20

## 2020-05-13 MED ORDER — SERTRALINE HCL 50 MG PO TABS: 50.0000 mg | ORAL_TABLET | Freq: Every day | ORAL | 5 refills | Status: DC

## 2020-05-19 ENCOUNTER — Other Ambulatory Visit (HOSPITAL_COMMUNITY): Payer: Self-pay | Admitting: Psychiatry

## 2020-05-22 ENCOUNTER — Other Ambulatory Visit (HOSPITAL_COMMUNITY): Payer: Self-pay | Admitting: *Deleted

## 2020-05-22 MED ORDER — MIRTAZAPINE 15 MG PO TBDP: ORAL_TABLET | ORAL | 2 refills | Status: DC

## 2020-05-22 MED ORDER — TEMAZEPAM 15 MG PO CAPS: 15.0000 mg | ORAL_CAPSULE | Freq: Every evening | ORAL | 2 refills | Status: DC | PRN

## 2020-05-26 ENCOUNTER — Telehealth (HOSPITAL_COMMUNITY): Payer: Self-pay | Admitting: *Deleted

## 2020-05-26 NOTE — Telephone Encounter (Signed)
Pt wife, [name], called c/o that pt is "not any better" and is "tormented". Pt wife did say that he is asking for help and acknowledges that he has made statements about "not waking up". Wife advised to take pt to [facility] if pt is expressing SI. [name] states that she doesn't think he would do anything. Nurse provided phone number and address for [place]. Pt wife took information but prefers to wait for Dr. To return call.

## 2020-05-28 ENCOUNTER — Telehealth (HOSPITAL_COMMUNITY): Payer: Self-pay | Admitting: *Deleted

## 2020-05-28 NOTE — Telephone Encounter (Signed)
Pt wife, [name], called again today stating that pt is still not doing well, "is not getting any better",and "needs help". Please review and advise.

## 2020-05-30 IMAGING — CT CT CERVICAL SPINE W/O CM
4 of 7 series · 13 of 33 positions shown, 14 images · non-contrast
Comparison: MRI head 07/13/2012

CLINICAL DATA: Fall, head injury

EXAM:
CT HEAD WITHOUT CONTRAST
CT CERVICAL SPINE WITHOUT CONTRAST
TECHNIQUE: Multidetector CT imaging of the head and cervical spine was
performed following the standard protocol without intravenous
contrast. Multiplanar CT image reconstructions of the cervical spine
were also generated.

[Series 8: c spine soft · axial · 0.31mm/px · z∈[-168,-60]mm · 4 of 92 slices shown]
[im 19/92  soft-tissue]
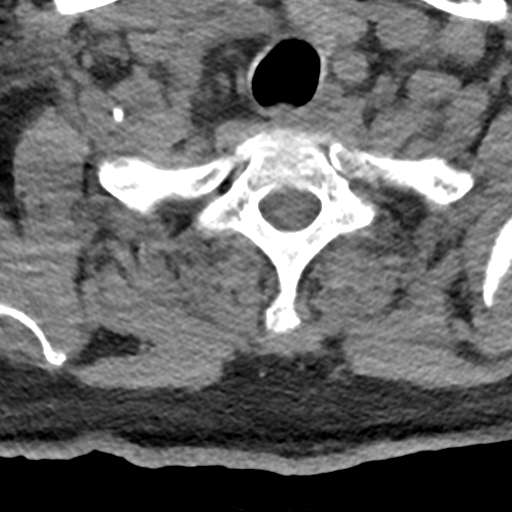
[im 37/92  soft-tissue]
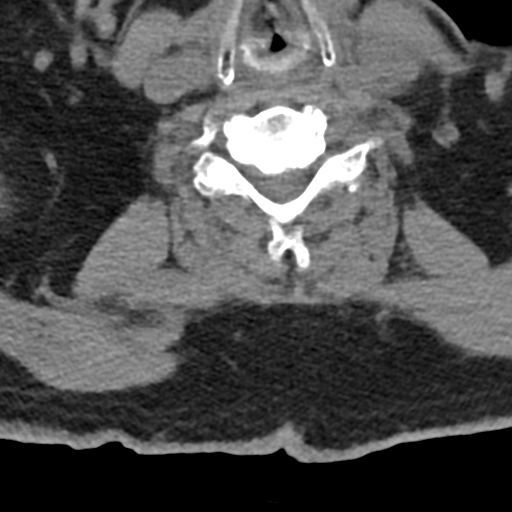
[im 55/92  soft-tissue]
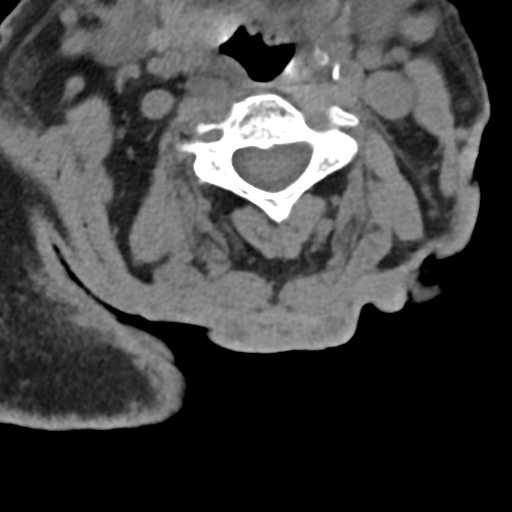
[im 73/92  soft-tissue]
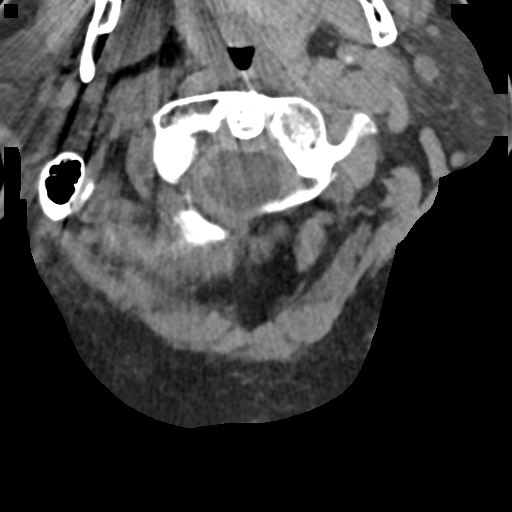

[Series 9: sagittal bone · sagittal · 0.27mm/px · 4 of 61 slices shown]
[im 13/61  bone]
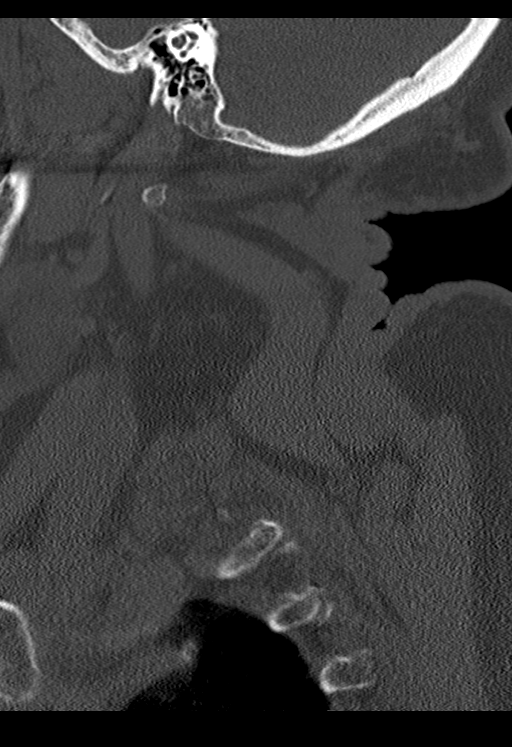
[im 25/61  bone]
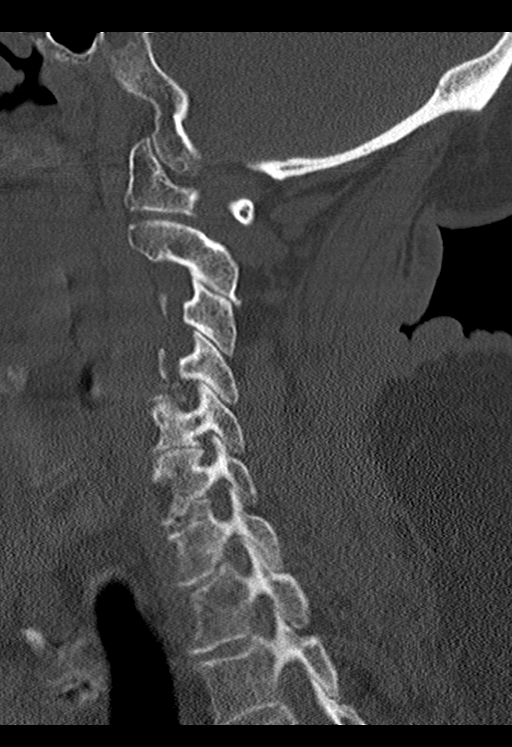
[im 37/61  bone]
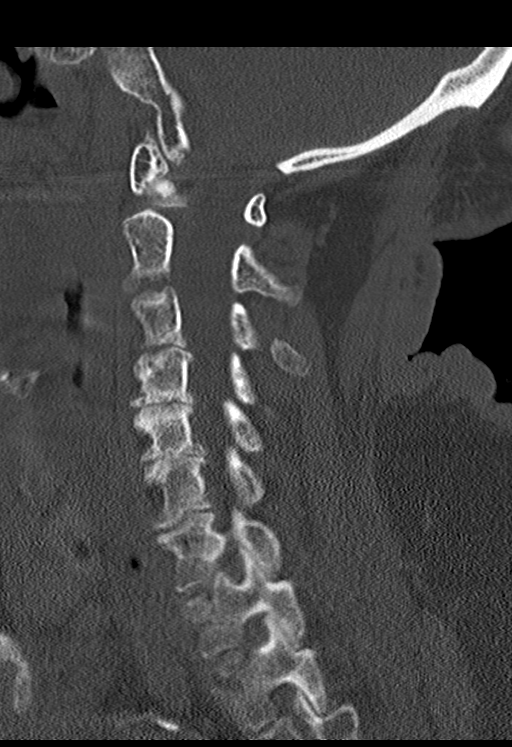
[im 49/61  bone]
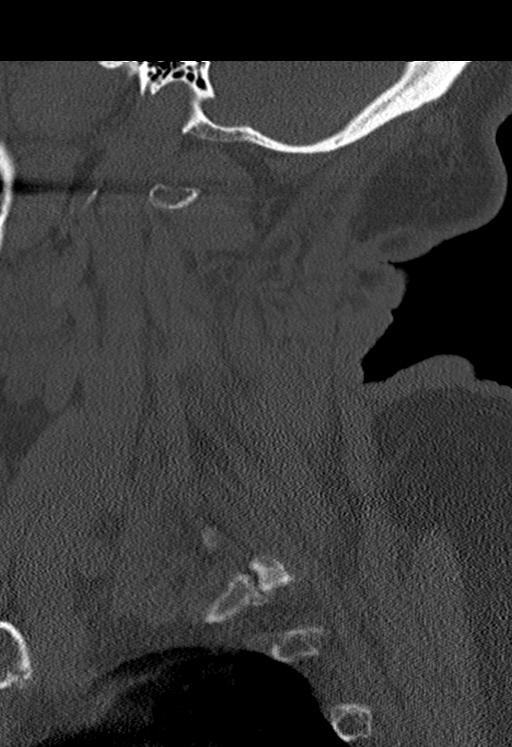

[Series 10: coronal bone · coronal · 0.27mm/px · 1 of 61 slices shown]
[im 31/61  bone]
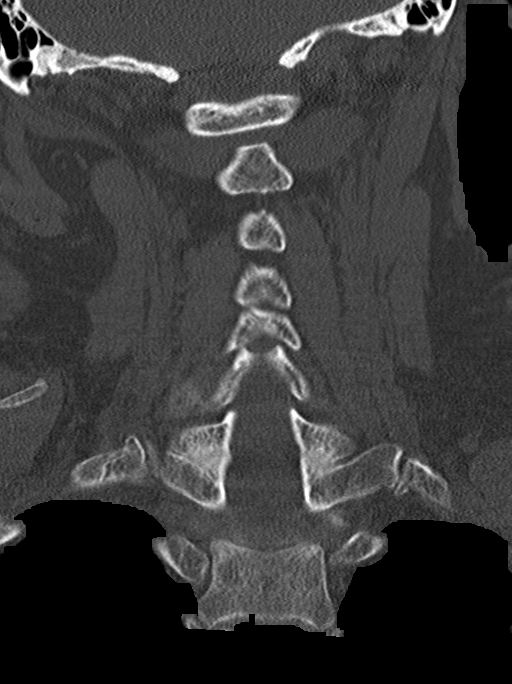

[Series 11: orthogonal bone · axial · 0.21mm/px · z∈[-186,-85]mm · 4 of 89 slices shown, 5 images]
[im 18/89  soft-tissue]
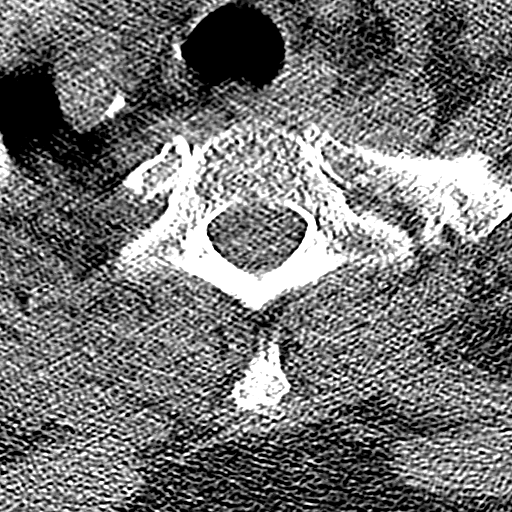
[im 18/89  bone]
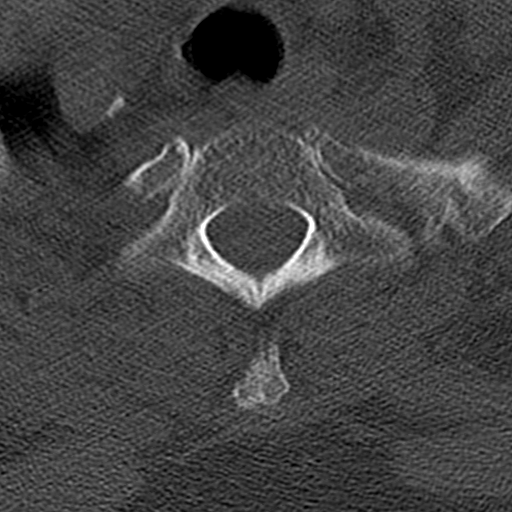
[im 36/89  bone]
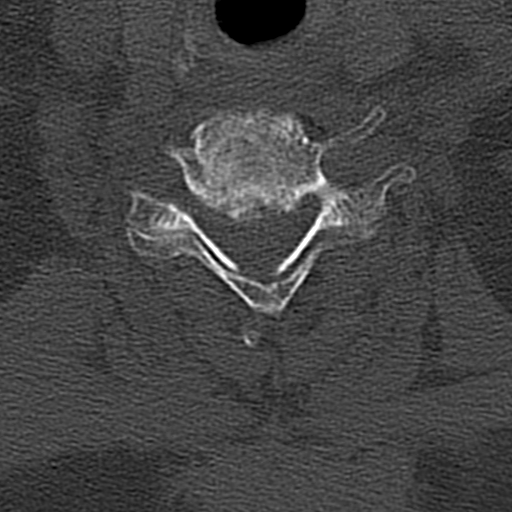
[im 53/89  bone]
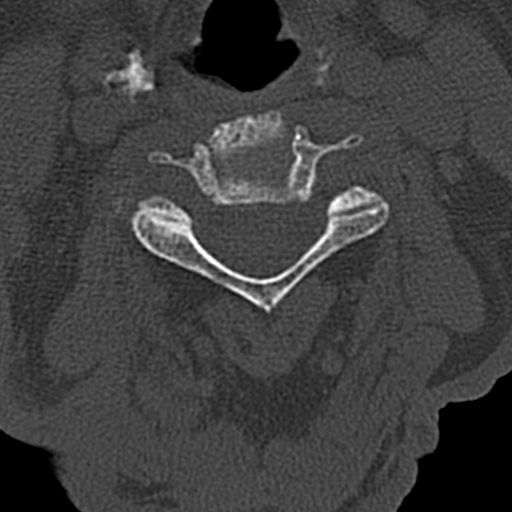
[im 71/89  bone]
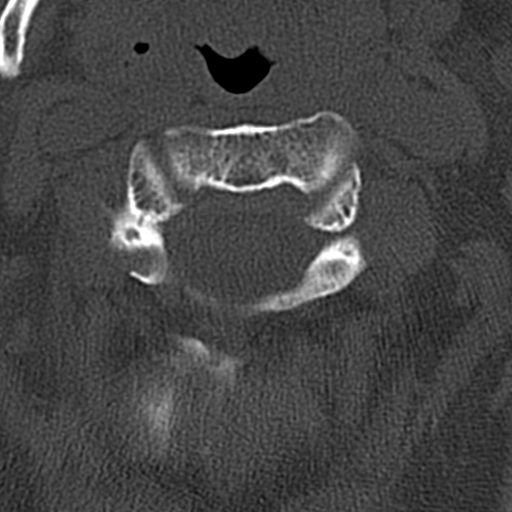

[13 of 33 positions shown; findings below may reference images not displayed]

FINDINGS: CT HEAD FINDINGS

Brain: Mild atrophy. Mild chronic white matter changes. Negative for
acute infarct, hemorrhage, mass

Vascular: Negative for hyperdense vessel

Skull: Negative for skull fracture

Sinuses/Orbits: Negative

Other: None

CT CERVICAL SPINE FINDINGS

Alignment: Normal

Skull base and vertebrae: Negative for fracture

Soft tissues and spinal canal: Negative

Disc levels: Disc degeneration and spondylosis most prominent at
C4-5, C5-6, C6-7

Upper chest: Negative

Other: None
IMPRESSION: 1. No acute intracranial abnormality
2. Cervical spondylosis without fracture.

## 2020-05-30 IMAGING — DX DG CLAVICLE*L*
2 series · 2 of 2 positions shown · non-contrast
Comparison: None.

CLINICAL DATA: Left clavicle injury due to a fall out of bed this
morning. Initial encounter.

EXAM:
LEFT CLAVICLE - 2+ VIEWS

[clavicle ap]
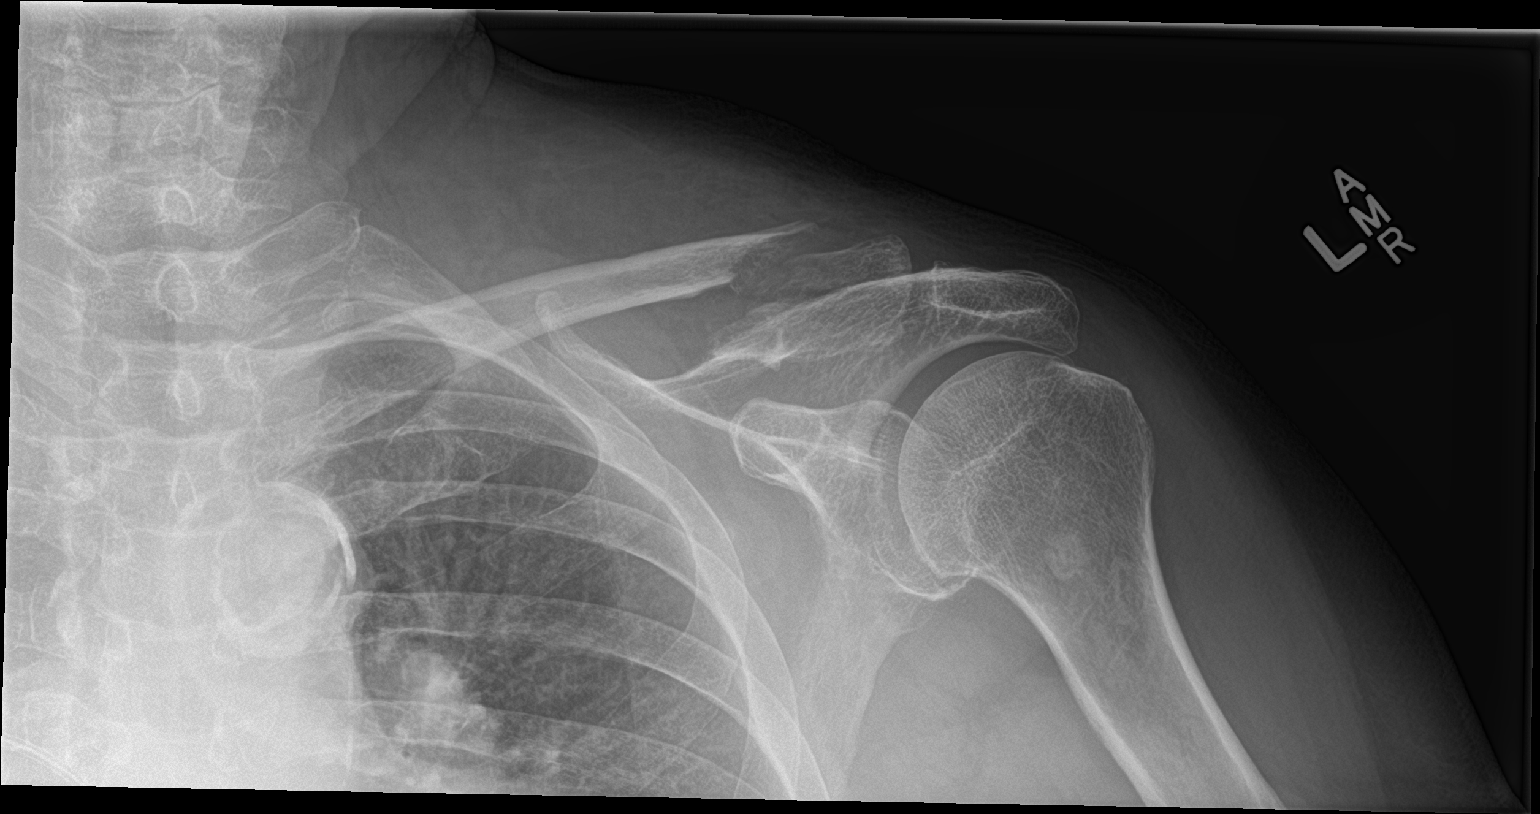

[clavicle axial]
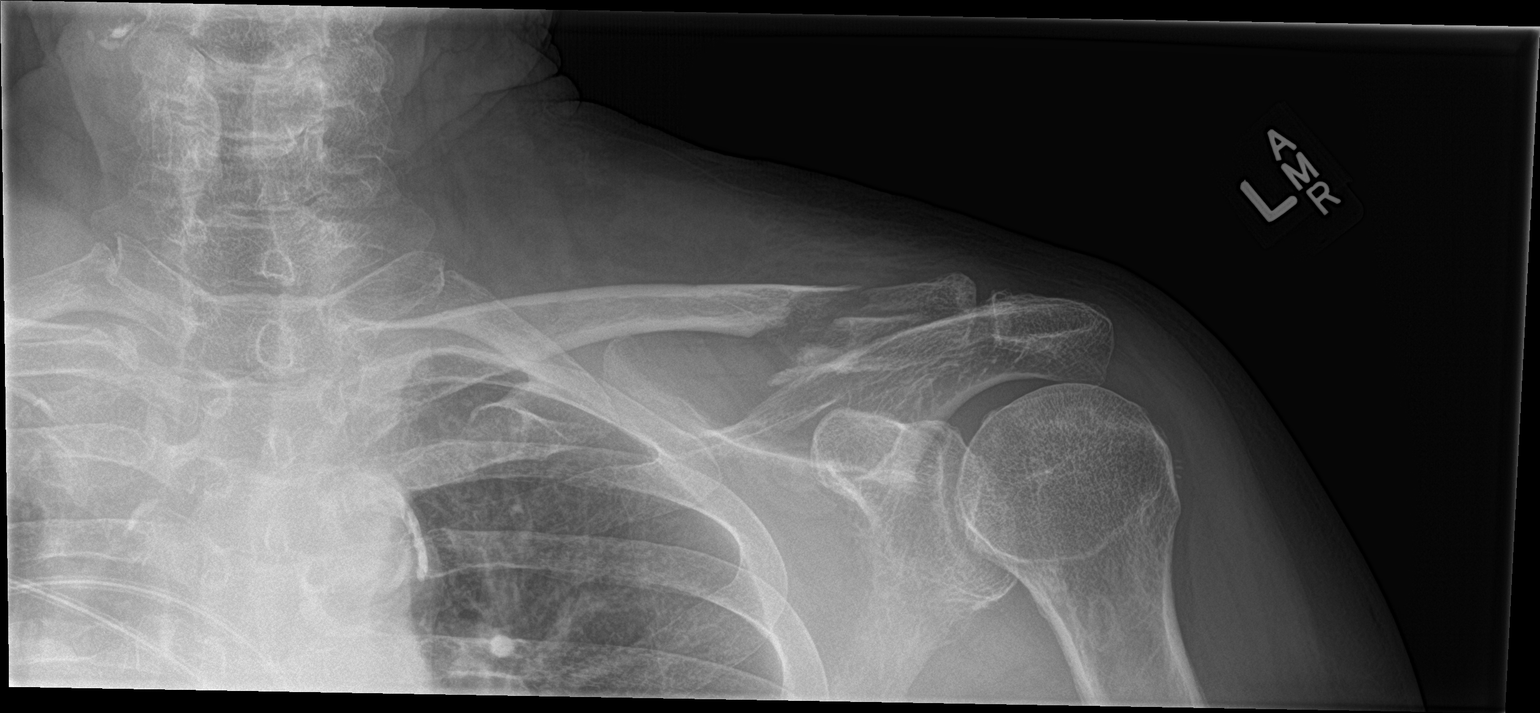

[2 of 2 positions shown; findings below may reference images not displayed]

FINDINGS: The patient has a fracture of the distal diaphysis of the left
clavicle. Fracture fragments are displaced approximately 1.2 cm and
there is approximately [DATE] shaft width inferior displacement of the
distal fragment. The acromioclavicular joint is intact and the
glenohumeral joint is located. No other fracture is identified.
Aortic atherosclerosis is noted.
IMPRESSION: Acute fracture of the distal left clavicle as described above.

## 2020-06-16 ENCOUNTER — Other Ambulatory Visit: Payer: Self-pay | Admitting: Family Medicine

## 2020-06-16 DIAGNOSIS — I10 Essential (primary) hypertension: Secondary | ICD-10-CM

## 2020-06-20 ENCOUNTER — Ambulatory Visit (INDEPENDENT_AMBULATORY_CARE_PROVIDER_SITE_OTHER): Payer: Medicare Other | Admitting: Psychiatry

## 2020-06-20 ENCOUNTER — Other Ambulatory Visit: Payer: Self-pay

## 2020-06-20 DIAGNOSIS — F329 Major depressive disorder, single episode, unspecified: Secondary | ICD-10-CM

## 2020-06-20 MED ORDER — BUSPIRONE HCL 15 MG PO TABS
ORAL_TABLET | ORAL | 5 refills | Status: DC
Start: 2020-06-20 — End: 2020-07-29

## 2020-06-20 MED ORDER — TEMAZEPAM 15 MG PO CAPS
15.0000 mg | ORAL_CAPSULE | Freq: Every evening | ORAL | 2 refills | Status: DC | PRN
Start: 2020-06-20 — End: 2020-06-20

## 2020-06-20 MED ORDER — VENLAFAXINE HCL ER 37.5 MG PO CP24: ORAL_CAPSULE | ORAL | 5 refills | Status: DC

## 2020-06-20 MED ORDER — TEMAZEPAM 15 MG PO CAPS
15.0000 mg | ORAL_CAPSULE | Freq: Every evening | ORAL | 2 refills | Status: DC | PRN
Start: 2020-06-20 — End: 2020-07-29

## 2020-06-20 MED ORDER — LORAZEPAM 0.5 MG PO TABS: ORAL_TABLET | ORAL | 2 refills | Status: DC

## 2020-06-20 NOTE — Progress Notes (Signed)
Psychiatric Initial Adult Assessment   Patient Identification: George Wang MRN:  [id number] Date of Evaluation:  06/20/2020 Referral Source: Community/emergency room Chief Complaint:   Visit Diagnosis: Major depression  History of Present Illness:     Today the patient is not doing well.  He has been feeling persistently depressed for the last 1 month.  He is not suicidal.  But in many ways he does not want to live like this.  He is seen with his wife [name].  Over the last month or so we have made a number of adjustments by the phone.  We began on Effexor 37.5 mg approximately 3 weeks ago.  We adjusted his Ativan to taking 0.5 mg 2 in the morning 1 midday and 1 at night.  His sleep is still very much broken up.  He has a great difficulty falling to sleep wakes up during the night gets up and feels sleepy.  He then has been taking Ativan 0.5 mg 2 in the morning and then he goes to sleep for a few hours.  So his sleep is disrupted.  His appetite is fair.  His energy is low.  Occasionally will have a good day when he will be full of activity but this does not happen often.  Patient says that when he tries to lie down during the day he is thinks of multiple things.  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  His wife [name] is trying to do everything she can.  She says she is never seen him so depressed as he is now.  He is not having problems thinking or concentrating and again is not suicidal.  He drinks no alcohol and he shows no symptoms of psychosis. Associated Signs/Symptoms: Depression Symptoms:  depressed mood, (Hypo) Manic Symptoms:   Anxiety Symptoms:  Excessive Worry, Psychotic Symptoms:   PTSD Symptoms:   Past Psychiatric History: 1 psychiatric hospitalization in 1989 presently on Lexapro 10 mg, BuSpar 30 mg  Previous Psychotropic Medications: Yes   Substance  Abuse History in the last 12 months:  No.  Consequences of Substance Abuse:   Past Medical History:  Past Medical History:  Diagnosis Date  . Allergy    mild  . Anemia    Iron deficiency  . Anxiety state, unspecified   . Aortic atherosclerosis ([place])   . Arthritis   . Calculus of kidney    kidney stones  - normal BMP   . Cancer [facility])    Prostate, skin cancer x 2   . Cataract    forming both eyes  . Depressive disorder, not elsewhere classified   . Diverticulosis of colon (without mention of hemorrhage)   . GERD (gastroesophageal reflux disease)    on nexium  . Gout, unspecified   . Heart murmur   . Hyperlipidemia   . Hypertension, essential   . Irritable bowel syndrome   . Neuromuscular disorder (HCC)    numbness in knees from back issues   . Personal history of malignant neoplasm of prostate   . PVC (premature ventricular contraction)   . Type II or unspecified type diabetes mellitus without mention of complication, not stated as uncontrolled     Past Surgical History:  Procedure Laterality Date  . COLONOSCOPY    . HERNIA REPAIR     left side  .  PROSTATE SURGERY    . skin cancer removed     chest x 2 3-4 yrs ago   . TONSILLECTOMY      Family Psychiatric History:   Family History:  Family History  Problem Relation Age of Onset  . Cancer Maternal Aunt        breast  . Pancreatic cancer Paternal Aunt   . Prostate cancer Paternal Uncle   . Heart disease Maternal Grandmother 69  . Heart disease Maternal Grandfather        fluid / CHF   . Heart disease Paternal Grandfather 82  . Heart attack Paternal Grandfather        passed at 40  . Prostate cancer Father   . Coronary artery disease Father 68       CABG  . Hypertension Mother   . Hyperlipidemia Mother   . Other Daughter        accident with tornado 1996/10/03  . Hypertension Son   . Colon cancer Neg Hx   . Colon polyps Neg Hx   . Esophageal cancer Neg Hx   . Rectal cancer Neg Hx   . Stomach cancer Neg  Hx     Social History:   Social History   Socioeconomic History  . Marital status: Married    Spouse name: [name]  . Number of children: 2  . Years of education: 9  . Highest education level: Some college, no degree  Occupational History  . Occupation: Production designer, theatre/television/film    Comment: Part time  Tobacco Use  . Smoking status: Former Smoker    Packs/day: 1.00    Years: 19.00    Pack years: 19.00    Types: Cigarettes    Quit date: 10/13/1978    Years since quitting: 41.7  . Smokeless tobacco: Former Systems developer    Types: Snuff    Quit date: 05/30/2008  Vaping Use  . Vaping Use: Never used  Substance and Sexual Activity  . Alcohol use: Not Currently  . Drug use: No  . Sexual activity: Yes  Other Topics Concern  . Not on file  Social History Narrative   Lives at home with wife.  He has one son and a daughter that died in a tornado in 54. 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 He has one living grandson and one grandson that passed away in 10/03/2016 at age 9 from cancer.   Social Determinants of Health   Financial Resource Strain: Low Risk   . Difficulty of Paying Living Expenses: Not hard at all  Food Insecurity: No Food Insecurity  . Worried About Charity fundraiser in the Last Year: Never true  . Ran Out of Food in the Last Year: Never true  Transportation Needs: No Transportation Needs  . Lack of Transportation (Medical): No  . Lack of Transportation (Non-Medical): No  Physical Activity: Inactive  . Days of Exercise per Week: 0 days  . Minutes of Exercise per Session: 0 min  Stress: No Stress Concern Present  . Feeling of Stress : Only a little  Social Connections: Socially Integrated  . Frequency of Communication with Friends and Family: More than three times a week  . Frequency of Social Gatherings with Friends and Family: More than three times a week  . Attends Religious Services: More than 4 times per year  . Active Member of Clubs or  Organizations: Yes  . Attends Archivist Meetings: More than 4 times per year  .  Marital Status: Married    Additional Social History:   Allergies:  No Known Allergies  Metabolic Disorder Labs: Lab Results  Component Value Date   HGBA1C 6.2 03/27/2020   No results found for: PROLACTIN Lab Results  Component Value Date   CHOL 107 03/27/2020   TRIG 57 03/27/2020   HDL 37 (L) 03/27/2020   CHOLHDL 2.9 03/27/2020   LDLCALC 57 03/27/2020   LDLCALC 52 04/06/2019   Lab Results  Component Value Date   TSH 1.330 12/05/2018    Therapeutic Level Labs: No results found for: LITHIUM No results found for: CBMZ No results found for: VALPROATE  Current Medications: Current Outpatient Medications  Medication Sig Dispense Refill  . acetaminophen (TYLENOL) 325 MG tablet Take 650 mg by mouth every 6 (six) hours as needed for mild pain.    Marland Kitchen amLODipine (NORVASC) 10 MG tablet Take 1 tablet (10 mg total) by mouth daily. 90 tablet 3  . atorvastatin (LIPITOR) 80 MG tablet Take 0.5 tablets (40 mg total) by mouth daily at 6 PM. 45 tablet 3  . beta carotene 25000 UNIT capsule Take 25,000 Units by mouth daily.    . Blood Glucose Calibration (OT ULTRA/FASTTK CNTRL SOLN) SOLN Use with meter as directed Dx E11.9 1 each 1  . blood glucose meter kit and supplies Dispense based on patient and insurance preference. Use up to four times daily as directed. (FOR ICD-10 E10.9, E11.9). 1 each 1  . busPIRone (BUSPAR) 15 MG tablet 1  qam   2  qhs 90 tablet 5  . chlorthalidone (HYGROTON) 25 MG tablet TAKE 1 TABLET DAILY FOR BLOOD PRESSURE 30 tablet 4  . Cholecalciferol (VITAMIN D) 2000 UNITS CAPS Take 2 capsules by mouth daily.     Marland Kitchen doxycycline (VIBRA-TABS) 100 MG tablet Take 1 tablet (100 mg total) by mouth 2 (two) times daily. 20 tablet 0  . FeFum-FePo-FA-B Cmp-C-Zn-Mn-Cu (SE-TAN PLUS) 162-115.2-1 MG CAPS Take 1 capsule by mouth 2 (two) times daily. 180 capsule 3  . Fiber POWD Take 1 Scoop by mouth  daily as needed (bowel movement).     . fish oil-omega-3 fatty acids 1000 MG capsule Take 1 g by mouth 2 (two) times daily.     Marland Kitchen glimepiride (AMARYL) 4 MG tablet Take 0.5 tablets (2 mg total) by mouth 2 (two) times daily. 180 tablet 3  . glucose blood test strip 1 each by Other route 4 (four) times daily. 100 each 12  . LORazepam (ATIVAN) 0.5 MG tablet 1  qam  1  Midday  2  qhs 120 tablet 2  . losartan (COZAAR) 100 MG tablet TAKE ONE (1) TABLET EACH DAY 90 tablet 0  . metFORMIN (GLUCOPHAGE) 500 MG tablet TAKE 1 TABLET EACH MORNING AND 1 TABLETEACH EVENING. (Patient taking differently: Take 500-1,000 mg by mouth See admin instructions. Take 2 tablets (1,$RemoveBefo'000mg'uOwMWjNbbyC$ ) by mouth every morning and 1 tablet ($RemoveB'500mg'JyhnRahz$ ) by mouth every evening.) 180 tablet 3  . mirtazapine (REMERON SOL-TAB) 15 MG disintegrating tablet Take one 15 mg tablet each morning. 30 tablet 2  . mirtazapine (REMERON) 30 MG tablet 1  qhs 30 tablet 3  . Multiple Vitamins-Minerals (CENTRUM SILVER PO) Take 1 tablet by mouth daily.     Marland Kitchen omeprazole (PRILOSEC) 20 MG capsule Take 1 capsule (20 mg total) by mouth 2 (two) times daily before a meal. TAKE ONE (1) CAPSULE EACH DAY (Patient taking differently: Take 20 mg by mouth 2 (two) times daily before a meal. ) 180 capsule  1  . temazepam (RESTORIL) 15 MG capsule Take 1 capsule (15 mg total) by mouth at bedtime as needed for sleep. 30 capsule 2  . traMADol (ULTRAM) 50 MG tablet Take 1 tablet (50 mg total) by mouth every 6 (six) hours as needed for moderate pain. 20 tablet 0  . venlafaxine XR (EFFEXOR XR) 37.5 MG 24 hr capsule 2  qam 60 capsule 5   No current facility-administered medications for this visit.    Musculoskeletal: Strength & Muscle Tone: within normal limits Gait & Station: normal Patient leans: N/A  Psychiatric Specialty Exam: Review of Systems  There were no vitals taken for this visit.There is no height or weight on file to calculate BMI.  General Appearance: Casual  Eye  Contact:  Good  Speech:  Clear and Coherent  Volume:  Normal  Mood:  Depressed  Affect:  Appropriate  Thought Process:  Coherent  Orientation:  Full (Time, Place, and Person)  Thought Content:  WDL  Suicidal Thoughts:  No  Homicidal Thoughts:  No  Memory:  Negative  Judgement:  Good  Insight:  Good  Psychomotor Activity:  Normal  Concentration:    Recall:  Good  Fund of Knowledge:Fair  Language: Good  Akathisia:  No  Handed:  Right  AIMS (if indicated):  not done  Assets:  Desire for Improvement  ADL's:  Intact  Cognition: WNL  Sleep:  Good   Screenings: GAD-7     Office Visit from 03/27/2020 in [place] Office Visit from 12/05/2019 in [place] Visit from 09/10/2019 in [place]  Total GAD-7 Score 4 5 12     Mini-Mental     Clinical Support from 05/16/2017 in [place]  Total Score (max 30 points ) 29    PHQ2-9     Office Visit from 03/27/2020 in [place] Office Visit from 12/11/2019 in [place] Visit from 12/05/2019 in [place] Office Visit from 09/10/2019 in [place] Visit from 08/06/2019 in [place]  PHQ-2 Total Score 2 0 0 6 1  PHQ-9 Total Score 4 -- 1 12 --      Assessment and Plan:  12/3/202112:15 PM   This patient's first problem which is gotten worse with major depression.  Presently he takes Remeron 15 mg in the morning and 30 at night.  He has been on Effexor 37.5 mg 1 in the morning and today we will double it to increasing it to 2 every morning.  His second problem is adjustment disorder with an anxious mood state.  At this time we will continue taking Ativan.  We will go ahead and adjust the way he is taking.  Now take 0.5 mg 1 in the morning 1 midday and 2 at night.  Hopefully 2 at night together with the Restoril he is  taking will help him sleep through the night.  He will also continue taking his BuSpar 15 mg 1 in the morning and 2 at night.  So his third problem is that of insomnia.  Presently he is on Restoril 15 mg.  I think perhaps the most important intervention will be to arrange some form of psychotherapy for this patient.  His wife [name] seems to believe that he needs it.  The patient lives about 30 or 40 minutes away so would be difficult for him to be part of an IOP program.  This patient will be seen again in 6 weeks.  We will make an effort to find a therapist.

## 2020-06-30 ENCOUNTER — Other Ambulatory Visit: Payer: Self-pay | Admitting: Family Medicine

## 2020-07-04 ENCOUNTER — Other Ambulatory Visit: Payer: Self-pay | Admitting: Family Medicine

## 2020-07-08 ENCOUNTER — Telehealth: Payer: Self-pay

## 2020-07-08 NOTE — Telephone Encounter (Signed)
Patient aware and verbalizes understanding. 

## 2020-07-08 NOTE — Telephone Encounter (Signed)
Repeat blood pressure was within normal range.  May offer blood pressure check with the nurse and have him bring his blood pressure cuff and to make sure these are appropriate calibrated

## 2020-07-08 NOTE — Telephone Encounter (Signed)
Pt called to let Dr Dettinger know that his BP has been running higher than normal for about a week. Confirmed that he is taking his BP med every morning as directed. Pt says his BP last night was 134/92 with a pulse of 96 and this morning (before taking his BP med) it was 130/81 with a pulse of 80.  Wants to know what Dr Dettinger advises.

## 2020-07-08 NOTE — Telephone Encounter (Signed)
Covering PCP- please review

## 2020-07-14 ENCOUNTER — Ambulatory Visit (INDEPENDENT_AMBULATORY_CARE_PROVIDER_SITE_OTHER): Payer: Medicare Other

## 2020-07-14 DIAGNOSIS — Z0000 Encounter for general adult medical examination without abnormal findings: Secondary | ICD-10-CM

## 2020-07-14 NOTE — Patient Instructions (Signed)
  MEDICARE ANNUAL WELLNESS VISIT Health Maintenance Summary and Written Plan of Care  Mr. George Wang ,  Thank you for allowing me to perform your Medicare Annual Wellness Visit and for your ongoing commitment to your health.   Health Maintenance & Immunization History Health Maintenance  Topic Date Due  . COVID-19 Vaccine (3 - Moderna risk 4-dose series) 10/26/2019  . INFLUENZA VACCINE  02/17/2020  . FOOT EXAM  08/05/2020  . HEMOGLOBIN A1C  09/24/2020  . OPHTHALMOLOGY EXAM  02/24/2021  . TETANUS/TDAP  05/19/2021  . COLONOSCOPY (Pts 45-34yrs Insurance coverage will need to be confirmed)  06/20/2021  . Hepatitis C Screening  Completed  . PNA vac Low Risk Adult  Completed   Immunization History  Administered Date(s) Administered  . Fluad Quad(high Dose 65+) 04/06/2019  . H1N1 06/25/2008  . Influenza, High Dose Seasonal PF 04/14/2016, 05/09/2017, 05/02/2018  . Influenza,inj,Quad PF,6+ Mos 05/14/2013, 05/02/2014, 04/18/2015  . Influenza-Unspecified 04/14/2009, 05/06/2010, 05/20/2011, 05/02/2012  . Moderna Sars-Covid-2 Vaccination 08/30/2019, 09/28/2019  . Pneumococcal Conjugate-13 06/19/2013, 05/02/2014  . Pneumococcal Polysaccharide-23 01/15/2010  . Td 05/20/2011  . Tdap 05/20/2011  . Zoster 01/17/2007    These are the patient goals that we discussed: Goals Addressed            This Visit's Progress   . Exercise 150 min/wk Moderate Activity   Not on track    Chair exercises or water exercises     . Have 3 meals a day   On track    Plan 3 meals a day. Have a larger breakfast, medium lunch, and smaller supper. This will help balance out your blood sugar. Eat mostly vegetables and lean proteins. Small amounts of low sugar fruits are also ago. Avoid very ripe fruits.     . Prevent Falls   Not on track    Add motion sensor nightlights in pathway to bathroom.        This is a list of Health Maintenance Items that are overdue or due now: Health Maintenance Due  Topic Date  Due  . COVID-19 Vaccine (3 - Moderna risk 4-dose series) 10/26/2019  . INFLUENZA VACCINE  02/17/2020     Orders/Referrals Placed Today: No orders of the defined types were placed in this encounter.  (Contact our referral department at [phone] if you have not spoken with someone about your referral appointment within the next 5 days)    Follow-up Plan  Scheduled with Dr. Louanne Skye on 07/28/20 at 10:40am.

## 2020-07-14 NOTE — Progress Notes (Signed)
MEDICARE ANNUAL WELLNESS VISIT  07/14/2020  Telephone Visit Disclaimer This Medicare AWV was conducted by telephone due to national recommendations for restrictions regarding the COVID-19 Pandemic (e.g. social distancing).  I verified, using two identifiers, that I am speaking with George Wang or their authorized healthcare agent. I discussed the limitations, risks, security, and privacy concerns of performing an evaluation and management service by telephone and the potential availability of an in-person appointment in the future. The patient expressed understanding and agreed to proceed.   Location of Patient: Home Location of Provider (nurse):  [facility]  Subjective:    George Wang is a 74 y.o. male patient of Dettinger, Fransisca Kaufmann, MD who had a Medicare Annual Wellness Visit today via telephone. George Wang is Retired and lives with their spouse. he has one child living and one child that is deceased. he reports that he is not socially active and does interact with friends/family regularly. he is not physically active.  Patient Care Team: Dettinger, Fransisca Kaufmann, MD as PCP - General (Family Medicine) Minus Breeding, MD as PCP - Cardiology (Cardiology) Druscilla Brownie, MD (Dermatology) Irine Seal, MD (Urology) Minus Breeding, MD (Cardiology) Pyrtle, Lajuan Lines, MD as Consulting Physician (Gastroenterology) Ilean China, RN as Registered Nurse  Advanced Directives 09/16/2019 07/06/2019 07/13/2018 06/20/2018 05/30/2018 05/16/2017  Does Patient Have a Medical Advance Directive? Yes Yes No No No No  Type of Paramedic of [place];Living will Living will;Healthcare Power of Attorney - - - -  Does patient want to make changes to medical advance directive? - No - Patient declined - - - -  Copy of [place] in Chart? No - copy requested No - copy requested - - - -  Would patient like information on creating a medical  advance directive? - - - - Yes (MAU/Ambulatory/Procedural Areas - Information given) Yes (MAU/Ambulatory/Procedural Areas - Information given)  Some encounter information is confidential and restricted. Go to Review Flowsheets activity to see all data.    Hospital Utilization Over the Past 12 Months: # of hospitalizations or ER visits: 1 # of surgeries: 0  Review of Systems    Patient reports that his overall health is unchanged compared to last year.  Patient Reported Readings (BP, Pulse, CBG, Weight, etc) none  Pain Assessment Pain : No/denies pain     Current Medications & Allergies (verified) Allergies as of 07/14/2020   No Known Allergies     Medication List       Accurate as of July 14, 2020 10:31 AM. If you have any questions, ask your nurse or doctor.        acetaminophen 325 MG tablet Commonly known as: TYLENOL Take 650 mg by mouth every 6 (six) hours as needed for mild pain.   amLODipine 10 MG tablet Commonly known as: NORVASC Take 1 tablet (10 mg total) by mouth daily.   atorvastatin 80 MG tablet Commonly known as: LIPITOR Take 0.5 tablets (40 mg total) by mouth daily at 6 PM.   beta carotene 25000 UNIT capsule Take 25,000 Units by mouth daily.   blood glucose meter kit and supplies Dispense based on patient and insurance preference. Use up to four times daily as directed. (FOR ICD-10 E10.9, E11.9).   busPIRone 15 MG tablet Commonly known as: BUSPAR 1  qam   2  qhs   CENTRUM SILVER PO Take 1 tablet by mouth daily.   chlorthalidone 25 MG tablet Commonly known  as: HYGROTON TAKE 1 TABLET DAILY FOR BLOOD PRESSURE   doxycycline 100 MG tablet Commonly known as: VIBRA-TABS Take 1 tablet (100 mg total) by mouth 2 (two) times daily.   Fiber Powd Take 1 Scoop by mouth daily as needed (bowel movement).   fish oil-omega-3 fatty acids 1000 MG capsule Take 1 g by mouth 2 (two) times daily.   glimepiride 4 MG tablet Commonly known as:  AMARYL Take 0.5 tablets (2 mg total) by mouth 2 (two) times daily.   LORazepam 0.5 MG tablet Commonly known as: Ativan 1  qam  1  Midday  2  qhs   losartan 100 MG tablet Commonly known as: COZAAR TAKE ONE (1) TABLET EACH DAY   metFORMIN 500 MG tablet Commonly known as: GLUCOPHAGE TAKE 1 TABLET EACH MORNING AND 1 TABLETEACH EVENING. What changed:   how much to take  how to take this  when to take this  additional instructions   mirtazapine 30 MG tablet Commonly known as: Remeron 1  qhs   mirtazapine 15 MG disintegrating tablet Commonly known as: REMERON SOL-TAB Take one 15 mg tablet each morning.   omeprazole 20 MG capsule Commonly known as: PRILOSEC Take 1 capsule (20 mg total) by mouth 2 (two) times daily before a meal. TAKE ONE (1) CAPSULE EACH DAY What changed: additional instructions   OneTouch Ultra test strip Generic drug: glucose blood USE UP TO 4 TIMES PER DAY   OT ULTRA/FASTTK CNTRL SOLN Soln Use with meter as directed Dx E11.9   Se-Tan PLUS 162-115.2-1 MG Caps Take 1 capsule by mouth 2 (two) times daily.   temazepam 15 MG capsule Commonly known as: RESTORIL Take 1 capsule (15 mg total) by mouth at bedtime as needed for sleep.   traMADol 50 MG tablet Commonly known as: ULTRAM Take 1 tablet (50 mg total) by mouth every 6 (six) hours as needed for moderate pain.   venlafaxine XR 37.5 MG 24 hr capsule Commonly known as: Effexor XR 2  qam   Vitamin D 50 MCG (2000 UT) Caps Take 2 capsules by mouth daily.       History (reviewed): Past Medical History:  Diagnosis Date  . Allergy    mild  . Anemia    Iron deficiency  . Anxiety state, unspecified   . Aortic atherosclerosis ([place])   . Arthritis   . Calculus of kidney    kidney stones  - normal BMP   . Cancer [facility])    Prostate, skin cancer x 2   . Cataract    forming both eyes  . Depressive disorder, not elsewhere classified   . Diverticulosis of colon (without mention of hemorrhage)    . GERD (gastroesophageal reflux disease)    on nexium  . Gout, unspecified   . Heart murmur   . Hyperlipidemia   . Hypertension, essential   . Irritable bowel syndrome   . Neuromuscular disorder (HCC)    numbness in knees from back issues   . Personal history of malignant neoplasm of prostate   . PVC (premature ventricular contraction)   . Type II or unspecified type diabetes mellitus without mention of complication, not stated as uncontrolled    Past Surgical History:  Procedure Laterality Date  . COLONOSCOPY    . HERNIA REPAIR     left side  . PROSTATE SURGERY    . skin cancer removed     chest x 2 3-4 yrs ago   . TONSILLECTOMY  Family History  Problem Relation Age of Onset  . Cancer Maternal Aunt        breast  . Pancreatic cancer Paternal Aunt   . Prostate cancer Paternal Uncle   . Heart disease Maternal Grandmother 69  . Heart disease Maternal Grandfather        fluid / CHF   . Heart disease Paternal Grandfather 67  . Heart attack Paternal Grandfather        passed at 74  . Prostate cancer Father   . Coronary artery disease Father 28       CABG  . Hypertension Mother   . Hyperlipidemia Mother   . Other Daughter        accident with tornado 09/13/1996  . Hypertension Son   . Colon cancer Neg Hx   . Colon polyps Neg Hx   . Esophageal cancer Neg Hx   . Rectal cancer Neg Hx   . Stomach cancer Neg Hx    Social History   Socioeconomic History  . Marital status: Married    Spouse name: [name]  . Number of children: 2  . Years of education: 44  . Highest education level: Some college, no degree  Occupational History  . Occupation: Production designer, theatre/television/film    Comment: Part time  Tobacco Use  . Smoking status: Former Smoker    Packs/day: 1.00    Years: 19.00    Pack years: 19.00    Types: Cigarettes    Quit date: 10/13/1978    Years since quitting: 41.7  . Smokeless tobacco: Former Systems developer    Types: Snuff    Quit date: 05/30/2008  Vaping Use  . Vaping  Use: Never used  Substance and Sexual Activity  . Alcohol use: Not Currently  . Drug use: No  . Sexual activity: Yes  Other Topics Concern  . Not on file  Social History Narrative   Lives at home with wife.  He has one son and a daughter that died in a tornado in 71. 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 He has one living grandson and one grandson that passed away in 09-13-16 at age 41 from cancer.   Social Determinants of Health   Financial Resource Strain: Not on file  Food Insecurity: Not on file  Transportation Needs: Not on file  Physical Activity: Not on file  Stress: Not on file  Social Connections: Not on file    Activities of Daily Living In your present state of health, do you have any difficulty performing the following activities: 07/14/2020  Hearing? N  Vision? N  Difficulty concentrating or making decisions? N  Walking or climbing stairs? N  Dressing or bathing? N  Doing errands, shopping? N  Preparing Food and eating ? N  Using the Toilet? N  In the past six months, have you accidently leaked urine? N  Do you have problems with loss of bowel control? N  Managing your Medications? N  Managing your Finances? N  Housekeeping or managing your Housekeeping? N  Some encounter information is confidential and restricted. Go to Review Flowsheets activity to see all data.  Some recent data might be hidden    Patient Education/ Literacy How often do you need to have someone help you when you read instructions, pamphlets, or other written materials from your doctor or pharmacy?: 1 - Never What is the last grade level you completed in school?: 12th grade  Exercise Current Exercise Habits: The patient does not  participate in regular exercise at present, Exercise limited by: orthopedic condition(s)  Diet Patient reports consuming 3 meals a day and 2 snack(s) a day Patient reports that his primary diet is: Regular Patient reports that she does have regular  access to food.   Depression Screen PHQ 2/9 Scores 07/14/2020 03/27/2020 12/11/2019 12/05/2019 09/10/2019 08/06/2019 07/06/2019  PHQ - 2 Score 2 2 0 0 6 1 0  PHQ- 9 Score - 4 - 1 12 - -     Fall Risk Fall Risk  07/14/2020 03/27/2020 12/11/2019 12/05/2019 09/10/2019  Falls in the past year? 1 0 0 1 0  Comment - - - - -  Number falls in past yr: 1 - - 1 -  Injury with Fall? 0 - - 0 -  Risk for fall due to : History of fall(s) - - History of fall(s) -  Risk for fall due to: Comment - - - - -  Follow up Falls evaluation completed - - Falls evaluation completed -  Comment - - - - -     Objective:  George Wang seemed alert and oriented and he participated appropriately during our telephone visit.  Blood Pressure Weight BMI  BP Readings from Last 3 Encounters:  03/27/20 108/61  12/11/19 139/66  12/07/19 (!) 142/38   Wt Readings from Last 3 Encounters:  03/27/20 186 lb (84.4 kg)  12/11/19 178 lb 3.2 oz (80.8 kg)  12/07/19 183 lb 12.8 oz (83.4 kg)   BMI Readings from Last 1 Encounters:  03/27/20 32.95 kg/m    *Unable to obtain current vital signs, weight, and BMI due to telephone visit type  Hearing/Vision  . Lewellyn did not seem to have difficulty with hearing/understanding during the telephone conversation . Reports that he has not had a formal eye exam by an eye care professional within the past year . Reports that he has not had a formal hearing evaluation within the past year *Unable to fully assess hearing and vision during telephone visit type  Cognitive Function: 6CIT Screen 07/14/2020 07/06/2019 05/30/2018  What Year? 0 points 0 points 0 points  What month? 0 points 0 points 0 points  What time? 0 points 0 points 0 points  Count back from 20 0 points 0 points 0 points  Months in reverse 0 points 0 points 0 points  Repeat phrase 0 points 0 points 0 points  Total Score 0 0 0   (Normal:0-7, Significant for Dysfunction: >8)  Normal Cognitive Function Screening:  Yes   Immunization & Health Maintenance Record Immunization History  Administered Date(s) Administered  . Fluad Quad(high Dose 65+) 04/06/2019  . H1N1 06/25/2008  . Influenza, High Dose Seasonal PF 04/14/2016, 05/09/2017, 05/02/2018  . Influenza,inj,Quad PF,6+ Mos 05/14/2013, 05/02/2014, 04/18/2015  . Influenza-Unspecified 04/14/2009, 05/06/2010, 05/20/2011, 05/02/2012  . Moderna Sars-Covid-2 Vaccination 08/30/2019, 09/28/2019  . Pneumococcal Conjugate-13 06/19/2013, 05/02/2014  . Pneumococcal Polysaccharide-23 01/15/2010  . Td 05/20/2011  . Tdap 05/20/2011  . Zoster 01/17/2007    Health Maintenance  Topic Date Due  . COVID-19 Vaccine (3 - Moderna risk 4-dose series) 10/26/2019  . INFLUENZA VACCINE  02/17/2020  . FOOT EXAM  08/05/2020  . HEMOGLOBIN A1C  09/24/2020  . OPHTHALMOLOGY EXAM  02/24/2021  . TETANUS/TDAP  05/19/2021  . COLONOSCOPY (Pts 45-7yr Insurance coverage will need to be confirmed)  06/20/2021  . Hepatitis C Screening  Completed  . PNA vac Low Risk Adult  Completed       Assessment  This is a routine  wellness examination for George Wang.  Health Maintenance: Due or Overdue Health Maintenance Due  Topic Date Due  . COVID-19 Vaccine (3 - Moderna risk 4-dose series) 10/26/2019  . INFLUENZA VACCINE  02/17/2020    George Wang does not need a referral for Community Assistance: Care Management:   no Social Work:    no Prescription Assistance:  no Nutrition/Diabetes Education:  no   Plan:  Personalized Goals Goals Addressed            This Visit's Progress   . Exercise 150 min/wk Moderate Activity   Not on track    Chair exercises or water exercises     . Have 3 meals a day   On track    Plan 3 meals a day. Have a larger breakfast, medium lunch, and smaller supper. This will help balance out your blood sugar. Eat mostly vegetables and lean proteins. Small amounts of low sugar fruits are also ago. Avoid very ripe fruits.     . Prevent  Falls   Not on track    Add motion sensor nightlights in pathway to bathroom.      Personalized Health Maintenance & Screening Recommendations  Influenza vaccine and Covid Booster  Lung Cancer Screening Recommended: no (Low Dose CT Chest recommended if Age 76-80 years, 30 pack-year currently smoking OR have quit w/in past 15 years) Hepatitis C Screening recommended: no HIV Screening recommended: no  Advanced Directives: Written information was not prepared per patient's request.  Referrals & Orders No orders of the defined types were placed in this encounter.   Follow-up Plan . Follow-up with Dettinger, Fransisca Kaufmann, MD as planned . Schedule 07/28/20    I have personally reviewed and noted the following in the patient's chart:   . Medical and social history . Use of alcohol, tobacco or illicit drugs  . Current medications and supplements . Functional ability and status . Nutritional status . Physical activity . Advanced directives . List of other physicians . Hospitalizations, surgeries, and ER visits in previous 12 months . Vitals . Screenings to include cognitive, depression, and falls . Referrals and appointments  In addition, I have reviewed and discussed with George Wang certain preventive protocols, quality metrics, and best practice recommendations. A written personalized care plan for preventive services as well as general preventive health recommendations is available and can be mailed to the patient at his request.      [facility]  21/05/5519

## 2020-07-17 ENCOUNTER — Other Ambulatory Visit: Payer: Self-pay | Admitting: Family Medicine

## 2020-07-28 ENCOUNTER — Other Ambulatory Visit: Payer: Self-pay | Admitting: Family Medicine

## 2020-07-28 ENCOUNTER — Other Ambulatory Visit: Payer: Self-pay

## 2020-07-28 ENCOUNTER — Ambulatory Visit (INDEPENDENT_AMBULATORY_CARE_PROVIDER_SITE_OTHER): Payer: Medicare Other | Admitting: Family Medicine

## 2020-07-28 ENCOUNTER — Encounter: Payer: Self-pay | Admitting: Family Medicine

## 2020-07-28 VITALS — BP 125/65 | HR 103 | Ht 63.0 in | Wt 184.0 lb

## 2020-07-28 DIAGNOSIS — Z23 Encounter for immunization: Secondary | ICD-10-CM

## 2020-07-28 DIAGNOSIS — E782 Mixed hyperlipidemia: Secondary | ICD-10-CM | POA: Diagnosis not present

## 2020-07-28 DIAGNOSIS — I10 Essential (primary) hypertension: Secondary | ICD-10-CM | POA: Diagnosis not present

## 2020-07-28 DIAGNOSIS — R002 Palpitations: Secondary | ICD-10-CM

## 2020-07-28 DIAGNOSIS — E1169 Type 2 diabetes mellitus with other specified complication: Secondary | ICD-10-CM | POA: Diagnosis not present

## 2020-07-28 LAB — BAYER DCA HB A1C WAIVED: HB A1C (BAYER DCA - WAIVED): 6.9 % (ref <7.0)

## 2020-07-28 MED ORDER — AMLODIPINE BESYLATE 10 MG PO TABS: 10.0000 mg | ORAL_TABLET | Freq: Every day | ORAL | 3 refills | Status: DC

## 2020-07-28 MED ORDER — ATORVASTATIN CALCIUM 80 MG PO TABS: 40.0000 mg | ORAL_TABLET | Freq: Every day | ORAL | 3 refills | Status: DC

## 2020-07-28 MED ORDER — LOSARTAN POTASSIUM 100 MG PO TABS: 100.0000 mg | ORAL_TABLET | Freq: Every day | ORAL | 3 refills | Status: DC

## 2020-07-28 MED ORDER — METFORMIN HCL 500 MG PO TABS: 500.0000 mg | ORAL_TABLET | ORAL | 3 refills | Status: DC

## 2020-07-28 MED ORDER — GLIMEPIRIDE 4 MG PO TABS: 2.0000 mg | ORAL_TABLET | Freq: Two times a day (BID) | ORAL | 3 refills | Status: DC

## 2020-07-28 MED ORDER — CHLORTHALIDONE 15 MG PO TABS: 15.0000 mg | ORAL_TABLET | Freq: Every day | ORAL | 3 refills | Status: DC

## 2020-07-28 NOTE — Progress Notes (Signed)
BP 125/65   Pulse (!) 103   Ht 5' 3" (1.6 m)   Wt 184 lb (83.5 kg)   SpO2 98%   BMI 32.59 kg/m    Subjective:   Patient ID: George Wang, male    DOB: 1946/04/21, 75 y.o.   MRN: [id number]  HPI: George Wang is a 75 y.o. male presenting on 07/28/2020 for Medical Management of Chronic Issues, Diabetes, Hypertension, and Hyperlipidemia   HPI Type 2 diabetes mellitus Patient comes in today for recheck of his diabetes. Patient has been currently taking metformin and A1c is 6.9.. Patient is currently on an ACE inhibitor/ARB. Patient has seen an ophthalmologist this year. Patient denies any issues with their feet. The symptom started onset as an adult hypertension and hyperlipidemia ARE RELATED TO DM   Hypertension Patient is currently on amlodipine and chlorthalidone and losartan, and their blood pressure today is 125/65.  Patient is having occasional lightheadedness and dizziness when he stands up, usually happens once or twice a day but not always every day.  He says has been going on for at least the past few months.  He says he gets occasional flutters and palpitations with that as well.. Patient denies headaches, blurred vision, chest pains, shortness of breath, or weakness. Denies any side effects from medication and is content with current medication.   Hyperlipidemia Patient is coming in for recheck of his hyperlipidemia. The patient is currently taking lipitor. They deny any issues with myalgias or history of liver damage from it. They deny any focal numbness or weakness or chest pain.   Relevant past medical, surgical, family and social history reviewed and updated as indicated. Interim medical history since our last visit reviewed. Allergies and medications reviewed and updated.  Review of Systems  Constitutional: Negative for chills and fever.  Eyes: Negative for visual disturbance.  Respiratory: Negative for shortness of breath and wheezing.   Cardiovascular: Positive  for palpitations. Negative for chest pain and leg swelling.  Musculoskeletal: Negative for back pain and gait problem.  Skin: Negative for rash.  Neurological: Positive for dizziness and light-headedness.  All other systems reviewed and are negative.   Per HPI unless specifically indicated above   Allergies as of 07/28/2020   No Known Allergies     Medication List       Accurate as of July 28, 2020 11:11 AM. If you have any questions, ask your nurse or doctor.        STOP taking these medications   traMADol 50 MG tablet Commonly known as: ULTRAM Stopped by: Fransisca Kaufmann Dettinger, MD     TAKE these medications   acetaminophen 325 MG tablet Commonly known as: TYLENOL Take 650 mg by mouth every 6 (six) hours as needed for mild pain.   amLODipine 10 MG tablet Commonly known as: NORVASC TAKE ONE (1) TABLET EACH DAY   atorvastatin 80 MG tablet Commonly known as: LIPITOR Take 0.5 tablets (40 mg total) by mouth daily at 6 PM.   beta carotene 25000 UNIT capsule Take 25,000 Units by mouth daily.   blood glucose meter kit and supplies Dispense based on patient and insurance preference. Use up to four times daily as directed. (FOR ICD-10 E10.9, E11.9).   busPIRone 15 MG tablet Commonly known as: BUSPAR 1  qam   2  qhs   CENTRUM SILVER PO Take 1 tablet by mouth daily.   chlorthalidone 25 MG tablet Commonly known as: HYGROTON TAKE 1 TABLET DAILY FOR  BLOOD PRESSURE   doxycycline 100 MG tablet Commonly known as: VIBRA-TABS Take 1 tablet (100 mg total) by mouth 2 (two) times daily.   Fiber Powd Take 1 Scoop by mouth daily as needed (bowel movement).   fish oil-omega-3 fatty acids 1000 MG capsule Take 1 g by mouth 2 (two) times daily.   glimepiride 4 MG tablet Commonly known as: AMARYL Take 0.5 tablets (2 mg total) by mouth 2 (two) times daily.   LORazepam 0.5 MG tablet Commonly known as: Ativan 1  qam  1  Midday  2  qhs   losartan 100 MG tablet Commonly known  as: COZAAR TAKE ONE (1) TABLET EACH DAY   metFORMIN 500 MG tablet Commonly known as: GLUCOPHAGE TAKE 1 TABLET EACH MORNING AND 1 TABLETEACH EVENING. What changed:   how much to take  how to take this  when to take this  additional instructions   mirtazapine 30 MG tablet Commonly known as: Remeron 1  qhs   mirtazapine 15 MG disintegrating tablet Commonly known as: REMERON SOL-TAB Take one 15 mg tablet each morning.   omeprazole 20 MG capsule Commonly known as: PRILOSEC Take 1 capsule (20 mg total) by mouth 2 (two) times daily before a meal. TAKE ONE (1) CAPSULE EACH DAY What changed: additional instructions   OneTouch Ultra test strip Generic drug: glucose blood USE UP TO 4 TIMES PER DAY   OT ULTRA/FASTTK CNTRL SOLN Soln Use with meter as directed Dx E11.9   Se-Tan PLUS 162-115.2-1 MG Caps Take 1 capsule by mouth 2 (two) times daily.   temazepam 15 MG capsule Commonly known as: RESTORIL Take 1 capsule (15 mg total) by mouth at bedtime as needed for sleep.   venlafaxine XR 37.5 MG 24 hr capsule Commonly known as: Effexor XR 2  qam   Vitamin D 50 MCG (2000 UT) Caps Take 2 capsules by mouth daily.        Objective:   Ht 5' 3" (1.6 m)   Wt 184 lb (83.5 kg)   BMI 32.59 kg/m   Wt Readings from Last 3 Encounters:  07/28/20 184 lb (83.5 kg)  03/27/20 186 lb (84.4 kg)  12/11/19 178 lb 3.2 oz (80.8 kg)    Physical Exam Vitals and nursing note reviewed.  Constitutional:      General: He is not in acute distress.    Appearance: He is well-developed and well-nourished. He is not diaphoretic.  Eyes:     General: No scleral icterus.    Extraocular Movements: EOM normal.     Conjunctiva/sclera: Conjunctivae normal.  Neck:     Thyroid: No thyromegaly.  Cardiovascular:     Rate and Rhythm: Normal rate and regular rhythm.     Pulses: Intact distal pulses.     Heart sounds: Normal heart sounds. No murmur heard.   Pulmonary:     Effort: Pulmonary effort is  normal. No respiratory distress.     Breath sounds: Normal breath sounds. No wheezing.  Musculoskeletal:        General: No edema. Normal range of motion.     Cervical back: Neck supple.  Lymphadenopathy:     Cervical: No cervical adenopathy.  Skin:    General: Skin is warm and dry.     Findings: No rash.  Neurological:     Mental Status: He is alert and oriented to person, place, and time.     Coordination: Coordination normal.  Psychiatric:        Mood and  Affect: Mood and affect normal.        Behavior: Behavior normal.     EKG 1 PVC, otherwise normal sinus rhythm, heart rate 86  Assessment & Plan:   Problem List Items Addressed This Visit      Cardiovascular and Mediastinum   Essential hypertension   Relevant Medications   amLODipine (NORVASC) 10 MG tablet   atorvastatin (LIPITOR) 80 MG tablet   chlorthalidone (HYGROTEN) 15 MG tablet   losartan (COZAAR) 100 MG tablet   Other Relevant Orders   BMP8+EGFR   CBC with Differential/Platelet   Bayer DCA Hb A1c Waived (Completed)     Endocrine   Type 2 diabetes mellitus (HCC) - Primary   Relevant Medications   atorvastatin (LIPITOR) 80 MG tablet   glimepiride (AMARYL) 4 MG tablet   losartan (COZAAR) 100 MG tablet   metFORMIN (GLUCOPHAGE) 500 MG tablet   Other Relevant Orders   BMP8+EGFR   CBC with Differential/Platelet   Bayer DCA Hb A1c Waived (Completed)     Other   Hyperlipemia   Relevant Medications   amLODipine (NORVASC) 10 MG tablet   atorvastatin (LIPITOR) 80 MG tablet   chlorthalidone (HYGROTEN) 15 MG tablet   losartan (COZAAR) 100 MG tablet   Other Relevant Orders   BMP8+EGFR   CBC with Differential/Platelet   Bayer DCA Hb A1c Waived (Completed)    Other Visit Diagnoses    Need for immunization against influenza       Relevant Orders   Flu Vaccine QUAD High Dose(Fluad) (Completed)   Palpitations       Relevant Orders   EKG 12-Lead (Completed)      EKG looks normal, follow-up with cardiology  if continues to have issues, will lower chlorthalidone dose to help with orthostatic dizziness.  Follow-up with cardiologist if issues persist. Follow up plan: Return in about 4 months (around 11/25/2020), or if symptoms worsen or fail to improve, for Diabetes and hypertension and cholesterol.  Counseling provided for all of the vaccine components Orders Placed This Encounter  Procedures  . Flu Vaccine QUAD High Dose(Fluad)  . BMP8+EGFR  . CBC with Differential/Platelet  . [facility] Hb A1c [place], MD [facility] 07/28/2020, 11:11 AM

## 2020-07-29 ENCOUNTER — Telehealth (INDEPENDENT_AMBULATORY_CARE_PROVIDER_SITE_OTHER): Payer: Medicare Other | Admitting: Psychiatry

## 2020-07-29 DIAGNOSIS — F329 Major depressive disorder, single episode, unspecified: Secondary | ICD-10-CM

## 2020-07-29 LAB — CBC WITH DIFFERENTIAL/PLATELET
Basophils Absolute: 0.1 10*3/uL (ref 0.0–0.2)
Basos: 1 %
EOS (ABSOLUTE): 0.2 10*3/uL (ref 0.0–0.4)
Eos: 2 %
Hematocrit: 38.6 % (ref 37.5–51.0)
Hemoglobin: 13.2 g/dL (ref 13.0–17.7)
Immature Grans (Abs): 0 10*3/uL (ref 0.0–0.1)
Immature Granulocytes: 0 %
Lymphocytes Absolute: 1.9 10*3/uL (ref 0.7–3.1)
Lymphs: 19 %
MCH: 33.6 pg — ABNORMAL HIGH (ref 26.6–33.0)
MCHC: 34.2 g/dL (ref 31.5–35.7)
MCV: 98 fL — ABNORMAL HIGH (ref 79–97)
Monocytes Absolute: 0.9 10*3/uL (ref 0.1–0.9)
Monocytes: 9 %
Neutrophils Absolute: 7 10*3/uL (ref 1.4–7.0)
Neutrophils: 69 %
Platelets: 266 10*3/uL (ref 150–450)
RBC: 3.93 x10E6/uL — ABNORMAL LOW (ref 4.14–5.80)
RDW: 12.4 % (ref 11.6–15.4)
WBC: 10.2 10*3/uL (ref 3.4–10.8)

## 2020-07-29 LAB — BMP8+EGFR
BUN/Creatinine Ratio: 20 (ref 10–24)
BUN: 19 mg/dL (ref 8–27)
CO2: 29 mmol/L (ref 20–29)
Calcium: 9.5 mg/dL (ref 8.6–10.2)
Chloride: 100 mmol/L (ref 96–106)
Creatinine, Ser: 0.96 mg/dL (ref 0.76–1.27)
GFR calc Af Amer: 88 mL/min/{1.73_m2} (ref >59)
GFR calc non Af Amer: 76 mL/min/{1.73_m2} (ref >59)
Glucose: 162 mg/dL — ABNORMAL HIGH (ref 65–99)
Potassium: 4 mmol/L (ref 3.5–5.2)
Sodium: 141 mmol/L (ref 134–144)

## 2020-07-29 MED ORDER — TEMAZEPAM 15 MG PO CAPS: ORAL_CAPSULE | ORAL | 2 refills | Status: DC

## 2020-07-29 MED ORDER — MIRTAZAPINE 15 MG PO TBDP: ORAL_TABLET | ORAL | 2 refills | Status: DC

## 2020-07-29 MED ORDER — LORAZEPAM 0.5 MG PO TABS: ORAL_TABLET | ORAL | 2 refills | Status: DC

## 2020-07-29 MED ORDER — MIRTAZAPINE 30 MG PO TABS
ORAL_TABLET | ORAL | 3 refills | Status: DC
Start: 2020-07-29 — End: 2020-09-16

## 2020-07-29 MED ORDER — VENLAFAXINE HCL ER 75 MG PO CP24: ORAL_CAPSULE | ORAL | 5 refills | Status: DC

## 2020-07-29 MED ORDER — BUSPIRONE HCL 15 MG PO TABS
ORAL_TABLET | ORAL | 5 refills | Status: DC
Start: 2020-07-29 — End: 2020-09-16

## 2020-07-29 NOTE — [employer] [facility] (Signed)
Psychiatric Initial Adult Assessment   Patient Identification: George Wang MRN:  [id number] Date of Evaluation:  07/29/2020 Referral Source: Community/emergency room Chief Complaint:   Visit Diagnosis: Major depression  History of Present Illness:      Today the patient is seen with his wife [name].  Overall he is still somewhat vague quantitatively about how much he is depressed.  His mood is overall better in the last month or 2.  In fact he says that he says he no longer feels anxious and is no longer crying persistently.  His anxiety attacks seem to be less.  He still able to enjoy things like television his dogs and his grandchildren.  I believe his ability to think and concentrate is pretty stable.  His appetite is okay as his weight has not changed.  His sleep still seems to be an issue.  He shares that it may be that in the past when he was taking 2 Restoril he slept much better.  At this time for reasons that are not clear he is only taking 1 Restoril is that all that was ordered.  But he also takes 2 Ativan at night and 30 mg of Remeron.  Apparently about half the nights it takes him more than an hour to fall to sleep.  He also often wakes up earlier than he needs to.  But he does not take naps as he says he just can't.  I suggest to him that he can't because he is probably gotten enough sleep.  He is not really sleepy although he describes himself as feeling energyless.  The patient says he has problems walking and his back is bothering him but he says that is probably because he has been lying around too much.  I suspect he is somatic.  He denies worthlessness and certainly is not suicidal.  He drinks no alcohol and has no psychotic symptomatology.  Today we talked about other options but importantly about the issue of psychotherapy.  Apparently there was some confusion about him starting in therapy with the they live in but there is nobody here name Jeani Hawking.  Efforts were made to contact [facility] and to have him see their therapist whose name is [employer].  We will attempt to clarify this.  Overall I think the patient has shown some improvement.  It is hard for him to appreciate it.  I believe his wife believes he is also shown a marginal level of improvement.  He certainly is not getting worse. Associated Signs/Symptoms: Depression Symptoms:  depressed mood, (Hypo) Manic Symptoms:   Anxiety Symptoms:  Excessive Worry, Psychotic Symptoms:   PTSD Symptoms:   Past Psychiatric History: 1 psychiatric hospitalization in 1989 presently on Lexapro 10 mg, BuSpar 30 mg  Previous Psychotropic Medications: Yes   Substance Abuse History in the last 12 months:  No.  Consequences of Substance Abuse:   Past Medical History:  Past Medical History:  Diagnosis Date  . Allergy    mild  . Anemia    Iron deficiency  . Anxiety state, unspecified   . Aortic atherosclerosis ([place])   . Arthritis   . Calculus of kidney    kidney stones  - normal BMP   . Cancer [facility])    Prostate, skin cancer x 2   . Cataract    forming both eyes  . Depressive disorder, not elsewhere classified   . Diverticulosis of colon (without mention of hemorrhage)   . GERD (gastroesophageal reflux  disease)    on nexium  . Gout, unspecified   . Heart murmur   . Hyperlipidemia   . Hypertension, essential   . Irritable bowel syndrome   . Neuromuscular disorder (HCC)    numbness in knees from back issues   . Personal history of malignant neoplasm of prostate   . PVC (premature ventricular contraction)   . Type II or unspecified type diabetes mellitus without mention of complication, not stated as uncontrolled     Past Surgical History:  Procedure Laterality Date  . COLONOSCOPY    . HERNIA REPAIR     left side  . PROSTATE SURGERY    . skin cancer removed     chest x 2 3-4 yrs ago   . TONSILLECTOMY      Family Psychiatric History:   Family History:  Family History  Problem Relation Age of Onset  .  Cancer Maternal Aunt        breast  . Pancreatic cancer Paternal Aunt   . Prostate cancer Paternal Uncle   . Heart disease Maternal Grandmother 69  . Heart disease Maternal Grandfather        fluid / CHF   . Heart disease Paternal Grandfather 32  . Heart attack Paternal Grandfather        passed at 72  . Prostate cancer Father   . Coronary artery disease Father 65       CABG  . Hypertension Mother   . Hyperlipidemia Mother   . Other Daughter        accident with tornado Sep 25, 1996  . Hypertension Son   . Colon cancer Neg Hx   . Colon polyps Neg Hx   . Esophageal cancer Neg Hx   . Rectal cancer Neg Hx   . Stomach cancer Neg Hx     Social History:   Social History   Socioeconomic History  . Marital status: Married    Spouse name: [name]  . Number of children: 2  . Years of education: 59  . Highest education level: Some college, no degree  Occupational History  . Occupation: Production designer, theatre/television/film    Comment: Part time  Tobacco Use  . Smoking status: Former Smoker    Packs/day: 1.00    Years: 19.00    Pack years: 19.00    Types: Cigarettes    Quit date: 10/13/1978    Years since quitting: 41.8  . Smokeless tobacco: Former Systems developer    Types: Snuff    Quit date: 05/30/2008  Vaping Use  . Vaping Use: Never used  Substance and Sexual Activity  . Alcohol use: Not Currently  . Drug use: No  . Sexual activity: Yes  Other Topics Concern  . Not on file  Social History Narrative   Lives at home with wife.  He has one son and a daughter that died in a tornado in 72. 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 He has one living grandson and one grandson that passed away in 09/25/2016 at age 47 from cancer.   Social Determinants of Health   Financial Resource Strain: Not on file  Food Insecurity: Not on file  Transportation Needs: Not on file  Physical Activity: Not on file  Stress: Not on file  Social Connections: Not on file    Additional Social History:    Allergies:  No Known Allergies  Metabolic Disorder Labs: Lab Results  Component Value Date   HGBA1C 6.9 07/28/2020   No results  found for: PROLACTIN Lab Results  Component Value Date   CHOL 107 03/27/2020   TRIG 57 03/27/2020   HDL 37 (L) 03/27/2020   CHOLHDL 2.9 03/27/2020   LDLCALC 57 03/27/2020   LDLCALC 52 04/06/2019   Lab Results  Component Value Date   TSH 1.330 12/05/2018    Therapeutic Level Labs: No results found for: LITHIUM No results found for: CBMZ No results found for: VALPROATE  Current Medications: Current Outpatient Medications  Medication Sig Dispense Refill  . acetaminophen (TYLENOL) 325 MG tablet Take 650 mg by mouth every 6 (six) hours as needed for mild pain.    Marland Kitchen amLODipine (NORVASC) 10 MG tablet Take 1 tablet (10 mg total) by mouth daily. 90 tablet 3  . atorvastatin (LIPITOR) 80 MG tablet Take 0.5 tablets (40 mg total) by mouth daily at 6 PM. 45 tablet 3  . beta carotene 25000 UNIT capsule Take 25,000 Units by mouth daily.    . Blood Glucose Calibration (OT ULTRA/FASTTK CNTRL SOLN) SOLN Use with meter as directed Dx E11.9 1 each 1  . blood glucose meter kit and supplies Dispense based on patient and insurance preference. Use up to four times daily as directed. (FOR ICD-10 E10.9, E11.9). 1 each 1  . busPIRone (BUSPAR) 15 MG tablet 1  qam   2  qhs 90 tablet 5  . chlorthalidone (HYGROTEN) 15 MG tablet Take 1 tablet (15 mg total) by mouth daily. for blood pressure 90 tablet 3  . Cholecalciferol (VITAMIN D) 2000 UNITS CAPS Take 2 capsules by mouth daily.     Marland Kitchen FeFum-FePo-FA-B Cmp-C-Zn-Mn-Cu (SE-TAN PLUS) 162-115.2-1 MG CAPS Take 1 capsule by mouth 2 (two) times daily. 180 capsule 3  . Fiber POWD Take 1 Scoop by mouth daily as needed (bowel movement).     . fish oil-omega-3 fatty acids 1000 MG capsule Take 1 g by mouth 2 (two) times daily.     Marland Kitchen glimepiride (AMARYL) 4 MG tablet Take 0.5 tablets (2 mg total) by mouth 2 (two) times daily. 180 tablet 3  .  LORazepam (ATIVAN) 0.5 MG tablet 1  qam  1  Midday  2  qhs 120 tablet 2  . losartan (COZAAR) 100 MG tablet Take 1 tablet (100 mg total) by mouth daily. 90 tablet 3  . metFORMIN (GLUCOPHAGE) 500 MG tablet Take 1-2 tablets (500-1,000 mg total) by mouth See admin instructions. Take 2 tablets (1,091m) by mouth every morning and 1 tablet (5099m by mouth every evening. 270 tablet 3  . mirtazapine (REMERON SOL-TAB) 15 MG disintegrating tablet Take one 15 mg tablet each morning. 30 tablet 2  . mirtazapine (REMERON) 30 MG tablet 1  qhs 30 tablet 3  . Multiple Vitamins-Minerals (CENTRUM SILVER PO) Take 1 tablet by mouth daily.     . Marland Kitchenmeprazole (PRILOSEC) 20 MG capsule Take 1 capsule (20 mg total) by mouth 2 (two) times daily before a meal. TAKE ONE (1) CAPSULE EACH DAY (Patient taking differently: Take 20 mg by mouth 2 (two) times daily before a meal.) 180 capsule 1  . ONETOUCH ULTRA test strip USE UP TO 4 TIMES PER DAY 100 strip 12  . temazepam (RESTORIL) 15 MG capsule 2 qhs 60 capsule 2  . venlafaxine XR (EFFEXOR XR) 37.5 MG 24 hr capsule 2  qam 60 capsule 5   No current facility-administered medications for this visit.    Musculoskeletal: Strength & Muscle Tone: within normal limits Gait & Station: normal Patient leans: N/A  Psychiatric Specialty Exam:  Review of Systems  There were no vitals taken for this visit.There is no height or weight on file to calculate BMI.  General Appearance: Casual  Eye Contact:  Good  Speech:  Clear and Coherent  Volume:  Normal  Mood:  Depressed  Affect:  Appropriate  Thought Process:  Coherent  Orientation:  Full (Time, Place, and Person)  Thought Content:  WDL  Suicidal Thoughts:  No  Homicidal Thoughts:  No  Memory:  Negative  Judgement:  Good  Insight:  Good  Psychomotor Activity:  Normal  Concentration:    Recall:  Good  Fund of Knowledge:Fair  Language: Good  Akathisia:  No  Handed:  Right  AIMS (if indicated):  not done  Assets:  Desire for  Improvement  ADL's:  Intact  Cognition: WNL  Sleep:  Good   Screenings: GAD-7   Flowsheet Row Office Visit from 03/27/2020 in [place] Office Visit from 12/05/2019 in [place] Visit from 09/10/2019 in [place]  Total GAD-7 Score _0 Mini-Mental   [place] from 05/16/2017 in [place]  Total Score (max 30 points ) 29    PHQ2-9   [place] Visit from 07/28/2020 in [place] from 07/14/2020 in [place] Visit from 03/27/2020 in [place] Office Visit from 12/11/2019 in [place] Office Visit from 12/05/2019 in [place]  PHQ-2 Total Score _1 0 0  PHQ-9 Total Score -- -- 4 -- 1      Assessment and Plan:  1/11/20222:12 PM   This patient's first problem is that of major depression.  The patient has been taking Effexor 75 mg every morning for now 6 weeks.  I think he remains persistently afflicted in terms of depression.  I do think he has breaks where he feels pretty good for half a day a number of times during the week.  This is better than in the past but is still not optimal.  Today we'll go ahead and increase his Effexor to taking 150 mg.  He'll take two 75 mg pills.  The patient will also continue taking Remeron 15 mg in the morning and 30 mg at night.  The patient and his wife says there are times where he'll wake up in the morning and feel sluggish and tired and when he takes his Remeron 15 mg it'll just knock him out and will go back to sleep 4 hours.  I shared with him that from now when he wakes up feeling very tired and lethargic not to take the Remeron in the morning but to hold it.  I asked him to continue taking the Remeron 30 mg as at night.  I suspect the Remeron is having a positive effect with  his appetite.  His second disorder is that of insomnia.  At this time we'll go ahead and increase his Restoril to taking 30 mg at night.  He was taking 15.  His third problem is an adjustment disorder with an anxious mood state.  At this time we'll continue taking Ativan 0.5 mg 1 in the morning 1 midday and 2 at night.  I shared with him that this dose 2 mg is the maximum dose for his age group.  He also takes BuSpar 15 mg 1 in the morning and 2 at night.  Her  next intervention might be possible that if he feels no real anxiety that actually begin reducing his Ativan.  This is particularly the case if he feels energyless.  At this time I'm working hypothesis is he feels energyless because he is not getting a full night of sleep.  He states to his that when he took Restoril 30 mg he slept much better and I look forward to that happening again.  Lastly we will make an attempt to have him have contact with [facility].  This patient was to be seen again in this office in 6 weeks.

## 2020-07-31 ENCOUNTER — Telehealth (HOSPITAL_COMMUNITY): Payer: Self-pay | Admitting: *Deleted

## 2020-07-31 NOTE — Telephone Encounter (Signed)
Pt's wife called stating that she called to schedule an appointment with therapist Celesta Gentile but that the office requested $150 payment up front, over the phone. Wife says they absolutely do not give out their card number over the phone and office would not allow a check to be sent in not payment at the visit. So they are still interested in therapy but would like a referral.

## 2020-08-26 ENCOUNTER — Other Ambulatory Visit: Payer: Self-pay

## 2020-08-26 DIAGNOSIS — E291 Testicular hypofunction: Secondary | ICD-10-CM

## 2020-08-26 DIAGNOSIS — Z8546 Personal history of malignant neoplasm of prostate: Secondary | ICD-10-CM

## 2020-08-27 ENCOUNTER — Other Ambulatory Visit: Payer: Self-pay

## 2020-08-27 ENCOUNTER — Other Ambulatory Visit: Payer: Medicare Other

## 2020-08-27 DIAGNOSIS — Z7689 Persons encountering health services in other specified circumstances: Secondary | ICD-10-CM | POA: Diagnosis not present

## 2020-08-27 DIAGNOSIS — E291 Testicular hypofunction: Secondary | ICD-10-CM

## 2020-08-27 DIAGNOSIS — Z8546 Personal history of malignant neoplasm of prostate: Secondary | ICD-10-CM

## 2020-08-28 LAB — TESTOSTERONE: Testosterone: 31 ng/dL — ABNORMAL LOW (ref 264–916)

## 2020-08-28 LAB — PSA: Prostate Specific Ag, Serum: <0.1 ng/mL (ref 0.0–4.0)

## 2020-09-02 NOTE — Progress Notes (Signed)
Subjective:  1. Personal history of malignant neoplasm of prostate   2. Hypogonadism in male   3. History of nephrolithiasis   4. Male stress incontinence      George Wang is a 75 year-old male established patient who is here for his history of prostate cancer  His most recent PSA is <0.1 on 08/27/20.   His testosterone is 31.   George Wang returns in f/u for his history of prostate cancer treated in 09/2003 with radical prostatectomy. His PSA's have been undetectible. The PSA prior to this visit was <0.1. He is voiding well with minimal incontinence which has not changed. he wears a guard. He has some nocturia x 1-3. He has persistant ED. He is not on treatment. He has hypogonadism with a testosterone of 31 on 08/27/20 but didn't elect to have treatment. He has a history of stones but has had no hematuria or flank pain.   He had a CT AP in 2/21 that showed no stones.  He has no associated signs or symptoms.    IPSS    Row Name 09/04/20 1100         International Prostate Symptom Score   How often have you had the sensation of not emptying your bladder? Not at All     How often have you had to urinate less than every two hours? Less than 1 in 5 times     How often have you found you stopped and started again several times when you urinated? Not at All     How often have you found it difficult to postpone urination? Less than 1 in 5 times     How often have you had a weak urinary stream? Not at All     How often have you had to strain to start urination? Not at All     How many times did you typically get up at night to urinate? 1 Time     Total IPSS Score 3           Quality of Life due to urinary symptoms   If you were to spend the rest of your life with your urinary condition just the way it is now how would you feel about that? Pleased             ROS:  ROS:  A complete review of systems was performed.  All systems are negative except for pertinent findings as noted.   Review of  Systems    No Known Allergies  Outpatient Encounter Medications as of 09/04/2020  Medication Sig  . acetaminophen (TYLENOL) 325 MG tablet Take 650 mg by mouth every 6 (six) hours as needed for mild pain.  Marland Kitchen amLODipine (NORVASC) 10 MG tablet Take 1 tablet (10 mg total) by mouth daily.  Marland Kitchen atorvastatin (LIPITOR) 80 MG tablet Take 0.5 tablets (40 mg total) by mouth daily at 6 PM.  . beta carotene 25000 UNIT capsule Take 25,000 Units by mouth daily.  . Blood Glucose Calibration (OT ULTRA/FASTTK CNTRL SOLN) SOLN Use with meter as directed Dx E11.9  . blood glucose meter kit and supplies Dispense based on patient and insurance preference. Use up to four times daily as directed. (FOR ICD-10 E10.9, E11.9).  . busPIRone (BUSPAR) 15 MG tablet 1  qam   2  qhs  . chlorthalidone (HYGROTEN) 15 MG tablet Take 1 tablet (15 mg total) by mouth daily. for blood pressure  . chlorthalidone (HYGROTON) 25 MG tablet Take 25 mg  by mouth daily.  . Cholecalciferol (VITAMIN D) 2000 UNITS CAPS Take 2 capsules by mouth daily.   Marland Kitchen FeFum-FePo-FA-B Cmp-C-Zn-Mn-Cu (SE-TAN PLUS) 162-115.2-1 MG CAPS Take 1 capsule by mouth 2 (two) times daily.  . Fiber POWD Take 1 Scoop by mouth daily as needed (bowel movement).   . fish oil-omega-3 fatty acids 1000 MG capsule Take 1 g by mouth 2 (two) times daily.   Marland Kitchen glimepiride (AMARYL) 4 MG tablet Take 0.5 tablets (2 mg total) by mouth 2 (two) times daily.  Marland Kitchen LORazepam (ATIVAN) 0.5 MG tablet 1  qam  1  Midday  2  qhs  . LORazepam (ATIVAN) 0.5 MG tablet Take by mouth.  . losartan (COZAAR) 100 MG tablet Take 1 tablet (100 mg total) by mouth daily.  . metFORMIN (GLUCOPHAGE) 500 MG tablet Take 1-2 tablets (500-1,000 mg total) by mouth See admin instructions. Take 2 tablets (1,058m) by mouth every morning and 1 tablet (5025m by mouth every evening.  . mirtazapine (REMERON SOL-TAB) 15 MG disintegrating tablet Take one 15 mg tablet each morning.  . mirtazapine (REMERON) 15 MG tablet Take 15 mg  by mouth at bedtime.  . mirtazapine (REMERON) 30 MG tablet 1  qhs  . Multiple Vitamins-Minerals (CENTRUM SILVER PO) Take 1 tablet by mouth daily.   . Marland Kitchenmeprazole (PRILOSEC) 20 MG capsule Take 1 capsule (20 mg total) by mouth 2 (two) times daily before a meal. TAKE ONE (1) CAPSULE EACH DAY (Patient taking differently: Take 20 mg by mouth 2 (two) times daily before a meal.)  . ONETOUCH ULTRA test strip USE UP TO 4 TIMES PER DAY  . temazepam (RESTORIL) 15 MG capsule 2 qhs  . venlafaxine XR (EFFEXOR XR) 75 MG 24 hr capsule 2  qam  . [DISCONTINUED] febuxostat (ULORIC) 40 MG tablet Take 80 mg by mouth daily.  . [DISCONTINUED] simvastatin (ZOCOR) 80 MG tablet Take 80 mg by mouth at bedtime.   No facility-administered encounter medications on file as of 09/04/2020.    Past Medical History:  Diagnosis Date  . Allergy    mild  . Anemia    Iron deficiency  . Anxiety state, unspecified   . [place]  . Arthritis   . Calculus of kidney    kidney stones  - normal BMP   [facility]   Pro[facility], skin cancer x 2   . Cataract    forming both eyes  . Depressive disorder, not elsewhere classified   . Diverticulosis of colon (without mention of hemorrhage)   . GERD (gastroesophageal reflux disease)    on nexium  . Gout, unspecified   . Heart murmur   . Hyperlipidemia   . Hypertension, essential   . Irritable bowel syndrome   . Neuromuscular disorder (HCC)    numbness in knees from back issues   . Personal history of malignant neoplasm of prostate   . PVC (premature ventricular contraction)   . Type II or unspecified type diabetes mellitus without mention of complication, not stated as uncontrolled     Past Surgical History:  Procedure Laterality Date  . COLONOSCOPY    . HERNIA REPAIR     left side  . PROSTATE SURGERY    . skin cancer removed     chest x 2 3-4 yrs ago   . TONSILLECTOMY      Social History   Socioeconomic History  . Marital status: Married     Spouse name: George Wang. Number of children:  2  . Years of education: 32  . Highest education level: Some college, no degree  Occupational History  . Occupation: Production designer, theatre/television/film    Comment: Part time  Tobacco Use  . Smoking status: Former Smoker    Packs/day: 1.00    Years: 19.00    Pack years: 19.00    Types: Cigarettes    Quit date: 10/13/1978    Years since quitting: 41.9  . Smokeless tobacco: Former Systems developer    Types: Snuff    Quit date: 05/30/2008  Vaping Use  . Vaping Use: Never used  Substance and Sexual Activity  . Alcohol use: Not Currently  . Drug use: No  . Sexual activity: Yes  Other Topics Concern  . Not on file  Social History Narrative   Lives at home with wife.  He has one son and a daughter that died in a tornado in 11. 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 He has one living grandson and one grandson that passed away in 2016-09-20 at age 71 from cancer.   Social Determinants of Health   Financial Resource Strain: Not on file  Food Insecurity: Not on file  Transportation Needs: Not on file  Physical Activity: Not on file  Stress: Not on file  Social Connections: Not on file  Intimate Partner Violence: Not on file    Family History  Problem Relation Age of Onset  . Cancer Maternal Aunt        breast  . Pancreatic cancer Paternal Aunt   . Prostate cancer Paternal Uncle   . Heart disease Maternal Grandmother 69  . Heart disease Maternal Grandfather        fluid / CHF   . Heart disease Paternal Grandfather 20  . Heart attack Paternal Grandfather        passed at 64  . Prostate cancer Father   . Coronary artery disease Father 58       CABG  . Hypertension Mother   . Hyperlipidemia Mother   . Other Daughter        accident with tornado 09-20-1996  . Hypertension Son   . Colon cancer Neg Hx   . Colon polyps Neg Hx   . Esophageal cancer Neg Hx   . Rectal cancer Neg Hx   . Stomach cancer Neg Hx        Objective: There were no  vitals filed for this visit.   Physical Exam  Lab Results:  Results for orders placed or performed in visit on 09/04/20 (from the past 24 hour(s))  Urinalysis, Routine w reflex microscopic     Status: Abnormal   Collection Time: 09/04/20 10:52 AM  Result Value Ref Range   Specific Gravity, UA 1.020 1.005 - 1.030   pH, UA 5.5 5.0 - 7.5   Color, UA Yellow Yellow   Appearance Ur Clear Clear   Leukocytes,UA Negative Negative   Protein,UA Trace (A) Negative/Trace   Glucose, UA Negative Negative   Ketones, UA 1+ (A) Negative   RBC, UA Negative Negative   Bilirubin, UA Negative Negative   Urobilinogen, Ur 1.0 0.2 - 1.0 mg/dL   Nitrite, UA Negative Negative   Microscopic Examination Comment    Narrative   Performed at:  [facility] [address] [place], [place]  [id number] Lab Director: [place], Phone:  [phone]    BMET No results for input(s): NA, K, CL, CO2, GLUCOSE, BUN, CREATININE, CALCIUM in the last 72  hours. PSA PSA  Date Value Ref Range Status  06/25/2014 <0.1 0.0 - 4.0 ng/mL Final    Comment:    Roche ECLIA methodology. According to the American Urological Association, Serum PSA should decrease and remain at undetectable levels after radical prostatectomy. The AUA defines biochemical recurrence as an initial PSA value 0.2 ng/mL or greater followed by a subsequent confirmatory PSA value 0.2 ng/mL or greater. Values obtained with different assay methods or kits cannot be used interchangeably. Results cannot be interpreted as absolute evidence of the presence or absence of malignant disease.   06/12/2013 <0.1 0.0 - 4.0 ng/mL Final    Comment:    Roche ECLIA methodology. According to the American Urological Association, Serum PSA should decrease and remain at undetectable levels after radical prostatectomy. The AUA defines biochemical recurrence as an initial PSA value 0.2 ng/mL or greater followed by a subsequent confirmatory PSA value  0.2 ng/mL or greater. Values obtained with different assay methods or kits cannot be used interchangeably. Results cannot be interpreted as absolute evidence of the presence or absence of malignant disease.   Testosterone  Date Value Ref Range Status  08/27/2020 31 (L) 264 - 916 ng/dL Final    Comment:    Adult male reference interval is based on a population of healthy nonobese males (BMI <30) between 38 and 87 years old. [place], [place] [phone]. PMID: [id number].   05/30/2014 51 (L) 348 - 1,197 ng/dL Final  09/24/2013 47 (L) 348 - 1,197 ng/dL Final    Lab Results  Component Value Date   PSA1 <0.1 08/27/2020   PSA1 <0.1 08/11/2018   PSA1 <0.1 07/16/2016    UA is clear.  Studies/Results: CT report from 2021 reviewed.     Assessment & Plan: 1. History of prostate cancer.   His PSA remains undetectible.  He will return in a year with a PSA.  2. SUI.  He has stable mild leakage.   3. History of stones.  He has had no stone symptoms.  CT in 2/21 was clear.   4. Hypogonadism.   His T is only 31.  I discussed testosterone therapy which I think would be reasonable for him.  He is reluctant to consider therapy.  I will reach out to his PCP and cardiologist to get their input.   No orders of the defined types were placed in this encounter.    Orders Placed This Encounter  Procedures  . Urinalysis, Routine w reflex microscopic  . PSA    Standing Status:   Future    Standing Expiration Date:   09/04/2021  . Testosterone    Standing Status:   Future    Standing Expiration Date:   09/04/2021      Return in about 1 year (around 09/04/2021) for with labs..   CC: Dettinger, Fransisca Kaufmann, MD      Irine Seal 09/04/2020

## 2020-09-04 ENCOUNTER — Other Ambulatory Visit: Payer: Self-pay

## 2020-09-04 ENCOUNTER — Ambulatory Visit (INDEPENDENT_AMBULATORY_CARE_PROVIDER_SITE_OTHER): Payer: Medicare Other | Admitting: Urology

## 2020-09-04 VITALS — BP 128/71 | HR 91 | Temp 98.9°F | Ht 63.0 in | Wt 185.0 lb

## 2020-09-04 DIAGNOSIS — Z87442 Personal history of urinary calculi: Secondary | ICD-10-CM | POA: Diagnosis not present

## 2020-09-04 DIAGNOSIS — N393 Stress incontinence (female) (male): Secondary | ICD-10-CM | POA: Diagnosis not present

## 2020-09-04 DIAGNOSIS — E291 Testicular hypofunction: Secondary | ICD-10-CM | POA: Diagnosis not present

## 2020-09-04 DIAGNOSIS — Z8546 Personal history of malignant neoplasm of prostate: Secondary | ICD-10-CM

## 2020-09-04 LAB — URINALYSIS, ROUTINE W REFLEX MICROSCOPIC
Bilirubin, UA: NEGATIVE
Glucose, UA: NEGATIVE
Leukocytes,UA: NEGATIVE
Nitrite, UA: NEGATIVE
RBC, UA: NEGATIVE
Specific Gravity, UA: 1.02 (ref 1.005–1.030)
Urobilinogen, Ur: 1 mg/dL (ref 0.2–1.0)
pH, UA: 5.5 (ref 5.0–7.5)

## 2020-09-04 NOTE — Progress Notes (Signed)
Urological Symptom Review  Patient is experiencing the following symptoms: Get up at night to urinate Leakage of urine   Review of Systems  Gastrointestinal (upper)  : None   Gastrointestinal (lower) : Negative for lower GI symptoms  Constitutional : Negative for symptoms  Skin: Negative for skin symptoms  Eyes: Negative for eye symptoms  Ear/Nose/Throat : Negative for Ear/Nose/Throat symptoms  Hematologic/Lymphatic: Negative for Hematologic/Lymphatic symptoms  Cardiovascular : Negative for cardiovascular symptoms  Respiratory : Negative for respiratory symptoms  Endocrine: Negative for endocrine symptoms  Musculoskeletal: Back pain Joint pain  Neurological: Negative for neurological symptoms  Psychologic: Depression Anxiety

## 2020-09-05 ENCOUNTER — Ambulatory Visit: Payer: Medicare Other | Admitting: Urology

## 2020-09-15 ENCOUNTER — Other Ambulatory Visit: Payer: Self-pay | Admitting: Family Medicine

## 2020-09-16 ENCOUNTER — Ambulatory Visit (INDEPENDENT_AMBULATORY_CARE_PROVIDER_SITE_OTHER): Payer: Medicare Other | Admitting: Psychiatry

## 2020-09-16 ENCOUNTER — Other Ambulatory Visit: Payer: Self-pay

## 2020-09-16 DIAGNOSIS — F324 Major depressive disorder, single episode, in partial remission: Secondary | ICD-10-CM

## 2020-09-16 MED ORDER — BUSPIRONE HCL 15 MG PO TABS: ORAL_TABLET | ORAL | 5 refills | Status: DC

## 2020-09-16 MED ORDER — VENLAFAXINE HCL ER 75 MG PO CP24: ORAL_CAPSULE | ORAL | 5 refills | Status: DC

## 2020-09-16 MED ORDER — MIRTAZAPINE 30 MG PO TABS
ORAL_TABLET | ORAL | 3 refills | Status: DC
Start: 2020-09-16 — End: 2021-03-17

## 2020-09-16 MED ORDER — LORAZEPAM 0.5 MG PO TABS: ORAL_TABLET | ORAL | 2 refills | Status: DC

## 2020-09-16 MED ORDER — MIRTAZAPINE 15 MG PO TBDP
ORAL_TABLET | ORAL | 2 refills | Status: DC
Start: 2020-09-16 — End: 2020-12-17

## 2020-09-16 NOTE — Progress Notes (Signed)
Psychiatric Initial Adult Assessment   Patient Identification: George Wang MRN:  [id number] Date of Evaluation:  09/16/2020 Referral Source: Community/emergency room Chief Complaint:   Visit Diagnosis: Major depression  History of Present Illness:     Today the patient is seen with his wife [name].  The patient is better.  His wife will eventually say he is almost 100% better.  He no longer cries his anxiety level is well controlled.  He gets down and depressed for perhaps has no energy of a few days during the week.  2 days a week for 3 days a week he feels great and works with his son.  Then after 2 or 3 days he feels tired he crashes for just about a day or so.  It is not clear what crashes means but does not really get persistently depressed.  Generally he is sleeping well.  He goes to sleep without a problem and does not sleep as long as he might like to and used to be able to take rest and naps during the day but he says he cannot.  I suspect it is because he has enough sleep.  His appetite is good his energy level is reasonably good.  He is not having any problems thinking or concentrating.  He never drinks any alcohol or use any drugs.  The patient is thinking about retirement eventually.  Today we went over the risks of being older in terms of deaths of family members and friends.  We talked about a lot of different topics.  Unfortunately once again could not maintain a relationship with a therapist at [facility].  Yesterday had to give his credit card to pay the initial fee and he felt uncomfortable doing that so he canceled the appointment.  Interestingly by the end of the session he said he seems to be going back into having a relationship and be willing to go back to [facility].  I will make arrangements for him not to have to put a deposit down where they are waiting the initial fee.  Patient also agreed to change his Remeron to taking it all at night.  I think the patient is doing much  better. Depression Symptoms:  depressed mood, (Hypo) Manic Symptoms:   Anxiety Symptoms:  Excessive Worry, Psychotic Symptoms:   PTSD Symptoms:   Past Psychiatric History: 1 psychiatric hospitalization in 1989 presently on Lexapro 10 mg, BuSpar 30 mg  Previous Psychotropic Medications: Yes   Substance Abuse History in the last 12 months:  No.  Consequences of Substance Abuse:   Past Medical History:  Past Medical History:  Diagnosis Date  . Allergy    mild  . Anemia    Iron deficiency  . Anxiety state, unspecified   . Aortic atherosclerosis ([place])   . Arthritis   . Calculus of kidney    kidney stones  - normal BMP   . Cancer [facility])    Prostate, skin cancer x 2   . Cataract    forming both eyes  . Depressive disorder, not elsewhere classified   . Diverticulosis of colon (without mention of hemorrhage)   . GERD (gastroesophageal reflux disease)    on nexium  . Gout, unspecified   . Heart murmur   . Hyperlipidemia   . Hypertension, essential   . Irritable bowel syndrome   . Neuromuscular disorder (HCC)    numbness in knees from back issues   . Personal history of malignant neoplasm of prostate   .  PVC (premature ventricular contraction)   . Type II or unspecified type diabetes mellitus without mention of complication, not stated as uncontrolled     Past Surgical History:  Procedure Laterality Date  . COLONOSCOPY    . HERNIA REPAIR     left side  . PROSTATE SURGERY    . skin cancer removed     chest x 2 3-4 yrs ago   . TONSILLECTOMY      Family Psychiatric History:   Family History:  Family History  Problem Relation Age of Onset  . Cancer Maternal Aunt        breast  . Pancreatic cancer Paternal Aunt   . Prostate cancer Paternal Uncle   . Heart disease Maternal Grandmother 69  . Heart disease Maternal Grandfather        fluid / CHF   . Heart disease Paternal Grandfather 57  . Heart attack Paternal Grandfather        passed at 53  . Prostate cancer  Father   . Coronary artery disease Father 48       CABG  . Hypertension Mother   . Hyperlipidemia Mother   . Other Daughter        accident with tornado 10/18/1996  . Hypertension Son   . Colon cancer Neg Hx   . Colon polyps Neg Hx   . Esophageal cancer Neg Hx   . Rectal cancer Neg Hx   . Stomach cancer Neg Hx     Social History:   Social History   Socioeconomic History  . Marital status: Married    Spouse name: [name]  . Number of children: 2  . Years of education: 29  . Highest education level: Some college, no degree  Occupational History  . Occupation: Production designer, theatre/television/film    Comment: Part time  Tobacco Use  . Smoking status: Former Smoker    Packs/day: 1.00    Years: 19.00    Pack years: 19.00    Types: Cigarettes    Quit date: 10/13/1978    Years since quitting: 41.9  . Smokeless tobacco: Former Systems developer    Types: Snuff    Quit date: 05/30/2008  Vaping Use  . Vaping Use: Never used  Substance and Sexual Activity  . Alcohol use: Not Currently  . Drug use: No  . Sexual activity: Yes  Other Topics Concern  . Not on file  Social History Narrative   Lives at home with wife.  He has one son and a daughter that died in a tornado in 65. 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 He has one living grandson and one grandson that passed away in October 18, 2016 at age 13 from cancer.   Social Determinants of Health   Financial Resource Strain: Not on file  Food Insecurity: Not on file  Transportation Needs: Not on file  Physical Activity: Not on file  Stress: Not on file  Social Connections: Not on file    Additional Social History:   Allergies:  No Known Allergies  Metabolic Disorder Labs: Lab Results  Component Value Date   HGBA1C 6.9 07/28/2020   No results found for: PROLACTIN Lab Results  Component Value Date   CHOL 107 03/27/2020   TRIG 57 03/27/2020   HDL 37 (L) 03/27/2020   CHOLHDL 2.9 03/27/2020   LDLCALC 57 03/27/2020   LDLCALC 52 04/06/2019    Lab Results  Component Value Date   TSH 1.330 12/05/2018    Therapeutic Level  Labs: No results found for: LITHIUM No results found for: CBMZ No results found for: VALPROATE  Current Medications: Current Outpatient Medications  Medication Sig Dispense Refill  . chlorthalidone (HYGROTON) 25 MG tablet TAKE 1 TABLET DAILY FOR BLOOD PRESSURE 30 tablet 0  . acetaminophen (TYLENOL) 325 MG tablet Take 650 mg by mouth every 6 (six) hours as needed for mild pain.    Marland Kitchen amLODipine (NORVASC) 10 MG tablet Take 1 tablet (10 mg total) by mouth daily. 90 tablet 3  . atorvastatin (LIPITOR) 80 MG tablet Take 0.5 tablets (40 mg total) by mouth daily at 6 PM. 45 tablet 3  . beta carotene 25000 UNIT capsule Take 25,000 Units by mouth daily.    . Blood Glucose Calibration (OT ULTRA/FASTTK CNTRL SOLN) SOLN Use with meter as directed Dx E11.9 1 each 1  . blood glucose meter kit and supplies Dispense based on patient and insurance preference. Use up to four times daily as directed. (FOR ICD-10 E10.9, E11.9). 1 each 1  . busPIRone (BUSPAR) 15 MG tablet 1  qam   2  qhs 90 tablet 5  . chlorthalidone (HYGROTEN) 15 MG tablet Take 1 tablet (15 mg total) by mouth daily. for blood pressure 90 tablet 3  . Cholecalciferol (VITAMIN D) 2000 UNITS CAPS Take 2 capsules by mouth daily.     Marland Kitchen FeFum-FePo-FA-B Cmp-C-Zn-Mn-Cu (SE-TAN PLUS) 162-115.2-1 MG CAPS Take 1 capsule by mouth 2 (two) times daily. 180 capsule 3  . Fiber POWD Take 1 Scoop by mouth daily as needed (bowel movement).     . fish oil-omega-3 fatty acids 1000 MG capsule Take 1 g by mouth 2 (two) times daily.     Marland Kitchen glimepiride (AMARYL) 4 MG tablet Take 0.5 tablets (2 mg total) by mouth 2 (two) times daily. 180 tablet 3  . LORazepam (ATIVAN) 0.5 MG tablet Take by mouth.    Marland Kitchen LORazepam (ATIVAN) 0.5 MG tablet 1  qam  1  Midday  2  qhs 120 tablet 2  . losartan (COZAAR) 100 MG tablet Take 1 tablet (100 mg total) by mouth daily. 90 tablet 3  . metFORMIN (GLUCOPHAGE)  500 MG tablet Take 1-2 tablets (500-1,000 mg total) by mouth See admin instructions. Take 2 tablets (1,040m) by mouth every morning and 1 tablet (5033m by mouth every evening. 270 tablet 3  . mirtazapine (REMERON SOL-TAB) 15 MG disintegrating tablet Take one 15 mg tablet each morning. 30 tablet 2  . mirtazapine (REMERON) 15 MG tablet Take 15 mg by mouth at bedtime.    . mirtazapine (REMERON) 30 MG tablet 1  qhs 30 tablet 3  . Multiple Vitamins-Minerals (CENTRUM SILVER PO) Take 1 tablet by mouth daily.     . Marland Kitchenmeprazole (PRILOSEC) 20 MG capsule Take 1 capsule (20 mg total) by mouth 2 (two) times daily before a meal. TAKE ONE (1) CAPSULE EACH DAY (Patient taking differently: Take 20 mg by mouth 2 (two) times daily before a meal.) 180 capsule 1  . ONETOUCH ULTRA test strip USE UP TO 4 TIMES PER DAY 100 strip 12  . temazepam (RESTORIL) 15 MG capsule 2 qhs 60 capsule 2  . venlafaxine XR (EFFEXOR XR) 75 MG 24 hr capsule 2  qam 60 capsule 5   No current facility-administered medications for this visit.    Musculoskeletal: Strength & Muscle Tone: within normal limits Gait & Station: normal Patient leans: N/A  Psychiatric Specialty Exam: Review of Systems  There were no vitals taken for this visit.There is  no height or weight on file to calculate BMI.  General Appearance: Casual  Eye Contact:  Good  Speech:  Clear and Coherent  Volume:  Normal  Mood:  Depressed  Affect:  Appropriate  Thought Process:  Coherent  Orientation:  Full (Time, Place, and Person)  Thought Content:  WDL  Suicidal Thoughts:  No  Homicidal Thoughts:  No  Memory:  Negative  Judgement:  Good  Insight:  Good  Psychomotor Activity:  Normal  Concentration:    Recall:  Good  Fund of Knowledge:Fair  Language: Good  Akathisia:  No  Handed:  Right  AIMS (if indicated):  not done  Assets:  Desire for Improvement  ADL's:  Intact  Cognition: WNL  Sleep:  Good   Screenings: GAD-7   Flowsheet Row Office Visit from  03/27/2020 in [place] Office Visit from 12/05/2019 in [place] Visit from 09/10/2019 in [place]  Total GAD-7 Score 4 5 12     Mini-Mental   [place] from 05/16/2017 in [place]  Total Score (max 30 points ) 29    PHQ2-9   [place] Visit from 07/28/2020 in [place] from 07/14/2020 in [place] Visit from 03/27/2020 in [place] Office Visit from 12/11/2019 in [place] Office Visit from 12/05/2019 in [place]  PHQ-2 Total Score 2 2 2  0 0  PHQ-9 Total Score -- -- 4 -- 1      Assessment and Plan:  3/1/20223:12 PM   This patient has 3 problems.  His first problem is that of major depression which I think is much improved.  He takes 150 mg of Effexor.  He takes 30 mg of Remeron at 15 mg in the morning.  We will change it to taking all 45 mg at night.  His second problem is an adjustment disorder with an anxious mood state.  We will continue taking BuSpar 15 mg 1 in the morning and 2 at night.  At this time we will reduce his Ativan from taking 0.5 mg 4 a day to taking 1 in the morning 1 midday and 1 at night.  We will reduce the Ativan to just taking 1 at night.  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  Ultimately over the next few months we will attempt to completely take him off the Ativan.  His third problem is insomnia.  The patient is doing well taking 30 mg of Restoril.  This together with the 30 mg of Remeron seems to clearly help his sleep.  The patient is much improved.  He is asked me to make contact with [facility] to get try to engage with the therapy.  This patient will be seen again in 3 months.

## 2020-10-10 ENCOUNTER — Other Ambulatory Visit: Payer: Self-pay | Admitting: Family Medicine

## 2020-10-15 ENCOUNTER — Other Ambulatory Visit: Payer: Self-pay | Admitting: Family Medicine

## 2020-10-15 DIAGNOSIS — R140 Abdominal distension (gaseous): Secondary | ICD-10-CM

## 2020-11-20 ENCOUNTER — Other Ambulatory Visit: Payer: Self-pay | Admitting: Family Medicine

## 2020-11-20 NOTE — Telephone Encounter (Signed)
25mg  was d/c'd needs to be on 15mg 

## 2020-11-27 ENCOUNTER — Encounter: Payer: Self-pay | Admitting: Family Medicine

## 2020-11-27 ENCOUNTER — Other Ambulatory Visit: Payer: Self-pay

## 2020-11-27 ENCOUNTER — Ambulatory Visit (INDEPENDENT_AMBULATORY_CARE_PROVIDER_SITE_OTHER): Payer: Medicare Other | Admitting: Family Medicine

## 2020-11-27 VITALS — BP 129/66 | HR 108 | Ht 63.0 in | Wt 187.0 lb

## 2020-11-27 DIAGNOSIS — E782 Mixed hyperlipidemia: Secondary | ICD-10-CM | POA: Diagnosis not present

## 2020-11-27 DIAGNOSIS — E1169 Type 2 diabetes mellitus with other specified complication: Secondary | ICD-10-CM

## 2020-11-27 DIAGNOSIS — R140 Abdominal distension (gaseous): Secondary | ICD-10-CM

## 2020-11-27 DIAGNOSIS — I10 Essential (primary) hypertension: Secondary | ICD-10-CM | POA: Diagnosis not present

## 2020-11-27 DIAGNOSIS — R7989 Other specified abnormal findings of blood chemistry: Secondary | ICD-10-CM

## 2020-11-27 MED ORDER — OMEPRAZOLE 20 MG PO CPDR
20.0000 mg | DELAYED_RELEASE_CAPSULE | Freq: Every day | ORAL | 3 refills | Status: DC
Start: 2020-11-27 — End: 2021-11-03

## 2020-11-27 MED ORDER — CHLORTHALIDONE 25 MG PO TABS: 12.5000 mg | ORAL_TABLET | Freq: Every day | ORAL | 3 refills | Status: DC

## 2020-11-27 NOTE — Progress Notes (Signed)
BP 129/66   Pulse (!) 108   Ht 5' 3"  (1.6 m)   Wt 187 lb (84.8 kg)   SpO2 95%   BMI 33.13 kg/m    Subjective:   Patient ID: George Wang, male    DOB: 10-09-1945, 75 y.o.   MRN: [id number]  HPI: George Wang is a 75 y.o. male presenting on 11/27/2020 for Medical Management of Chronic Issues and Diabetes   HPI Type 2 diabetes mellitus Patient comes in today for recheck of his diabetes. 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 Patient is currently on an ACE inhibitor/ARB. Patient has not seen an ophthalmologist this year. Patient denies any issues with their feet. The symptom started onset as an adult hypertension hyperlipidemia ARE RELATED TO DM   Hypertension Patient is currently on amlodipine and losartan and chlorthalidone, and their blood pressure today is 129/66. Patient denies any lightheadedness or dizziness. Patient denies headaches, blurred vision, chest pains, shortness of breath, or weakness. Denies any side effects from medication and is content with current medication.   Hyperlipidemia Patient is coming in for recheck of his hyperlipidemia. The patient is currently taking fish oil and atorvastatin. They deny any issues with myalgias or history of liver damage from it. They deny any focal numbness or weakness or chest pain.   Relevant past medical, surgical, family and social history reviewed and updated as indicated. Interim medical history since our last visit reviewed. Allergies and medications reviewed and updated.  Review of Systems  Constitutional: Negative for chills and fever.  Eyes: Negative for visual disturbance.  Respiratory: Negative for shortness of breath and wheezing.   Cardiovascular: Negative for chest pain and leg swelling.  Musculoskeletal: Negative for back pain and gait problem.  Skin: Negative for rash.  Neurological: Negative for dizziness, weakness and  light-headedness.  All other systems reviewed and are negative.   Per HPI unless specifically indicated above   Allergies as of 11/27/2020   No Known Allergies     Medication List       Accurate as of Nov 27, 2020 11:01 AM. If you have any questions, ask your nurse or doctor.        acetaminophen 325 MG tablet Commonly known as: TYLENOL Take 650 mg by mouth every 6 (six) hours as needed for mild pain.   amLODipine 10 MG tablet Commonly known as: NORVASC Take 1 tablet (10 mg total) by mouth daily.   atorvastatin 80 MG tablet Commonly known as: LIPITOR Take 0.5 tablets (40 mg total) by mouth daily at 6 PM.   beta carotene 25000 UNIT capsule Take 25,000 Units by mouth daily.   blood glucose meter kit and supplies Dispense based on patient and insurance preference. Use up to four times daily as directed. (FOR ICD-10 E10.9, E11.9).   busPIRone 15 MG tablet Commonly known as: BUSPAR 1  qam   2  qhs   CENTRUM SILVER PO Take 1 tablet by mouth daily.   chlorthalidone 25 MG tablet Commonly known as: HYGROTON Take 0.5 tablets (12.5 mg total) by mouth daily. for blood pressure What changed:   how much to take  Another medication with the same name was removed. Continue taking this medication, and follow the directions you see here. Changed by: Fransisca Kaufmann Sabreen Kitchen, MD   Fiber Powd Take 1 Scoop by mouth daily as needed (bowel movement).   fish oil-omega-3 fatty acids 1000  MG capsule Take 1 g by mouth 2 (two) times daily.   glimepiride 4 MG tablet Commonly known as: AMARYL Take 0.5 tablets (2 mg total) by mouth 2 (two) times daily.   LORazepam 0.5 MG tablet Commonly known as: Ativan 1  qam  1  Midday  2  qhs What changed: Another medication with the same name was removed. Continue taking this medication, and follow the directions you see here. Changed by: Fransisca Kaufmann Britnie Colville, MD   losartan 100 MG tablet Commonly known as: COZAAR Take 1 tablet (100 mg total) by  mouth daily.   metFORMIN 500 MG tablet Commonly known as: GLUCOPHAGE Take 1-2 tablets (500-1,000 mg total) by mouth See admin instructions. Take 2 tablets (1,082m) by mouth every morning and 1 tablet (5028m by mouth every evening.   mirtazapine 30 MG tablet Commonly known as: Remeron 1  qhs What changed: Another medication with the same name was removed. Continue taking this medication, and follow the directions you see here. Changed by: JoFransisca Kaufmannettinger, MD   mirtazapine 15 MG disintegrating tablet Commonly known as: REMERON SOL-TAB Take one 15 mg tablet each morning. What changed: Another medication with the same name was removed. Continue taking this medication, and follow the directions you see here. Changed by: JoFransisca Kaufmannettinger, MD   omeprazole 20 MG capsule Commonly known as: PRILOSEC Take 1 capsule (20 mg total) by mouth daily. What changed: See the new instructions. Changed by: JoFransisca Kaufmannettinger, MD   OneTouch Ultra test strip Generic drug: glucose blood USE UP TO 4 TIMES PER DAY   OT ULTRA/FASTTK CNTRL SOLN Soln Use with meter as directed Dx E11.9   Se-Tan PLUS 162-115.2-1 MG Caps TAKE ONE CAPSULE BY MOUTH TWICE A DAY   temazepam 15 MG capsule Commonly known as: RESTORIL 2 qhs   venlafaxine XR 75 MG 24 hr capsule Commonly known as: Effexor XR 2  qam   Vitamin D 50 MCG (2000 UT) Caps Take 2 capsules by mouth daily.        Objective:   BP 129/66   Pulse (!) 108   Ht 5' 3"  (1.6 m)   Wt 187 lb (84.8 kg)   SpO2 95%   BMI 33.13 kg/m   Wt Readings from Last 3 Encounters:  11/27/20 187 lb (84.8 kg)  09/04/20 185 lb (83.9 kg)  07/28/20 184 lb (83.5 kg)    Physical Exam Vitals and nursing note reviewed.  Constitutional:      General: He is not in acute distress.    Appearance: He is well-developed. He is not diaphoretic.  Eyes:     General: No scleral icterus.    Conjunctiva/sclera: Conjunctivae normal.  Neck:     Thyroid: No thyromegaly.   Cardiovascular:     Rate and Rhythm: Normal rate and regular rhythm.     Heart sounds: Normal heart sounds. No murmur heard.   Pulmonary:     Effort: Pulmonary effort is normal. No respiratory distress.     Breath sounds: Normal breath sounds. No wheezing.  Musculoskeletal:        General: Normal range of motion.     Cervical back: Neck supple.  Lymphadenopathy:     Cervical: No cervical adenopathy.  Skin:    General: Skin is warm and dry.     Findings: No rash.  Neurological:     Mental Status: He is alert and oriented to person, place, and time.     Coordination: Coordination normal.  Psychiatric:        Behavior: Behavior normal.       Assessment & Plan:   Problem List Items Addressed This Visit      Cardiovascular and Mediastinum   Essential hypertension   Relevant Medications   chlorthalidone (HYGROTON) 25 MG tablet     Endocrine   Type 2 diabetes mellitus (HCC) - Primary   Relevant Orders   Bayer DCA Hb A1c Waived   Lipid panel     Other   Hyperlipemia   Relevant Medications   chlorthalidone (HYGROTON) 25 MG tablet   Other Relevant Orders   Bayer DCA Hb A1c Waived   Lipid panel    Other Visit Diagnoses    Bloating       Relevant Medications   omeprazole (PRILOSEC) 20 MG capsule   Low testosterone in male          We will keep the chlorthalidone at half a tablet because of lower blood pressures.  Continue other current medication.  Patient wants to come in tomorrow and do blood work tomorrow because he is not fasting today.  Patient had low testosterone we discussed risks and benefits and he still would like to hold off and think about it some more. Follow up plan: Return in about 4 months (around 03/30/2021), or if symptoms worsen or fail to improve, for Diabetes and hypertension.  Counseling provided for all of the vaccine components Orders Placed This Encounter  Procedures  . Bayer DCA Hb A1c Waived  . Lipid panel    Caryl Pina,  MD [facility] 11/27/2020, 11:01 AM

## 2020-11-28 ENCOUNTER — Other Ambulatory Visit: Payer: Medicare Other

## 2020-11-28 DIAGNOSIS — E782 Mixed hyperlipidemia: Secondary | ICD-10-CM

## 2020-11-28 DIAGNOSIS — E1169 Type 2 diabetes mellitus with other specified complication: Secondary | ICD-10-CM | POA: Diagnosis not present

## 2020-11-28 DIAGNOSIS — Z8546 Personal history of malignant neoplasm of prostate: Secondary | ICD-10-CM

## 2020-11-28 DIAGNOSIS — E291 Testicular hypofunction: Secondary | ICD-10-CM

## 2020-11-28 LAB — BAYER DCA HB A1C WAIVED: HB A1C (BAYER DCA - WAIVED): 7.2 % — ABNORMAL HIGH (ref <7.0)

## 2020-11-29 LAB — LIPID PANEL
Chol/HDL Ratio: 3.6 ratio (ref 0.0–5.0)
Cholesterol, Total: 122 mg/dL (ref 100–199)
HDL: 34 mg/dL — ABNORMAL LOW (ref >39)
LDL Chol Calc (NIH): 70 mg/dL (ref 0–99)
Triglycerides: 93 mg/dL (ref 0–149)
VLDL Cholesterol Cal: 18 mg/dL (ref 5–40)

## 2020-12-09 ENCOUNTER — Telehealth: Payer: Self-pay | Admitting: Family Medicine

## 2020-12-09 NOTE — Telephone Encounter (Signed)
Patient aware and verbalized understanding. °

## 2020-12-09 NOTE — Telephone Encounter (Signed)
Pt would like to discuss his lab results with a nurse

## 2020-12-17 ENCOUNTER — Ambulatory Visit (INDEPENDENT_AMBULATORY_CARE_PROVIDER_SITE_OTHER): Payer: Medicare Other | Admitting: Psychiatry

## 2020-12-17 ENCOUNTER — Other Ambulatory Visit: Payer: Self-pay

## 2020-12-17 DIAGNOSIS — F324 Major depressive disorder, single episode, in partial remission: Secondary | ICD-10-CM

## 2020-12-17 MED ORDER — BUSPIRONE HCL 15 MG PO TABS: ORAL_TABLET | ORAL | 5 refills | Status: DC

## 2020-12-17 MED ORDER — TEMAZEPAM 15 MG PO CAPS: ORAL_CAPSULE | ORAL | 2 refills | Status: DC

## 2020-12-17 MED ORDER — VENLAFAXINE HCL ER 75 MG PO CP24: ORAL_CAPSULE | ORAL | 5 refills | Status: DC

## 2020-12-17 MED ORDER — MIRTAZAPINE 15 MG PO TBDP: ORAL_TABLET | ORAL | 2 refills | Status: DC

## 2020-12-17 MED ORDER — LORAZEPAM 0.5 MG PO TABS: ORAL_TABLET | ORAL | 2 refills | Status: DC

## 2020-12-17 NOTE — Progress Notes (Signed)
Psychiatric Initial Adult Assessment   Patient Identification: George Wang MRN:  [id number] Date of Evaluation:  12/17/2020 Referral Source: Community/emergency room Chief Complaint:   Visit Diagnosis: Major depression  History of Present Illness:     Today I believe the patient is at his baseline.  Note is that he has not changed since his last visit a few months ago.  He says he has unexplained days where he feels depressed and feels like he cannot do anything and feels badly.  I believe it lasts only a few days and then the next part of the week he is doing well.  He does not know why he gets depressed.  Possible reasons and he will even admit that this is the issue of getting older.  This goes along the concept of retired.  He says he worked a little bit a few days ago and he said he felt good.  He also had the death of an elderly friend in the last month or 2.  I think a lot of the issues are related to his health.  And to his age.  He says that he likes working in the garden but has to bring guardianship of this is down because he gets so weak.  Then wants to rest a little bit to get back if he continues back.  In essence it is a combination of multiple factors.  He is not ready to retire.  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  In essence it is time to get it renewed and he has to be off of benzodiazepines.  Today I shared with him the way to do that is to reduce his Ativan to 0.5 mg twice daily for 2 weeks then 1 for a week and then discontinue it.  He is going to have some blood work done in July.  Patient is not suicidal.  Drinks no alcohol and uses no drugs.  I believe his anxiety is reasonably well controlled.  Today we made extensive arrangements for him to go to [facility] for psychotherapy.  I think this is the most important intervention. Depression Symptoms:  depressed mood, (Hypo) Manic  Symptoms:   Anxiety Symptoms:  Excessive Worry, Psychotic Symptoms:   PTSD Symptoms:   Past Psychiatric History: 1 psychiatric hospitalization in 1989 presently on Lexapro 10 mg, BuSpar 30 mg  Previous Psychotropic Medications: Yes   Substance Abuse History in the last 12 months:  No.  Consequences of Substance Abuse:   Past Medical History:  Past Medical History:  Diagnosis Date  . Allergy    mild  . Anemia    Iron deficiency  . Anxiety state, unspecified   . Aortic atherosclerosis ([place])   . Arthritis   . Calculus of kidney    kidney stones  - normal BMP   . Cancer [facility])    Prostate, skin cancer x 2   . Cataract    forming both eyes  . Depressive disorder, not elsewhere classified   . Diverticulosis of colon (without mention of hemorrhage)   . GERD (gastroesophageal reflux disease)    on nexium  . Gout, unspecified   . Heart murmur   . Hyperlipidemia   . Hypertension, essential   . Irritable bowel syndrome   . Neuromuscular disorder (HCC)    numbness in knees from back issues   .  Personal history of malignant neoplasm of prostate   . PVC (premature ventricular contraction)   . Type II or unspecified type diabetes mellitus without mention of complication, not stated as uncontrolled     Past Surgical History:  Procedure Laterality Date  . COLONOSCOPY    . HERNIA REPAIR     left side  . PROSTATE SURGERY    . skin cancer removed     chest x 2 3-4 yrs ago   . TONSILLECTOMY      Family Psychiatric History:   Family History:  Family History  Problem Relation Age of Onset  . Cancer Maternal Aunt        breast  . Pancreatic cancer Paternal Aunt   . Prostate cancer Paternal Uncle   . Heart disease Maternal Grandmother 69  . Heart disease Maternal Grandfather        fluid / CHF   . Heart disease Paternal Grandfather 41  . Heart attack Paternal Grandfather        passed at 53  . Prostate cancer Father   . Coronary artery disease Father 64       CABG  .  Hypertension Mother   . Hyperlipidemia Mother   . Other Daughter        accident with tornado 10-02-1996  . Hypertension Son   . Colon cancer Neg Hx   . Colon polyps Neg Hx   . Esophageal cancer Neg Hx   . Rectal cancer Neg Hx   . Stomach cancer Neg Hx     Social History:   Social History   Socioeconomic History  . Marital status: Married    Spouse name: [name]  . Number of children: 2  . Years of education: 51  . Highest education level: Some college, no degree  Occupational History  . Occupation: Production designer, theatre/television/film    Comment: Part time  Tobacco Use  . Smoking status: Former Smoker    Packs/day: 1.00    Years: 19.00    Pack years: 19.00    Types: Cigarettes    Quit date: 10/13/1978    Years since quitting: 42.2  . Smokeless tobacco: Former Systems developer    Types: Snuff    Quit date: 05/30/2008  Vaping Use  . Vaping Use: Never used  Substance and Sexual Activity  . Alcohol use: Not Currently  . Drug use: No  . Sexual activity: Yes  Other Topics Concern  . Not on file  Social History Narrative   Lives at home with wife.  He has one son and a daughter that died in a tornado in 16. 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 He has one living grandson and one grandson that passed away in 10/02/2016 at age 35 from cancer.   Social Determinants of Health   Financial Resource Strain: Not on file  Food Insecurity: Not on file  Transportation Needs: Not on file  Physical Activity: Not on file  Stress: Not on file  Social Connections: Not on file    Additional Social History:   Allergies:  No Known Allergies  Metabolic Disorder Labs: Lab Results  Component Value Date   HGBA1C 7.2 (H) 11/28/2020   No results found for: PROLACTIN Lab Results  Component Value Date   CHOL 122 11/28/2020   TRIG 93 11/28/2020   HDL 34 (L) 11/28/2020   CHOLHDL 3.6 11/28/2020   LDLCALC 70 11/28/2020   LDLCALC 57 03/27/2020   Lab Results  Component Value Date  TSH 1.330  12/05/2018    Therapeutic Level Labs: No results found for: LITHIUM No results found for: CBMZ No results found for: VALPROATE  Current Medications: Current Outpatient Medications  Medication Sig Dispense Refill  . acetaminophen (TYLENOL) 325 MG tablet Take 650 mg by mouth every 6 (six) hours as needed for mild pain.    Marland Kitchen amLODipine (NORVASC) 10 MG tablet Take 1 tablet (10 mg total) by mouth daily. 90 tablet 3  . atorvastatin (LIPITOR) 80 MG tablet Take 0.5 tablets (40 mg total) by mouth daily at 6 PM. 45 tablet 3  . beta carotene 25000 UNIT capsule Take 25,000 Units by mouth daily.    . Blood Glucose Calibration (OT ULTRA/FASTTK CNTRL SOLN) SOLN Use with meter as directed Dx E11.9 1 each 1  . blood glucose meter kit and supplies Dispense based on patient and insurance preference. Use up to four times daily as directed. (FOR ICD-10 E10.9, E11.9). 1 each 1  . busPIRone (BUSPAR) 15 MG tablet 1  qam   2  qhs 90 tablet 5  . chlorthalidone (HYGROTON) 25 MG tablet Take 0.5 tablets (12.5 mg total) by mouth daily. for blood pressure 45 tablet 3  . Cholecalciferol (VITAMIN D) 2000 UNITS CAPS Take 2 capsules by mouth daily.     Marland Kitchen FeFum-FePo-FA-B Cmp-C-Zn-Mn-Cu (SE-TAN PLUS) 162-115.2-1 MG CAPS TAKE ONE CAPSULE BY MOUTH TWICE A DAY 180 capsule 0  . Fiber POWD Take 1 Scoop by mouth daily as needed (bowel movement).     . fish oil-omega-3 fatty acids 1000 MG capsule Take 1 g by mouth 2 (two) times daily.     Marland Kitchen glimepiride (AMARYL) 4 MG tablet Take 0.5 tablets (2 mg total) by mouth 2 (two) times daily. 180 tablet 3  . LORazepam (ATIVAN) 0.5 MG tablet 1  qam  1  Midday  2  qhs 120 tablet 2  . losartan (COZAAR) 100 MG tablet Take 1 tablet (100 mg total) by mouth daily. 90 tablet 3  . metFORMIN (GLUCOPHAGE) 500 MG tablet Take 1-2 tablets (500-1,000 mg total) by mouth See admin instructions. Take 2 tablets (1,$RemoveBefo'000mg'WvzViGyEAFa$ ) by mouth every morning and 1 tablet ($RemoveB'500mg'fodoXtgq$ ) by mouth every evening. 270 tablet 3  .  mirtazapine (REMERON SOL-TAB) 15 MG disintegrating tablet Take one 15 mg tablet each morning. 30 tablet 2  . mirtazapine (REMERON) 30 MG tablet 1  qhs 30 tablet 3  . Multiple Vitamins-Minerals (CENTRUM SILVER PO) Take 1 tablet by mouth daily.     Marland Kitchen omeprazole (PRILOSEC) 20 MG capsule Take 1 capsule (20 mg total) by mouth daily. 90 capsule 3  . ONETOUCH ULTRA test strip USE UP TO 4 TIMES PER DAY 100 strip 12  . temazepam (RESTORIL) 15 MG capsule 2 qhs 60 capsule 2  . venlafaxine XR (EFFEXOR XR) 75 MG 24 hr capsule 2  qam 60 capsule 5   No current facility-administered medications for this visit.    Musculoskeletal: Strength & Muscle Tone: within normal limits Gait & Station: normal Patient leans: N/A  Psychiatric Specialty Exam: Review of Systems  There were no vitals taken for this visit.There is no height or weight on file to calculate BMI.  General Appearance: Casual  Eye Contact:  Good  Speech:  Clear and Coherent  Volume:  Normal  Mood:  Depressed  Affect:  Appropriate  Thought Process:  Coherent  Orientation:  Full (Time, Place, and Person)  Thought Content:  WDL  Suicidal Thoughts:  No  Homicidal Thoughts:  No  Memory:  Negative  Judgement:  Good  Insight:  Good  Psychomotor Activity:  Normal  Concentration:    Recall:  Good  Fund of [place]  Language: Good  Akathisia:  No  Handed:  Right  AIMS (if indicated):  not done  Assets:  Desire for Improvement  ADL's:  Intact  Cognition: WNL  Sleep:  Good   Screenings: GAD-7   Flowsheet Row Office Visit from 11/27/2020 in [place] Office Visit from 03/27/2020 in [place] Office Visit from 12/05/2019 in [place] Visit from 09/10/2019 in [place]  Total GAD-7 Score 6 4 5 12     Mini-Mental   [place] from 05/16/2017 in [place]  Total Score (max 30 points ) 29     PHQ2-9   [place] Visit from 11/27/2020 in [place] Visit from 07/28/2020 in [place] from 07/14/2020 in [place] Visit from 03/27/2020 in [place] Office Visit from 12/11/2019 in [place]  PHQ-2 Total Score 2 2 2 2  0  PHQ-9 Total Score 5 -- -- 4 --      Assessment and Plan:  6/1/20222:46 PM   This patient has 3 problems.  The first is major depression.  I think it is moderate in terms of severity at this time.  I think it is improved over the year.  He takes Effexor 150 mg.  His second problem is that of an adjustment disorder with an anxious mood state.  At this time we will reduce his Ativan to taking 0.5 mg twice daily for 2 weeks and 1 for 2 weeks and then discontinue it.  We will continue taking BuSpar 1 morning and 2 at night.  His third problem is that of insomnia.  He will continue taking Restoril 30 mg.  It should be noted that he is also taking Remeron 45 mg.  We will continue this agent.  At this time he was given the telephone number to call to register with [facility].  We have arranged so that he does not have to use his credit card to make the appointment.  The patient return to see me in 2 to 3 months.  I think at this time the patient is stable.  I think he is no better or no worse.

## 2021-01-12 ENCOUNTER — Other Ambulatory Visit: Payer: Self-pay | Admitting: Family Medicine

## 2021-02-11 ENCOUNTER — Ambulatory Visit (INDEPENDENT_AMBULATORY_CARE_PROVIDER_SITE_OTHER): Payer: Medicare Other | Admitting: Family Medicine

## 2021-02-11 ENCOUNTER — Other Ambulatory Visit: Payer: Self-pay

## 2021-02-11 ENCOUNTER — Encounter: Payer: Self-pay | Admitting: Family Medicine

## 2021-02-11 VITALS — BP 111/70 | HR 97 | Temp 97.6°F | Resp 20 | Ht 63.0 in | Wt 179.0 lb

## 2021-02-11 DIAGNOSIS — E1165 Type 2 diabetes mellitus with hyperglycemia: Secondary | ICD-10-CM | POA: Diagnosis not present

## 2021-02-11 MED ORDER — TRULICITY 0.75 MG/0.5ML ~~LOC~~ SOAJ: 0.7500 mg | SUBCUTANEOUS | 2 refills | Status: DC

## 2021-02-11 NOTE — Progress Notes (Signed)
Assessment & Plan:  1. Type 2 diabetes mellitus with hyperglycemia, without long-term current use of insulin ([place]) Discussed with patient that he needs better control of his diabetes. His A1c has been gradually increasing over the past year from 6.0 --> 6.2 --> 6.9 --> 7.2. I suspect his blood sugars have been fluctuating like this, but he was not aware as he was not checking his blood sugars at home previously. D/C'd glimepiride and started patient on Trulicity 6.76 mg once weekly. A sample from the office was given to get him started. He will see our clinical pharmacist for prescription assistance for Trulicity. A CCM referral was not placed as patient is already established. Education provided on diabetes and nutrition.  - Dulaglutide (TRULICITY) 1.95 KD/3.2IZ SOPN; Inject 0.75 mg into the skin once a week.  Dispense: 2 mL; Refill: 2   Follow up plan: Return ASAP, for DM with Almyra Free for prescription assistance.  Hendricks Limes, MSN, APRN, FNP-C [facility]  Subjective:   Patient ID: George Wang, male    DOB: October 06, 1945, 75 y.o.   MRN: [id number]  HPI: George Wang is a 75 y.o. male presenting on 02/11/2021 for Diabetes  Patient is here due to hyperglycemia. He was not previously checking his blood sugar at home. Recently he wasn't feeling well and decided to check his blood sugar. In doing so he realized it was high. Since then, he has been checking multiple times per day and gives the following readings: Saturday:  Fasting 111  Before lunch 254  Before dinner 222  Bedtime 174 Sunday:  Fasting 65  Before lunch 344  After lunch 214  6 PM 118  7 PM 108 Monday:  Fasting 100  Before lunch 159  4 PM 264  8 PM 98 Tuesday:  Fasting 75  Lunch 138  1:30 PM 191 This morning 317 11 AM 189 2:30 PM 230  Patient reports he is afraid to eat because he sugars have been getting so high. However, he did eat ice cream last night instead of food.   ROS:  Negative unless specifically indicated above in HPI.   Relevant past medical history reviewed and updated as indicated.   Allergies and medications reviewed and updated.   Current Outpatient Medications:    acetaminophen (TYLENOL) 325 MG tablet, Take 650 mg by mouth every 6 (six) hours as needed for mild pain., Disp: , Rfl:    amLODipine (NORVASC) 10 MG tablet, Take 1 tablet (10 mg total) by mouth daily., Disp: 90 tablet, Rfl: 3   atorvastatin (LIPITOR) 80 MG tablet, Take 0.5 tablets (40 mg total) by mouth daily at 6 PM., Disp: 45 tablet, Rfl: 3   beta carotene 25000 UNIT capsule, Take 25,000 Units by mouth daily., Disp: , Rfl:    Blood Glucose Calibration (OT ULTRA/FASTTK CNTRL SOLN) SOLN, Use with meter as directed Dx E11.9, Disp: 1 each, Rfl: 1   blood glucose meter kit and supplies, Dispense based on patient and insurance preference. Use up to four times daily as directed. (FOR ICD-10 E10.9, E11.9)., Disp: 1 each, Rfl: 1   busPIRone (BUSPAR) 15 MG tablet, 1  qam   2  qhs, Disp: 90 tablet, Rfl: 5   chlorthalidone (HYGROTON) 25 MG tablet, Take 0.5 tablets (12.5 mg total) by mouth daily. for blood pressure, Disp: 45 tablet, Rfl: 3   Cholecalciferol (VITAMIN D) 2000 UNITS CAPS, Take 2 capsules by mouth daily. , Disp: , Rfl:    FeFum-FePo-FA-B Cmp-C-Zn-Mn-Cu (  SE-TAN PLUS) 162-115.2-1 MG CAPS, TAKE ONE CAPSULE BY MOUTH TWICE A DAY, Disp: 180 capsule, Rfl: 0   Fiber POWD, Take 1 Scoop by mouth daily as needed (bowel movement). , Disp: , Rfl:    fish oil-omega-3 fatty acids 1000 MG capsule, Take 1 g by mouth 2 (two) times daily. , Disp: , Rfl:    glimepiride (AMARYL) 4 MG tablet, Take 0.5 tablets (2 mg total) by mouth 2 (two) times daily., Disp: 180 tablet, Rfl: 3   LORazepam (ATIVAN) 0.5 MG tablet, 1  qam  1  Midday  2  qhs, Disp: 120 tablet, Rfl: 2   losartan (COZAAR) 100 MG tablet, Take 1 tablet (100 mg total) by mouth daily., Disp: 90 tablet, Rfl: 3   metFORMIN (GLUCOPHAGE) 500 MG tablet, Take  1-2 tablets (500-1,000 mg total) by mouth See admin instructions. Take 2 tablets (1,088m) by mouth every morning and 1 tablet (5070m by mouth every evening., Disp: 270 tablet, Rfl: 3   mirtazapine (REMERON SOL-TAB) 15 MG disintegrating tablet, Take one 15 mg tablet each morning., Disp: 30 tablet, Rfl: 2   mirtazapine (REMERON) 30 MG tablet, 1  qhs, Disp: 30 tablet, Rfl: 3   Multiple Vitamins-Minerals (CENTRUM SILVER PO), Take 1 tablet by mouth daily. , Disp: , Rfl:    omeprazole (PRILOSEC) 20 MG capsule, Take 1 capsule (20 mg total) by mouth daily., Disp: 90 capsule, Rfl: 3   ONETOUCH ULTRA test strip, USE UP TO 4 TIMES PER DAY, Disp: 100 strip, Rfl: 12   temazepam (RESTORIL) 15 MG capsule, 2 qhs, Disp: 60 capsule, Rfl: 2   venlafaxine XR (EFFEXOR XR) 75 MG 24 hr capsule, 2  qam, Disp: 60 capsule, Rfl: 5  No Known Allergies  Objective:   BP 111/70   Pulse 97   Temp 97.6 F (36.4 C)   Resp 20   Ht _0  (1.6 m)   Wt 179 lb (81.2 kg)   SpO2 97%   BMI 31.71 kg/m    Physical Exam Vitals reviewed.  Constitutional:      General: He is not in acute distress.    Appearance: Normal appearance. He is not ill-appearing, toxic-appearing or diaphoretic.  HENT:     Head: Normocephalic and atraumatic.  Eyes:     General: No scleral icterus.       Right eye: No discharge.        Left eye: No discharge.     Conjunctiva/sclera: Conjunctivae normal.  Cardiovascular:     Rate and Rhythm: Normal rate.  Pulmonary:     Effort: Pulmonary effort is normal. No respiratory distress.  Musculoskeletal:        General: Normal range of motion.     Cervical back: Normal range of motion.  Skin:    General: Skin is warm and dry.  Neurological:     Mental Status: He is alert and oriented to person, place, and time. Mental status is at baseline.  Psychiatric:        Mood and Affect: Mood normal.        Behavior: Behavior normal.        Thought Content: Thought content normal.        Judgment:  Judgment normal.

## 2021-02-14 ENCOUNTER — Other Ambulatory Visit: Payer: Self-pay | Admitting: Family Medicine

## 2021-02-14 ENCOUNTER — Encounter: Payer: Self-pay | Admitting: Family Medicine

## 2021-02-19 DIAGNOSIS — H35362 Drusen (degenerative) of macula, left eye: Secondary | ICD-10-CM | POA: Diagnosis not present

## 2021-02-19 DIAGNOSIS — E119 Type 2 diabetes mellitus without complications: Secondary | ICD-10-CM | POA: Diagnosis not present

## 2021-02-19 DIAGNOSIS — H25813 Combined forms of age-related cataract, bilateral: Secondary | ICD-10-CM | POA: Diagnosis not present

## 2021-03-04 DIAGNOSIS — Z23 Encounter for immunization: Secondary | ICD-10-CM | POA: Diagnosis not present

## 2021-03-10 ENCOUNTER — Ambulatory Visit: Payer: Medicare Other | Admitting: Pharmacist

## 2021-03-12 ENCOUNTER — Ambulatory Visit (INDEPENDENT_AMBULATORY_CARE_PROVIDER_SITE_OTHER): Payer: Medicare Other | Admitting: Pharmacist

## 2021-03-12 ENCOUNTER — Other Ambulatory Visit: Payer: Self-pay

## 2021-03-12 DIAGNOSIS — E1165 Type 2 diabetes mellitus with hyperglycemia: Secondary | ICD-10-CM

## 2021-03-12 NOTE — Progress Notes (Signed)
Chronic Care Management Pharmacy Note  03/12/2021 Name:  George Wang MRN:  [id number] DOB:  05-06-1946  Summary: t2dm  Recommendations/Changes made from today's visit: Diabetes: Uncontrolled (A1C 7.2%)--RECENT HYPERGLYCEMIA; current treatment: METFORMIN (gfr 28), NEW START TRULICITY 8.27MB SQ WEEKLY;  Reports Bgs are better with 2 weeks of low dose trulicity Trulicity causing some nausea--instructed patient to eat smaller meals, low fat, increase water and give it a few more weeks We will keep trulicity low dose for now  Denies personal and family history of Medullary thyroid cancer (MTC) Will apply for patient assistance if patient can tolerate Pcp f/u on 8/67 Continue trulicity and metformin Current glucose readings: fasting glucose: <150, post prandial glucose: n/a Denies hypoglycemic/hyperglycemic symptoms Discussed meal planning options and Plate method for healthy eating Avoid sugary drinks and desserts Incorporate balanced protein, non starchy veggies, 1 serving of carbohydrate with each meal Increase water intake Increase physical activity as able Current exercise: n/a Educated on trulicity Assessed patient finances. Will apply for lilly cares patient assistance if patient continues on trulicity  Follow Up Plan: Telephone follow up appointment with care management team member scheduled for: 3 weeks  Subjective: AWESOME George Wang is an 75 y.o. year old male who is a primary patient of Dettinger, Fransisca Kaufmann, MD.  The CCM team was consulted for assistance with disease management and care coordination needs.    Engaged with patient face to face for initial visit in response to provider referral for pharmacy case management and/or care coordination services.   Consent to Services:  The patient was given information about Chronic Care Management services, agreed to services, and gave verbal consent prior to initiation of services.  Please see initial visit note for detailed  documentation.   Patient Care Team: Dettinger, Fransisca Kaufmann, MD as PCP - General (Family Medicine) Minus Breeding, MD as PCP - Cardiology (Cardiology) Druscilla Brownie, MD (Dermatology) Irine Seal, MD (Urology) Minus Breeding, MD (Cardiology) Pyrtle, Lajuan Lines, MD as Consulting Physician (Gastroenterology) Ilean China, RN as Registered Nurse Lavera Guise, [facility] as Pharmacist (Family Medicine)  Objective:  Lab Results  Component Value Date   CREATININE 0.96 07/28/2020   CREATININE 0.92 03/27/2020   CREATININE 1.06 12/05/2019    Lab Results  Component Value Date   HGBA1C 7.2 (H) 11/28/2020   Last diabetic Eye exam:  Lab Results  Component Value Date/Time   HMDIABEYEEXA No Retinopathy 02/19/2020 12:00 AM    Last diabetic Foot exam: No results found for: HMDIABFOOTEX      Component Value Date/Time   CHOL 122 11/28/2020 1000   CHOL 103 11/15/2012 1108   TRIG 93 11/28/2020 1000   TRIG 70 12/30/2016 1151   TRIG 50 11/15/2012 1108   HDL 34 (L) 11/28/2020 1000   HDL 34 (L) 12/30/2016 1151   HDL 37 (L) 11/15/2012 1108   CHOLHDL 3.6 11/28/2020 1000   LDLCALC 70 11/28/2020 1000   LDLCALC 61 01/29/2014 1135   [place] 56 11/15/2012 1108    Hepatic Function Latest Ref Rng & Units 03/27/2020 12/05/2019 09/16/2019  Total Protein 6.0 - 8.5 g/dL 6.5 6.0 6.9  Albumin 3.7 - 4.7 g/dL 4.3 4.2 4.3  AST 0 - 40 IU/L 50(H) 54(H) 43(H)  ALT 0 - 44 IU/L 17 15 18   Alk Phosphatase 48 - 121 IU/L 82 73 62  Total Bilirubin 0.0 - 1.2 mg/dL 0.5 0.6 1.1  Bilirubin, Direct 0.00 - 0.40 mg/dL - - -    Lab Results  Component  Value Date/Time   TSH 1.330 12/05/2018 10:41 AM   TSH 0.675 10/05/2017 11:41 AM    CBC Latest Ref Rng & Units 07/28/2020 03/27/2020 09/16/2019  WBC 3.4 - 10.8 x10E3/uL 10.2 6.2 7.9  Hemoglobin 13.0 - 17.7 g/dL 13.2 12.8(L) 12.8(L)  Hematocrit 37.5 - 51.0 % 38.6 37.4(L) 36.0(L)  Platelets 150 - 450 x10E3/uL 266 251 232    Lab Results  Component Value Date/Time   VD25OH  57.7 11/03/2018 03:15 PM   VD25OH 49.7 07/03/2018 11:10 AM    Clinical ASCVD: No  The ASCVD Risk score Mikey Bussing DC Jr., et al., 2013) failed to calculate for the following reasons:   The valid total cholesterol range is 130 to 320 mg/dL    Other: (CHADS2VASc if Afib, PHQ9 if depression, MMRC or CAT for COPD, ACT, DEXA)  Social History   Tobacco Use  Smoking Status Former   Packs/day: 1.00   Years: 19.00   Pack years: 19.00   Types: Cigarettes   Quit date: 10/13/1978   Years since quitting: 42.4  Smokeless Tobacco Former   Types: Snuff   Quit date: 05/30/2008   BP Readings from Last 3 Encounters:  02/11/21 111/70  11/27/20 129/66  09/04/20 128/71   Pulse Readings from Last 3 Encounters:  02/11/21 97  11/27/20 (!) 108  09/04/20 91   Wt Readings from Last 3 Encounters:  02/11/21 179 lb (81.2 kg)  11/27/20 187 lb (84.8 kg)  09/04/20 185 lb (83.9 kg)    Assessment: Review of patient past medical history, allergies, medications, health status, including review of consultants reports, laboratory and other test data, was performed as part of comprehensive evaluation and provision of chronic care management services.   SDOH:  (Social Determinants of Health) assessments and interventions performed:    CCM Care Plan  No Known Allergies  Medications Reviewed Today     Reviewed by Norma Fredrickson, MD (Physician) on 03/17/21 at 1514  Med List Status: <None>   Medication Order Taking? Sig Documenting Provider Last Dose Status Informant  acetaminophen (TYLENOL) 325 MG tablet [id number] No Take 650 mg by mouth every 6 (six) hours as needed for mild pain. [provider] Taking Active Self  amLODipine (NORVASC) 10 MG tablet [id number] No Take 1 tablet (10 mg total) by mouth daily. Dettinger, Fransisca Kaufmann, MD Taking Active   atorvastatin (LIPITOR) 80 MG tablet [id number] No Take 0.5 tablets (40 mg total) by mouth daily at 6 PM. Dettinger, Fransisca Kaufmann, MD Taking Active   beta carotene  25000 UNIT capsule [id number] No Take 25,000 Units by mouth daily. [provider] Taking Active Self  Blood Glucose Calibration (OT ULTRA/FASTTK CNTRL SOLN) SOLN [id number]  USE WITH METER AS DIRECTED DX E11.9 Dettinger, Fransisca Kaufmann, MD  Active   blood glucose meter kit and supplies [id number] No Dispense based on patient and insurance preference. Use up to four times daily as directed. (FOR ICD-10 E10.9, E11.9). Chipper Herb, MD Taking Active Self  busPIRone (BUSPAR) 15 MG tablet [id number]  1  qam   2  qhs Plovsky, Berneta Sages, MD  Active   chlorthalidone (HYGROTON) 25 MG tablet [id number] No Take 0.5 tablets (12.5 mg total) by mouth daily. for blood pressure Dettinger, Fransisca Kaufmann, MD Taking Active   Cholecalciferol (VITAMIN D) 2000 UNITS CAPS [id number] No Take 2 capsules by mouth daily.  [provider] Taking Active Self  Dulaglutide (TRULICITY) 9.23 RA/0.7MA SOPN [id number]  Inject 0.75 mg into the skin once a week. Loman Brooklyn,  FNP  Active   Discontinued 10/13/11 1130 (Error)   FeFum-FePo-FA-B Cmp-C-Zn-Mn-Cu (SE-TAN PLUS) 162-115.2-1 MG CAPS [id number] No TAKE ONE CAPSULE BY MOUTH TWICE A DAY Dettinger, Fransisca Kaufmann, MD Taking Active   Fiber POWD [id number] No Take 1 Scoop by mouth daily as needed (bowel movement).  [provider] Taking Active Self  fish oil-omega-3 fatty acids 1000 MG capsule [id number] No Take 1 g by mouth 2 (two) times daily.  [provider] Taking Active Self  LORazepam (ATIVAN) 0.5 MG tablet [id number]  1  qam  1  Midday  2  qhs Plovsky, Gerald, MD  Active   losartan (COZAAR) 100 MG tablet [id number] No Take 1 tablet (100 mg total) by mouth daily. Dettinger, Fransisca Kaufmann, MD Taking Active   metFORMIN (GLUCOPHAGE) 500 MG tablet [id number] No Take 1-2 tablets (500-1,000 mg total) by mouth See admin instructions. Take 2 tablets (1,028m) by mouth every morning and 1 tablet (5085m by mouth every evening. Dettinger, JoFransisca KaufmannMD Taking Active   mirtazapine  (REMERON SOL-TAB) 15 MG disintegrating tablet [id number] one 15 mg tablet each morning. PlNorma FredricksonMD  Active   mirtazapine (REMERON) 30 MG tablet [id number]  qhs Plovsky, GeBerneta SagesMD  Active   Multiple Vitamins-Minerals (CENTRUM SILVER PO) [id number] Take 1 tablet by mouth daily.  [provider] Taking Active Self  omeprazole (PRILOSEC) 20 MG capsule [id number] Take 1 capsule (20 mg total) by mouth daily. Dettinger, JoFransisca KaufmannMD Taking Active   ONOakland Surgicenter IncLTRA test strip [id number] USE UP TO 4 TIMES PER DAY Dettinger, JoFransisca KaufmannMD Taking Active   Discontinued 10/13/11 1131 (Error)   temazepam (RESTORIL) 15 MG capsule [id number] qhs Plovsky, GeBerneta SagesMD  Active   venlafaxine XR (EFFEXOR XR) 75 MG 24 hr capsule [id number]  qam PlNorma FredricksonMD  Active             Patient Active Problem List   Diagnosis Date Noted   Murmur, cardiac 04/05/2018   Thoracic aortic atherosclerosis (HCWells[place]10/22/2018   Morbid obesity (HCGib[place]05/11/2015   Essential hypertension 02/28/2013   Hyperlipemia 02/28/2013   Type 2 diabetes mellitus (HCC[place]08/13/2014   Vitamin D deficiency 02/28/2013   Overweight 10/13/2011   Frequent PVCs 10/13/2011   HEMOCCULT POSITIVE STOOL 08/14/2008   Iron deficiency anemia 06/27/2008   GOUT 04/25/2008   Anxiety state 04/25/2008   Depression 04/25/2008   DIVERTICULOSIS, COLON 04/25/2008   IRRITABLE BOWEL SYNDROME 04/25/2008   NEPHROLITHIASIS 04/25/2008   PROSTATE CANCER, HX OF 04/25/2008    Immunization History  Administered Date(s) Administered   Fluad Quad(high Dose 65+) 04/06/2019, 05/06/2020   H1N1 06/25/2008   Influenza, High Dose Seasonal PF 04/14/2016, 05/09/2017, 05/02/2018   Influenza,inj,Quad PF,6+ Mos 05/14/2013, 05/02/2014, 04/18/2015   Influenza-Unspecified 04/14/2009, 05/06/2010, 05/20/2011, 05/02/2012   Moderna Sars-Covid-2 Vaccination 08/30/2019, 09/28/2019, 06/04/2020   Pneumococcal Conjugate-13 06/19/2013, 05/02/2014    Pneumococcal Polysaccharide-23 01/15/2010   Td 05/20/2011   Tdap 05/20/2011   Zoster, Live 01/17/2007    Conditions to be addressed/monitored: DMII  Care Plan : PHARMD MEDICATION MANAGEMENT  Updates made by PrLavera GuiseRPHampton Manorince 03/19/2021 12:00 AM     Problem: DISEASE PROGRESSION PREVENTION      Long-Range Goal: T2DM   This Visit's Progress: Not on track  Priority: High  Note:   Current Barriers:  Unable to independently afford treatment regimen Unable to achieve control of T2DM  Suboptimal therapeutic regimen for T2DM  Pharmacist Clinical Goal(s):  Over the next 90 days, patient will verbalize ability to afford treatment regimen achieve control of T2DM as evidenced by IMPROVED GLYCEMIC CONTROL  through collaboration with PharmD and provider.    Interventions: 1:1 collaboration with Dettinger, Fransisca Kaufmann, MD regarding development and update of comprehensive plan of care as evidenced by provider attestation and co-signature Inter-disciplinary care team collaboration (see longitudinal plan of care) Comprehensive medication review performed; medication list updated in electronic medical record  Diabetes: Uncontrolled (A1C 7.2%)--RECENT HYPERGLYCEMIA; current treatment: METFORMIN (gfr 76), NEW START TRULICITY 2.16KO SQ WEEKLY;  Reports Bgs are better with 2 weeks of low dose trulicity Trulicity causing some nausea--instructed patient to eat smaller meals, low fat, increase water and give it a few more weeks We will keep trulicity low dose for now  Denies personal and family history of Medullary thyroid cancer (MTC) Will apply for patient assistance if patient can tolerate Pcp f/u on 4/69 Continue trulicity and metformin Current glucose readings: fasting glucose: <150, post prandial glucose: n/a Denies hypoglycemic/hyperglycemic symptoms Discussed meal planning options and Plate method for healthy eating Avoid sugary drinks and desserts Incorporate balanced protein, non  starchy veggies, 1 serving of carbohydrate with each meal Increase water intake Increase physical activity as able Current exercise: n/a Educated on trulicity Assessed patient finances. Will apply for lilly cares patient assistance if patient continues on trulicity  Patient Goals/Self-Care Activities Over the next 90 days, patient will:  - take medications as prescribed check glucose DAILY, document, and provide at future appointments collaborate with provider on medication access solutions  Follow Up Plan: Telephone follow up appointment with care management team member scheduled for: 3 weeks      Medication Assistance:  will apply if trulicity tolerated  Patient's preferred pharmacy is:  [place], [address] [place] [address] Phone: [phone] Fax: [phone]  [facility] [place], [place] [address] [place] [address] Phone: [phone] Fax: [phone]  [id number] [place]: [phone]: [phone]  Follow Up:  Patient agrees to Care Plan and Follow-up.  Plan: Telephone follow up appointment with care management team member scheduled for:  3 weeks  JRegina Eck PharmD, BCPS Clinical Pharmacist, [place] Phone [phone]

## 2021-03-17 ENCOUNTER — Ambulatory Visit (INDEPENDENT_AMBULATORY_CARE_PROVIDER_SITE_OTHER): Payer: Medicare Other | Admitting: Psychiatry

## 2021-03-17 ENCOUNTER — Other Ambulatory Visit: Payer: Self-pay

## 2021-03-17 DIAGNOSIS — F324 Major depressive disorder, single episode, in partial remission: Secondary | ICD-10-CM | POA: Diagnosis not present

## 2021-03-17 MED ORDER — LORAZEPAM 0.5 MG PO TABS: ORAL_TABLET | ORAL | 2 refills | Status: DC

## 2021-03-17 MED ORDER — VENLAFAXINE HCL ER 75 MG PO CP24: ORAL_CAPSULE | ORAL | 5 refills | Status: DC

## 2021-03-17 MED ORDER — MIRTAZAPINE 15 MG PO TBDP: ORAL_TABLET | ORAL | 2 refills | Status: DC

## 2021-03-17 MED ORDER — MIRTAZAPINE 30 MG PO TABS: ORAL_TABLET | ORAL | 3 refills | Status: DC

## 2021-03-17 MED ORDER — BUSPIRONE HCL 15 MG PO TABS: ORAL_TABLET | ORAL | 5 refills | Status: DC

## 2021-03-17 MED ORDER — TEMAZEPAM 15 MG PO CAPS: ORAL_CAPSULE | ORAL | 2 refills | Status: DC

## 2021-03-17 NOTE — Progress Notes (Signed)
Psychiatric Initial Adult Assessment   Patient Identification: George Wang MRN:  [id number] Date of Evaluation:  03/17/2021 Referral Source: Community/emergency room Chief Complaint:   Visit Diagnosis: Major depression  History of Present Illness:     Today we had an extensive meeting.  He was here with his wife.  Overall he has in fact improved over the last year or 2.  He is better.  In close evaluation he says that a lot of the mornings he wakes up and does not want to get out of bed.  However he says when he has something he knows he has to do he just ran out and has no problems.  The bottom line is when he has nothing planned to do is a hard time getting up to get out of bed because he has nothing he wants to do.  The patient says approximately 1 day out of the week he will feel really depressed but the other days as long as he has something he is doing is okay.  I do not interpret this as being persistent depression.  I do not interpret as being pervasive.  When he is working he feels great.  When he is doing something that is meaningful with his family he feels fine.  When he is not staying active and busy he has sometimes had a hard time getting out of bed but by lunchtime he is back to himself.  His wife agrees with this.  Overall he is sleeping and eating well.  He likes music he likes his 2 dogs and he likes watching TV.  His anxiety is relatively well controlled.  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  He says the Ativan is very helpful he takes it about 3 or 4 times a day.  The patient is functioning extremely well. Depression Symptoms:  depressed mood, (Hypo) Manic Symptoms:   Anxiety Symptoms:  Excessive Worry, Psychotic Symptoms:   PTSD Symptoms:   Past Psychiatric History: 1 psychiatric hospitalization in 1989 presently on Lexapro 10 mg, BuSpar 30  mg  Previous Psychotropic Medications: Yes   Substance Abuse History in the last 12 months:  No.  Consequences of Substance Abuse:   Past Medical History:  Past Medical History:  Diagnosis Date   Allergy    mild   Anemia    Iron deficiency   Anxiety state, unspecified    Aortic atherosclerosis ([place])    Arthritis    Calculus of kidney    kidney stones  - normal BMP    Cancer (HCC)    Prostate, skin cancer x 2    Cataract    forming both eyes   Depressive disorder, not elsewhere classified    Diverticulosis of colon (without mention of hemorrhage)    GERD (gastroesophageal reflux disease)    on nexium   Gout, unspecified    Heart murmur    Hyperlipidemia    Hypertension, essential    Irritable bowel syndrome    Neuromuscular disorder (HCC)    numbness in knees from back issues    Personal history of malignant neoplasm of prostate    PVC (premature ventricular contraction)    Type II or unspecified type diabetes mellitus without mention of complication, not stated as uncontrolled     Past Surgical History:  Procedure Laterality Date  COLONOSCOPY     HERNIA REPAIR     left side   PROSTATE SURGERY     skin cancer removed     chest x 2 3-4 yrs ago    TONSILLECTOMY      Family Psychiatric History:   Family History:  Family History  Problem Relation Age of Onset   Cancer Maternal Aunt        breast   Pancreatic cancer Paternal Aunt    Prostate cancer Paternal Uncle    Heart disease Maternal Grandmother 4   Heart disease Maternal Grandfather        fluid / CHF    Heart disease Paternal Grandfather 51   Heart attack Paternal Grandfather        passed at 66   Prostate cancer Father    Coronary artery disease Father 45       CABG   Hypertension Mother    Hyperlipidemia Mother    Other Daughter        accident with tornado 14-Aug-1996   Hypertension Son    Colon cancer Neg Hx    Colon polyps Neg Hx    Esophageal cancer Neg Hx    Rectal cancer Neg Hx     Stomach cancer Neg Hx     Social History:   Social History   Socioeconomic History   Marital status: Married    Spouse name: [name]   Number of children: 2   Years of education: 13   Highest education level: Some college, no degree  Occupational History   Occupation: Production designer, theatre/television/film    Comment: Part time  Tobacco Use   Smoking status: Former    Packs/day: 1.00    Years: 19.00    Pack years: 19.00    Types: Cigarettes    Quit date: 10/13/1978    Years since quitting: 42.4   Smokeless tobacco: Former    Types: Snuff    Quit date: 05/30/2008  Vaping Use   Vaping Use: Never used  Substance and Sexual Activity   Alcohol use: Not Currently   Drug use: No   Sexual activity: Yes  Other Topics Concern   Not on file  Social History Narrative   Lives at home with wife.  He has one son and a daughter that died in a tornado in 59. 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 He has one living grandson and one grandson that passed away in 08/14/16 at age 48 from cancer.   Social Determinants of Health   Financial Resource Strain: Not on file  Food Insecurity: Not on file  Transportation Needs: Not on file  Physical Activity: Not on file  Stress: Not on file  Social Connections: Not on file    Additional Social History:   Allergies:  No Known Allergies  Metabolic Disorder Labs: Lab Results  Component Value Date   HGBA1C 7.2 (H) 11/28/2020   No results found for: PROLACTIN Lab Results  Component Value Date   CHOL 122 11/28/2020   TRIG 93 11/28/2020   HDL 34 (L) 11/28/2020   CHOLHDL 3.6 11/28/2020   [place] 70 11/28/2020   LDLCALC 57 03/27/2020   Lab Results  Component Value Date   TSH 1.330 12/05/2018    Therapeutic Level Labs: No results found for: LITHIUM No results found for: CBMZ No results found for: VALPROATE  Current Medications: Current Outpatient Medications  Medication Sig Dispense Refill   acetaminophen (TYLENOL) 325 MG tablet  Take 650  mg by mouth every 6 (six) hours as needed for mild pain.     amLODipine (NORVASC) 10 MG tablet Take 1 tablet (10 mg total) by mouth daily. 90 tablet 3   atorvastatin (LIPITOR) 80 MG tablet Take 0.5 tablets (40 mg total) by mouth daily at 6 PM. 45 tablet 3   beta carotene 25000 UNIT capsule Take 25,000 Units by mouth daily.     Blood Glucose Calibration (OT ULTRA/FASTTK CNTRL SOLN) SOLN USE WITH METER AS DIRECTED DX E11.9 1 each 1   blood glucose meter kit and supplies Dispense based on patient and insurance preference. Use up to four times daily as directed. (FOR ICD-10 E10.9, E11.9). 1 each 1   busPIRone (BUSPAR) 15 MG tablet 1  qam   2  qhs 90 tablet 5   chlorthalidone (HYGROTON) 25 MG tablet Take 0.5 tablets (12.5 mg total) by mouth daily. for blood pressure 45 tablet 3   Cholecalciferol (VITAMIN D) 2000 UNITS CAPS Take 2 capsules by mouth daily.      Dulaglutide (TRULICITY) 0.75 MG/0.5ML SOPN Inject 0.75 mg into the skin once a week. 2 mL 2   FeFum-FePo-FA-B Cmp-C-Zn-Mn-Cu (SE-TAN PLUS) 162-115.2-1 MG CAPS TAKE ONE CAPSULE BY MOUTH TWICE A DAY 180 capsule 0   Fiber POWD Take 1 Scoop by mouth daily as needed (bowel movement).      fish oil-omega-3 fatty acids 1000 MG capsule Take 1 g by mouth 2 (two) times daily.      LORazepam (ATIVAN) 0.5 MG tablet 1  qam  1  Midday  2  qhs 120 tablet 2   losartan (COZAAR) 100 MG tablet Take 1 tablet (100 mg total) by mouth daily. 90 tablet 3   metFORMIN (GLUCOPHAGE) 500 MG tablet Take 1-2 tablets (500-1,000 mg total) by mouth See admin instructions. Take 2 tablets (1,000mg) by mouth every morning and 1 tablet (500mg) by mouth every evening. 270 tablet 3   mirtazapine (REMERON SOL-TAB) 15 MG disintegrating tablet Take one 15 mg tablet each morning. 30 tablet 2   mirtazapine (REMERON) 30 MG tablet 1  qhs 30 tablet 3   Multiple Vitamins-Minerals (CENTRUM SILVER PO) Take 1 tablet by mouth daily.      omeprazole (PRILOSEC) 20 MG capsule Take 1 capsule  (20 mg total) by mouth daily. 90 capsule 3   ONETOUCH ULTRA test strip USE UP TO 4 TIMES PER DAY 100 strip 12   temazepam (RESTORIL) 15 MG capsule 2 qhs 60 capsule 2   venlafaxine XR (EFFEXOR XR) 75 MG 24 hr capsule 3  qam 90 capsule 5   No current facility-administered medications for this visit.    Musculoskeletal: Strength & Muscle Tone: within normal limits Gait & Station: normal Patient leans: N/A  Psychiatric Specialty Exam: Review of Systems  There were no vitals taken for this visit.There is no height or weight on file to calculate BMI.  General Appearance: Casual  Eye Contact:  Good  Speech:  Clear and Coherent  Volume:  Normal  Mood:  Depressed  Affect:  Appropriate  Thought Process:  Coherent  Orientation:  Full (Time, Place, and Person)  Thought Content:  WDL  Suicidal Thoughts:  No  Homicidal Thoughts:  No  Memory:  Negative  Judgement:  Good  Insight:  Good  Psychomotor Activity:  Normal  Concentration:    Recall:  Good  Fund of Knowledge:Fair  Language: Good  Akathisia:  No  Handed:  Right  AIMS (if indicated):  not done    Assets:  Desire for Improvement  ADL's:  Intact  Cognition: WNL  Sleep:  Good   Screenings: GAD-7    Flowsheet Row Office Visit from 02/11/2021 in [place] Visit from 11/27/2020 in [place] Office Visit from 03/27/2020 in [place] Office Visit from 12/05/2019 in [place] Visit from 09/10/2019 in [place]  Total GAD-7 Score _0 Mini-Mental    [place] from 05/16/2017 in [place]  Total Score (max 30 points ) 29      PHQ2-9    [place] Visit from 02/11/2021 in [place] Office Visit from 11/27/2020 in [place] Office Visit from 07/28/2020 in [place] from 07/14/2020 in [place] Office Visit from 03/27/2020 in [place]  PHQ-2 Total Score _1 PHQ-9 Total Score 11 5 -- -- 4       Assessment and Plan:  8/30/20223:10 PM    This patient has 3 problems.  The first 1 is that of major depression.  The patient takes Remeron 15 mg in the morning and 30 mg at night.  Today on intervention will be to increase his Effexor to what I consider to be the highest dose for him.  He will increase it from taking 150 mg to a higher dose of 225 mg.  That will be her only intervention.  His second problem is an adjustment disorder with an anxious mood state.  She will continue taking Ativan as ordered and BuSpar as well.  His third problem problem is insomnia.  Patient will continue taking Restoril.  It should be noted the patient has started in psychotherapy at [facility].  He is going to have his third meeting in a week.  I think this is helpful for him.  This patient will return to see me in 2-1/2 months.  Overall I believe this patient over time is improved.  I think he is having a hard time accepting this might be the best he will be getting.  I think he has to do some psychological maneuvers to be able to enjoy life better.  Hopefully those maneuvers will be dealt with in therapy.

## 2021-03-19 NOTE — Patient Instructions (Signed)
Visit Information  PATIENT GOALS:  Goals Addressed               This Visit's Progress     Patient Stated     T2DM PHARMD (pt-stated)        Current Barriers:  Unable to independently afford treatment regimen Unable to achieve control of T2DM  Suboptimal therapeutic regimen for T2DM  Pharmacist Clinical Goal(s):  Over the next 90 days, patient will verbalize ability to afford treatment regimen achieve control of T2DM as evidenced by IMPROVED GLYCEMIC CONTROL through collaboration with PharmD and provider.    Interventions: 1:1 collaboration with Dettinger, Fransisca Kaufmann, MD regarding development and update of comprehensive plan of care as evidenced by provider attestation and co-signature Inter-disciplinary care team collaboration (see longitudinal plan of care) Comprehensive medication review performed; medication list updated in electronic medical record  Diabetes: Uncontrolled (A1C 7.2%)--RECENT HYPERGLYCEMIA; current treatment: METFORMIN (gfr 76), NEW START TRULICITY 0.'75MG'$  SQ WEEKLY;  Reports Bgs are better with 2 weeks of low dose trulicity Trulicity causing some nausea--instructed patient to eat smaller meals, low fat, increase water and give it a few more weeks We will keep trulicity low dose for now  Denies personal and family history of Medullary thyroid cancer (MTC) Will apply for patient assistance if patient can tolerate Pcp f/u on [id number] Continue trulicity and metformin Current glucose readings: fasting glucose: <150, post prandial glucose: n/a Denies hypoglycemic/hyperglycemic symptoms Discussed meal planning options and Plate method for healthy eating Avoid sugary drinks and desserts Incorporate balanced protein, non starchy veggies, 1 serving of carbohydrate with each meal Increase water intake Increase physical activity as able Current exercise: n/a Educated on trulicity Assessed patient finances. Will apply for [facility] patient assistance if patient  continues on trulicity  Patient Goals/Self-Care Activities Over the next 90 days, patient will:  - take medications as prescribed check glucose DAILY, document, and provide at future appointments collaborate with provider on medication access solutions  Follow Up Plan: Telephone follow up appointment with care management team member scheduled for: 3 weeks         The patient verbalized understanding of instructions, educational materials, and care plan provided today and declined offer to receive copy of patient instructions, educational materials, and care plan.   Telephone follow up appointment with care management team member scheduled for: 3 weeks  Regina Eck, PharmD, BCPS Clinical Pharmacist, [place]  II Phone [phone]

## 2021-03-31 ENCOUNTER — Ambulatory Visit (INDEPENDENT_AMBULATORY_CARE_PROVIDER_SITE_OTHER): Payer: Medicare Other | Admitting: Family Medicine

## 2021-03-31 ENCOUNTER — Other Ambulatory Visit: Payer: Self-pay

## 2021-03-31 ENCOUNTER — Encounter: Payer: Self-pay | Admitting: Family Medicine

## 2021-03-31 VITALS — BP 127/69 | HR 84 | Ht 63.0 in | Wt 173.0 lb

## 2021-03-31 DIAGNOSIS — I10 Essential (primary) hypertension: Secondary | ICD-10-CM | POA: Diagnosis not present

## 2021-03-31 DIAGNOSIS — E782 Mixed hyperlipidemia: Secondary | ICD-10-CM | POA: Diagnosis not present

## 2021-03-31 DIAGNOSIS — E1165 Type 2 diabetes mellitus with hyperglycemia: Secondary | ICD-10-CM

## 2021-03-31 LAB — BAYER DCA HB A1C WAIVED: HB A1C (BAYER DCA - WAIVED): 5.4 % (ref 4.8–5.6)

## 2021-03-31 MED ORDER — SE-TAN PLUS 162-115.2-1 MG PO CAPS: 1.0000 | ORAL_CAPSULE | Freq: Two times a day (BID) | ORAL | 3 refills | Status: DC

## 2021-03-31 NOTE — Progress Notes (Signed)
BP 127/69   Pulse 84   Ht _0  (1.6 m)   Wt 173 lb (78.5 kg)   SpO2 98%   BMI 30.65 kg/m    Subjective:   Patient ID: George Wang, male    DOB: [birth year], 75 y.o.   MRN: [id number]  HPI: George Wang is a 75 y.o. male presenting on 03/31/2021 for Medical Management of Chronic Issues and Diabetes   HPI Hypertension Patient is currently on amlodipine and chlorthalidone and losartan, and their blood pressure today is 127/69. Patient denies any lightheadedness or dizziness. Patient denies headaches, blurred vision, chest pains, shortness of breath, or weakness. Denies any side effects from medication and is content with current medication.   Type 2 diabetes mellitus Patient comes in today for recheck of his diabetes. Patient has been currently taking metformin and Trulicity, is having a lot of stomach issues with the Trulicity and it does not seem to be going away despite taking over the past few months.. Patient is currently on an ACE inhibitor/ARB. Patient has seen an ophthalmologist this year. Patient denies any issues with their feet. The symptom started onset as an adult hyperlipidemia and hypertension ARE RELATED TO DM   Hyperlipidemia Patient is coming in for recheck of his hyperlipidemia. The patient is currently taking atorvastatin and fish oil. They deny any issues with myalgias or history of liver damage from it. They deny any focal numbness or weakness or chest pain.   Relevant past medical, surgical, family and social history reviewed and updated as indicated. Interim medical history since our last visit reviewed. Allergies and medications reviewed and updated.  Review of Systems  Constitutional:  Negative for chills and fever.  Respiratory:  Negative for shortness of breath and wheezing.   Cardiovascular:  Negative for chest pain and leg swelling.  Musculoskeletal:  Negative for back pain and gait problem.  Skin:  Negative for rash.  Neurological:  Negative for  dizziness, weakness and light-headedness.  All other systems reviewed and are negative.  Per HPI unless specifically indicated above   Allergies as of 03/31/2021   No Known Allergies      Medication List        Accurate as of March 31, 2021 12:17 PM. If you have any questions, ask your nurse or doctor.          STOP taking these medications    Trulicity 8.18 MC/3.7VO Sopn Generic drug: Dulaglutide Stopped by: Fransisca Kaufmann Sira Adsit, MD       TAKE these medications    acetaminophen 325 MG tablet Commonly known as: TYLENOL Take 650 mg by mouth every 6 (six) hours as needed for mild pain.   amLODipine 10 MG tablet Commonly known as: NORVASC Take 1 tablet (10 mg total) by mouth daily.   atorvastatin 80 MG tablet Commonly known as: LIPITOR Take 0.5 tablets (40 mg total) by mouth daily at 6 PM.   beta carotene 25000 UNIT capsule Take 25,000 Units by mouth daily.   blood glucose meter kit and supplies Dispense based on patient and insurance preference. Use up to four times daily as directed. (FOR ICD-10 E10.9, E11.9).   busPIRone 15 MG tablet Commonly known as: BUSPAR 1  qam   2  qhs   CENTRUM SILVER PO Take 1 tablet by mouth daily.   chlorthalidone 25 MG tablet Commonly known as: HYGROTON Take 0.5 tablets (12.5 mg total) by mouth daily. for blood pressure   Fiber Powd Take 1 Scoop  by mouth daily as needed (bowel movement).   fish oil-omega-3 fatty acids 1000 MG capsule Take 1 g by mouth 2 (two) times daily.   LORazepam 0.5 MG tablet Commonly known as: Ativan 1  qam  1  Midday  2  qhs   losartan 100 MG tablet Commonly known as: COZAAR Take 1 tablet (100 mg total) by mouth daily.   metFORMIN 500 MG tablet Commonly known as: GLUCOPHAGE Take 1-2 tablets (500-1,000 mg total) by mouth See admin instructions. Take 2 tablets (1,056m) by mouth every morning and 1 tablet (5089m by mouth every evening.   mirtazapine 15 MG disintegrating tablet Commonly  known as: REMERON SOL-TAB Take one 15 mg tablet each morning.   mirtazapine 30 MG tablet Commonly known as: Remeron 1  qhs   omeprazole 20 MG capsule Commonly known as: PRILOSEC Take 1 capsule (20 mg total) by mouth daily.   OneTouch Ultra test strip Generic drug: glucose blood USE UP TO 4 TIMES PER DAY   OT ULTRA/FASTTK CNTRL SOLN Soln USE WITH METER AS DIRECTED DX E11.9   Se-Tan PLUS 162-115.2-1 MG Caps Take 1 capsule by mouth 2 (two) times daily.   temazepam 15 MG capsule Commonly known as: RESTORIL 2 qhs   venlafaxine XR 75 MG 24 hr capsule Commonly known as: Effexor XR 3  qam   Vitamin D 50 MCG (2000 UT) Caps Take 2 capsules by mouth daily.         Objective:   BP 127/69   Pulse 84   Ht _0  (1.6 m)   Wt 173 lb (78.5 kg)   SpO2 98%   BMI 30.65 kg/m   Wt Readings from Last 3 Encounters:  03/31/21 173 lb (78.5 kg)  02/11/21 179 lb (81.2 kg)  11/27/20 187 lb (84.8 kg)    Physical Exam Vitals and nursing note reviewed.  Constitutional:      General: He is not in acute distress.    Appearance: He is well-developed. He is not diaphoretic.  Eyes:     General: No scleral icterus.    Conjunctiva/sclera: Conjunctivae normal.  Neck:     Thyroid: No thyromegaly.  Cardiovascular:     Rate and Rhythm: Normal rate and regular rhythm.     Heart sounds: Normal heart sounds. No murmur heard. Pulmonary:     Effort: Pulmonary effort is normal. No respiratory distress.     Breath sounds: Normal breath sounds. No wheezing.  Musculoskeletal:        General: Normal range of motion.     Cervical back: Neck supple.  Lymphadenopathy:     Cervical: No cervical adenopathy.  Skin:    General: Skin is warm and dry.     Findings: No rash.  Neurological:     Mental Status: He is alert and oriented to person, place, and time.     Coordination: Coordination normal.  Psychiatric:        Behavior: Behavior normal.    Results for orders placed or performed in visit  on 03/31/21  Bayer DCA Hb A1c Waived  Result Value Ref Range   HB A1C (BAYER DCA - WAIVED) 5.4 4.8 - 5.6 %    Assessment & Plan:   Problem List Items Addressed This Visit       Cardiovascular and Mediastinum   Essential hypertension   Relevant Orders   CBC with Differential/Platelet   CMP14+EGFR   Lipid panel     Endocrine   Type 2 diabetes mellitus (  [place]) - Primary   Relevant Orders   Bayer DCA Hb A1c Waived (Completed)   CBC with Differential/Platelet   CMP14+EGFR   Lipid panel     Other   Hyperlipemia   Relevant Orders   CBC with Differential/Platelet   CMP14+EGFR   Lipid panel    Patient to discontinue the Trulicity because of side effects, we discussed other options in the future but he will keep track of his sugars over the next couple weeks and let us know how it does.  If we need to start any medicine he would prefer to try Rybelsus next, continue metformin  Blood pressure looks good Follow up plan: Return in about 4 months (around 07/31/2021), or if symptoms worsen or fail to improve, for Hypertension and diabetes and hyperlipidemia.  Counseling provided for all of the vaccine components Orders Placed This Encounter  Procedures   Bayer [place] Hb A1c Waived   CBC with Differential/Platelet   CMP14+EGFR   Lipid panel    Caryl Pina, MD [facility] 03/31/2021, 12:17 PM

## 2021-04-01 LAB — CMP14+EGFR
ALT: 17 IU/L (ref 0–44)
AST: 50 IU/L — ABNORMAL HIGH (ref 0–40)
Albumin/Globulin Ratio: 2.4 — ABNORMAL HIGH (ref 1.2–2.2)
Albumin: 4.5 g/dL (ref 3.7–4.7)
Alkaline Phosphatase: 75 IU/L (ref 44–121)
BUN/Creatinine Ratio: 18 (ref 10–24)
BUN: 19 mg/dL (ref 8–27)
Bilirubin Total: 0.5 mg/dL (ref 0.0–1.2)
CO2: 28 mmol/L (ref 20–29)
Calcium: 9.7 mg/dL (ref 8.6–10.2)
Chloride: 99 mmol/L (ref 96–106)
Creatinine, Ser: 1.07 mg/dL (ref 0.76–1.27)
Globulin, Total: 1.9 g/dL (ref 1.5–4.5)
Glucose: 101 mg/dL — ABNORMAL HIGH (ref 65–99)
Potassium: 4.2 mmol/L (ref 3.5–5.2)
Sodium: 143 mmol/L (ref 134–144)
Total Protein: 6.4 g/dL (ref 6.0–8.5)
eGFR: 71 mL/min/{1.73_m2} (ref >59)

## 2021-04-01 LAB — CBC WITH DIFFERENTIAL/PLATELET
Basophils Absolute: 0.1 10*3/uL (ref 0.0–0.2)
Basos: 1 %
EOS (ABSOLUTE): 0.2 10*3/uL (ref 0.0–0.4)
Eos: 2 %
Hematocrit: 36.7 % — ABNORMAL LOW (ref 37.5–51.0)
Hemoglobin: 12.6 g/dL — ABNORMAL LOW (ref 13.0–17.7)
Immature Grans (Abs): 0 10*3/uL (ref 0.0–0.1)
Immature Granulocytes: 0 %
Lymphocytes Absolute: 1.9 10*3/uL (ref 0.7–3.1)
Lymphs: 24 %
MCH: 34.1 pg — ABNORMAL HIGH (ref 26.6–33.0)
MCHC: 34.3 g/dL (ref 31.5–35.7)
MCV: 100 fL — ABNORMAL HIGH (ref 79–97)
Monocytes Absolute: 0.7 10*3/uL (ref 0.1–0.9)
Monocytes: 10 %
Neutrophils Absolute: 4.9 10*3/uL (ref 1.4–7.0)
Neutrophils: 63 %
Platelets: 269 10*3/uL (ref 150–450)
RBC: 3.69 x10E6/uL — ABNORMAL LOW (ref 4.14–5.80)
RDW: 12.3 % (ref 11.6–15.4)
WBC: 7.8 10*3/uL (ref 3.4–10.8)

## 2021-04-01 LAB — LIPID PANEL
Chol/HDL Ratio: 2.6 ratio (ref 0.0–5.0)
Cholesterol, Total: 97 mg/dL — ABNORMAL LOW (ref 100–199)
HDL: 37 mg/dL — ABNORMAL LOW (ref >39)
LDL Chol Calc (NIH): 45 mg/dL (ref 0–99)
Triglycerides: 67 mg/dL (ref 0–149)
VLDL Cholesterol Cal: 15 mg/dL (ref 5–40)

## 2021-04-08 ENCOUNTER — Ambulatory Visit (INDEPENDENT_AMBULATORY_CARE_PROVIDER_SITE_OTHER): Payer: Medicare Other | Admitting: Pharmacist

## 2021-04-08 DIAGNOSIS — E1165 Type 2 diabetes mellitus with hyperglycemia: Secondary | ICD-10-CM

## 2021-04-14 NOTE — Patient Instructions (Signed)
Visit Information  PATIENT GOALS:  Goals Addressed               This Visit's Progress     Patient Stated     T2DM PHARMD (pt-stated)        Current Barriers:  Unable to independently afford treatment regimen Unable to achieve control of T2DM  Suboptimal therapeutic regimen for T2DM  Pharmacist Clinical Goal(s):  Over the next 90 days, patient will verbalize ability to afford treatment regimen achieve control of T2DM as evidenced by IMPROVED GLYCEMIC CONTROL through collaboration with PharmD and provider.    Interventions: 1:1 collaboration with Dettinger, Fransisca Kaufmann, MD regarding development and update of comprehensive plan of care as evidenced by provider attestation and co-signature Inter-disciplinary care team collaboration (see longitudinal plan of care) Comprehensive medication review performed; medication list updated in electronic medical record  Diabetes: Uncontrolled (A1C 7.2%)--RECENT HYPERGLYCEMIA; current treatment: METFORMIN (gfr 79),   Reports Bgs are doing okay despite d/c of trulicity Trulicity was still causing some nausea--patient has discontinued  We will consider rybelsus in the next few weeks Denies personal and family history of Medullary thyroid cancer (MTC) Will apply for patient assistance if patient can tolerate Continue metformin  Current glucose readings: fasting glucose: <150, post prandial glucose: n/a Denies hypoglycemic/hyperglycemic symptoms Discussed meal planning options and Plate method for healthy eating Avoid sugary drinks and desserts Incorporate balanced protein, non starchy veggies, 1 serving of carbohydrate with each meal Increase water intake Increase physical activity as able Current exercise: n/a Educated on trulicity Assessed patient finances. Will apply for novo nordisk patient assistance if patient continues starts/tolerates Rybelsus  Patient Goals/Self-Care Activities Over the next 90 days, patient will:  - take  medications as prescribed check glucose DAILY, document, and provide at future appointments collaborate with provider on medication access solutions  Follow Up Plan: Telephone follow up appointment with care management team member scheduled for: 3 weeks         The patient verbalized understanding of instructions, educational materials, and care plan provided today and declined offer to receive copy of patient instructions, educational materials, and care plan.   Telephone follow up appointment with care management team member scheduled for: 3 weeks  Signature Regina Eck, PharmD, BCPS Clinical Pharmacist, [place]  II Phone [phone]

## 2021-04-14 NOTE — Progress Notes (Signed)
Chronic Care Management Pharmacy Note  04/08/2021 Name:  George Wang MRN:  [id number] DOB:  [birth year]  Summary: T2DM  Recommendations/Changes made from today's visit: Diabetes: Uncontrolled (A1C 5.4%)-- current treatment: METFORMIN (gfr 76)--a1c has improved Reports Bgs are doing okay despite d/c of trulicity Trulicity was still causing some nausea--patient has discontinued per PCP We will consider rybelsus in the next few weeks Denies personal and family history of Medullary thyroid cancer (MTC) Will apply for patient assistance if patient can tolerate Continue metformin  Current glucose readings: fasting glucose: <150, post prandial glucose: n/a Denies hypoglycemic/hyperglycemic symptoms Discussed meal planning options and Plate method for healthy eating Avoid sugary drinks and desserts Incorporate balanced protein, non starchy veggies, 1 serving of carbohydrate with each meal Increase water intake Increase physical activity as able Current exercise: n/a Educated on trulicity Assessed patient finances. Will apply for novo nordisk patient assistance if patient continues starts/tolerates Rybelsus  Follow Up Plan: Telephone follow up appointment with care management team member scheduled for: 3 weeks  Subjective: [name] is an 75 y.o. year old male who is a primary patient of Dettinger, Fransisca Kaufmann, MD.  The CCM team was consulted for assistance with disease management and care coordination needs.    Collaboration with PCP  for follow up visit in response to provider referral for pharmacy case management and/or care coordination services.   Consent to Services:  The patient was given information about Chronic Care Management services, agreed to services, and gave verbal consent prior to initiation of services.  Please see initial visit note for detailed documentation.   Patient Care Team: Dettinger, Fransisca Kaufmann, MD as PCP - General (Family Medicine) Minus Breeding,  MD as PCP - Cardiology (Cardiology) Druscilla Brownie, MD (Dermatology) Irine Seal, MD (Urology) Minus Breeding, MD (Cardiology) Pyrtle, Lajuan Lines, MD as Consulting Physician (Gastroenterology) Ilean China, RN as Registered Nurse Lavera Guise, [facility] as Pharmacist (Family Medicine)  Recent office visits: 9/13 PCP  Objective:  Lab Results  Component Value Date   CREATININE 1.07 03/31/2021   CREATININE 0.96 07/28/2020   CREATININE 0.92 03/27/2020    Lab Results  Component Value Date   HGBA1C 5.4 03/31/2021   Last diabetic Eye exam:  Lab Results  Component Value Date/Time   HMDIABEYEEXA No Retinopathy 02/19/2020 12:00 AM    Last diabetic Foot exam: No results found for: HMDIABFOOTEX      Component Value Date/Time   CHOL 97 (L) 03/31/2021 1541   CHOL 103 11/15/2012 1108   TRIG 67 03/31/2021 1541   TRIG 70 12/30/2016 1151   TRIG 50 11/15/2012 1108   HDL 37 (L) 03/31/2021 1541   HDL 34 (L) 12/30/2016 1151   HDL 37 (L) 11/15/2012 1108   CHOLHDL 2.6 03/31/2021 1541   [place] 45 03/31/2021 1541   [place] 61 01/29/2014 1135   [place] 56 11/15/2012 1108    Hepatic Function Latest Ref Rng & Units 03/31/2021 03/27/2020 12/05/2019  Total Protein 6.0 - 8.5 g/dL 6.4 6.5 6.0  Albumin 3.7 - 4.7 g/dL 4.5 4.3 4.2  AST 0 - 40 IU/L 50(H) 50(H) 54(H)  ALT 0 - 44 IU/L 17 17 15   Alk Phosphatase 44 - 121 IU/L 75 82 73  Total Bilirubin 0.0 - 1.2 mg/dL 0.5 0.5 0.6  Bilirubin, Direct 0.00 - 0.40 mg/dL - - -    Lab Results  Component Value Date/Time   TSH 1.330 12/05/2018 10:41 AM   TSH 0.675 10/05/2017 11:41 AM  CBC Latest Ref Rng & Units 03/31/2021 07/28/2020 03/27/2020  WBC 3.4 - 10.8 x10E3/uL 7.8 10.2 6.2  Hemoglobin 13.0 - 17.7 g/dL 12.6(L) 13.2 12.8(L)  Hematocrit 37.5 - 51.0 % 36.7(L) 38.6 37.4(L)  Platelets 150 - 450 x10E3/uL 269 266 251    Lab Results  Component Value Date/Time   VD25OH 57.7 11/03/2018 03:15 PM   VD25OH 49.7 07/03/2018 11:10 AM    Clinical ASCVD: No   The ASCVD Risk score (Arnett DK, et al., 2019) failed to calculate for the following reasons:   The valid total cholesterol range is 130 to 320 mg/dL    Other: (CHADS2VASc if Afib, PHQ9 if depression, MMRC or CAT for COPD, ACT, DEXA)  Social History   Tobacco Use  Smoking Status Former   Packs/day: 1.00   Years: 19.00   Pack years: 19.00   Types: Cigarettes   Quit date: 10/13/1978   Years since quitting: 42.5  Smokeless Tobacco Former   Types: Snuff   Quit date: 05/30/2008   BP Readings from Last 3 Encounters:  03/31/21 127/69  02/11/21 111/70  11/27/20 129/66   Pulse Readings from Last 3 Encounters:  03/31/21 84  02/11/21 97  11/27/20 (!) 108   Wt Readings from Last 3 Encounters:  03/31/21 173 lb (78.5 kg)  02/11/21 179 lb (81.2 kg)  11/27/20 187 lb (84.8 kg)    Assessment: Review of patient past medical history, allergies, medications, health status, including review of consultants reports, laboratory and other test data, was performed as part of comprehensive evaluation and provision of chronic care management services.   SDOH:  (Social Determinants of Health) assessments and interventions performed:    CCM Care Plan  No Known Allergies  Medications Reviewed Today     Reviewed by Lavera Guise, [facility] (Pharmacist) on 04/14/21 at 1  Med List Status: <None>   Medication Order Taking? Sig Documenting Provider Last Dose Status Informant  acetaminophen (TYLENOL) 325 MG tablet [id number] No Take 650 mg by mouth every 6 (six) hours as needed for mild pain. [provider] Taking Active Self  amLODipine (NORVASC) 10 MG tablet [id number] No Take 1 tablet (10 mg total) by mouth daily. Dettinger, Fransisca Kaufmann, MD Taking Active   atorvastatin (LIPITOR) 80 MG tablet [id number] No Take 0.5 tablets (40 mg total) by mouth daily at 6 PM. Dettinger, Fransisca Kaufmann, MD Taking Active   beta carotene 25000 UNIT capsule [id number] No Take 25,000 Units by mouth daily. [provider] Taking Active Self  Blood Glucose Calibration (OT ULTRA/FASTTK CNTRL SOLN) SOLN [id number] No USE WITH METER AS DIRECTED DX E11.9 Dettinger, Fransisca Kaufmann, MD Taking Active   blood glucose meter kit and supplies [id number] No Dispense based on patient and insurance preference. Use up to four times daily as directed. (FOR ICD-10 E10.9, E11.9). Chipper Herb, MD Taking Active Self  busPIRone (BUSPAR) 15 MG tablet [id number] No 1  qam   2  qhs Plovsky, Berneta Sages, MD Taking Active   chlorthalidone (HYGROTON) 25 MG tablet [id number] No Take 0.5 tablets (12.5 mg total) by mouth daily. for blood pressure Dettinger, Fransisca Kaufmann, MD Taking Active   Cholecalciferol (VITAMIN D) 2000 UNITS CAPS [id number] No Take 2 capsules by mouth daily.  [provider] Taking Active Self  Discontinued 10/13/11 1130 (Error)   FeFum-FePo-FA-B Cmp-C-Zn-Mn-Cu (SE-TAN PLUS) 162-115.2-1 MG CAPS [id number]  Take 1 capsule by mouth 2 (two) times daily. Dettinger, Fransisca Kaufmann, MD  Active   Fiber POWD [id number] No Take 1  Scoop by mouth daily as needed (bowel movement).  [provider] Taking Active Self  fish oil-omega-3 fatty acids 1000 MG capsule [id number] No Take 1 g by mouth 2 (two) times daily.  [provider] Taking Active Self  LORazepam (ATIVAN) 0.5 MG tablet [id number] No 1  qam  1  Midday  2  qhs Plovsky, Gerald, MD Taking Active   losartan (COZAAR) 100 MG tablet [id number] No Take 1 tablet (100 mg total) by mouth daily. Dettinger, Fransisca Kaufmann, MD Taking Active   metFORMIN (GLUCOPHAGE) 500 MG tablet [id number] No Take 1-2 tablets (500-1,000 mg total) by mouth See admin instructions. Take 2 tablets (1,07m) by mouth every morning and 1 tablet (5039m by mouth every evening. Dettinger, JoFransisca KaufmannMD Taking Active   mirtazapine (REMERON SOL-TAB) 15 MG disintegrating tablet [id number] Take one 15 mg tablet each morning. PlNorma FredricksonMD Taking Active   mirtazapine (REMERON) 30 MG tablet [id number] 1  qhs  Plovsky, GeBerneta SagesMD Taking Active   Multiple Vitamins-Minerals (CENTRUM SILVER PO) [id number] Take 1 tablet by mouth daily.  [provider] Taking Active Self  omeprazole (PRILOSEC) 20 MG capsule [id number] Take 1 capsule (20 mg total) by mouth daily. Dettinger, JoFransisca KaufmannMD Taking Active   ONLake'S Crossing CenterLTRA test strip [id number] USE UP TO 4 TIMES PER DAY Dettinger, JoFransisca KaufmannMD Taking Active   Discontinued 10/13/11 1131 (Error)   temazepam (RESTORIL) 15 MG capsule [id number] 2 qhs Plovsky, GeBerneta SagesMD Taking Active   venlafaxine XR (EFFEXOR XR) 75 MG 24 hr capsule [id number] 3  qam PlNorma FredricksonMD Taking Active             Patient Active Problem List   Diagnosis Date Noted   Murmur, cardiac 04/05/2018   Thoracic aortic atherosclerosis ([facility]10/22/2018   Morbid obesity ([facility]05/11/2015   Essential hypertension 02/28/2013   Hyperlipemia 02/28/2013   Type 2 diabetes mellitus ([place]08/13/2014   Vitamin D deficiency 02/28/2013   Overweight 10/13/2011   Frequent PVCs 10/13/2011   HEMOCCULT POSITIVE STOOL 08/14/2008   Iron deficiency anemia 06/27/2008   GOUT 04/25/2008   Anxiety state 04/25/2008   Depression 04/25/2008   DIVERTICULOSIS, COLON 04/25/2008   IRRITABLE BOWEL SYNDROME 04/25/2008   NEPHROLITHIASIS 04/25/2008   PROSTATE CANCER, HX OF 04/25/2008    Immunization History  Administered Date(s) Administered   Fluad Quad(high Dose 65+) 04/06/2019, 05/06/2020   H1N1 06/25/2008   Influenza, High Dose Seasonal PF 04/14/2016, 05/09/2017, 05/02/2018   Influenza,inj,Quad PF,6+ Mos 05/14/2013, 05/02/2014, 04/18/2015   Influenza-Unspecified 04/14/2009, 05/06/2010, 05/20/2011, 05/02/2012   Moderna Sars-Covid-2 Vaccination 08/30/2019, 09/28/2019, 06/04/2020   Pneumococcal Conjugate-13 06/19/2013, 05/02/2014   Pneumococcal Polysaccharide-23 01/15/2010   Td 05/20/2011   Tdap 05/20/2011   Zoster, Live 01/17/2007    Conditions to be  addressed/monitored: DMII  Care Plan : PHARMD MEDICATION MANAGEMENT  Updates made by PrLavera GuiseRPAlum Creekince 04/14/2021 12:00 AM     Problem: DISEASE PROGRESSION PREVENTION      Long-Range Goal: T2DM   Recent Progress: Not on track  Priority: High  Note:   Current Barriers:  Unable to independently afford treatment regimen Unable to achieve control of T2DM  Suboptimal therapeutic regimen for T2DM  Pharmacist Clinical Goal(s):  Over the next 90 days, patient will verbalize ability to afford treatment regimen achieve control of T2DM as evidenced by IMPROVED GLYCEMIC CONTROL  through collaboration with PharmD and provider.    Interventions: 1:1 collaboration with De[place] control surveyorJoVonna Kotyk  A, MD regarding development and update of comprehensive plan of care as evidenced by provider attestation and co-signature Inter-disciplinary care team collaboration (see longitudinal plan of care) Comprehensive medication review performed; medication list updated in electronic medical record  Diabetes: Uncontrolled (A1C 7.2%)--RECENT HYPERGLYCEMIA; current treatment: METFORMIN (gfr 21),   Reports Bgs are doing okay despite d/c of trulicity Trulicity was still causing some nausea--patient has discontinued  We will consider rybelsus in the next few weeks Denies personal and family history of Medullary thyroid cancer (MTC) Will apply for patient assistance if patient can tolerate Continue metformin  Current glucose readings: fasting glucose: <150, post prandial glucose: n/a Denies hypoglycemic/hyperglycemic symptoms Discussed meal planning options and Plate method for healthy eating Avoid sugary drinks and desserts Incorporate balanced protein, non starchy veggies, 1 serving of carbohydrate with each meal Increase water intake Increase physical activity as able Current exercise: n/a Educated on trulicity Assessed patient finances. Will apply for novo nordisk patient assistance if patient continues  starts/tolerates Rybelsus  Patient Goals/Self-Care Activities Over the next 90 days, patient will:  - take medications as prescribed check glucose DAILY, document, and provide at future appointments collaborate with provider on medication access solutions  Follow Up Plan: Telephone follow up appointment with care management team member scheduled for: 3 weeks      Medication Assistance: None required.  Patient affirms current coverage meets needs.  Patient's preferred pharmacy is:  [facility] [place] [address] Phone: [phone] Fax: [phone]  [facility] [place], [place] [address] [place] [address] Phone: [phone] Fax: [phone]  [id number] [place]: [phone]: [phone] Follow Up:  Patient agrees to Care Plan and Follow-up.  Plan: Telephone follow up appointment with care management team member scheduled for:  3 weeks   JRegina Eck PharmD, BCPS Clinical Pharmacist, [place] Phone [phone]

## 2021-04-17 DIAGNOSIS — E1165 Type 2 diabetes mellitus with hyperglycemia: Secondary | ICD-10-CM | POA: Diagnosis not present

## 2021-04-23 ENCOUNTER — Ambulatory Visit (INDEPENDENT_AMBULATORY_CARE_PROVIDER_SITE_OTHER): Payer: Medicare Other | Admitting: Pharmacist

## 2021-04-23 DIAGNOSIS — E1165 Type 2 diabetes mellitus with hyperglycemia: Secondary | ICD-10-CM

## 2021-04-23 NOTE — Progress Notes (Signed)
Chronic Care Management Pharmacy Note  04/23/2021 Name:  George Wang MRN:  [id number] DOB:  09/23/1945  Summary: T2DM  Recommendations/Changes made from today's visit: Diabetes: Controlled (A1C 7.2%); current treatment: METFORMIN (gfr 20),   Reports Bgs are doing okay despite d/c of trulicity Trulicity was still causing some nausea--patient has discontinued  Would consider rybelsus as needed for BG control/cardiac benefits Will apply for patient assistance if patient can tolerate Denies personal and family history of Medullary thyroid cancer (MTC) Continue metformin  Current glucose readings: fasting glucose:<130, post prandial glucose: <160 FBG this AM was 114 Denies hypoglycemic/hyperglycemic symptoms Discussed meal planning options and Plate method for healthy eating Avoid sugary drinks and desserts Incorporate balanced protein, non starchy veggies, 1 serving of carbohydrate with each meal Increase water intake Increase physical activity as able Current exercise: n/a Educated on diabetes Follow Up Plan: Telephone follow up appointment with care management team member scheduled for: 2 months   Subjective: George Wang is an 75 y.o. year old male who is a primary patient of Dettinger, Fransisca Kaufmann, MD.  The CCM team was consulted for assistance with disease management and care coordination needs.    Engaged with patient by telephone for follow up visit in response to provider referral for pharmacy case management and/or care coordination services.   Consent to Services:  The patient was given information about Chronic Care Management services, agreed to services, and gave verbal consent prior to initiation of services.  Please see initial visit note for detailed documentation.   Patient Care Team: Dettinger, Fransisca Kaufmann, MD as PCP - General (Family Medicine) Minus Breeding, MD as PCP - Cardiology (Cardiology) Druscilla Brownie, MD (Dermatology) Irine Seal, MD  (Urology) Minus Breeding, MD (Cardiology) Pyrtle, Lajuan Lines, MD as Consulting Physician (Gastroenterology) Ilean China, RN as Registered Nurse Lavera Guise, [facility] as Pharmacist (Family Medicine)  Objective:  Lab Results  Component Value Date   CREATININE 1.07 03/31/2021   CREATININE 0.96 07/28/2020   CREATININE 0.92 03/27/2020    Lab Results  Component Value Date   HGBA1C 5.4 03/31/2021   Last diabetic Eye exam:  Lab Results  Component Value Date/Time   HMDIABEYEEXA No Retinopathy 02/19/2020 12:00 AM    Last diabetic Foot exam: No results found for: HMDIABFOOTEX      Component Value Date/Time   CHOL 97 (L) 03/31/2021 1541   CHOL 103 11/15/2012 1108   TRIG 67 03/31/2021 1541   TRIG 70 12/30/2016 1151   TRIG 50 11/15/2012 1108   HDL 37 (L) 03/31/2021 1541   HDL 34 (L) 12/30/2016 1151   HDL 37 (L) 11/15/2012 1108   CHOLHDL 2.6 03/31/2021 1541   [place] 45 03/31/2021 1541   LDLCALC 61 01/29/2014 1135   [place] 56 11/15/2012 1108    Hepatic Function Latest Ref Rng & Units 03/31/2021 03/27/2020 12/05/2019  Total Protein 6.0 - 8.5 g/dL 6.4 6.5 6.0  Albumin 3.7 - 4.7 g/dL 4.5 4.3 4.2  AST 0 - 40 IU/L 50(H) 50(H) 54(H)  ALT 0 - 44 IU/L _0 Alk Phosphatase 44 - 121 IU/L 75 82 73  Total Bilirubin 0.0 - 1.2 mg/dL 0.5 0.5 0.6  Bilirubin, Direct 0.00 - 0.40 mg/dL - - -    Lab Results  Component Value Date/Time   TSH 1.330 12/05/2018 10:41 AM   TSH 0.675 10/05/2017 11:41 AM    CBC Latest Ref Rng & Units 03/31/2021 07/28/2020 03/27/2020  WBC 3.4 - 10.8 x10E3/uL 7.8  10.2 6.2  Hemoglobin 13.0 - 17.7 g/dL 12.6(L) 13.2 12.8(L)  Hematocrit 37.5 - 51.0 % 36.7(L) 38.6 37.4(L)  Platelets 150 - 450 x10E3/uL 269 266 251    Lab Results  Component Value Date/Time   VD25OH 57.7 11/03/2018 03:15 PM   VD25OH 49.7 07/03/2018 11:10 AM    Clinical ASCVD: No  The ASCVD Risk score (Arnett DK, et al., 2019) failed to calculate for the following reasons:   The valid total  cholesterol range is 130 to 320 mg/dL    Other: (CHADS2VASc if Afib, PHQ9 if depression, MMRC or CAT for COPD, ACT, DEXA)  Social History   Tobacco Use  Smoking Status Former   Packs/day: 1.00   Years: 19.00   Pack years: 19.00   Types: Cigarettes   Quit date: 10/13/1978   Years since quitting: 42.5  Smokeless Tobacco Former   Types: Snuff   Quit date: 05/30/2008   BP Readings from Last 3 Encounters:  03/31/21 127/69  02/11/21 111/70  11/27/20 129/66   Pulse Readings from Last 3 Encounters:  03/31/21 84  02/11/21 97  11/27/20 (!) 108   Wt Readings from Last 3 Encounters:  03/31/21 173 lb (78.5 kg)  02/11/21 179 lb (81.2 kg)  11/27/20 187 lb (84.8 kg)    Assessment: Review of patient past medical history, allergies, medications, health status, including review of consultants reports, laboratory and other test data, was performed as part of comprehensive evaluation and provision of chronic care management services.   SDOH:  (Social Determinants of Health) assessments and interventions performed:    CCM Care Plan  No Known Allergies  Medications Reviewed Today     Reviewed by Lavera Guise, [facility] (Pharmacist) on 04/23/21 at 1211  Med List Status: <None>   Medication Order Taking? Sig Documenting Provider Last Dose Status Informant  acetaminophen (TYLENOL) 325 MG tablet [id number] No Take 650 mg by mouth every 6 (six) hours as needed for mild pain. [provider] Taking Active Self  amLODipine (NORVASC) 10 MG tablet [id number] No Take 1 tablet (10 mg total) by mouth daily. Dettinger, Fransisca Kaufmann, MD Taking Active   atorvastatin (LIPITOR) 80 MG tablet [id number] No Take 0.5 tablets (40 mg total) by mouth daily at 6 PM. Dettinger, Fransisca Kaufmann, MD Taking Active   beta carotene 25000 UNIT capsule [id number] No Take 25,000 Units by mouth daily. [provider] Taking Active Self  Blood Glucose Calibration (OT ULTRA/FASTTK CNTRL SOLN) SOLN [id number] No USE WITH METER  AS DIRECTED DX E11.9 Dettinger, Fransisca Kaufmann, MD Taking Active   blood glucose meter kit and supplies [id number] No Dispense based on patient and insurance preference. Use up to four times daily as directed. (FOR ICD-10 E10.9, E11.9). Chipper Herb, MD Taking Active Self  busPIRone (BUSPAR) 15 MG tablet [id number] No 1  qam   2  qhs Plovsky, Berneta Sages, MD Taking Active   chlorthalidone (HYGROTON) 25 MG tablet [id number] No Take 0.5 tablets (12.5 mg total) by mouth daily. for blood pressure Dettinger, Fransisca Kaufmann, MD Taking Active   Cholecalciferol (VITAMIN D) 2000 UNITS CAPS [id number] No Take 2 capsules by mouth daily.  [provider] Taking Active Self  Discontinued 10/13/11 1130 (Error)   FeFum-FePo-FA-B Cmp-C-Zn-Mn-Cu (SE-TAN PLUS) 162-115.2-1 MG CAPS [id number]  Take 1 capsule by mouth 2 (two) times daily. Dettinger, Fransisca Kaufmann, MD  Active   Fiber POWD [id number] No Take 1 Scoop by mouth daily as needed (bowel movement).  [provider] Taking Active Self  fish oil-omega-3 fatty acids 1000 MG capsule [id number] No Take 1 g by mouth 2 (two) times daily.  [provider] Taking Active Self  LORazepam (ATIVAN) 0.5 MG tablet [id number] No 1  qam  1  Midday  2  qhs Plovsky, Gerald, MD Taking Active   losartan (COZAAR) 100 MG tablet [id number] No Take 1 tablet (100 mg total) by mouth daily. Dettinger, Fransisca Kaufmann, MD Taking Active   metFORMIN (GLUCOPHAGE) 500 MG tablet [id number] No Take 1-2 tablets (500-1,000 mg total) by mouth See admin instructions. Take 2 tablets (1,075m) by mouth every morning and 1 tablet (5070m by mouth every evening. Dettinger, JoFransisca KaufmannMD Taking Active   mirtazapine (REMERON SOL-TAB) 15 MG disintegrating tablet [id number] Take one 15 mg tablet each morning. PlNorma FredricksonMD Taking Active   mirtazapine (REMERON) 30 MG tablet [id number] 1  qhs Plovsky, GeBerneta SagesMD Taking Active   Multiple Vitamins-Minerals (CENTRUM SILVER PO) [id number] Take 1 tablet by mouth daily.   [provider] Taking Active Self  omeprazole (PRILOSEC) 20 MG capsule [id number] Take 1 capsule (20 mg total) by mouth daily. Dettinger, JoFransisca KaufmannMD Taking Active   ONNew Century Spine And Outpatient Surgical InstituteLTRA test strip [id number] USE UP TO 4 TIMES PER DAY Dettinger, JoFransisca KaufmannMD Taking Active   Discontinued 10/13/11 1131 (Error)   temazepam (RESTORIL) 15 MG capsule [id number] 2 qhs Plovsky, GeBerneta SagesMD Taking Active   venlafaxine XR (EFFEXOR XR) 75 MG 24 hr capsule [id number] 3  qam PlNorma FredricksonMD Taking Active             Patient Active Problem List   Diagnosis Date Noted   Murmur, cardiac 04/05/2018   Thoracic aortic atherosclerosis (HCShannon Hills10/22/2018   Morbid obesity (HCProctor05/11/2015   Essential hypertension 02/28/2013   Hyperlipemia 02/28/2013   Type 2 diabetes mellitus (HCCeloron08/13/2014   Vitamin D deficiency 02/28/2013   Overweight 10/13/2011   Frequent PVCs 10/13/2011   HEMOCCULT POSITIVE STOOL 08/14/2008   Iron deficiency anemia 06/27/2008   GOUT 04/25/2008   Anxiety state 04/25/2008   Depression 04/25/2008   DIVERTICULOSIS, COLON 04/25/2008   IRRITABLE BOWEL SYNDROME 04/25/2008   NEPHROLITHIASIS 04/25/2008   PROSTATE CANCER, HX OF 04/25/2008    Immunization History  Administered Date(s) Administered   Fluad Quad(high Dose 65+) 04/06/2019, 05/06/2020   H1N1 06/25/2008   Influenza, High Dose Seasonal PF 04/14/2016, 05/09/2017, 05/02/2018   Influenza,inj,Quad PF,6+ Mos 05/14/2013, 05/02/2014, 04/18/2015   Influenza-Unspecified 04/14/2009, 05/06/2010, 05/20/2011, 05/02/2012   Moderna Sars-Covid-2 Vaccination 08/30/2019, 09/28/2019, 06/04/2020   Pneumococcal Conjugate-13 06/19/2013, 05/02/2014   Pneumococcal Polysaccharide-23 01/15/2010   Td 05/20/2011   Tdap 05/20/2011   Zoster, Live 01/17/2007    Conditions to be addressed/monitored: DMII  Care Plan : PHARMD MEDICATION MANAGEMENT  Updates made by PrLavera GuiseRPFalcon Heightsince 04/23/2021 12:00 AM     Problem:  DISEASE PROGRESSION PREVENTION      Long-Range Goal: T2DM   This Visit's Progress: On track  Recent Progress: Not on track  Priority: High  Note:   Current Barriers:  Unable to independently afford treatment regimen Unable to achieve control of T2DM  Suboptimal therapeutic regimen for T2DM  Pharmacist Clinical Goal(s):  Over the next 90 days, patient will verbalize ability to afford treatment regimen achieve control of T2DM as evidenced by IMPROVED GLYCEMIC CONTROL  through collaboration with PharmD and provider.    Interventions: 1:1 collaboration with Dettinger, JoFransisca KaufmannMD regarding development and update of comprehensive plan of  care as evidenced by provider attestation and co-signature Inter-disciplinary care team collaboration (see longitudinal plan of care) Comprehensive medication review performed; medication list updated in electronic medical record  Diabetes: Controlled (A1C 7.2%); current treatment: METFORMIN (gfr 76),   Reports Bgs are doing okay despite d/c of trulicity Trulicity was still causing some nausea--patient has discontinued  Would consider rybelsus as needed for BG control/cardiac benefits Will apply for patient assistance if patient can tolerate Denies personal and family history of Medullary thyroid cancer (MTC) Continue metformin  Current glucose readings: fasting glucose:<130, post prandial glucose: <160 FBG this AM was 114 Denies hypoglycemic/hyperglycemic symptoms Discussed meal planning options and Plate method for healthy eating Avoid sugary drinks and desserts Incorporate balanced protein, non starchy veggies, 1 serving of carbohydrate with each meal Increase water intake Increase physical activity as able Current exercise: n/a Educated on diabetes   Patient Goals/Self-Care Activities Over the next 90 days, patient will:  - take medications as prescribed check glucose DAILY, document, and provide at future appointments collaborate with  provider on medication access solutions  Follow Up Plan: Telephone follow up appointment with care management team member scheduled for: 2 months      Medication Assistance: None required.  Patient affirms current coverage meets needs.  Patient's preferred pharmacy is:  [facility], [place] [address] Phone: [phone] Fax: [phone]  [facility] [place], [place] [address] [place] [address] Phone: [phone] Fax: [phone]  [id number] [id number]: [phone]: [phone] Follow Up:  Patient agrees to Care Plan and Follow-up.  Plan: Telephone follow up appointment with care management team member scheduled for:  2 MONTHS  JRegina Eck PharmD, BCPS Clinical Pharmacist, [place] Phone [phone]

## 2021-04-23 NOTE — Patient Instructions (Signed)
Visit Information  PATIENT GOALS:  Goals Addressed               This Visit's Progress     Patient Stated     T2DM PHARMD (pt-stated)        Current Barriers:  Unable to independently afford treatment regimen Unable to achieve control of T2DM  Suboptimal therapeutic regimen for T2DM  Pharmacist Clinical Goal(s):  Over the next 90 days, patient will verbalize ability to afford treatment regimen achieve control of T2DM as evidenced by IMPROVED GLYCEMIC CONTROL through collaboration with PharmD and provider.    Interventions: 1:1 collaboration with Dettinger, Fransisca Kaufmann, MD regarding development and update of comprehensive plan of care as evidenced by provider attestation and co-signature Inter-disciplinary care team collaboration (see longitudinal plan of care) Comprehensive medication review performed; medication list updated in electronic medical record  Diabetes: Controlled (A1C 7.2%); current treatment: METFORMIN (gfr 97),   Reports Bgs are doing okay despite d/c of trulicity Trulicity was still causing some nausea--patient has discontinued  Would consider rybelsus as needed for BG control/cardiac benefits Will apply for patient assistance if patient can tolerate Denies personal and family history of Medullary thyroid cancer (MTC) Continue metformin  Current glucose readings: fasting glucose:<130, post prandial glucose: <160 FBG this AM was 114 Denies hypoglycemic/hyperglycemic symptoms Discussed meal planning options and Plate method for healthy eating Avoid sugary drinks and desserts Incorporate balanced protein, non starchy veggies, 1 serving of carbohydrate with each meal Increase water intake Increase physical activity as able Current exercise: n/a Educated on diabetes   Patient Goals/Self-Care Activities Over the next 90 days, patient will:  - take medications as prescribed check glucose DAILY, document, and provide at future appointments collaborate with  provider on medication access solutions  Follow Up Plan: Telephone follow up appointment with care management team member scheduled for: 2 months         The patient verbalized understanding of instructions, educational materials, and care plan provided today and declined offer to receive copy of patient instructions, educational materials, and care plan.   Telephone follow up appointment with care management team member scheduled for: 2 MONTHS  Signature Regina Eck, PharmD, BCPS Clinical Pharmacist, [place]  [place] Phone [phone]

## 2021-05-18 DIAGNOSIS — E1165 Type 2 diabetes mellitus with hyperglycemia: Secondary | ICD-10-CM | POA: Diagnosis not present

## 2021-05-20 ENCOUNTER — Ambulatory Visit (HOSPITAL_COMMUNITY): Payer: Medicare Other | Admitting: Psychiatry

## 2021-05-27 DIAGNOSIS — Z23 Encounter for immunization: Secondary | ICD-10-CM | POA: Diagnosis not present

## 2021-06-02 ENCOUNTER — Ambulatory Visit (HOSPITAL_BASED_OUTPATIENT_CLINIC_OR_DEPARTMENT_OTHER): Payer: Medicare Other | Admitting: Psychiatry

## 2021-06-02 ENCOUNTER — Other Ambulatory Visit: Payer: Self-pay

## 2021-06-02 DIAGNOSIS — F325 Major depressive disorder, single episode, in full remission: Secondary | ICD-10-CM

## 2021-06-02 MED ORDER — BUSPIRONE HCL 15 MG PO TABS: ORAL_TABLET | ORAL | 5 refills | Status: DC

## 2021-06-02 MED ORDER — MIRTAZAPINE 15 MG PO TBDP: ORAL_TABLET | ORAL | 2 refills | Status: DC

## 2021-06-02 MED ORDER — TEMAZEPAM 15 MG PO CAPS: ORAL_CAPSULE | ORAL | 2 refills | Status: DC

## 2021-06-02 MED ORDER — LORAZEPAM 0.5 MG PO TABS: ORAL_TABLET | ORAL | 2 refills | Status: DC

## 2021-06-02 MED ORDER — MIRTAZAPINE 30 MG PO TABS: ORAL_TABLET | ORAL | 3 refills | Status: DC

## 2021-06-02 NOTE — Progress Notes (Signed)
Psychiatric Initial Adult Assessment   Patient Identification: George Wang MRN:  [id number] Date of Evaluation:  06/02/2021 Referral Source: Community/emergency room Chief Complaint:   Visit Diagnosis: Major depression  History of Present Illness:     Today the patient is seen with his wife.  The patient is doing very well.  His mood is good.  He is very active.  He works a lot and likes it.  He went to the beach for about a week and had a great time.  Today we described to him the idea that it is hard to feel like her back to herself when she did in a depressed state.  The reality is I do think he is back to his baseline.  They even talked about the possibility of reducing some of his medicines.  I told him we should hold off until after the holidays the patient is sleeping and eating quite well.  He is losing weight and is not sure why.  It is noted he has diabetes and his hemoglobin A1c is down to 5.  The patient has 2 dogs that she had he enjoys.  Patient goes to church.  Enjoys watching television a great deal.  He has never been psychotic.  He never drinks alcohol or uses any drugs.  The patient is functioning very well.  His wife thinks he is doing well.  He continues in one-to-one therapy at [facility].  The patient ultimately will have to discuss the process of retired.  I think the time it is really hard for this patient as he is used to working.  When he works he feels great.  He describes where he will be for a few hours in the morning he might feel depressed but he realizes that he has to do things to stay active and the depressed mood seems to abate. Depression Symptoms:  depressed mood, (Hypo) Manic Symptoms:   Anxiety Symptoms:  Excessive Worry, Psychotic Symptoms:   PTSD Symptoms:   Past Psychiatric History: 1 psychiatric hospitalization in 1989 presently on Lexapro 10 mg, BuSpar 30 mg  Previous Psychotropic Medications: Yes   Substance Abuse History in the last 12 months:   No.  Consequences of Substance Abuse:   Past Medical History:  Past Medical History:  Diagnosis Date   Allergy    mild   Anemia    Iron deficiency   Anxiety state, unspecified    Aortic atherosclerosis ([place])    Arthritis    Calculus of kidney    kidney stones  - normal BMP    Cancer (HCC)    Prostate, skin cancer x 2    Cataract    forming both eyes   Depressive disorder, not elsewhere classified    Diverticulosis of colon (without mention of hemorrhage)    GERD (gastroesophageal reflux disease)    on nexium   Gout, unspecified    Heart murmur    Hyperlipidemia    Hypertension, essential    Irritable bowel syndrome    Neuromuscular disorder (HCC)    numbness in knees from back issues    Personal history of malignant neoplasm of prostate    PVC (premature ventricular contraction)    Type II or unspecified type diabetes mellitus without mention of complication, not stated as uncontrolled     Past Surgical History:  Procedure Laterality Date   COLONOSCOPY     HERNIA REPAIR     left side   PROSTATE SURGERY  skin cancer removed     chest x 2 3-4 yrs ago    TONSILLECTOMY      Family Psychiatric History:   Family History:  Family History  Problem Relation Age of Onset   Cancer Maternal Aunt        breast   Pancreatic cancer Paternal Aunt    Prostate cancer Paternal Uncle    Heart disease Maternal Grandmother 80   Heart disease Maternal Grandfather        fluid / CHF    Heart disease Paternal Grandfather 14   Heart attack Paternal Grandfather        passed at 57   Prostate cancer Father    Coronary artery disease Father 53       CABG   Hypertension Mother    Hyperlipidemia Mother    Other Daughter        accident with tornado 09/14/96   Hypertension Son    Colon cancer Neg Hx    Colon polyps Neg Hx    Esophageal cancer Neg Hx    Rectal cancer Neg Hx    Stomach cancer Neg Hx     Social History:   Social History   Socioeconomic History   Marital  status: Married    Spouse name: [name]   Number of children: 2   Years of education: 74   Highest education level: Some college, no degree  Occupational History   Occupation: Production designer, theatre/television/film    Comment: Part time  Tobacco Use   Smoking status: Former    Packs/day: 1.00    Years: 19.00    Pack years: 19.00    Types: Cigarettes    Quit date: 10/13/1978    Years since quitting: 42.6   Smokeless tobacco: Former    Types: Snuff    Quit date: 05/30/2008  Vaping Use   Vaping Use: Never used  Substance and Sexual Activity   Alcohol use: Not Currently   Drug use: No   Sexual activity: Yes  Other Topics Concern   Not on file  Social History Narrative   Lives at home with wife.  He has one son and a daughter that died in a tornado in 80. 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 He has one living grandson and one grandson that passed away in 09-14-2016 at age 13 from cancer.   Social Determinants of Health   Financial Resource Strain: Not on file  Food Insecurity: Not on file  Transportation Needs: Not on file  Physical Activity: Not on file  Stress: Not on file  Social Connections: Not on file    Additional Social History:   Allergies:  No Known Allergies  Metabolic Disorder Labs: Lab Results  Component Value Date   HGBA1C 5.4 03/31/2021   No results found for: PROLACTIN Lab Results  Component Value Date   CHOL 97 (L) 03/31/2021   TRIG 67 03/31/2021   HDL 37 (L) 03/31/2021   CHOLHDL 2.6 03/31/2021   LDLCALC 45 03/31/2021   LDLCALC 70 11/28/2020   Lab Results  Component Value Date   TSH 1.330 12/05/2018    Therapeutic Level Labs: No results found for: LITHIUM No results found for: CBMZ No results found for: VALPROATE  Current Medications: Current Outpatient Medications  Medication Sig Dispense Refill   acetaminophen (TYLENOL) 325 MG tablet Take 650 mg by mouth every 6 (six) hours as needed for mild pain.     amLODipine (NORVASC) 10 MG  tablet  Take 1 tablet (10 mg total) by mouth daily. 90 tablet 3   atorvastatin (LIPITOR) 80 MG tablet Take 0.5 tablets (40 mg total) by mouth daily at 6 PM. 45 tablet 3   beta carotene 25000 UNIT capsule Take 25,000 Units by mouth daily.     Blood Glucose Calibration (OT ULTRA/FASTTK CNTRL SOLN) SOLN USE WITH METER AS DIRECTED DX E11.9 1 each 1   blood glucose meter kit and supplies Dispense based on patient and insurance preference. Use up to four times daily as directed. (FOR ICD-10 E10.9, E11.9). 1 each 1   busPIRone (BUSPAR) 15 MG tablet 1  qam   2  qhs 90 tablet 5   chlorthalidone (HYGROTON) 25 MG tablet Take 0.5 tablets (12.5 mg total) by mouth daily. for blood pressure 45 tablet 3   Cholecalciferol (VITAMIN D) 2000 UNITS CAPS Take 2 capsules by mouth daily.      FeFum-FePo-FA-B Cmp-C-Zn-Mn-Cu (SE-TAN PLUS) 162-115.2-1 MG CAPS Take 1 capsule by mouth 2 (two) times daily. 180 capsule 3   Fiber POWD Take 1 Scoop by mouth daily as needed (bowel movement).      fish oil-omega-3 fatty acids 1000 MG capsule Take 1 g by mouth 2 (two) times daily.      LORazepam (ATIVAN) 0.5 MG tablet 1  qam  1  Midday  2  qhs 120 tablet 2   losartan (COZAAR) 100 MG tablet Take 1 tablet (100 mg total) by mouth daily. 90 tablet 3   metFORMIN (GLUCOPHAGE) 500 MG tablet Take 1-2 tablets (500-1,000 mg total) by mouth See admin instructions. Take 2 tablets (1,$RemoveBefo'000mg'pDTgjXeGZDf$ ) by mouth every morning and 1 tablet ($RemoveB'500mg'JjMybioF$ ) by mouth every evening. 270 tablet 3   mirtazapine (REMERON SOL-TAB) 15 MG disintegrating tablet Take one 15 mg tablet each morning. 30 tablet 2   mirtazapine (REMERON) 30 MG tablet 1  qhs 30 tablet 3   Multiple Vitamins-Minerals (CENTRUM SILVER PO) Take 1 tablet by mouth daily.      omeprazole (PRILOSEC) 20 MG capsule Take 1 capsule (20 mg total) by mouth daily. 90 capsule 3   ONETOUCH ULTRA test strip USE UP TO 4 TIMES PER DAY 100 strip 12   temazepam (RESTORIL) 15 MG capsule 2 qhs 60 capsule 2   venlafaxine XR  (EFFEXOR XR) 75 MG 24 hr capsule 3  qam 90 capsule 5   No current facility-administered medications for this visit.    Musculoskeletal: Strength & Muscle Tone: within normal limits Gait & Station: normal Patient leans: N/A  Psychiatric Specialty Exam: Review of Systems  There were no vitals taken for this visit.There is no height or weight on file to calculate BMI.  General Appearance: Casual  Eye Contact:  Good  Speech:  Clear and Coherent  Volume:  Normal  Mood:  Depressed  Affect:  Appropriate  Thought Process:  Coherent  Orientation:  Full (Time, Place, and Person)  Thought Content:  WDL  Suicidal Thoughts:  No  Homicidal Thoughts:  No  Memory:  Negative  Judgement:  Good  Insight:  Good  Psychomotor Activity:  Normal  Concentration:    Recall:  Good  Fund of Knowledge:Fair  Language: Good  Akathisia:  No  Handed:  Right  AIMS (if indicated):  not done  Assets:  Desire for Improvement  ADL's:  Intact  Cognition: WNL  Sleep:  Good   Screenings: GAD-7    Flowsheet Row Office Visit from 03/31/2021 in [place] Office Visit from 02/11/2021 in  [place] Office Visit from 11/27/2020 in [place] Office Visit from 03/27/2020 in [place] Visit from 12/05/2019 in [place]  Total GAD-7 Score _0 Mini-Mental    Flowsheet Row Clinical Support from 05/16/2017 in [place]  Total Score (max 30 points ) 29      PHQ2-9    [place] Visit from 03/31/2021 in [place] Visit from 02/11/2021 in [place] Office Visit from 11/27/2020 in [place] Office Visit from 07/28/2020 in [place] from 07/14/2020 in [place]  PHQ-2 Total Score _1 PHQ-9 Total  Score _2 -- --       Assessment and Plan:  11/15/20224:10 PM   This patient has 2 problems here.  The first 1 is major depression.  At his last visit we increased his Effexor to a higher dose and I believe he believes it is helpful.  He continues taking Remeron 15 mg in the morning and 30 mg at night.  When his next visit if he is doing as well as he is doing today we will reduce the Remeron to 15 mg twice daily.  His second problem is insomnia.  He will continue taking Restoril as prescribed.  His third problem is an adjustment disorder with an anxious mood state.  Presently takes 0.5 mg of Ativan 4 times daily and continues taking BuSpar.  We will continue in one-to-one therapy and he will return to see me in 3 months.  The patient is doing much better.

## 2021-06-17 DIAGNOSIS — Z23 Encounter for immunization: Secondary | ICD-10-CM | POA: Diagnosis not present

## 2021-06-18 ENCOUNTER — Ambulatory Visit: Payer: Medicare Other

## 2021-06-19 ENCOUNTER — Ambulatory Visit: Payer: Medicare Other

## 2021-06-23 ENCOUNTER — Encounter: Payer: Self-pay | Admitting: Internal Medicine

## 2021-07-08 ENCOUNTER — Other Ambulatory Visit: Payer: Self-pay | Admitting: Family Medicine

## 2021-07-08 DIAGNOSIS — E782 Mixed hyperlipidemia: Secondary | ICD-10-CM

## 2021-07-15 ENCOUNTER — Other Ambulatory Visit: Payer: Self-pay | Admitting: Family Medicine

## 2021-07-15 ENCOUNTER — Ambulatory Visit (INDEPENDENT_AMBULATORY_CARE_PROVIDER_SITE_OTHER): Payer: Medicare Other | Admitting: *Deleted

## 2021-07-15 DIAGNOSIS — Z0000 Encounter for general adult medical examination without abnormal findings: Secondary | ICD-10-CM | POA: Diagnosis not present

## 2021-07-15 DIAGNOSIS — E1169 Type 2 diabetes mellitus with other specified complication: Secondary | ICD-10-CM

## 2021-07-15 NOTE — Progress Notes (Signed)
MEDICARE ANNUAL WELLNESS VISIT  07/15/2021  Telephone Visit Disclaimer This Medicare AWV was conducted by telephone due to national recommendations for restrictions regarding the COVID-19 Pandemic (e.g. social distancing).  I verified, using two identifiers, that I am speaking with George Wang or their authorized healthcare agent. I discussed the limitations, risks, security, and privacy concerns of performing an evaluation and management service by telephone and the potential availability of an in-person appointment in the future. The patient expressed understanding and agreed to proceed.  Location of Patient: home Location of Provider (nurse):  office  Subjective:    George Wang is a 75 y.o. male patient of Dettinger, Fransisca Kaufmann, MD who had a Medicare Annual Wellness Visit today via telephone. George Wang is Working part time and lives with their spouse. he has one living children. he reports that he is socially active and does interact with friends/family regularly. he is not physically active and enjoys he reports his pets..  Patient Care Team: Dettinger, Fransisca Kaufmann, MD as PCP - General (Family Medicine) Minus Breeding, MD as PCP - Cardiology (Cardiology) Druscilla Brownie, MD (Dermatology) Irine Seal, MD (Urology) Minus Breeding, MD (Cardiology) Pyrtle, Lajuan Lines, MD as Consulting Physician (Gastroenterology) Ilean China, RN as Registered Nurse Lavera Guise, [facility] as Pharmacist (Family Medicine)  Advanced Directives 07/15/2021 09/18/2019 09/16/2019 07/06/2019 07/13/2018 06/20/2018 05/30/2018  Does Patient Have a Medical Advance Directive? Yes Yes Yes Yes No No No  Type of Paramedic of [place];Living will [place];Living will [place];Living will Living will;Healthcare Power of Attorney - - -  Does patient want to make changes to medical advance directive? No - Patient declined - - No - Patient declined - - -  Copy  of [place] in Chart? No - copy requested - No - copy requested No - copy requested - - -  Would patient like information on creating a medical advance directive? - No - Patient declined - - - - Yes (MAU/Ambulatory/Procedural Areas - Information given)    Hospital Utilization Over the Past 12 Months: # of hospitalizations or ER visits: 0 # of surgeries: 0  Review of Systems    Patient reports that his overall health is unchanged compared to last year.  History obtained from chart review and the patient  Patient Reported Readings (BP, Pulse, CBG, Weight, etc) none  Pain Assessment Pain : No/denies pain     Current Medications & Allergies (verified) Allergies as of 07/15/2021   No Known Allergies      Medication List        Accurate as of July 15, 2021 11:08 AM. If you have any questions, ask your nurse or doctor.          STOP taking these medications    Fiber Powd       TAKE these medications    acetaminophen 325 MG tablet Commonly known as: TYLENOL Take 650 mg by mouth every 6 (six) hours as needed for mild pain.   amLODipine 10 MG tablet Commonly known as: NORVASC Take 1 tablet (10 mg total) by mouth daily.   atorvastatin 80 MG tablet Commonly known as: LIPITOR TAKE 1/2 TABLET DAILY AT 6PM   beta carotene 25000 UNIT capsule Take 25,000 Units by mouth daily.   blood glucose meter kit and supplies Dispense based on patient and insurance preference. Use up to four times daily as directed. (FOR ICD-10 E10.9, E11.9).   busPIRone  15 MG tablet Commonly known as: BUSPAR 1  qam   2  qhs   CENTRUM SILVER PO Take 1 tablet by mouth daily.   chlorthalidone 25 MG tablet Commonly known as: HYGROTON Take 0.5 tablets (12.5 mg total) by mouth daily. for blood pressure   fish oil-omega-3 fatty acids 1000 MG capsule Take 1 g by mouth 2 (two) times daily.   LORazepam 0.5 MG tablet Commonly known as: Ativan 1  qam  1  Midday  2   qhs   losartan 100 MG tablet Commonly known as: COZAAR Take 1 tablet (100 mg total) by mouth daily.   metFORMIN 500 MG tablet Commonly known as: GLUCOPHAGE Take 1-2 tablets (500-1,000 mg total) by mouth See admin instructions. Take 2 tablets (1,08m) by mouth every morning and 1 tablet (5079m by mouth every evening.   mirtazapine 15 MG disintegrating tablet Commonly known as: REMERON SOL-TAB Take one 15 mg tablet each morning.   mirtazapine 30 MG tablet Commonly known as: Remeron 1  qhs   omeprazole 20 MG capsule Commonly known as: PRILOSEC Take 1 capsule (20 mg total) by mouth daily.   OneTouch Ultra test strip Generic drug: glucose blood USE UP TO 4 TIMES PER DAY   OT ULTRA/FASTTK CNTRL SOLN Soln USE WITH METER AS DIRECTED DX E11.9   Se-Tan PLUS 162-115.2-1 MG Caps Take 1 capsule by mouth 2 (two) times daily.   temazepam 15 MG capsule Commonly known as: RESTORIL 2 qhs   venlafaxine XR 75 MG 24 hr capsule Commonly known as: Effexor XR 3  qam   Vitamin D 50 MCG (2000 UT) Caps Take 2 capsules by mouth daily.        History (reviewed): Past Medical History:  Diagnosis Date   Allergy    mild   Anemia    Iron deficiency   Anxiety state, unspecified    Aortic atherosclerosis (HCC)    Arthritis    Calculus of kidney    kidney stones  - normal BMP    Cancer (HCC)    Prostate, skin cancer x 2    Cataract    forming both eyes   Depressive disorder, not elsewhere classified    Diverticulosis of colon (without mention of hemorrhage)    GERD (gastroesophageal reflux disease)    on nexium   Gout, unspecified    Heart murmur    Hyperlipidemia    Hypertension, essential    Irritable bowel syndrome    Neuromuscular disorder (HCC)    numbness in knees from back issues    Personal history of malignant neoplasm of prostate    PVC (premature ventricular contraction)    Type II or unspecified type diabetes mellitus without mention of complication, not stated  as uncontrolled    Past Surgical History:  Procedure Laterality Date   COLONOSCOPY     HERNIA REPAIR     left side   PROSTATE SURGERY     skin cancer removed     chest x 2 3-4 yrs ago    TONSILLECTOMY     Family History  Problem Relation Age of Onset   Cancer Maternal Aunt        breast   Pancreatic cancer Paternal Aunt    Prostate cancer Paternal Uncle    Heart disease Maternal Grandmother 69   Heart disease Maternal Grandfather        fluid / CHF    Heart disease Paternal Grandfather [id number] Heart attack Paternal Grandfather  passed at 51   Prostate cancer Father    Coronary artery disease Father 56       CABG   Hypertension Mother    Hyperlipidemia Mother    Other Daughter        accident with tornado Oct 05, 1996   Hypertension Son    Colon cancer Neg Hx    Colon polyps Neg Hx    Esophageal cancer Neg Hx    Rectal cancer Neg Hx    Stomach cancer Neg Hx    Social History   Socioeconomic History   Marital status: Married    Spouse name: [name]   Number of children: 2   Years of education: 13   Highest education level: Some college, no degree  Occupational History   Occupation: Production designer, theatre/television/film    Comment: Part time  Tobacco Use   Smoking status: Former    Packs/day: 1.00    Years: 19.00    Pack years: 19.00    Types: Cigarettes    Quit date: 10/13/1978    Years since quitting: 42.7   Smokeless tobacco: Former    Types: Snuff    Quit date: 05/30/2008  Vaping Use   Vaping Use: Never used  Substance and Sexual Activity   Alcohol use: Not Currently   Drug use: No   Sexual activity: Yes  Other Topics Concern   Not on file  Social History Narrative   Lives at home with wife.  He has one son and a daughter that died in a tornado in 28. 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 He has one living grandson and one grandson that passed away in 10-05-16 at age 64 from cancer.   Social Determinants of Health   Financial Resource Strain: Low  Risk    Difficulty of Paying Living Expenses: Not hard at all  Food Insecurity: No Food Insecurity   Worried About Charity fundraiser in the Last Year: Never true   [place] in the Last Year: Never true  Transportation Needs: No Transportation Needs   Lack of Transportation (Medical): No   Lack of Transportation (Non-Medical): No  Physical Activity: Inactive   Days of Exercise per Week: 0 days   Minutes of Exercise per Session: 0 min  Stress: No Stress Concern Present   Feeling of Stress : Only a little  Social Connections: Engineer, building services of Communication with Friends and Family: More than three times a week   Frequency of Social Gatherings with Friends and Family: Once a week   Attends Religious Services: More than 4 times per year   Active Member of [employer] or Organizations: Yes   Attends Archivist Meetings: 1 to 4 times per year   Marital Status: Married    Activities of Daily Living In your present state of health, do you have any difficulty performing the following activities: 07/15/2021 07/15/2021  Hearing? N -  Vision? N -  Difficulty concentrating or making decisions? N -  Walking or climbing stairs? N -  Dressing or bathing? N -  Doing errands, shopping? N -  Preparing Food and eating ? N -  Using the Toilet? N -  In the past six months, have you accidently leaked urine? N -  Do you have problems with loss of bowel control? N -  Managing your Medications? N -  Managing your Finances? N N  Housekeeping or managing your Housekeeping? N N  Some recent  data might be hidden    Patient Education/ Literacy How often do you need to have someone help you when you read instructions, pamphlets, or other written materials from your doctor or pharmacy?: 1 - Never What is the last grade level you completed in school?: 12  Exercise Current Exercise Habits: The patient does not participate in regular exercise at present, Exercise limited by:  None identified  Diet Patient reports consuming 2 meals a day and 1 snack(s) a day Patient reports that his primary diet is: Low Sodium, Diabetic Patient reports that she does have regular access to food.   Depression Screen PHQ 2/9 Scores 07/15/2021 03/31/2021 02/11/2021 11/27/2020 07/28/2020 07/14/2020 03/27/2020  PHQ - 2 Score 6 4 4 2 2 2 2   PHQ- 9 Score 15 11 11 5  - - 4     Fall Risk Fall Risk  07/15/2021 03/31/2021 02/11/2021 11/27/2020 07/28/2020  Falls in the past year? 1 1 1  0 1  Comment - - - - -  Number falls in past yr: 1 0 0 - 1  Injury with Fall? 0 0 0 - 0  Risk for fall due to : History of fall(s) History of fall(s) History of fall(s) - History of fall(s)  Risk for fall due to: Comment - - - - -  Follow up Falls prevention discussed Falls evaluation completed Falls evaluation completed - Falls evaluation completed  Comment - - - - -     Objective:  George Wang seemed alert and oriented and he participated appropriately during our telephone visit.  Blood Pressure Weight BMI  BP Readings from Last 3 Encounters:  03/31/21 127/69  02/11/21 111/70  11/27/20 129/66   Wt Readings from Last 3 Encounters:  03/31/21 173 lb (78.5 kg)  02/11/21 179 lb (81.2 kg)  11/27/20 187 lb (84.8 kg)   BMI Readings from Last 1 Encounters:  03/31/21 30.65 kg/m    *Unable to obtain current vital signs, weight, and BMI due to telephone visit type  Hearing/Vision  Tyresse did not seem to have difficulty with hearing/understanding during the telephone conversation Reports that he has had a formal eye exam by an eye care professional within the past year Reports that he has not had a formal hearing evaluation within the past year *Unable to fully assess hearing and vision during telephone visit type  Cognitive Function: 6CIT Screen 07/15/2021 07/14/2020 07/06/2019 05/30/2018  What Year? 0 points 0 points 0 points 0 points  What month? 0 points 0 points 0 points 0 points  What time? 0  points 0 points 0 points 0 points  Count back from 20 0 points 0 points 0 points 0 points  Months in reverse 2 points 0 points 0 points 0 points  Repeat phrase 2 points 0 points 0 points 0 points  Total Score 4 0 0 0   (Normal:0-7, Significant for Dysfunction: >8)  Normal Cognitive Function Screening: Yes   Immunization & Health Maintenance Record Immunization History  Administered Date(s) Administered   Fluad Quad(high Dose 65+) 04/06/2019, 05/06/2020   H1N1 06/25/2008   Influenza, High Dose Seasonal PF 04/14/2016, 05/09/2017, 05/02/2018   Influenza,inj,Quad PF,6+ Mos 05/14/2013, 05/02/2014, 04/18/2015   Influenza-Unspecified 04/14/2009, 05/06/2010, 05/20/2011, 05/02/2012   Moderna Sars-Covid-2 Vaccination 08/30/2019, 09/28/2019, 06/04/2020   Pneumococcal Conjugate-13 06/19/2013, 05/02/2014   Pneumococcal Polysaccharide-23 01/15/2010   Td 05/20/2011   Tdap 05/20/2011   Zoster, Live 01/17/2007    Health Maintenance  Topic Date Due   Zoster Vaccines- Shingrix (1 of  2) Never done   COVID-19 Vaccine (4 - Booster for Moderna series) 07/30/2020   INFLUENZA VACCINE  02/16/2021   OPHTHALMOLOGY EXAM  02/24/2021   TETANUS/TDAP  05/19/2021   COLONOSCOPY (Pts 45-66yr Insurance coverage will need to be confirmed)  06/20/2021   HEMOGLOBIN A1C  09/28/2021   FOOT EXAM  11/27/2021   Pneumonia Vaccine 75 Years old  Completed   Hepatitis C Screening  Completed   HPV VACCINES  Aged Out       Assessment  This is a routine wellness examination for JHOBY KAWAI  Health Maintenance: Due or Overdue Health Maintenance Due  Topic Date Due   Zoster Vaccines- Shingrix (1 of 2) Never done   COVID-19 Vaccine (4 - Booster for Moderna series) 07/30/2020   INFLUENZA VACCINE  02/16/2021   OPHTHALMOLOGY EXAM  02/24/2021   TETANUS/TDAP  05/19/2021   COLONOSCOPY (Pts 45-480yrInsurance coverage will need to be confirmed)  06/20/2021    George Lukeoes not need a referral for Community  Assistance: Care Management:   no Social Work:    no Prescription Assistance:  no Nutrition/Diabetes Education:  no   Plan:  Personalized Goals  Goals Addressed   None    Personalized Health Maintenance & Screening Recommendations  Shingrix Td  Lung Cancer Screening Recommended: no (Low Dose CT Chest recommended if Age 77-80ears, 30 pack-year currently smoking OR have quit w/in past 15 years) Hepatitis C Screening recommended: no HIV Screening recommended: no  Advanced Directives: Written information was not prepared per patient's request.  Referrals & Orders No orders of the defined types were placed in this encounter.   Follow-up Plan Follow-up with Dettinger, JoFransisca KaufmannMD as planned on 08/03/21 Pt is due for Td, shingrix and colonoscopy. Pt denies hearing difficulty. Vision is good with glasses, just had diabetic eye exam in Aug.2022, copy requested. Pt has living will and POA. Pt remains independent with all ADL's. He is not active unless he has a job to do, pt doesn't really enjoy any hobbies. He is treated by a psychiatrist for major depression. Pt declined AVS.   I have personally reviewed and noted the following in the patients chart:   Medical and social history Use of alcohol, tobacco or illicit drugs  Current medications and supplements Functional ability and status Nutritional status Physical activity Advanced directives List of other physicians Hospitalizations, surgeries, and ER visits in previous 12 months Vitals Screenings to include cognitive, depression, and falls Referrals and appointments  In addition, I have reviewed and discussed with George Lukeertain preventive protocols, quality metrics, and best practice recommendations. A written personalized care plan for preventive services as well as general preventive health recommendations is available and can be mailed to the patient at his request.      RiRana Snare LPN  [id number]

## 2021-07-24 ENCOUNTER — Other Ambulatory Visit: Payer: Self-pay | Admitting: Family Medicine

## 2021-08-03 ENCOUNTER — Other Ambulatory Visit: Payer: Self-pay

## 2021-08-03 ENCOUNTER — Encounter: Payer: Self-pay | Admitting: Family Medicine

## 2021-08-03 ENCOUNTER — Ambulatory Visit (INDEPENDENT_AMBULATORY_CARE_PROVIDER_SITE_OTHER): Payer: Medicare Other | Admitting: Family Medicine

## 2021-08-03 VITALS — BP 133/69 | HR 98 | Ht 63.0 in | Wt 170.0 lb

## 2021-08-03 DIAGNOSIS — I10 Essential (primary) hypertension: Secondary | ICD-10-CM | POA: Diagnosis not present

## 2021-08-03 DIAGNOSIS — E1165 Type 2 diabetes mellitus with hyperglycemia: Secondary | ICD-10-CM

## 2021-08-03 DIAGNOSIS — Z23 Encounter for immunization: Secondary | ICD-10-CM | POA: Diagnosis not present

## 2021-08-03 DIAGNOSIS — E782 Mixed hyperlipidemia: Secondary | ICD-10-CM | POA: Diagnosis not present

## 2021-08-03 LAB — CBC WITH DIFFERENTIAL/PLATELET
Basophils Absolute: 0.1 10*3/uL (ref 0.0–0.2)
Basos: 1 %
EOS (ABSOLUTE): 0.3 10*3/uL (ref 0.0–0.4)
Eos: 4 %
Hematocrit: 37.4 % — ABNORMAL LOW (ref 37.5–51.0)
Hemoglobin: 12.7 g/dL — ABNORMAL LOW (ref 13.0–17.7)
Immature Grans (Abs): 0 10*3/uL (ref 0.0–0.1)
Immature Granulocytes: 0 %
Lymphocytes Absolute: 2.2 10*3/uL (ref 0.7–3.1)
Lymphs: 30 %
MCH: 33.8 pg — ABNORMAL HIGH (ref 26.6–33.0)
MCHC: 34 g/dL (ref 31.5–35.7)
MCV: 100 fL — ABNORMAL HIGH (ref 79–97)
Monocytes Absolute: 0.7 10*3/uL (ref 0.1–0.9)
Monocytes: 9 %
Neutrophils Absolute: 4.3 10*3/uL (ref 1.4–7.0)
Neutrophils: 56 %
Platelets: 257 10*3/uL (ref 150–450)
RBC: 3.76 x10E6/uL — ABNORMAL LOW (ref 4.14–5.80)
RDW: 11.9 % (ref 11.6–15.4)
WBC: 7.6 10*3/uL (ref 3.4–10.8)

## 2021-08-03 LAB — CMP14+EGFR
ALT: 16 IU/L (ref 0–44)
AST: 52 IU/L — ABNORMAL HIGH (ref 0–40)
Albumin/Globulin Ratio: 2.5 — ABNORMAL HIGH (ref 1.2–2.2)
Albumin: 4.5 g/dL (ref 3.7–4.7)
Alkaline Phosphatase: 77 IU/L (ref 44–121)
BUN/Creatinine Ratio: 16 (ref 10–24)
BUN: 17 mg/dL (ref 8–27)
Bilirubin Total: 0.4 mg/dL (ref 0.0–1.2)
CO2: 27 mmol/L (ref 20–29)
Calcium: 9.6 mg/dL (ref 8.6–10.2)
Chloride: 99 mmol/L (ref 96–106)
Creatinine, Ser: 1.07 mg/dL (ref 0.76–1.27)
Globulin, Total: 1.8 g/dL (ref 1.5–4.5)
Glucose: 135 mg/dL — ABNORMAL HIGH (ref 70–99)
Potassium: 3.9 mmol/L (ref 3.5–5.2)
Sodium: 143 mmol/L (ref 134–144)
Total Protein: 6.3 g/dL (ref 6.0–8.5)
eGFR: 71 mL/min/{1.73_m2} (ref >59)

## 2021-08-03 LAB — LIPID PANEL
Chol/HDL Ratio: 2.8 ratio (ref 0.0–5.0)
Cholesterol, Total: 117 mg/dL (ref 100–199)
HDL: 42 mg/dL (ref >39)
LDL Chol Calc (NIH): 56 mg/dL (ref 0–99)
Triglycerides: 98 mg/dL (ref 0–149)
VLDL Cholesterol Cal: 19 mg/dL (ref 5–40)

## 2021-08-03 LAB — BAYER DCA HB A1C WAIVED: HB A1C (BAYER DCA - WAIVED): 6.5 % — ABNORMAL HIGH (ref 4.8–5.6)

## 2021-08-03 NOTE — Progress Notes (Signed)
BP 133/69    Pulse 98    Ht 5' 3" (1.6 m)    Wt 170 lb (77.1 kg)    SpO2 100%    BMI 30.11 kg/m    Subjective:   Patient ID: George Wang, male    DOB: [birth year], 76 y.o.   MRN: [id number]  HPI: George Wang is a 76 y.o. male presenting on 08/03/2021 for Medical Management of Chronic Issues, Diabetes, and Hypertension   HPI Type 2 diabetes mellitus Patient comes in today for recheck of his diabetes. Patient has been currently taking metformin. Patient is currently on an ACE inhibitor/ARB. Patient has not seen an ophthalmologist this year. Patient denies any issues with their feet. The symptom started onset as an adult hypertension and hyperlipidemia ARE RELATED TO DM   Hypertension Patient is currently on amlodipine and chlorthalidone and losartan, and their blood pressure today is 133/69. Patient denies any lightheadedness or dizziness. Patient denies headaches, blurred vision, chest pains, shortness of breath, or weakness. Denies any side effects from medication and is content with current medication.   Hyperlipidemia Patient is coming in for recheck of his hyperlipidemia. The patient is currently taking atorvastatin. They deny any issues with myalgias or history of liver damage from it. They deny any focal numbness or weakness or chest pain.   Relevant past medical, surgical, family and social history reviewed and updated as indicated. Interim medical history since our last visit reviewed. Allergies and medications reviewed and updated.  Review of Systems  Constitutional:  Negative for chills and fever.  Eyes:  Negative for visual disturbance.  Respiratory:  Negative for shortness of breath and wheezing.   Cardiovascular:  Negative for chest pain and leg swelling.  Musculoskeletal:  Negative for back pain and gait problem.  Skin:  Negative for rash.  Neurological:  Negative for dizziness, weakness and light-headedness.  All other systems reviewed and are negative.  Per  HPI unless specifically indicated above   Allergies as of 08/03/2021   No Known Allergies      Medication List        Accurate as of August 03, 2021  9:59 AM. If you have any questions, ask your nurse or doctor.          acetaminophen 325 MG tablet Commonly known as: TYLENOL Take 650 mg by mouth every 6 (six) hours as needed for mild pain.   amLODipine 10 MG tablet Commonly known as: NORVASC Take 1 tablet (10 mg total) by mouth daily.   atorvastatin 80 MG tablet Commonly known as: LIPITOR TAKE 1/2 TABLET DAILY AT 6PM   beta carotene 25000 UNIT capsule Take 25,000 Units by mouth daily.   blood glucose meter kit and supplies Dispense based on patient and insurance preference. Use up to four times daily as directed. (FOR ICD-10 E10.9, E11.9).   busPIRone 15 MG tablet Commonly known as: BUSPAR 1  qam   2  qhs   CENTRUM SILVER PO Take 1 tablet by mouth daily.   chlorthalidone 25 MG tablet Commonly known as: HYGROTON Take 0.5 tablets (12.5 mg total) by mouth daily. for blood pressure   fish oil-omega-3 fatty acids 1000 MG capsule Take 1 g by mouth 2 (two) times daily.   LORazepam 0.5 MG tablet Commonly known as: Ativan 1  qam  1  Midday  2  qhs   losartan 100 MG tablet Commonly known as: COZAAR Take 1 tablet (100 mg total) by mouth daily.  metFORMIN 500 MG tablet Commonly known as: GLUCOPHAGE TAKE 2 TABLETS EACH MORNING AND 1 TABLETEACH EVENING   mirtazapine 15 MG disintegrating tablet Commonly known as: REMERON SOL-TAB Take one 15 mg tablet each morning.   mirtazapine 30 MG tablet Commonly known as: Remeron 1  qhs   omeprazole 20 MG capsule Commonly known as: PRILOSEC Take 1 capsule (20 mg total) by mouth daily.   OneTouch Ultra test strip Generic drug: glucose blood Test BS up to 4 times daily Dx E11.9   OT ULTRA/FASTTK CNTRL SOLN Soln USE WITH METER AS DIRECTED DX E11.9   Se-Tan PLUS 162-115.2-1 MG Caps Take 1 capsule by mouth 2 (two)  times daily.   temazepam 15 MG capsule Commonly known as: RESTORIL 2 qhs   venlafaxine XR 75 MG 24 hr capsule Commonly known as: Effexor XR 3  qam   Vitamin D 50 MCG (2000 UT) Caps Take 2 capsules by mouth daily.         Objective:   BP 133/69    Pulse 98    Ht 5' 3" (1.6 m)    Wt 170 lb (77.1 kg)    SpO2 100%    BMI 30.11 kg/m   Wt Readings from Last 3 Encounters:  08/03/21 170 lb (77.1 kg)  03/31/21 173 lb (78.5 kg)  02/11/21 179 lb (81.2 kg)    Physical Exam Vitals and nursing note reviewed.  Constitutional:      General: He is not in acute distress.    Appearance: He is well-developed. He is not diaphoretic.  Eyes:     General: No scleral icterus.    Conjunctiva/sclera: Conjunctivae normal.  Neck:     Thyroid: No thyromegaly.  Cardiovascular:     Rate and Rhythm: Normal rate and regular rhythm.     Heart sounds: Normal heart sounds. No murmur heard. Pulmonary:     Effort: Pulmonary effort is normal. No respiratory distress.     Breath sounds: Normal breath sounds. No wheezing.  Musculoskeletal:        General: Normal range of motion.     Cervical back: Neck supple.  Lymphadenopathy:     Cervical: No cervical adenopathy.  Skin:    General: Skin is warm and dry.     Findings: No rash.  Neurological:     Mental Status: He is alert and oriented to person, place, and time.     Coordination: Coordination normal.  Psychiatric:        Behavior: Behavior normal.    Assessment & Plan:   Problem List Items Addressed This Visit       Cardiovascular and Mediastinum   Essential hypertension   Relevant Orders   CBC with Differential/Platelet   CMP14+EGFR   Lipid panel     Endocrine   Type 2 diabetes mellitus ([place]) - Primary   Relevant Orders   Bayer DCA Hb A1c Waived   CBC with Differential/Platelet   CMP14+EGFR   Lipid panel     Other   Hyperlipemia   Relevant Orders   CBC with Differential/Platelet   CMP14+EGFR   Lipid panel   Other Visit  Diagnoses     Need for Tdap vaccination       Relevant Orders   Tdap vaccine greater than or equal to 7yo IM (Completed)   Need for shingles vaccine       Relevant Orders   Varicella-zoster vaccine IM (Shingrix) (Completed)      A1c 6-1/2, looks good.  No  changes, watch for the rest of blood work.  Follow up plan: Return in about 3 months (around 11/01/2021), or if symptoms worsen or fail to improve, for diabetes and htn and hld.  Counseling provided for all of the vaccine components Orders Placed This Encounter  Procedures   Varicella-zoster vaccine IM (Shingrix)   Tdap vaccine greater than or equal to 7yo IM   Bayer DCA Hb A1c Waived   CBC with Differential/Platelet   CMP14+EGFR   Lipid panel    Caryl Pina, MD [facility] 08/03/2021, 9:59 AM

## 2021-08-07 ENCOUNTER — Encounter: Payer: Self-pay | Admitting: Internal Medicine

## 2021-08-10 ENCOUNTER — Other Ambulatory Visit: Payer: Self-pay | Admitting: Family Medicine

## 2021-08-24 ENCOUNTER — Other Ambulatory Visit (HOSPITAL_COMMUNITY): Payer: Self-pay | Admitting: Psychiatry

## 2021-08-25 ENCOUNTER — Ambulatory Visit (HOSPITAL_BASED_OUTPATIENT_CLINIC_OR_DEPARTMENT_OTHER): Payer: Medicare Other | Admitting: Psychiatry

## 2021-08-25 ENCOUNTER — Other Ambulatory Visit: Payer: Self-pay

## 2021-08-25 DIAGNOSIS — F324 Major depressive disorder, single episode, in partial remission: Secondary | ICD-10-CM

## 2021-08-25 MED ORDER — BUSPIRONE HCL 15 MG PO TABS: ORAL_TABLET | ORAL | 5 refills | Status: DC

## 2021-08-25 MED ORDER — VENLAFAXINE HCL ER 75 MG PO CP24: ORAL_CAPSULE | ORAL | 5 refills | Status: DC

## 2021-08-25 MED ORDER — LORAZEPAM 0.5 MG PO TABS: ORAL_TABLET | ORAL | 2 refills | Status: DC

## 2021-08-25 MED ORDER — MIRTAZAPINE 15 MG PO TBDP: ORAL_TABLET | ORAL | 2 refills | Status: DC

## 2021-08-25 MED ORDER — MIRTAZAPINE 30 MG PO TABS: ORAL_TABLET | ORAL | 3 refills | Status: DC

## 2021-08-25 NOTE — Progress Notes (Signed)
Psychiatric Initial Adult Assessment   Patient Identification: George Wang MRN:  [id number] Date of Evaluation:  08/25/2021 Referral Source: Community/emergency room Chief Complaint:   Visit Diagnosis: Major depression  History of Present Illness:    Today the patient is seen with his wife.  I believe the patient is at his baseline.  The patient describes some mild episodic depression.  He says that mornings he wakes up when he has nothing to do he feels depressed.  The depression essentially goes away after much and during the afternoon.  I really would not describe it is persistent and pervasive.  The patient however is interested in [place].  Today the patient has a hard time describing if he feels depressed in the morning or anxious.  He sleeps a normal amount of time.  He is eating well.  His energy level is fair.  He denies any use of alcohol or drugs.  He is actually functioning quite well.  He is very well groomed.  The patient continues in one-to-one therapy but it is very rare.  The patient is not suicidal.  The patient says when he works is actually a good day.  He likes going to work but on the other hand he tells his son that he should sell the company.  It is time to go off and do something else.  I think the patient would have a difficult time retiring although he disagrees with.  He says he has a lot of other things to do around work at home.  I am not clear he really does not when he has the time.  Importantly the patient cannot distinguish between being anxious or being depressed.  I suspect it is more likely that he is anxious and has a lot of empty time.  I suspect that might make him anxious.   Depression Symptoms:  depressed mood, (Hypo) Manic Symptoms:   Anxiety Symptoms:  Excessive Worry, Psychotic Symptoms:   PTSD Symptoms:   Past Psychiatric History: 1 psychiatric hospitalization in 1989 presently on Lexapro 10 mg, BuSpar 30 mg  Previous Psychotropic Medications: Yes    Substance Abuse History in the last 12 months:  No.  Consequences of Substance Abuse:   Past Medical History:  Past Medical History:  Diagnosis Date   Allergy    mild   Anemia    Iron deficiency   Anxiety state, unspecified    Aortic atherosclerosis ([place])    Arthritis    Calculus of kidney    kidney stones  - normal BMP    Cancer (HCC)    Prostate, skin cancer x 2    Cataract    forming both eyes   Depressive disorder, not elsewhere classified    Diverticulosis of colon (without mention of hemorrhage)    GERD (gastroesophageal reflux disease)    on nexium   Gout, unspecified    Heart murmur    Hyperlipidemia    Hypertension, essential    Irritable bowel syndrome    Neuromuscular disorder (HCC)    numbness in knees from back issues    Personal history of malignant neoplasm of prostate    PVC (premature ventricular contraction)    Type II or unspecified type diabetes mellitus without mention of complication, not stated as uncontrolled     Past Surgical History:  Procedure Laterality Date   COLONOSCOPY     HERNIA REPAIR     left side   PROSTATE SURGERY     skin cancer removed  chest x 2 3-4 yrs ago    TONSILLECTOMY      Family Psychiatric History:   Family History:  Family History  Problem Relation Age of Onset   Cancer Maternal Aunt        breast   Pancreatic cancer Paternal Aunt    Prostate cancer Paternal Uncle    Heart disease Maternal Grandmother 14   Heart disease Maternal Grandfather        fluid / CHF    Heart disease Paternal Grandfather 37   Heart attack Paternal Grandfather        passed at 72   Prostate cancer Father    Coronary artery disease Father 62       CABG   Hypertension Mother    Hyperlipidemia Mother    Other Daughter        accident with tornado 1996/10/08   Hypertension Son    Colon cancer Neg Hx    Colon polyps Neg Hx    Esophageal cancer Neg Hx    Rectal cancer Neg Hx    Stomach cancer Neg Hx     Social History:    Social History   Socioeconomic History   Marital status: Married    Spouse name: [name]   Number of children: 2   Years of education: 84   Highest education level: Some college, no degree  Occupational History   Occupation: Production designer, theatre/television/film    Comment: Part time  Tobacco Use   Smoking status: Former    Packs/day: 1.00    Years: 19.00    Pack years: 19.00    Types: Cigarettes    Quit date: 10/13/1978    Years since quitting: 42.8   Smokeless tobacco: Former    Types: Snuff    Quit date: 05/30/2008  Vaping Use   Vaping Use: Never used  Substance and Sexual Activity   Alcohol use: Not Currently   Drug use: No   Sexual activity: Yes  Other Topics Concern   Not on file  Social History Narrative   Lives at home with wife.  He has one son and a daughter that died in a tornado in 48. 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 He has one living grandson and one grandson that passed away in 2016/10/08 at age 43 from cancer.   Social Determinants of Health   Financial Resource Strain: Low Risk    Difficulty of Paying Living Expenses: Not hard at all  Food Insecurity: No Food Insecurity   Worried About Charity fundraiser in the Last Year: Never true   [place] in the Last Year: Never true  Transportation Needs: No Transportation Needs   Lack of Transportation (Medical): No   Lack of Transportation (Non-Medical): No  Physical Activity: Inactive   Days of Exercise per Week: 0 days   Minutes of Exercise per Session: 0 min  Stress: No Stress Concern Present   Feeling of Stress : Only a little  Social Connections: Engineer, building services of Communication with Friends and Family: More than three times a week   Frequency of Social Gatherings with Friends and Family: Once a week   Attends Religious Services: More than 4 times per year   Active Member of [employer] or Organizations: Yes   Attends Archivist Meetings: 1 to 4 times per year    Marital Status: Married    Additional Social History:   Allergies:  No Known Allergies  Metabolic Disorder Labs: Lab Results  Component Value Date   HGBA1C 6.5 (H) 08/03/2021   No results found for: PROLACTIN Lab Results  Component Value Date   CHOL 117 08/03/2021   TRIG 98 08/03/2021   HDL 42 08/03/2021   CHOLHDL 2.8 08/03/2021   LDLCALC 56 08/03/2021   LDLCALC 45 03/31/2021   Lab Results  Component Value Date   TSH 1.330 12/05/2018    Therapeutic Level Labs: No results found for: LITHIUM No results found for: CBMZ No results found for: VALPROATE  Current Medications: Current Outpatient Medications  Medication Sig Dispense Refill   acetaminophen (TYLENOL) 325 MG tablet Take 650 mg by mouth every 6 (six) hours as needed for mild pain.     amLODipine (NORVASC) 10 MG tablet Take 1 tablet (10 mg total) by mouth daily. 90 tablet 3   atorvastatin (LIPITOR) 80 MG tablet TAKE 1/2 TABLET DAILY AT 6PM 45 tablet 0   beta carotene 25000 UNIT capsule Take 25,000 Units by mouth daily.     Blood Glucose Calibration (OT ULTRA/FASTTK CNTRL SOLN) SOLN USE WITH METER AS DIRECTED DX E11.9 1 each 1   blood glucose meter kit and supplies Dispense based on patient and insurance preference. Use up to four times daily as directed. (FOR ICD-10 E10.9, E11.9). 1 each 1   busPIRone (BUSPAR) 15 MG tablet 2 qam   2  qhs 120 tablet 5   chlorthalidone (HYGROTON) 25 MG tablet TAKE 1/2 TABLET BY MOUTH ONCE DAILY 45 tablet 0   Cholecalciferol (VITAMIN D) 2000 UNITS CAPS Take 2 capsules by mouth daily.      FeFum-FePo-FA-B Cmp-C-Zn-Mn-Cu (SE-TAN PLUS) 162-115.2-1 MG CAPS Take 1 capsule by mouth 2 (two) times daily. 180 capsule 3   fish oil-omega-3 fatty acids 1000 MG capsule Take 1 g by mouth 2 (two) times daily.      glucose blood (ONETOUCH ULTRA) test strip Test BS up to 4 times daily Dx E11.9 400 strip 3   LORazepam (ATIVAN) 0.5 MG tablet 1  qam  1  Midday  2  qhs 120 tablet 2   losartan (COZAAR) 100  MG tablet Take 1 tablet (100 mg total) by mouth daily. 90 tablet 3   metFORMIN (GLUCOPHAGE) 500 MG tablet TAKE 2 TABLETS EACH MORNING AND 1 TABLETEACH EVENING 270 tablet 0   mirtazapine (REMERON SOL-TAB) 15 MG disintegrating tablet Take one 15 mg tablet each morning. 30 tablet 2   mirtazapine (REMERON) 30 MG tablet 1  qhs 30 tablet 3   Multiple Vitamins-Minerals (CENTRUM SILVER PO) Take 1 tablet by mouth daily.      omeprazole (PRILOSEC) 20 MG capsule Take 1 capsule (20 mg total) by mouth daily. 90 capsule 3   temazepam (RESTORIL) 15 MG capsule 2 qhs 60 capsule 2   venlafaxine XR (EFFEXOR XR) 75 MG 24 hr capsule 3  qam 90 capsule 5   No current facility-administered medications for this visit.    Musculoskeletal: Strength & Muscle Tone: within normal limits Gait & Station: normal Patient leans: N/A  Psychiatric Specialty Exam: Review of Systems  There were no vitals taken for this visit.There is no height or weight on file to calculate BMI.  General Appearance: Casual  Eye Contact:  Good  Speech:  Clear and Coherent  Volume:  Normal  Mood:  Depressed  Affect:  Appropriate  Thought Process:  Coherent  Orientation:  Full (Time, Place, and Person)  Thought Content:  WDL  Suicidal Thoughts:  No  Homicidal  Thoughts:  No  Memory:  Negative  Judgement:  Good  Insight:  Good  Psychomotor Activity:  Normal  Concentration:    Recall:  Good  Fund of Knowledge:Fair  Language: Good  Akathisia:  No  Handed:  Right  AIMS (if indicated):  not done  Assets:  Desire for Improvement  ADL's:  Intact  Cognition: WNL  Sleep:  Good   Screenings: GAD-7    Flowsheet Row Office Visit from 08/03/2021 in [place] [place] Visit from 03/31/2021 in [facility] Office Visit from 02/11/2021 in [place] Office Visit from 11/27/2020 in [facility] Office Visit from 03/27/2020 in [place]  Total GAD-7 Score 10 11 11 6 4       Mini-Mental    [place] from 05/16/2017 in [facility] [place]  Total Score (max 30 points ) 29      PHQ2-9    [place] Visit from 08/03/2021 in [facility] from 07/15/2021 in [facility] Visit from 03/31/2021 in [place] Office Visit from 02/11/2021 in [facility] Office Visit from 11/27/2020 in [facility]  PHQ-2 Total Score 3 6 4 4 2   PHQ-9 Total Score 7 15 11 11 5        Assessment and Plan:  2/7/20231:57 PM   This patient has 2 problems.  The first is that he has major clinical depression.  He will continue taking Remeron 15 mg in the morning and 30 mg at night.  We will also continue Effexor in the morning.  His second problem is adjustment disorder with an anxious mood state.  Today we will increase his BuSpar taking 15 mg 2 in the morning and 2 at night.  The Ativan he takes approximately 1 3 times a day with 1 extra if he needs it.  The patient will continue in one-to-one therapy.  His third problem is that of insomnia.  He will continue taking Restoril for this condition.  He will return to see me in 2 to 3 months after he has a colonoscopy.  Then will we discussed the possibility of [place] treatment.

## 2021-08-26 ENCOUNTER — Other Ambulatory Visit: Payer: Medicare Other

## 2021-08-26 DIAGNOSIS — Z8546 Personal history of malignant neoplasm of prostate: Secondary | ICD-10-CM

## 2021-08-27 ENCOUNTER — Other Ambulatory Visit: Payer: Medicare Other

## 2021-08-27 LAB — PSA: Prostate Specific Ag, Serum: <0.1 ng/mL (ref 0.0–4.0)

## 2021-09-01 ENCOUNTER — Ambulatory Visit (HOSPITAL_COMMUNITY): Payer: Medicare Other | Admitting: Psychiatry

## 2021-09-02 ENCOUNTER — Telehealth: Payer: Self-pay | Admitting: *Deleted

## 2021-09-02 NOTE — Telephone Encounter (Signed)
Dr.Pyrtle,  This patient is 76 y.o. he is scheduled for a recall colon with you at [facility]. Last colon 06/20/18, polyps (T.A. & SSP) with a 3 year recall, I do not see a recall assessment sheet. Okay to proceed with the colonoscopy as scheduled? Please advise. Thank you, Kem Hensen pv

## 2021-09-03 ENCOUNTER — Ambulatory Visit: Payer: Medicare Other | Admitting: Urology

## 2021-09-03 ENCOUNTER — Other Ambulatory Visit: Payer: Self-pay | Admitting: Family Medicine

## 2021-09-03 DIAGNOSIS — I10 Essential (primary) hypertension: Secondary | ICD-10-CM

## 2021-09-03 NOTE — Telephone Encounter (Signed)
Yes, 3 yr recall was recommended after Dec 2019 colon thus okay to procedure with surveillance colon Thanks JMP

## 2021-09-07 ENCOUNTER — Ambulatory Visit (AMBULATORY_SURGERY_CENTER): Payer: Medicare Other | Admitting: *Deleted

## 2021-09-07 ENCOUNTER — Other Ambulatory Visit: Payer: Self-pay

## 2021-09-07 VITALS — Ht 63.0 in | Wt 170.0 lb

## 2021-09-07 DIAGNOSIS — Z86010 Personal history of colonic polyps: Secondary | ICD-10-CM

## 2021-09-07 MED ORDER — NA SULFATE-K SULFATE-MG SULF 17.5-3.13-1.6 GM/177ML PO SOLN: 1.0000 | Freq: Once | ORAL | 0 refills | Status: AC

## 2021-09-07 NOTE — Progress Notes (Signed)
No egg or soy allergy known to patient  No issues known to pt with past sedation with any surgeries or procedures Patient denies ever being told they had issues or difficulty with intubation  No FH of Malignant Hyperthermia Pt is not on diet pills Pt is not on  home 02  Pt is not on blood thinners  Pt denies issues with constipation  No A fib or A flutter  Pt is fully vaccinated  for Covid    NO PA's for preps discussed with pt In PV today  Discussed with pt there will be an out-of-pocket cost for prep and that varies from $0 to 70 +  dollars - pt verbalized understanding   Due to the COVID-19 pandemic we are asking patients to follow certain guidelines in PV and the [place]   Pt aware of COVID protocols and [facility] guidelines   PV completed over the phone. Pt verified name, DOB, address and insurance during PV today.  Pt mailed instruction packet with copy of consent form to read and not return, and instructions.  Pt encouraged to call with questions or issues.  If pt has My chart, procedure instructions sent via My Chart

## 2021-09-14 ENCOUNTER — Encounter: Payer: Self-pay | Admitting: Internal Medicine

## 2021-09-17 ENCOUNTER — Ambulatory Visit (AMBULATORY_SURGERY_CENTER): Payer: Medicare Other | Admitting: Internal Medicine

## 2021-09-17 ENCOUNTER — Encounter: Payer: Self-pay | Admitting: Internal Medicine

## 2021-09-17 VITALS — BP 119/68 | HR 83 | Temp 97.1°F | Resp 14 | Ht 63.0 in | Wt 170.0 lb

## 2021-09-17 DIAGNOSIS — D122 Benign neoplasm of ascending colon: Secondary | ICD-10-CM | POA: Diagnosis not present

## 2021-09-17 DIAGNOSIS — D123 Benign neoplasm of transverse colon: Secondary | ICD-10-CM

## 2021-09-17 DIAGNOSIS — Z86010 Personal history of colonic polyps: Secondary | ICD-10-CM | POA: Diagnosis not present

## 2021-09-17 MED ORDER — SODIUM CHLORIDE 0.9 % IV SOLN: 500.0000 mL | INTRAVENOUS | Status: DC

## 2021-09-17 NOTE — Progress Notes (Signed)
PT taken to PACU. Monitors in place. VSS. Report given to RN. 

## 2021-09-17 NOTE — Op Note (Signed)
[place] ?Patient Name: George Wang ?Procedure Date: 09/17/2021 2:12 PM ?MRN: [id number] ?Endoscopist: Jerene Bears , MD ?Age: 76 ?Referring MD:  ?Date of Birth: 07-Aug-1945 ?Gender: Male ?Account #: [id number] ?Procedure:                Colonoscopy ?Indications:              High risk colon cancer surveillance: Personal  ?                          history of sessile serrated colon polyps (less than  ?                          10 mm in size) with no dysplasia and tubular  ?                          adenomas, Last colonoscopy: December 2019 ?Medicines:                Monitored Anesthesia Care ?Procedure:                Pre-Anesthesia Assessment: ?                          - Prior to the procedure, a History and Physical  ?                          was performed, and patient medications and  ?                          allergies were reviewed. The patient's tolerance of  ?                          previous anesthesia was also reviewed. The risks  ?                          and benefits of the procedure and the sedation  ?                          options and risks were discussed with the patient.  ?                          All questions were answered, and informed consent  ?                          was obtained. Prior Anticoagulants: The patient has  ?                          taken no previous anticoagulant or antiplatelet  ?                          agents. ASA Grade Assessment: II - A patient with  ?                          mild systemic disease. After reviewing the risks  ?  and benefits, the patient was deemed in  ?                          satisfactory condition to undergo the procedure. ?                          After obtaining informed consent, the colonoscope  ?                          was passed under direct vision. Throughout the  ?                          procedure, the patient's blood pressure, pulse, and  ?                          oxygen saturations were  monitored continuously. The  ?                          Colonoscope was introduced through the anus and  ?                          advanced to the cecum, identified by appendiceal  ?                          orifice and ileocecal valve. The colonoscopy was  ?                          performed without difficulty. The patient tolerated  ?                          the procedure well. The quality of the bowel  ?                          preparation was good. The ileocecal valve,  ?                          appendiceal orifice, and rectum were photographed. ?Scope In: 3:02:51 PM ?Scope Out: 3:22:50 PM ?Scope Withdrawal Time: 0 hours 11 minutes 47 seconds  ?Total Procedure Duration: 0 hours 19 minutes 59 seconds  ?Findings:                 The digital rectal exam was normal. ?                          Two sessile polyps were found in the ascending  ?                          colon. The polyps were 3 to 4 mm in size. These  ?                          polyps were removed with a cold snare. Resection  ?                          and retrieval were complete. ?  Two sessile polyps were found in the transverse  ?                          colon. The polyps were 4 to 6 mm in size. These  ?                          polyps were removed with a cold snare. Resection  ?                          and retrieval were complete. ?                          Multiple small and large-mouthed diverticula were  ?                          found in the sigmoid colon and descending colon. ?                          Internal hemorrhoids were found during  ?                          retroflexion. The hemorrhoids were small. ?Complications:            No immediate complications. ?Estimated Blood Loss:     Estimated blood loss was minimal. ?Impression:               - Two 3 to 4 mm polyps in the ascending colon,  ?                          removed with a cold snare. Resected and retrieved. ?                          - Two 4 to 6  mm polyps in the transverse colon,  ?                          removed with a cold snare. Resected and retrieved. ?                          - Severe diverticulosis in the sigmoid colon and in  ?                          the descending colon. ?                          - Internal hemorrhoids. ?Recommendation:           - Patient has a contact number available for  ?                          emergencies. The signs and symptoms of potential  ?                          delayed complications were discussed with the  ?  patient. Return to normal activities tomorrow.  ?                          Written discharge instructions were provided to the  ?                          patient. ?                          - Resume previous diet. ?                          - Continue present medications. ?                          - Await pathology results. ?                          - No recommendation at this time regarding repeat  ?                          colonoscopy due to age at next surveillance  ?                          interval. ?Jerene Bears, MD ?09/17/2021 3:26:37 PM ?This report has been signed electronically. ?

## 2021-09-17 NOTE — Progress Notes (Signed)
GASTROENTEROLOGY PROCEDURE H&P NOTE   Primary Care Physician: Dettinger, Elige Radon, MD    Reason for Procedure:  History of adenomatous and sessile serrated polyps  Plan:    Colonoscopy  Patient is appropriate for endoscopic procedure(s) in the ambulatory ([facility]) setting.  The nature of the procedure, as well as the risks, benefits, and alternatives were carefully and thoroughly reviewed with the patient. Ample time for discussion and questions allowed. The patient understood, was satisfied, and agreed to proceed.     HPI: George Wang is a 76 y.o. male who presents for surveillance colonoscopy.  Medical history as below.  Tolerated the prep.  No recent chest pain or shortness of breath.  No abdominal pain today.  Past Medical History:  Diagnosis Date   Allergy    mild   Anemia    Iron deficiency   Anxiety state, unspecified    Aortic atherosclerosis (HCC)    Arthritis    Calculus of kidney    kidney stones  - normal BMP    Cancer (HCC)    Prostate, skin cancer x 2    Cataract    forming both eyes   Depressive disorder, not elsewhere classified    Diverticulosis of colon (without mention of hemorrhage)    GERD (gastroesophageal reflux disease)    on nexium   Gout, unspecified    Heart murmur    Hyperlipidemia    Hypertension, essential    Irritable bowel syndrome    Neuromuscular disorder (HCC)    numbness in knees from back issues    Personal history of malignant neoplasm of prostate    PVC (premature ventricular contraction)    Type II or unspecified type diabetes mellitus without mention of complication, not stated as uncontrolled     Past Surgical History:  Procedure Laterality Date   COLONOSCOPY     HERNIA REPAIR     left side   POLYPECTOMY     PROSTATE SURGERY     skin cancer removed     chest x 2 3-4 yrs ago    TONSILLECTOMY      Prior to Admission medications   Medication Sig Start Date End Date Taking? Authorizing Provider  amLODipine  (NORVASC) 10 MG tablet Take 1 tablet (10 mg total) by mouth daily. 07/28/20  Yes Dettinger, Elige Radon, MD  atorvastatin (LIPITOR) 80 MG tablet TAKE 1/2 TABLET DAILY AT 6PM 07/08/21  Yes Dettinger, Elige Radon, MD  beta carotene 16109 UNIT capsule Take 25,000 Units by mouth daily.   Yes [provider]  Blood Glucose Calibration (OT ULTRA/FASTTK CNTRL SOLN) SOLN USE WITH METER AS DIRECTED DX E11.9 02/16/21  Yes Dettinger, Elige Radon, MD  blood glucose meter kit and supplies Dispense based on patient and insurance preference. Use up to four times daily as directed. (FOR ICD-10 E10.9, E11.9). 10/05/17  Yes Ernestina Penna, MD  busPIRone (BUSPAR) 15 MG tablet 2 qam   2  qhs 08/25/21  Yes Plovsky, Earvin Hansen, MD  chlorthalidone (HYGROTON) 25 MG tablet TAKE 1/2 TABLET BY MOUTH ONCE DAILY 08/10/21  Yes Dettinger, Elige Radon, MD  Cholecalciferol (VITAMIN D) 2000 UNITS CAPS Take 1 capsule by mouth daily.   Yes [provider]  fish oil-omega-3 fatty acids 1000 MG capsule Take 1 g by mouth 2 (two) times daily.    Yes [provider]  glucose blood (ONETOUCH ULTRA) test strip Test BS up to 4 times daily Dx E11.9 07/24/21  Yes Dettinger, Elige Radon, MD  LORazepam (ATIVAN) 0.5 MG tablet 1  qam  1  Midday  2  qhs 08/25/21  Yes Plovsky, Earvin Hansen, MD  losartan (COZAAR) 100 MG tablet TAKE ONE (1) TABLET EACH DAY 09/03/21  Yes Dettinger, Elige Radon, MD  metFORMIN (GLUCOPHAGE) 500 MG tablet TAKE 2 TABLETS EACH MORNING AND 1 TABLETEACH EVENING 07/15/21  Yes Dettinger, Elige Radon, MD  mirtazapine (REMERON SOL-TAB) 15 MG disintegrating tablet Take one 15 mg tablet each morning. 08/25/21  Yes Plovsky, Earvin Hansen, MD  mirtazapine (REMERON) 30 MG tablet 1  qhs 08/25/21  Yes Plovsky, Earvin Hansen, MD  Multiple Vitamins-Minerals (CENTRUM SILVER PO) Take 1 tablet by mouth daily.    Yes [provider]  omeprazole (PRILOSEC) 20 MG capsule Take 1 capsule (20 mg total) by mouth daily. 11/27/20  Yes Dettinger, Elige Radon, MD  temazepam  (RESTORIL) 15 MG capsule 2 qhs Patient taking differently: 1 qhs- can take 2 per pt 06/02/21  Yes Plovsky, Earvin Hansen, MD  venlafaxine XR (EFFEXOR XR) 75 MG 24 hr capsule 3  qam 08/25/21  Yes Plovsky, Earvin Hansen, MD  acetaminophen (TYLENOL) 325 MG tablet Take 650 mg by mouth every 6 (six) hours as needed for mild pain.    [provider]  FeFum-FePo-FA-B Cmp-C-Zn-Mn-Cu (SE-TAN PLUS) 162-115.2-1 MG CAPS Take 1 capsule by mouth 2 (two) times daily. 03/31/21   Dettinger, Elige Radon, MD  febuxostat (ULORIC) 40 MG tablet Take 80 mg by mouth daily.  10/13/11  [provider]  simvastatin (ZOCOR) 80 MG tablet Take 80 mg by mouth at bedtime.  07/03/18  [provider]    Current Outpatient Medications  Medication Sig Dispense Refill   amLODipine (NORVASC) 10 MG tablet Take 1 tablet (10 mg total) by mouth daily. 90 tablet 3   atorvastatin (LIPITOR) 80 MG tablet TAKE 1/2 TABLET DAILY AT 6PM 45 tablet 0   beta carotene 13086 UNIT capsule Take 25,000 Units by mouth daily.     Blood Glucose Calibration (OT ULTRA/FASTTK CNTRL SOLN) SOLN USE WITH METER AS DIRECTED DX E11.9 1 each 1   blood glucose meter kit and supplies Dispense based on patient and insurance preference. Use up to four times daily as directed. (FOR ICD-10 E10.9, E11.9). 1 each 1   busPIRone (BUSPAR) 15 MG tablet 2 qam   2  qhs 120 tablet 5   chlorthalidone (HYGROTON) 25 MG tablet TAKE 1/2 TABLET BY MOUTH ONCE DAILY 45 tablet 0   Cholecalciferol (VITAMIN D) 2000 UNITS CAPS Take 1 capsule by mouth daily.     fish oil-omega-3 fatty acids 1000 MG capsule Take 1 g by mouth 2 (two) times daily.      glucose blood (ONETOUCH ULTRA) test strip Test BS up to 4 times daily Dx E11.9 400 strip 3   LORazepam (ATIVAN) 0.5 MG tablet 1  qam  1  Midday  2  qhs 120 tablet 2   losartan (COZAAR) 100 MG tablet TAKE ONE (1) TABLET EACH DAY 90 tablet 0   metFORMIN (GLUCOPHAGE) 500 MG tablet TAKE 2 TABLETS EACH MORNING AND 1 TABLETEACH EVENING 270  tablet 0   mirtazapine (REMERON SOL-TAB) 15 MG disintegrating tablet Take one 15 mg tablet each morning. 30 tablet 2   mirtazapine (REMERON) 30 MG tablet 1  qhs 30 tablet 3   Multiple Vitamins-Minerals (CENTRUM SILVER PO) Take 1 tablet by mouth daily.      omeprazole (PRILOSEC) 20 MG capsule Take 1 capsule (20 mg total) by mouth daily. 90 capsule 3   temazepam (  RESTORIL) 15 MG capsule 2 qhs (Patient taking differently: 1 qhs- can take 2 per pt) 60 capsule 2   venlafaxine XR (EFFEXOR XR) 75 MG 24 hr capsule 3  qam 90 capsule 5   acetaminophen (TYLENOL) 325 MG tablet Take 650 mg by mouth every 6 (six) hours as needed for mild pain.     FeFum-FePo-FA-B Cmp-C-Zn-Mn-Cu (SE-TAN PLUS) 162-115.2-1 MG CAPS Take 1 capsule by mouth 2 (two) times daily. 180 capsule 3   Current Facility-Administered Medications  Medication Dose Route Frequency Provider Last Rate Last Admin   0.9 %  sodium chloride infusion  500 mL Intravenous Continuous Angla Delahunt, Carie Caddy, MD        Allergies as of 09/17/2021   (No Known Allergies)    Family History  Problem Relation Age of Onset   Cancer Maternal Aunt        breast   Pancreatic cancer Paternal Aunt    Prostate cancer Paternal Uncle    Heart disease Maternal Grandmother 76   Heart disease Maternal Grandfather        fluid / CHF    Heart disease Paternal Grandfather 8   Heart attack Paternal Grandfather        passed at 20   Prostate cancer Father    Coronary artery disease Father 81       CABG   Hypertension Mother    Hyperlipidemia Mother    Other Daughter        accident with tornado 1996/08/18   Hypertension Son    Colon cancer Neg Hx    Colon polyps Neg Hx    Esophageal cancer Neg Hx    Rectal cancer Neg Hx    Stomach cancer Neg Hx     Social History   Socioeconomic History   Marital status: Married    Spouse name: Harriett Sine   Number of children: 2   Years of education: 13   Highest education level: Some college, no degree  Occupational History    Occupation: Charity fundraiser    Comment: Part time  Tobacco Use   Smoking status: Former    Packs/day: 1.00    Years: 19.00    Pack years: 19.00    Types: Cigarettes    Quit date: 10/13/1978    Years since quitting: 42.9   Smokeless tobacco: Former    Types: Snuff    Quit date: 05/30/2008  Vaping Use   Vaping Use: Never used  Substance and Sexual Activity   Alcohol use: Not Currently   Drug use: No   Sexual activity: Yes  Other Topics Concern   Not on file  Social History Narrative   Lives at home with wife.  He has one son and a daughter that died in a tornado in 40. 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 He has one living grandson and one grandson that passed away in 2016/08/18 at age 69 from cancer.   Social Determinants of Health   Financial Resource Strain: Low Risk    Difficulty of Paying Living Expenses: Not hard at all  Food Insecurity: No Food Insecurity   Worried About Programme researcher, broadcasting/film/video in the Last Year: Never true   Ran Out of Food in the Last Year: Never true  Transportation Needs: No Transportation Needs   Lack of Transportation (Medical): No   Lack of Transportation (Non-Medical): No  Physical Activity: Inactive   Days of Exercise per Week: 0 days   Minutes of Exercise  per Session: 0 min  Stress: No Stress Concern Present   Feeling of Stress : Only a little  Social Connections: Press photographer of Communication with Friends and Family: More than three times a week   Frequency of Social Gatherings with Friends and Family: Once a week   Attends Religious Services: More than 4 times per year   Active Member of [employer] or Organizations: Yes   Attends Banker Meetings: 1 to 4 times per year   Marital Status: Married  Catering manager Violence: Not on file    Physical Exam: Vital signs in last 24 hours: @BP  (!) 142/67   Pulse 96   Temp (!) 97.1 F (36.2 C)   Ht 5\' 3"  (1.6 m)   Wt 170 lb (77.1 kg)   SpO2  98%   BMI 30.11 kg/m  GEN: NAD EYE: Sclerae anicteric ENT: MMM CV: Non-tachycardic Pulm: CTA b/l GI: Soft, NT/ND NEURO:  Alert & Oriented x 3   Erick Blinks, MD [facility]  09/17/2021 2:56 PM

## 2021-09-17 NOTE — Patient Instructions (Signed)
Handout on polyps, diverticulosis and hemorrhoids given. ? ?Await pathology results. ? ?YOU HAD AN ENDOSCOPIC PROCEDURE TODAY AT [facility]:   Refer to the procedure report that was given to you for any specific questions about what was found during the examination.  If the procedure report does not answer your questions, please call your gastroenterologist to clarify.  If you requested that your care partner not be given the details of your procedure findings, then the procedure report has been included in a sealed envelope for you to review at your convenience later. ? ?YOU SHOULD EXPECT: Some feelings of bloating in the abdomen. Passage of more gas than usual.  Walking can help get rid of the air that was put into your GI tract during the procedure and reduce the bloating. If you had a lower endoscopy (such as a colonoscopy or flexible sigmoidoscopy) you may notice spotting of blood in your stool or on the toilet paper. If you underwent a bowel prep for your procedure, you may not have a normal bowel movement for a few days. ? ?Please Note:  You might notice some irritation and congestion in your nose or some drainage.  This is from the oxygen used during your procedure.  There is no need for concern and it should clear up in a day or so. ? ?SYMPTOMS TO REPORT IMMEDIATELY: ? ?Following lower endoscopy (colonoscopy or flexible sigmoidoscopy): ? Excessive amounts of blood in the stool ? Significant tenderness or worsening of abdominal pains ? Swelling of the abdomen that is new, acute ? Fever of 100?F or higher ? ? ?For urgent or emergent issues, a gastroenterologist can be reached at any hour by calling [phone]. ?Do not use MyChart messaging for urgent concerns.  ? ? ?DIET:  We do recommend a small meal at first, but then you may proceed to your regular diet.  Drink plenty of fluids but you should avoid alcoholic beverages for 24 hours. ? ?ACTIVITY:  You should plan to take it easy for the  rest of today and you should NOT DRIVE or use heavy machinery until tomorrow (because of the sedation medicines used during the test).   ? ?FOLLOW UP: ?Our staff will call the number listed on your records 48-72 hours following your procedure to check on you and address any questions or concerns that you may have regarding the information given to you following your procedure. If we do not reach you, we will leave a message.  We will attempt to reach you two times.  During this call, we will ask if you have developed any symptoms of COVID 19. If you develop any symptoms (ie: fever, flu-like symptoms, shortness of breath, cough etc.) before then, please call [phone].  If you test positive for Covid 19 in the 2 weeks post procedure, please call and report this information to [place].   ? ?If any biopsies were taken you will be contacted by phone or by letter within the next 1-3 weeks.  Please call us at [phone] if you have not heard about the biopsies in 3 weeks.  ? ? ?SIGNATURES/CONFIDENTIALITY: ?You and/or your care partner have signed paperwork which will be entered into your electronic medical record.  These signatures attest to the fact that that the information above on your After Visit Summary has been reviewed and is understood.  Full responsibility of the confidentiality of this discharge information lies with you and/or your care-partner.  ?

## 2021-09-17 NOTE — Progress Notes (Signed)
C.W. vital signs. 

## 2021-09-21 ENCOUNTER — Telehealth: Payer: Self-pay | Admitting: *Deleted

## 2021-09-21 NOTE — Telephone Encounter (Signed)
No answer on second attempt follow up call.  ? ?

## 2021-09-21 NOTE — Telephone Encounter (Signed)
First attempt, left VM.  

## 2021-09-24 ENCOUNTER — Encounter: Payer: Self-pay | Admitting: Internal Medicine

## 2021-10-01 ENCOUNTER — Other Ambulatory Visit: Payer: Self-pay

## 2021-10-01 ENCOUNTER — Encounter: Payer: Self-pay | Admitting: Urology

## 2021-10-01 ENCOUNTER — Ambulatory Visit (INDEPENDENT_AMBULATORY_CARE_PROVIDER_SITE_OTHER): Payer: Medicare Other | Admitting: Urology

## 2021-10-01 VITALS — BP 148/71 | HR 92

## 2021-10-01 DIAGNOSIS — Z87442 Personal history of urinary calculi: Secondary | ICD-10-CM | POA: Diagnosis not present

## 2021-10-01 DIAGNOSIS — Z8546 Personal history of malignant neoplasm of prostate: Secondary | ICD-10-CM | POA: Diagnosis not present

## 2021-10-01 DIAGNOSIS — N393 Stress incontinence (female) (male): Secondary | ICD-10-CM | POA: Diagnosis not present

## 2021-10-01 DIAGNOSIS — E291 Testicular hypofunction: Secondary | ICD-10-CM | POA: Diagnosis not present

## 2021-10-01 LAB — URINALYSIS, ROUTINE W REFLEX MICROSCOPIC
Bilirubin, UA: NEGATIVE
Glucose, UA: NEGATIVE
Leukocytes,UA: NEGATIVE
Nitrite, UA: NEGATIVE
Protein,UA: NEGATIVE
RBC, UA: NEGATIVE
Specific Gravity, UA: 1.02 (ref 1.005–1.030)
Urobilinogen, Ur: 1 mg/dL (ref 0.2–1.0)
pH, UA: 6 (ref 5.0–7.5)

## 2021-10-01 NOTE — Progress Notes (Signed)
?Subjective: ? ?1. Personal history of malignant neoplasm of prostate   ?2. Male stress incontinence   ?3. Hypogonadism in male   ?4. History of nephrolithiasis   ?  ? ?George Wang is a 76 year-old male established patient who is here for his history of prostate cancer ? ?His most recent PSA is <0.1 on 08/26/21   His testosterone was 31 last year.  ? ?George Wang returns in f/u for his history of prostate cancer treated in 09/2003 with radical prostatectomy. His PSA's have been undetectible. The PSA prior to this visit was <0.1. He is voiding well with minimal incontinence which has improved. he no longer wears a guard. He has some nocturia x 1-3. He has persistant ED. He is not on treatment. He has hypogonadism with a testosterone of 31 on 08/27/20 but didn't elect to have treatment. He has a history of stones but has had no hematuria or flank pain.   He had a CT AP in 2/21 that showed no stones.  He has no associated signs or symptoms.  IPSS is 3. ? ? IPSS   ? ? [place] Name 10/01/21 1400  ?  ?  ?  ? International Prostate Symptom Score  ? How often have you had the sensation of not emptying your bladder? Not at All    ? How often have you had to urinate George than every two hours? George than 1 in 5 times    ? How often have you found you stopped and started again several times when you urinated? Not at All    ? How often have you found it difficult to postpone urination? Not at All    ? How often have you had a weak urinary stream? Not at All    ? How often have you had to strain to start urination? Not at All    ? How many times did you typically get up at night to urinate? 2 Times    ? Total IPSS Score 3    ?  ? Quality of Life due to urinary symptoms  ? If you were to spend the rest of your life with your urinary condition just the way it is now how would you feel about that? Pleased    ? ?  ?  ? ?  ? ? IPSS   ? ? [place] Name 10/01/21 1400  ?  ?  ?  ? International Prostate Symptom Score  ? How often have you had the sensation  of not emptying your bladder? Not at All    ? How often have you had to urinate George than every two hours? George than 1 in 5 times    ? How often have you found you stopped and started again several times when you urinated? Not at All    ? How often have you found it difficult to postpone urination? Not at All    ? How often have you had a weak urinary stream? Not at All    ? How often have you had to strain to start urination? Not at All    ? How many times did you typically get up at night to urinate? 2 Times    ? Total IPSS Score 3    ?  ? Quality of Life due to urinary symptoms  ? If you were to spend the rest of your life with your urinary condition just the way it is now how would you feel about that?  Pleased    ? ?  ?  ? ?  ? ? ? ? ?ROS: ? ?ROS:  ?A complete review of systems was performed.  All systems are negative except for pertinent findings as noted.  ? ?Review of Systems  ?Musculoskeletal:  Positive for back pain and joint pain.  ?Psychiatric/Behavioral:  Positive for depression.   ?All other systems reviewed and are negative. ? ?No Known Allergies ? ?Outpatient Encounter Medications as of 10/01/2021  ?Medication Sig  ? acetaminophen (TYLENOL) 325 MG tablet Take 650 mg by mouth every 6 (six) hours as needed for mild pain.  ? amLODipine (NORVASC) 10 MG tablet Take 1 tablet (10 mg total) by mouth daily.  ? atorvastatin (LIPITOR) 80 MG tablet TAKE 1/2 TABLET DAILY AT 6PM  ? beta carotene 25000 UNIT capsule Take 25,000 Units by mouth daily.  ? Blood Glucose Calibration (OT ULTRA/FASTTK CNTRL SOLN) SOLN USE WITH METER AS DIRECTED DX E11.9  ? blood glucose meter kit and supplies Dispense based on patient and insurance preference. Use up to four times daily as directed. (FOR ICD-10 E10.9, E11.9).  ? busPIRone (BUSPAR) 15 MG tablet 2 qam   2  qhs  ? chlorthalidone (HYGROTON) 25 MG tablet TAKE 1/2 TABLET BY MOUTH ONCE DAILY  ? Cholecalciferol (VITAMIN D) 2000 UNITS CAPS Take 1 capsule by mouth daily.  ?  FeFum-FePo-FA-B Cmp-C-Zn-Mn-Cu (SE-TAN PLUS) 162-115.2-1 MG CAPS Take 1 capsule by mouth 2 (two) times daily.  ? fish oil-omega-3 fatty acids 1000 MG capsule Take 1 g by mouth 2 (two) times daily.   ? glucose blood (ONETOUCH ULTRA) test strip Test BS up to 4 times daily Dx E11.9  ? LORazepam (ATIVAN) 0.5 MG tablet 1  qam  1  Midday  2  qhs  ? losartan (COZAAR) 100 MG tablet TAKE ONE (1) TABLET EACH DAY  ? metFORMIN (GLUCOPHAGE) 500 MG tablet TAKE 2 TABLETS EACH MORNING AND 1 TABLETEACH EVENING  ? mirtazapine (REMERON SOL-TAB) 15 MG disintegrating tablet Take one 15 mg tablet each morning.  ? mirtazapine (REMERON) 30 MG tablet 1  qhs  ? Multiple Vitamins-Minerals (CENTRUM SILVER PO) Take 1 tablet by mouth daily.   ? omeprazole (PRILOSEC) 20 MG capsule Take 1 capsule (20 mg total) by mouth daily.  ? temazepam (RESTORIL) 15 MG capsule 2 qhs (Patient taking differently: 1 qhs- can take 2 per pt)  ? venlafaxine XR (EFFEXOR XR) 75 MG 24 hr capsule 3  qam  ? [DISCONTINUED] febuxostat (ULORIC) 40 MG tablet Take 80 mg by mouth daily.  ? [DISCONTINUED] simvastatin (ZOCOR) 80 MG tablet Take 80 mg by mouth at bedtime.  ? ?No facility-administered encounter medications on file as of 10/01/2021.  ? ? ?Past Medical History:  ?Diagnosis Date  ? Allergy   ? mild  ? Anemia   ? Iron deficiency  ? Anxiety state, unspecified   ? Aortic atherosclerosis ([place])   ? Arthritis   ? Calculus of kidney   ? kidney stones  - normal BMP   ? Cancer [facility])   ? Prostate, skin cancer x 2   ? Cataract   ? forming both eyes  ? Depressive disorder, not elsewhere classified   ? Diverticulosis of colon (without mention of hemorrhage)   ? GERD (gastroesophageal reflux disease)   ? on nexium  ? Gout, unspecified   ? Heart murmur   ? Hyperlipidemia   ? Hypertension, essential   ? Irritable bowel syndrome   ? Neuromuscular disorder ([place])   ?  numbness in knees from back issues   ? Personal history of malignant neoplasm of prostate   ? PVC (premature ventricular  contraction)   ? Type II or unspecified type diabetes mellitus without mention of complication, not stated as uncontrolled   ? ? ?Past Surgical History:  ?Procedure Laterality Date  ? COLONOSCOPY    ? HERNIA REPAIR    ? left side  ? POLYPECTOMY    ? PROSTATE SURGERY    ? skin cancer removed    ? chest x 2 3-4 yrs ago   ? TONSILLECTOMY    ? ? ?Social History  ? ?Socioeconomic History  ? Marital status: Married  ?  Spouse name: [name]  ? Number of children: 2  ? Years of education: 41  ? Highest education level: Some college, no degree  ?Occupational History  ? 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 Smoking status: Former  ?  Packs/day: 1.00  ?  Years: 19.00  ?  Pack years: 19.00  ?  Types: Cigarettes  ?  Quit date: 10/13/1978  ?  Years since quitting: 42.9  ? Smokeless tobacco: Former  ?  Types: Snuff  ?  Quit date: 05/30/2008  ?Vaping Use  ? Vaping Use: Never used  ?Substance and Sexual Activity  ? Alcohol use: Not Currently  ? Drug use: No  ? Sexual activity: Yes  ?Other Topics Concern  ? Not on file  ?Social History Narrative  ? Lives at home with wife.  He has one son and a daughter that died in a tornado in 98. 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 He has one living grandson and one grandson that passed away in Aug 16, 2016 at age 56 from cancer.  ? ?Social Determinants of Health  ? ?Financial Resource Strain: Low Risk   ? Difficulty of Paying Living Expenses: Not hard at all  ?Food Insecurity: No Food Insecurity  ? Worried About Charity fundraiser in the Last Year: Never true  ? Ran Out of Food in the Last Year: Never true  ?Transportation Needs: No Transportation Needs  ? Lack of Transportation (Medical): No  ? Lack of Transportation (Non-Medical): No  ?Physical Activity: Inactive  ? Days of Exercise per Week: 0 days  ? Minutes of Exercise per Session: 0 min  ?Stress: No Stress Concern Present  ? Feeling of Stress : Only a little  ?Social Connections: Socially  Integrated  ? Frequency of Communication with Friends and Family: More than three times a week  ? Frequency of Social Gatherings with Friends and Family: Once a week  ? Attends Religious Services: More than 4 time

## 2021-10-03 ENCOUNTER — Other Ambulatory Visit: Payer: Self-pay | Admitting: Family Medicine

## 2021-10-03 DIAGNOSIS — E1169 Type 2 diabetes mellitus with other specified complication: Secondary | ICD-10-CM

## 2021-10-03 DIAGNOSIS — E782 Mixed hyperlipidemia: Secondary | ICD-10-CM

## 2021-10-12 ENCOUNTER — Other Ambulatory Visit: Payer: Self-pay | Admitting: Family Medicine

## 2021-10-12 DIAGNOSIS — I10 Essential (primary) hypertension: Secondary | ICD-10-CM

## 2021-11-04 ENCOUNTER — Ambulatory Visit (INDEPENDENT_AMBULATORY_CARE_PROVIDER_SITE_OTHER): Payer: Medicare Other | Admitting: Family Medicine

## 2021-11-04 ENCOUNTER — Encounter: Payer: Self-pay | Admitting: Family Medicine

## 2021-11-04 ENCOUNTER — Ambulatory Visit (HOSPITAL_BASED_OUTPATIENT_CLINIC_OR_DEPARTMENT_OTHER): Payer: Medicare Other | Admitting: Psychiatry

## 2021-11-04 VITALS — BP 120/70 | HR 95 | Ht 63.0 in | Wt 171.0 lb

## 2021-11-04 DIAGNOSIS — Z23 Encounter for immunization: Secondary | ICD-10-CM | POA: Diagnosis not present

## 2021-11-04 DIAGNOSIS — I10 Essential (primary) hypertension: Secondary | ICD-10-CM

## 2021-11-04 DIAGNOSIS — R140 Abdominal distension (gaseous): Secondary | ICD-10-CM | POA: Diagnosis not present

## 2021-11-04 DIAGNOSIS — F324 Major depressive disorder, single episode, in partial remission: Secondary | ICD-10-CM

## 2021-11-04 DIAGNOSIS — E1165 Type 2 diabetes mellitus with hyperglycemia: Secondary | ICD-10-CM

## 2021-11-04 DIAGNOSIS — E782 Mixed hyperlipidemia: Secondary | ICD-10-CM

## 2021-11-04 DIAGNOSIS — E1169 Type 2 diabetes mellitus with other specified complication: Secondary | ICD-10-CM

## 2021-11-04 LAB — BAYER DCA HB A1C WAIVED: HB A1C (BAYER DCA - WAIVED): 7.1 % — ABNORMAL HIGH (ref 4.8–5.6)

## 2021-11-04 MED ORDER — ATORVASTATIN CALCIUM 80 MG PO TABS: ORAL_TABLET | ORAL | 3 refills | Status: DC

## 2021-11-04 MED ORDER — CHLORTHALIDONE 25 MG PO TABS: 12.5000 mg | ORAL_TABLET | Freq: Every day | ORAL | 3 refills | Status: DC

## 2021-11-04 MED ORDER — LORAZEPAM 0.5 MG PO TABS: ORAL_TABLET | ORAL | 2 refills | Status: DC

## 2021-11-04 MED ORDER — VENLAFAXINE HCL ER 75 MG PO CP24: ORAL_CAPSULE | ORAL | 5 refills | Status: DC

## 2021-11-04 MED ORDER — AMLODIPINE BESYLATE 10 MG PO TABS: ORAL_TABLET | ORAL | 3 refills | Status: DC

## 2021-11-04 MED ORDER — METFORMIN HCL 500 MG PO TABS: 1000.0000 mg | ORAL_TABLET | Freq: Two times a day (BID) | ORAL | 3 refills | Status: DC

## 2021-11-04 MED ORDER — BUSPIRONE HCL 15 MG PO TABS: ORAL_TABLET | ORAL | 5 refills | Status: DC

## 2021-11-04 MED ORDER — LOSARTAN POTASSIUM 100 MG PO TABS: ORAL_TABLET | ORAL | 3 refills | Status: DC

## 2021-11-04 MED ORDER — MIRTAZAPINE 15 MG PO TBDP: ORAL_TABLET | ORAL | 2 refills | Status: DC

## 2021-11-04 MED ORDER — METFORMIN HCL 500 MG PO TABS: ORAL_TABLET | ORAL | 3 refills | Status: DC

## 2021-11-04 MED ORDER — MIRTAZAPINE 30 MG PO TABS: ORAL_TABLET | ORAL | 3 refills | Status: DC

## 2021-11-04 MED ORDER — OMEPRAZOLE 20 MG PO CPDR: 20.0000 mg | DELAYED_RELEASE_CAPSULE | Freq: Every day | ORAL | 3 refills | Status: DC

## 2021-11-04 NOTE — Progress Notes (Signed)
? ?BP 120/70   Pulse 95   Ht _0  (1.6 m)   Wt 171 lb (77.6 kg)   SpO2 97%   BMI 30.29 kg/m?   ? ?Subjective:  ? ?Patient ID: George Wang, male    DOB: 27-Jun-1946, 76 y.o.   MRN: [id number] ? ?HPI: ?George Wang is a 76 y.o. male presenting on 11/04/2021 for Medical Management of Chronic Issues and Diabetes ? ? ?HPI ?Type 2 diabetes mellitus ?Patient comes in today for recheck of his diabetes. Patient has been currently taking metformin 2 in the morning and 1 in the evening, A1c slightly up at 7.1. Patient is currently on an ACE inhibitor/ARB. Patient has not seen an ophthalmologist this year. Patient denies any issues with their feet. The symptom started onset as an adult hyperlipidemia and hypertension and aortic atherosclerosis ARE RELATED TO DM  ? ?Hyperlipidemia ?Patient is coming in for recheck of his hyperlipidemia. The patient is currently taking fish oil and atorvastatin. They deny any issues with myalgias or history of liver damage from it. They deny any focal numbness or weakness or chest pain.  ? ?Hypertension ?Patient is currently on amlodipine and losartan and chlorthalidone, and their blood pressure today is 120/70. Patient denies any lightheadedness or dizziness. Patient denies headaches, blurred vision, chest pains, shortness of breath, or weakness. Denies any side effects from medication and is content with current medication.  ? ?Relevant past medical, surgical, family and social history reviewed and updated as indicated. Interim medical history since our last visit reviewed. ?Allergies and medications reviewed and updated. ? ?Review of Systems  ?Constitutional:  Negative for chills and fever.  ?Eyes:  Negative for visual disturbance.  ?Respiratory:  Negative for shortness of breath and wheezing.   ?Cardiovascular:  Negative for chest pain and leg swelling.  ?Musculoskeletal:  Negative for back pain and gait problem.  ?Skin:  Negative for rash.  ?Neurological:  Negative for dizziness,  weakness and light-headedness.  ?All other systems reviewed and are negative. ? ?Per HPI unless specifically indicated above ? ? ?Allergies as of 11/04/2021   ?No Known Allergies ?  ? ?  ?Medication List  ?  ? ?  ? Accurate as of November 04, 2021 10:49 AM. If you have any questions, ask your nurse or doctor.  ?  ?  ? ?  ? ?acetaminophen 325 MG tablet ?Commonly known as: TYLENOL ?Take 650 mg by mouth every 6 (six) hours as needed for mild pain. ?  ?amLODipine 10 MG tablet ?Commonly known as: NORVASC ?TAKE ONE (1) TABLET EACH DAY ?  ?atorvastatin 80 MG tablet ?Commonly known as: LIPITOR ?TAKE 1/2 TABLET DAILY AT 6PM ?  ?beta carotene 25000 UNIT capsule ?Take 25,000 Units by mouth daily. ?  ?blood glucose meter kit and supplies ?Dispense based on patient and insurance preference. Use up to four times daily as directed. (FOR ICD-10 E10.9, E11.9). ?  ?busPIRone 15 MG tablet ?Commonly known as: BUSPAR ?2 qam   2  qhs ?  ?CENTRUM SILVER PO ?Take 1 tablet by mouth daily. ?  ?chlorthalidone 25 MG tablet ?Commonly known as: HYGROTON ?Take 0.5 tablets (12.5 mg total) by mouth daily. ?  ?fish oil-omega-3 fatty acids 1000 MG capsule ?Take 1 g by mouth 2 (two) times daily. ?  ?LORazepam 0.5 MG tablet ?Commonly known as: Ativan ?1  qam  1  Midday  2  qhs ?  ?losartan 100 MG tablet ?Commonly known as: COZAAR ?TAKE ONE (1) TABLET EACH DAY ?  ?  metFORMIN 500 MG tablet ?Commonly known as: GLUCOPHAGE ?Take 2 tablets (1,000 mg total) by mouth 2 (two) times daily with a meal. TAKE 2 TABLETS EACH MORNING AND 1 TABLETEACH EVENING ?What changed: See the new instructions. ?Changed by: Worthy Rancher, MD ?  ?mirtazapine 15 MG disintegrating tablet ?Commonly known as: REMERON SOL-TAB ?Take one 15 mg tablet each morning. ?  ?mirtazapine 30 MG tablet ?Commonly known as: Remeron ?1  qhs ?  ?omeprazole 20 MG capsule ?Commonly known as: PRILOSEC ?Take 1 capsule (20 mg total) by mouth daily. ?  ?OneTouch Ultra test strip ?Generic drug: glucose  blood ?Test BS up to 4 times daily Dx E11.9 ?  ?OT ULTRA/FASTTK CNTRL SOLN Soln ?USE WITH METER AS DIRECTED DX E11.9 ?  ?Se-Tan PLUS 162-115.2-1 MG Caps ?Take 1 capsule by mouth 2 (two) times daily. ?  ?temazepam 15 MG capsule ?Commonly known as: RESTORIL ?2 qhs ?What changed: additional instructions ?  ?venlafaxine XR 75 MG 24 hr capsule ?Commonly known as: Effexor XR ?3  qam ?  ?Vitamin D 50 MCG (2000 UT) Caps ?Take 1 capsule by mouth daily. ?  ? ?  ? ? ? ?Objective:  ? ?BP 120/70   Pulse 95   Ht _0  (1.6 m)   Wt 171 lb (77.6 kg)   SpO2 97%   BMI 30.29 kg/m?   ?Wt Readings from Last 3 Encounters:  ?11/04/21 171 lb (77.6 kg)  ?09/17/21 170 lb (77.1 kg)  ?09/07/21 170 lb (77.1 kg)  ?  ?Physical Exam ?Vitals and nursing note reviewed.  ?Constitutional:   ?   General: He is not in acute distress. ?   Appearance: He is well-developed. He is not diaphoretic.  ?Eyes:  ?   General: No scleral icterus. ?   Conjunctiva/sclera: Conjunctivae normal.  ?Neck:  ?   Thyroid: No thyromegaly.  ?Cardiovascular:  ?   Rate and Rhythm: Normal rate and regular rhythm.  ?   Heart sounds: Normal heart sounds. No murmur heard. ?Pulmonary:  ?   Effort: Pulmonary effort is normal. No respiratory distress.  ?   Breath sounds: Normal breath sounds. No wheezing.  ?Musculoskeletal:     ?   General: No swelling. Normal range of motion.  ?   Cervical back: Neck supple.  ?Lymphadenopathy:  ?   Cervical: No cervical adenopathy.  ?Skin: ?   General: Skin is warm and dry.  ?   Findings: No rash.  ?Neurological:  ?   Mental Status: He is alert and oriented to person, place, and time.  ?   Coordination: Coordination normal.  ?Psychiatric:     ?   Behavior: Behavior normal.  ? ? ? ? ?Assessment & Plan:  ? ?Problem List Items Addressed This Visit   ? ?  ? Cardiovascular and Mediastinum  ? Essential hypertension  ? Relevant Medications  ? amLODipine (NORVASC) 10 MG tablet  ? atorvastatin (LIPITOR) 80 MG tablet  ? chlorthalidone (HYGROTON) 25 MG  tablet  ? losartan (COZAAR) 100 MG tablet  ? omeprazole (PRILOSEC) 20 MG capsule  ? metFORMIN (GLUCOPHAGE) 500 MG tablet  ? Other Relevant Orders  ? CBC with Differential/Platelet  ? CMP14+EGFR  ? Lipid panel  ?  ? Endocrine  ? Type 2 diabetes mellitus ([place]) - Primary  ? Relevant Medications  ? atorvastatin (LIPITOR) 80 MG tablet  ? losartan (COZAAR) 100 MG tablet  ? metFORMIN (GLUCOPHAGE) 500 MG tablet  ? Other Relevant Orders  ? Bayer DCA Hb  A1c Waived  ?  ? Other  ? Hyperlipemia  ? Relevant Medications  ? amLODipine (NORVASC) 10 MG tablet  ? atorvastatin (LIPITOR) 80 MG tablet  ? chlorthalidone (HYGROTON) 25 MG tablet  ? losartan (COZAAR) 100 MG tablet  ? omeprazole (PRILOSEC) 20 MG capsule  ? metFORMIN (GLUCOPHAGE) 500 MG tablet  ? Other Relevant Orders  ? CBC with Differential/Platelet  ? CMP14+EGFR  ? Lipid panel  ? ?Other Visit Diagnoses   ? ? Bloating      ? Relevant Medications  ? omeprazole (PRILOSEC) 20 MG capsule  ? Need for shingles vaccine      ? Relevant Orders  ? Varicella-zoster vaccine IM (Shingrix) (Completed)  ? ?  ?  ?A1c is slightly up at 7.1, we can do diet or increase medicine and he wants to try increasing the metformin. ?Follow up plan: ?Return in about 3 months (around 02/03/2022), or if symptoms worsen or fail to improve, for Diabetes and hypertension and cholesterol. ? ?Counseling provided for all of the vaccine components ?Orders Placed This Encounter  ?Procedures  ? Varicella-zoster vaccine IM (Shingrix)  ? Bayer DCA Hb A1c Waived  ? CBC with Differential/Platelet  ? CMP14+EGFR  ? Lipid panel  ? ? ?Caryl Pina, MD ?[place] ?11/04/2021, 10:49 AM ? ? ? ? ?

## 2021-11-04 NOTE — Progress Notes (Signed)
Psychiatric Initial Adult Assessment  ? ?Patient Identification: George Wang ?MRN:  [id number] ?Date of Evaluation:  11/04/2021 ?Referral Source: Community/emergency room ?Chief Complaint:   ?Visit Diagnosis: Major depression ? ?History of Present Illness:   ? ?Today the patient is seen with his wife.  The patient is actually doing very well.  His depression is essentially lifted.  Importantly I think directly related to this is the fact that his son needs him to work on a regular basis.  In essence with the patient has nothing to do he admits he just sits and thinks gets anxious and I likely believe he becomes depressed.  I think the concept of staying busy and working with something that is meaningful makes him less depressed and keeps away his anxiety.  He is sleeping and eating very well.  They have some minor issues with her daughter-in-law but no other significant stresses.  He does not drink any alcohol.  He looks neat and clean.  His wife is talkative.  The patient seems at peace.  His anxiety level is much reduced.  In fact he himself has cut down his Ativan from taking .5 mg 4 times a day now just to taking 1 in the morning and 1 at night.  The patient has completely stopped his Restoril.  He is sleeping without it.  He continues taking Remeron 15 mg in the morning and 30 at night.  Her intervention on her last visit was to increase his BuSpar to taking 15 mg 2 in the morning and 2 at night and I think that is been helpful. ? ? ?Depression Symptoms:  depressed mood, ?(Hypo) Manic Symptoms:   ?Anxiety Symptoms:  Excessive Worry, ?Psychotic Symptoms:   ?PTSD Symptoms: ? ? ?Past Psychiatric History: 1 psychiatric hospitalization in 1989 presently on Lexapro 10 mg, BuSpar 30 mg ? ?Previous Psychotropic Medications: Yes  ? ?Substance Abuse History in the last 12 months:  No. ? ?Consequences of Substance Abuse: ? ? ?Past Medical History:  ?Past Medical History:  ?Diagnosis Date  ? Allergy   ? mild  ? Anemia    ? Iron deficiency  ? Anxiety state, unspecified   ? Aortic atherosclerosis ([place])   ? Arthritis   ? Calculus of kidney   ? kidney stones  - normal BMP   ? Cancer [facility])   ? Prostate, skin cancer x 2   ? Cataract   ? forming both eyes  ? Depressive disorder, not elsewhere classified   ? Diverticulosis of colon (without mention of hemorrhage)   ? GERD (gastroesophageal reflux disease)   ? on nexium  ? Gout, unspecified   ? Heart murmur   ? Hyperlipidemia   ? Hypertension, essential   ? Irritable bowel syndrome   ? Neuromuscular disorder ([facility])   ? numbness in knees from back issues   ? Personal history of malignant neoplasm of prostate   ? PVC (premature ventricular contraction)   ? Type II or unspecified type diabetes mellitus without mention of complication, not stated as uncontrolled   ?  ?Past Surgical History:  ?Procedure Laterality Date  ? COLONOSCOPY    ? HERNIA REPAIR    ? left side  ? POLYPECTOMY    ? PROSTATE SURGERY    ? skin cancer removed    ? chest x 2 3-4 yrs ago   ? TONSILLECTOMY    ? ? ?Family Psychiatric History:  ? ?Family History:  ?Family History  ?Problem Relation Age of Onset  ?  Cancer Maternal Aunt   ?     breast  ? Pancreatic cancer Paternal Aunt   ? Prostate cancer Paternal Uncle   ? Heart disease Maternal Grandmother 16  ? Heart disease Maternal Grandfather   ?     fluid / CHF   ? Heart disease Paternal Grandfather 85  ? Heart attack Paternal Grandfather   ?     passed at 58  ? Prostate cancer Father   ? Coronary artery disease Father 8  ?     CABG  ? Hypertension Mother   ? Hyperlipidemia Mother   ? Other Daughter   ?     accident with tornado 09-06-96  ? Hypertension Son   ? Colon cancer Neg Hx   ? Colon polyps Neg Hx   ? Esophageal cancer Neg Hx   ? Rectal cancer Neg Hx   ? Stomach cancer Neg Hx   ? ? ?Social History:   ?Social History  ? ?Socioeconomic History  ? Marital status: Married  ?  Spouse name: [name]  ? Number of children: 2  ? Years of education: 74  ? Highest education level: Some  college, no degree  ?Occupational History  ? 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 Smoking status: Former  ?  Packs/day: 1.00  ?  Years: 19.00  ?  Pack years: 19.00  ?  Types: Cigarettes  ?  Quit date: 10/13/1978  ?  Years since quitting: 43.0  ? Smokeless tobacco: Former  ?  Types: Snuff  ?  Quit date: 05/30/2008  ?Vaping Use  ? Vaping Use: Never used  ?Substance and Sexual Activity  ? Alcohol use: Not Currently  ? Drug use: No  ? Sexual activity: Yes  ?Other Topics Concern  ? Not on file  ?Social History Narrative  ? Lives at home with wife.  He has one son and a daughter that died in a tornado in 21. 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 He has one living grandson and one grandson that passed away in 2016-09-06 at age 28 from cancer.  ? ?Social Determinants of Health  ? ?Financial Resource Strain: Low Risk   ? Difficulty of Paying Living Expenses: Not hard at all  ?Food Insecurity: No Food Insecurity  ? Worried About Charity fundraiser in the Last Year: Never true  ? Ran Out of Food in the Last Year: Never true  ?Transportation Needs: No Transportation Needs  ? Lack of Transportation (Medical): No  ? Lack of Transportation (Non-Medical): No  ?Physical Activity: Inactive  ? Days of Exercise per Week: 0 days  ? Minutes of Exercise per Session: 0 min  ?Stress: No Stress Concern Present  ? Feeling of Stress : Only a little  ?Social Connections: Socially Integrated  ? Frequency of Communication with Friends and Family: More than three times a week  ? Frequency of Social Gatherings with Friends and Family: Once a week  ? Attends Religious Services: More than 4 times per year  ? Active Member of Clubs or Organizations: Yes  ? Attends Archivist Meetings: 1 to 4 times per year  ? Marital Status: Married  ? ? ?Additional Social History:  ? ?Allergies:  No Known Allergies ? ?Metabolic Disorder Labs: ?Lab Results  ?Component Value Date  ? HGBA1C 6.5 (H)  08/03/2021  ? ?No results found for: PROLACTIN ?Lab Results  ?Component Value Date  ? CHOL 117  08/03/2021  ? TRIG 98 08/03/2021  ? HDL 42 08/03/2021  ? CHOLHDL 2.8 08/03/2021  ? [place] 56 08/03/2021  ? LDLCALC 45 03/31/2021  ? ?Lab Results  ?Component Value Date  ? TSH 1.330 12/05/2018  ? ? ?Therapeutic Level Labs: ?No results found for: LITHIUM ?No results found for: CBMZ ?No results found for: VALPROATE ? ?Current Medications: ?Current Outpatient Medications  ?Medication Sig Dispense Refill  ? acetaminophen (TYLENOL) 325 MG tablet Take 650 mg by mouth every 6 (six) hours as needed for mild pain.    ? amLODipine (NORVASC) 10 MG tablet TAKE ONE (1) TABLET EACH DAY 90 tablet 3  ? atorvastatin (LIPITOR) 80 MG tablet TAKE 1/2 TABLET DAILY AT 6PM 45 tablet 3  ? beta carotene 25000 UNIT capsule Take 25,000 Units by mouth daily.    ? Blood Glucose Calibration (OT ULTRA/FASTTK CNTRL SOLN) SOLN USE WITH METER AS DIRECTED DX E11.9 1 each 1  ? blood glucose meter kit and supplies Dispense based on patient and insurance preference. Use up to four times daily as directed. (FOR ICD-10 E10.9, E11.9). 1 each 1  ? busPIRone (BUSPAR) 15 MG tablet 2 qam   2  qhs 120 tablet 5  ? chlorthalidone (HYGROTON) 25 MG tablet Take 0.5 tablets (12.5 mg total) by mouth daily. 45 tablet 3  ? Cholecalciferol (VITAMIN D) 2000 UNITS CAPS Take 1 capsule by mouth daily.    ? FeFum-FePo-FA-B Cmp-C-Zn-Mn-Cu (SE-TAN PLUS) 162-115.2-1 MG CAPS Take 1 capsule by mouth 2 (two) times daily. 180 capsule 3  ? fish oil-omega-3 fatty acids 1000 MG capsule Take 1 g by mouth 2 (two) times daily.     ? glucose blood (ONETOUCH ULTRA) test strip Test BS up to 4 times daily Dx E11.9 400 strip 3  ? LORazepam (ATIVAN) 0.5 MG tablet 1  qam  1  Midday  2  qhs 120 tablet 2  ? losartan (COZAAR) 100 MG tablet TAKE ONE (1) TABLET EACH DAY 90 tablet 3  ? metFORMIN (GLUCOPHAGE) 500 MG tablet Take 2 tablets (1,000 mg total) by mouth 2 (two) times daily with a meal. TAKE 2  TABLETS EACH MORNING AND 1 TABLETEACH EVENING 360 tablet 3  ? mirtazapine (REMERON SOL-TAB) 15 MG disintegrating tablet Take one 15 mg tablet each morning. 30 tablet 2  ? mirtazapine (REMERON) 30 MG tablet 1  qhs 3

## 2021-11-05 LAB — LIPID PANEL
Chol/HDL Ratio: 2.7 ratio (ref 0.0–5.0)
Cholesterol, Total: 112 mg/dL (ref 100–199)
HDL: 41 mg/dL (ref >39)
LDL Chol Calc (NIH): 55 mg/dL (ref 0–99)
Triglycerides: 76 mg/dL (ref 0–149)
VLDL Cholesterol Cal: 16 mg/dL (ref 5–40)

## 2021-11-05 LAB — CMP14+EGFR
ALT: 17 IU/L (ref 0–44)
AST: 58 IU/L — ABNORMAL HIGH (ref 0–40)
Albumin/Globulin Ratio: 2.6 — ABNORMAL HIGH (ref 1.2–2.2)
Albumin: 4.6 g/dL (ref 3.7–4.7)
Alkaline Phosphatase: 78 IU/L (ref 44–121)
BUN/Creatinine Ratio: 15 (ref 10–24)
BUN: 20 mg/dL (ref 8–27)
Bilirubin Total: 0.6 mg/dL (ref 0.0–1.2)
CO2: 26 mmol/L (ref 20–29)
Calcium: 9.7 mg/dL (ref 8.6–10.2)
Chloride: 103 mmol/L (ref 96–106)
Creatinine, Ser: 1.3 mg/dL — ABNORMAL HIGH (ref 0.76–1.27)
Globulin, Total: 1.8 g/dL (ref 1.5–4.5)
Glucose: 170 mg/dL — ABNORMAL HIGH (ref 70–99)
Potassium: 4.8 mmol/L (ref 3.5–5.2)
Sodium: 144 mmol/L (ref 134–144)
Total Protein: 6.4 g/dL (ref 6.0–8.5)
eGFR: 57 mL/min/{1.73_m2} — ABNORMAL LOW (ref >59)

## 2021-11-05 LAB — CBC WITH DIFFERENTIAL/PLATELET
Basophils Absolute: 0.1 10*3/uL (ref 0.0–0.2)
Basos: 1 %
EOS (ABSOLUTE): 0.2 10*3/uL (ref 0.0–0.4)
Eos: 3 %
Hematocrit: 35.8 % — ABNORMAL LOW (ref 37.5–51.0)
Hemoglobin: 12.2 g/dL — ABNORMAL LOW (ref 13.0–17.7)
Immature Grans (Abs): 0 10*3/uL (ref 0.0–0.1)
Immature Granulocytes: 0 %
Lymphocytes Absolute: 1.6 10*3/uL (ref 0.7–3.1)
Lymphs: 26 %
MCH: 33.3 pg — ABNORMAL HIGH (ref 26.6–33.0)
MCHC: 34.1 g/dL (ref 31.5–35.7)
MCV: 98 fL — ABNORMAL HIGH (ref 79–97)
Monocytes Absolute: 0.6 10*3/uL (ref 0.1–0.9)
Monocytes: 10 %
Neutrophils Absolute: 3.6 10*3/uL (ref 1.4–7.0)
Neutrophils: 60 %
Platelets: 242 10*3/uL (ref 150–450)
RBC: 3.66 x10E6/uL — ABNORMAL LOW (ref 4.14–5.80)
RDW: 12.3 % (ref 11.6–15.4)
WBC: 6.1 10*3/uL (ref 3.4–10.8)

## 2022-01-12 ENCOUNTER — Ambulatory Visit (HOSPITAL_COMMUNITY): Payer: Medicare Other | Admitting: Psychiatry

## 2022-01-15 ENCOUNTER — Ambulatory Visit (HOSPITAL_COMMUNITY): Payer: Medicare Other | Admitting: Psychiatry

## 2022-01-15 ENCOUNTER — Ambulatory Visit (HOSPITAL_BASED_OUTPATIENT_CLINIC_OR_DEPARTMENT_OTHER): Payer: Medicare Other | Admitting: Psychiatry

## 2022-01-15 ENCOUNTER — Encounter (HOSPITAL_COMMUNITY): Payer: Self-pay | Admitting: Psychiatry

## 2022-01-15 VITALS — BP 122/70 | HR 68 | Resp 12 | Wt 172.6 lb

## 2022-01-15 DIAGNOSIS — F325 Major depressive disorder, single episode, in full remission: Secondary | ICD-10-CM

## 2022-01-15 MED ORDER — MIRTAZAPINE 15 MG PO TBDP: ORAL_TABLET | ORAL | 5 refills | Status: DC

## 2022-01-15 MED ORDER — LORAZEPAM 0.5 MG PO TABS: ORAL_TABLET | ORAL | 5 refills | Status: DC

## 2022-01-15 MED ORDER — VENLAFAXINE HCL ER 75 MG PO CP24: ORAL_CAPSULE | ORAL | 5 refills | Status: DC

## 2022-01-15 MED ORDER — BUSPIRONE HCL 15 MG PO TABS: ORAL_TABLET | ORAL | 5 refills | Status: DC

## 2022-01-15 MED ORDER — MIRTAZAPINE 30 MG PO TABS
ORAL_TABLET | ORAL | 5 refills | Status: DC
Start: 2022-01-15 — End: 2022-05-19

## 2022-01-15 NOTE — Progress Notes (Signed)
Psychiatric Initial Adult Assessment   Patient Identification: George Wang MRN:  [id number] Date of Evaluation:  01/15/2022 Referral Source: Community/emergency room Chief Complaint:   Visit Diagnosis: Major depression  History of Present Illness:     Today the patient is seen in a follow-up appointment.  The patient is doing very well.  He denies any depression.  He recently went to the beach and had a great time and felt a little bit dysphoric when he had to come back.  He has moments when he gets depressed but short-lived.  Never longer than a day.  The patient is done great.  He is sleeping and eating well.  He is got good energy.  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  This is pretty good and he also is active.  He completely sees the idea that when he stays busy doing something he likes his mood is good.  When he has nothing to do he can become obsessive anxious and ultimately depressed.  We talked about the importance of finding something to do with the day comes that he will retire.  Today we are going to go ahead and reduce his Ativan to just taking 0.5 mg at night for a month and then he will discontinue it.  There is a problem he noticed still having to go back to if he needs it.  So essentially going to change his as needed.  He no longer takes Restoril.  He takes a good dose of BuSpar and Effexor.  He will continue on these medicines.   Depression Symptoms:  depressed mood, (Hypo) Manic Symptoms:   Anxiety Symptoms:  Excessive Worry, Psychotic Symptoms:   PTSD Symptoms:   Past Psychiatric History: 1 psychiatric hospitalization in 1989 presently on Lexapro 10 mg, BuSpar 30 mg  Previous Psychotropic Medications: Yes   Substance Abuse History in the last 12 months:  No.  Consequences of Substance Abuse:   Past Medical History:  Past Medical History:  Diagnosis Date   Allergy    mild   Anemia    Iron  deficiency   Anxiety state, unspecified    Aortic atherosclerosis (HCC)    Arthritis    Calculus of kidney    kidney stones  - normal BMP    Cancer (HCC)    Prostate, skin cancer x 2    Cataract    forming both eyes   Depressive disorder, not elsewhere classified    Diverticulosis of colon (without mention of hemorrhage)    GERD (gastroesophageal reflux disease)    on nexium   Gout, unspecified    Heart murmur    Hyperlipidemia    Hypertension, essential    Irritable bowel syndrome    Neuromuscular disorder (HCC)    numbness in knees from back issues    Personal history of malignant neoplasm of prostate    PVC (premature ventricular contraction)    Type II or unspecified type diabetes mellitus without mention of complication, not stated as uncontrolled     Past Surgical History:  Procedure Laterality Date   COLONOSCOPY     HERNIA REPAIR     left side   POLYPECTOMY     PROSTATE SURGERY     skin cancer removed     chest x 2 3-4 yrs ago    TONSILLECTOMY      Family Psychiatric History:   Family History:  Family History  Problem   Relation Age of Onset   Cancer Maternal Aunt        breast   Pancreatic cancer Paternal Aunt    Prostate cancer Paternal Uncle    Heart disease Maternal Grandmother 69   Heart disease Maternal Grandfather        fluid / CHF    Heart disease Paternal Grandfather 80   Heart attack Paternal Grandfather        passed at 80   Prostate cancer Father    Coronary artery disease Father 79       CABG   Hypertension Mother    Hyperlipidemia Mother    Other Daughter        accident with tornado 1998   Hypertension Son    Colon cancer Neg Hx    Colon polyps Neg Hx    Esophageal cancer Neg Hx    Rectal cancer Neg Hx    Stomach cancer Neg Hx     Social History:   Social History   Socioeconomic History   Marital status: Married    Spouse name: Nancy   Number of children: 2   Years of education: 13   Highest education level: Some college,  no degree  Occupational History   Occupation: Construction/Heavy equipment    Comment: Part time  Tobacco Use   Smoking status: Former    Packs/day: 1.00    Years: 19.00    Total pack years: 19.00    Types: Cigarettes    Quit date: 10/13/1978    Years since quitting: 43.2   Smokeless tobacco: Former    Types: Snuff    Quit date: 05/30/2008  Vaping Use   Vaping Use: Never used  Substance and Sexual Activity   Alcohol use: Not Currently   Drug use: No   Sexual activity: Yes  Other Topics Concern   Not on file  Social History Narrative   Lives at home with wife.  He has one son and a daughter that died in a tornado in 1998. 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 He has one living grandson and one grandson that passed away in 2018 at age 17 from cancer.   Social Determinants of Health   Financial Resource Strain: Low Risk  (07/15/2021)   Overall Financial Resource Strain (CARDIA)    Difficulty of Paying Living Expenses: Not hard at all  Food Insecurity: No Food Insecurity (07/15/2021)   Hunger Vital Sign    Worried About Running Out of Food in the Last Year: Never true    Ran Out of Food in the Last Year: Never true  Transportation Needs: No Transportation Needs (07/15/2021)   PRAPARE - Transportation    Lack of Transportation (Medical): No    Lack of Transportation (Non-Medical): No  Physical Activity: Inactive (07/15/2021)   Exercise Vital Sign    Days of Exercise per Week: 0 days    Minutes of Exercise per Session: 0 min  Stress: No Stress Concern Present (07/15/2021)   [employer] - Occupational Stress Questionnaire    Feeling of Stress : Only a little  Social Connections: Socially Integrated (07/15/2021)   Social Connection and Isolation Panel [NHANES]    Frequency of Communication with Friends and Family: More than three times a week    Frequency of Social Gatherings with Friends and Family: Once a week    Attends Religious  Services: More than 4 times per year    Active Member of Clubs or   Organizations: Yes    Attends Club or Organization Meetings: 1 to 4 times per year    Marital Status: Married    Additional Social History:   Allergies:  No Known Allergies  Metabolic Disorder Labs: Lab Results  Component Value Date   HGBA1C 7.1 (H) 11/04/2021   No results found for: "PROLACTIN" Lab Results  Component Value Date   CHOL 112 11/04/2021   TRIG 76 11/04/2021   HDL 41 11/04/2021   CHOLHDL 2.7 11/04/2021   LDLCALC 55 11/04/2021   LDLCALC 56 08/03/2021   Lab Results  Component Value Date   TSH 1.330 12/05/2018    Therapeutic Level Labs: No results found for: "LITHIUM" No results found for: "CBMZ" No results found for: "VALPROATE"  Current Medications: Current Outpatient Medications  Medication Sig Dispense Refill   acetaminophen (TYLENOL) 325 MG tablet Take 650 mg by mouth every 6 (six) hours as needed for mild pain.     amLODipine (NORVASC) 10 MG tablet TAKE ONE (1) TABLET EACH DAY 90 tablet 3   atorvastatin (LIPITOR) 80 MG tablet TAKE 1/2 TABLET DAILY AT 6PM 45 tablet 3   beta carotene 25000 UNIT capsule Take 25,000 Units by mouth daily.     Blood Glucose Calibration (OT ULTRA/FASTTK CNTRL SOLN) SOLN USE WITH METER AS DIRECTED DX E11.9 1 each 1   blood glucose meter kit and supplies Dispense based on patient and insurance preference. Use up to four times daily as directed. (FOR ICD-10 E10.9, E11.9). 1 each 1   busPIRone (BUSPAR) 15 MG tablet 2 qam   2  qhs 120 tablet 5   chlorthalidone (HYGROTON) 25 MG tablet Take 0.5 tablets (12.5 mg total) by mouth daily. 45 tablet 3   Cholecalciferol (VITAMIN D) 2000 UNITS CAPS Take 1 capsule by mouth daily.     FeFum-FePo-FA-B Cmp-C-Zn-Mn-Cu (SE-TAN PLUS) 162-115.2-1 MG CAPS Take 1 capsule by mouth 2 (two) times daily. 180 capsule 3   fish oil-omega-3 fatty acids 1000 MG capsule Take 1 g by mouth 2 (two) times daily.      glucose blood (ONETOUCH ULTRA)  test strip Test BS up to 4 times daily Dx E11.9 400 strip 3   LORazepam (ATIVAN) 0.5 MG tablet 1 BID 60 tablet 5   losartan (COZAAR) 100 MG tablet TAKE ONE (1) TABLET EACH DAY 90 tablet 3   metFORMIN (GLUCOPHAGE) 500 MG tablet Take 2 tablets (1,000 mg total) by mouth 2 (two) times daily with a meal. TAKE 2 TABLETS EACH MORNING AND 1 TABLETEACH EVENING 360 tablet 3   mirtazapine (REMERON SOL-TAB) 15 MG disintegrating tablet Take one 15 mg tablet each morning. 30 tablet 5   mirtazapine (REMERON) 30 MG tablet 1  qhs 30 tablet 5   Multiple Vitamins-Minerals (CENTRUM SILVER PO) Take 1 tablet by mouth daily.      omeprazole (PRILOSEC) 20 MG capsule Take 1 capsule (20 mg total) by mouth daily. 90 capsule 3   temazepam (RESTORIL) 15 MG capsule 2 qhs (Patient taking differently: 1 qhs- can take 2 per pt) 60 capsule 2   venlafaxine XR (EFFEXOR XR) 75 MG 24 hr capsule 3  qam 90 capsule 5   No current facility-administered medications for this visit.    Musculoskeletal: Strength & Muscle Tone: within normal limits Gait & Station: normal Patient leans: N/A  Psychiatric Specialty Exam: Review of Systems  There were no vitals taken for this visit.There is no height or weight on file to calculate BMI.  General Appearance: Casual    Eye Contact:  Good  Speech:  Clear and Coherent  Volume:  Normal  Mood:  Depressed  Affect:  Appropriate  Thought Process:  Coherent  Orientation:  Full (Time, Place, and Person)  Thought Content:  WDL  Suicidal Thoughts:  No  Homicidal Thoughts:  No  Memory:  Negative  Judgement:  Good  Insight:  Good  Psychomotor Activity:  Normal  Concentration:    Recall:  Good  Fund of Knowledge:Fair  Language: Good  Akathisia:  No  Handed:  Right  AIMS (if indicated):  not done  Assets:  Desire for Improvement  ADL's:  Intact  Cognition: WNL  Sleep:  Good   Screenings: GAD-7    Flowsheet Row Office Visit from 11/04/2021 in [facility]  Visit from 08/03/2021 in [facility] Office Visit from 03/31/2021 in [facility] Visit from 02/11/2021 in [facility] Office Visit from 11/27/2020 in [facility]  Total GAD-7 Score 7 10 11 11 6      Mini-Mental    Flowsheet Row Clinical Support from 05/16/2017 in [facility]  Total Score (max 30 points ) 29      PHQ2-9    Flowsheet Row Office Visit from 11/04/2021 in [facility] Office Visit from 08/03/2021 in [facility] Clinical Support from 07/15/2021 in [facility] Office Visit from 03/31/2021 in [facility] Office Visit from 02/11/2021 in [facility]  PHQ-2 Total Score 2 3 6 4 4  PHQ-9 Total Score 6 7 15 11 11       Assessment and Plan:  6/30/202310:27 AM     This patient's first problem is that of major clinical depression which is now in remission.  We will continue taking Effexor 75 mg 3 in the morning.  We will continue taking Remeron 15 mg in the morning and 30 mg at night.  I would like him to stay on these medicines probably indefinitely.  The patient also was taking Ativan 0.5 mg twice daily but will going to attempt to reduce it.  We will reduce it to just taking 1 at night and then after a month he can discontinue that.  Still have Ativan available to him.  So his second problem is an adjustment disorder with anxious mood state he gets a great response with BuSpar 15 mg 2 twice daily.  Return to see me in 4 months. 

## 2022-02-15 ENCOUNTER — Encounter: Payer: Self-pay | Admitting: Family Medicine

## 2022-02-15 ENCOUNTER — Ambulatory Visit (INDEPENDENT_AMBULATORY_CARE_PROVIDER_SITE_OTHER): Payer: Medicare Other | Admitting: Family Medicine

## 2022-02-15 VITALS — BP 131/68 | HR 85 | Temp 98.0°F | Ht 63.0 in | Wt 169.0 lb

## 2022-02-15 DIAGNOSIS — E782 Mixed hyperlipidemia: Secondary | ICD-10-CM | POA: Diagnosis not present

## 2022-02-15 DIAGNOSIS — I10 Essential (primary) hypertension: Secondary | ICD-10-CM

## 2022-02-15 DIAGNOSIS — E1165 Type 2 diabetes mellitus with hyperglycemia: Secondary | ICD-10-CM

## 2022-02-15 LAB — CBC WITH DIFFERENTIAL/PLATELET
Basophils Absolute: 0.1 10*3/uL (ref 0.0–0.2)
Basos: 2 %
EOS (ABSOLUTE): 0.1 10*3/uL (ref 0.0–0.4)
Eos: 3 %
Hematocrit: 34.1 % — ABNORMAL LOW (ref 37.5–51.0)
Hemoglobin: 12 g/dL — ABNORMAL LOW (ref 13.0–17.7)
Immature Grans (Abs): 0 10*3/uL (ref 0.0–0.1)
Immature Granulocytes: 0 %
Lymphocytes Absolute: 1.6 10*3/uL (ref 0.7–3.1)
Lymphs: 35 %
MCH: 33.9 pg — ABNORMAL HIGH (ref 26.6–33.0)
MCHC: 35.2 g/dL (ref 31.5–35.7)
MCV: 96 fL (ref 79–97)
Monocytes Absolute: 0.5 10*3/uL (ref 0.1–0.9)
Monocytes: 11 %
Neutrophils Absolute: 2.2 10*3/uL (ref 1.4–7.0)
Neutrophils: 49 %
Platelets: 240 10*3/uL (ref 150–450)
RBC: 3.54 x10E6/uL — ABNORMAL LOW (ref 4.14–5.80)
RDW: 11.5 % — ABNORMAL LOW (ref 11.6–15.4)
WBC: 4.6 10*3/uL (ref 3.4–10.8)

## 2022-02-15 LAB — CMP14+EGFR
ALT: 18 IU/L (ref 0–44)
AST: 54 IU/L — ABNORMAL HIGH (ref 0–40)
Albumin/Globulin Ratio: 2.3 — ABNORMAL HIGH (ref 1.2–2.2)
Albumin: 4.5 g/dL (ref 3.8–4.8)
Alkaline Phosphatase: 73 IU/L (ref 44–121)
BUN/Creatinine Ratio: 21 (ref 10–24)
BUN: 24 mg/dL (ref 8–27)
Bilirubin Total: 0.6 mg/dL (ref 0.0–1.2)
CO2: 26 mmol/L (ref 20–29)
Calcium: 10 mg/dL (ref 8.6–10.2)
Chloride: 100 mmol/L (ref 96–106)
Creatinine, Ser: 1.17 mg/dL (ref 0.76–1.27)
Globulin, Total: 2 g/dL (ref 1.5–4.5)
Glucose: 97 mg/dL (ref 70–99)
Potassium: 5.1 mmol/L (ref 3.5–5.2)
Sodium: 142 mmol/L (ref 134–144)
Total Protein: 6.5 g/dL (ref 6.0–8.5)
eGFR: 64 mL/min/{1.73_m2} (ref >59)

## 2022-02-15 LAB — LIPID PANEL
Chol/HDL Ratio: 3 ratio (ref 0.0–5.0)
Cholesterol, Total: 117 mg/dL (ref 100–199)
HDL: 39 mg/dL — ABNORMAL LOW (ref >39)
LDL Chol Calc (NIH): 62 mg/dL (ref 0–99)
Triglycerides: 81 mg/dL (ref 0–149)
VLDL Cholesterol Cal: 16 mg/dL (ref 5–40)

## 2022-02-15 LAB — BAYER DCA HB A1C WAIVED: HB A1C (BAYER DCA - WAIVED): 6.4 % — ABNORMAL HIGH (ref 4.8–5.6)

## 2022-02-15 MED ORDER — SE-TAN PLUS 162-115.2-1 MG PO CAPS
1.0000 | ORAL_CAPSULE | Freq: Two times a day (BID) | ORAL | 3 refills | Status: DC
Start: 2022-02-15 — End: 2023-01-10

## 2022-02-15 NOTE — Progress Notes (Signed)
 BP 131/68   Pulse 85   Temp 98 F (36.7 C)   Ht 5' 3" (1.6 m)   Wt 169 lb (76.7 kg)   SpO2 98%   BMI 29.94 kg/m    Subjective:   Patient ID: George Wang, male    DOB: 12/01/1945, 76 y.o.   MRN: [id number]  HPI: George Wang is a 76 y.o. male presenting on 02/15/2022 for Medical Management of Chronic Issues and Diabetes   HPI Type 2 diabetes mellitus Patient comes in today for recheck of his diabetes. Patient has been currently taking metformin. Patient is currently on an ACE inhibitor/ARB. Patient has not seen an ophthalmologist this year. Patient denies any issues with their feet. The symptom started onset as an adult hypertension and hyperlipidemia ARE RELATED TO DM   Hypertension Patient is currently on amlodipine and chlorthalidone and losartan, and their blood pressure today is 131/68. Patient denies any lightheadedness or dizziness. Patient denies headaches, blurred vision, chest pains, shortness of breath, or weakness. Denies any side effects from medication and is content with current medication.   Hyperlipidemia and thoracic aortic atherosclerosis Patient is coming in for recheck of his hyperlipidemia. The patient is currently taking fish oil and atorvastatin. They deny any issues with myalgias or history of liver damage from it. They deny any focal numbness or weakness or chest pain.   Relevant past medical, surgical, family and social history reviewed and updated as indicated. Interim medical history since our last visit reviewed. Allergies and medications reviewed and updated.  Review of Systems  Constitutional:  Negative for chills and fever.  Eyes:  Negative for visual disturbance.  Respiratory:  Negative for shortness of breath and wheezing.   Cardiovascular:  Negative for chest pain and leg swelling.  Musculoskeletal:  Negative for back pain and gait problem.  Skin:  Negative for rash.  Neurological:  Negative for dizziness, weakness and light-headedness.   All other systems reviewed and are negative.   Per HPI unless specifically indicated above   Allergies as of 02/15/2022   No Known Allergies      Medication List        Accurate as of February 15, 2022 10:32 AM. If you have any questions, ask your nurse or doctor.          STOP taking these medications    LORazepam 0.5 MG tablet Commonly known as: Ativan Stopped by: Joshua A Dettinger, MD       TAKE these medications    acetaminophen 325 MG tablet Commonly known as: TYLENOL Take 650 mg by mouth every 6 (six) hours as needed for mild pain.   amLODipine 10 MG tablet Commonly known as: NORVASC TAKE ONE (1) TABLET EACH DAY   atorvastatin 80 MG tablet Commonly known as: LIPITOR TAKE 1/2 TABLET DAILY AT 6PM   beta carotene 25000 UNIT capsule Take 25,000 Units by mouth daily.   blood glucose meter kit and supplies Dispense based on patient and insurance preference. Use up to four times daily as directed. (FOR ICD-10 E10.9, E11.9).   busPIRone 15 MG tablet Commonly known as: BUSPAR 2 qam   2  qhs   CENTRUM SILVER PO Take 1 tablet by mouth daily.   chlorthalidone 25 MG tablet Commonly known as: HYGROTON Take 0.5 tablets (12.5 mg total) by mouth daily.   fish oil-omega-3 fatty acids 1000 MG capsule Take 1 g by mouth 2 (two) times daily.   losartan 100 MG tablet Commonly known as:   COZAAR TAKE ONE (1) TABLET EACH DAY   metFORMIN 500 MG tablet Commonly known as: GLUCOPHAGE Take 2 tablets (1,000 mg total) by mouth 2 (two) times daily with a meal. TAKE 2 TABLETS EACH MORNING AND 1 TABLETEACH EVENING   mirtazapine 15 MG disintegrating tablet Commonly known as: REMERON SOL-TAB Take one 15 mg tablet each morning.   mirtazapine 30 MG tablet Commonly known as: Remeron 1  qhs   omeprazole 20 MG capsule Commonly known as: PRILOSEC Take 1 capsule (20 mg total) by mouth daily.   OneTouch Ultra test strip Generic drug: glucose blood Test BS up to 4 times  daily Dx E11.9   OT ULTRA/FASTTK CNTRL SOLN Soln USE WITH METER AS DIRECTED DX E11.9   Se-Tan PLUS 162-115.2-1 MG Caps Take 1 capsule by mouth 2 (two) times daily.   temazepam 15 MG capsule Commonly known as: RESTORIL 2 qhs What changed: additional instructions   venlafaxine XR 75 MG 24 hr capsule Commonly known as: Effexor XR 3  qam   Vitamin D 50 MCG (2000 UT) Caps Take 1 capsule by mouth daily.         Objective:   BP 131/68   Pulse 85   Temp 98 F (36.7 C)   Ht 5' 3" (1.6 m)   Wt 169 lb (76.7 kg)   SpO2 98%   BMI 29.94 kg/m   Wt Readings from Last 3 Encounters:  02/15/22 169 lb (76.7 kg)  11/04/21 171 lb (77.6 kg)  09/17/21 170 lb (77.1 kg)    Physical Exam Vitals and nursing note reviewed.  Constitutional:      General: He is not in acute distress.    Appearance: He is well-developed. He is not diaphoretic.  Eyes:     General: No scleral icterus.    Conjunctiva/sclera: Conjunctivae normal.  Neck:     Thyroid: No thyromegaly.  Cardiovascular:     Rate and Rhythm: Normal rate and regular rhythm.     Heart sounds: Normal heart sounds. No murmur heard. Pulmonary:     Effort: Pulmonary effort is normal. No respiratory distress.     Breath sounds: Normal breath sounds. No wheezing.  Musculoskeletal:        General: No swelling. Normal range of motion.     Cervical back: Neck supple.  Lymphadenopathy:     Cervical: No cervical adenopathy.  Skin:    General: Skin is warm and dry.     Findings: No rash.  Neurological:     Mental Status: He is alert and oriented to person, place, and time.     Coordination: Coordination normal.  Psychiatric:        Behavior: Behavior normal.       Assessment & Plan:   Problem List Items Addressed This Visit       Cardiovascular and Mediastinum   Essential hypertension   Relevant Orders   CBC with Differential/Platelet   CMP14+EGFR   Lipid panel   Bayer DCA Hb A1c Waived     Endocrine   Type 2  diabetes mellitus (HCC) - Primary   Relevant Orders   CBC with Differential/Platelet   CMP14+EGFR   Lipid panel   Bayer DCA Hb A1c Waived     Other   Hyperlipemia   Relevant Orders   CBC with Differential/Platelet   CMP14+EGFR   Lipid panel   Bayer DCA Hb A1c Waived    Blood pressure looks good and A1c looks good, A1c is 6.4 today.    He is getting a little indigestion from it Follow up plan: Return in about 3 months (around 05/18/2022), or if symptoms worsen or fail to improve, for Diabetes and hypertension and cholesterol.  Counseling provided for all of the vaccine components Orders Placed This Encounter  Procedures   CBC with Differential/Platelet   CMP14+EGFR   Lipid panel   Bayer DCA Hb A1c [place], MD [facility] 02/15/2022, 10:32 AM

## 2022-02-18 DIAGNOSIS — E119 Type 2 diabetes mellitus without complications: Secondary | ICD-10-CM | POA: Diagnosis not present

## 2022-02-18 DIAGNOSIS — H40033 Anatomical narrow angle, bilateral: Secondary | ICD-10-CM | POA: Diagnosis not present

## 2022-02-18 LAB — HM DIABETES EYE EXAM

## 2022-04-13 ENCOUNTER — Encounter: Payer: Self-pay | Admitting: *Deleted

## 2022-05-14 ENCOUNTER — Encounter: Payer: Self-pay | Admitting: Family Medicine

## 2022-05-14 ENCOUNTER — Ambulatory Visit (INDEPENDENT_AMBULATORY_CARE_PROVIDER_SITE_OTHER): Payer: Medicare Other | Admitting: Family Medicine

## 2022-05-14 VITALS — BP 128/76 | HR 84 | Temp 98.7°F | Ht 63.0 in | Wt 172.0 lb

## 2022-05-14 DIAGNOSIS — E782 Mixed hyperlipidemia: Secondary | ICD-10-CM

## 2022-05-14 DIAGNOSIS — E1169 Type 2 diabetes mellitus with other specified complication: Secondary | ICD-10-CM

## 2022-05-14 DIAGNOSIS — Z23 Encounter for immunization: Secondary | ICD-10-CM | POA: Diagnosis not present

## 2022-05-14 DIAGNOSIS — I10 Essential (primary) hypertension: Secondary | ICD-10-CM

## 2022-05-14 LAB — BAYER DCA HB A1C WAIVED: HB A1C (BAYER DCA - WAIVED): 6.7 % — ABNORMAL HIGH (ref 4.8–5.6)

## 2022-05-14 NOTE — Progress Notes (Signed)
BP 128/76   Pulse 84   Temp 98.7 F (37.1 C)   Ht _0  (1.6 m)   Wt 172 lb (78 kg)   SpO2 96%   BMI 30.47 kg/m    Subjective:   Patient ID: George Wang, male    DOB: [birth year], 76 y.o.   MRN: [id number]  HPI: George Wang is a 76 y.o. male presenting on 05/14/2022 for Medical Management of Chronic Issues (3 month - pt has no concerns )   HPI Type 2 diabetes mellitus Patient comes in today for recheck of his diabetes. Patient has been currently taking metformin. Patient is currently on an ACE inhibitor/ARB. Patient has seen an ophthalmologist this year. Patient denies any issues with their feet. The symptom started onset as an adult hypertension and hyperlipidemia ARE RELATED TO DM   Hypertension Patient is currently on amlodipine and chlorthalidone and losartan, and their blood pressure today is 128/76. Patient denies any lightheadedness or dizziness. Patient denies headaches, blurred vision, chest pains, shortness of breath, or weakness. Denies any side effects from medication and is content with current medication.   Hyperlipidemia Patient is coming in for recheck of his hyperlipidemia. The patient is currently taking atorvastatin. They deny any issues with myalgias or history of liver damage from it. They deny any focal numbness or weakness or chest pain.   Relevant past medical, surgical, family and social history reviewed and updated as indicated. Interim medical history since our last visit reviewed. Allergies and medications reviewed and updated.  Review of Systems  Constitutional:  Negative for chills and fever.  Eyes:  Negative for visual disturbance.  Respiratory:  Negative for shortness of breath and wheezing.   Cardiovascular:  Negative for chest pain and leg swelling.  Musculoskeletal:  Negative for back pain and gait problem.  Skin:  Negative for rash.  Neurological:  Negative for dizziness, weakness and light-headedness.  All other systems reviewed and  are negative.   Per HPI unless specifically indicated above   Allergies as of 05/14/2022   No Known Allergies      Medication List        Accurate as of May 14, 2022 10:39 AM. If you have any questions, ask your nurse or doctor.          acetaminophen 325 MG tablet Commonly known as: TYLENOL Take 650 mg by mouth every 6 (six) hours as needed for mild pain.   amLODipine 10 MG tablet Commonly known as: NORVASC TAKE ONE (1) TABLET EACH DAY   atorvastatin 80 MG tablet Commonly known as: LIPITOR TAKE 1/2 TABLET DAILY AT 6PM   beta carotene 25000 UNIT capsule Take 25,000 Units by mouth daily.   blood glucose meter kit and supplies Dispense based on patient and insurance preference. Use up to four times daily as directed. (FOR ICD-10 E10.9, E11.9).   busPIRone 15 MG tablet Commonly known as: BUSPAR 2 qam   2  qhs   CENTRUM SILVER PO Take 1 tablet by mouth daily.   chlorthalidone 25 MG tablet Commonly known as: HYGROTON Take 0.5 tablets (12.5 mg total) by mouth daily.   fish oil-omega-3 fatty acids 1000 MG capsule Take 1 g by mouth 2 (two) times daily.   losartan 100 MG tablet Commonly known as: COZAAR TAKE ONE (1) TABLET EACH DAY   metFORMIN 500 MG tablet Commonly known as: GLUCOPHAGE Take 2 tablets (1,000 mg total) by mouth 2 (two) times daily with a meal. TAKE 2 TABLETS  EACH MORNING AND 1 TABLETEACH EVENING   mirtazapine 15 MG disintegrating tablet Commonly known as: REMERON SOL-TAB Take one 15 mg tablet each morning.   mirtazapine 30 MG tablet Commonly known as: Remeron 1  qhs   omeprazole 20 MG capsule Commonly known as: PRILOSEC Take 1 capsule (20 mg total) by mouth daily.   OneTouch Ultra test strip Generic drug: glucose blood Test BS up to 4 times daily Dx E11.9   OT ULTRA/FASTTK CNTRL SOLN Soln USE WITH METER AS DIRECTED DX E11.9   Se-Tan PLUS 162-115.2-1 MG Caps Take 1 capsule by mouth 2 (two) times daily.   temazepam 15 MG  capsule Commonly known as: RESTORIL 2 qhs What changed: additional instructions   venlafaxine XR 75 MG 24 hr capsule Commonly known as: Effexor XR 3  qam   Vitamin D 50 MCG (2000 UT) Caps Take 1 capsule by mouth daily.         Objective:   BP 128/76   Pulse 84   Temp 98.7 F (37.1 C)   Ht _0  (1.6 m)   Wt 172 lb (78 kg)   SpO2 96%   BMI 30.47 kg/m   Wt Readings from Last 3 Encounters:  05/14/22 172 lb (78 kg)  02/15/22 169 lb (76.7 kg)  11/04/21 171 lb (77.6 kg)    Physical Exam Vitals and nursing note reviewed.  Constitutional:      General: He is not in acute distress.    Appearance: He is well-developed. He is not diaphoretic.  Eyes:     General: No scleral icterus.    Conjunctiva/sclera: Conjunctivae normal.  Neck:     Thyroid: No thyromegaly.  Cardiovascular:     Rate and Rhythm: Normal rate and regular rhythm.     Heart sounds: Normal heart sounds. No murmur heard. Pulmonary:     Effort: Pulmonary effort is normal. No respiratory distress.     Breath sounds: Normal breath sounds. No wheezing.  Musculoskeletal:        General: No swelling. Normal range of motion.     Cervical back: Neck supple.  Lymphadenopathy:     Cervical: No cervical adenopathy.  Skin:    General: Skin is warm and dry.     Findings: No rash.  Neurological:     Mental Status: He is alert and oriented to person, place, and time.     Coordination: Coordination normal.  Psychiatric:        Behavior: Behavior normal.     Results for orders placed or performed in visit on 02/25/22  HM DIABETES EYE EXAM  Result Value Ref Range   HM Diabetic Eye Exam No Retinopathy No Retinopathy    Assessment & Plan:   Problem List Items Addressed This Visit       Cardiovascular and Mediastinum   Essential hypertension   Relevant Orders   CBC with Differential/Platelet   CMP14+EGFR   Lipid panel   Vitamin B12     Endocrine   Type 2 diabetes mellitus ([place]) - Primary   Relevant  Orders   Bayer DCA Hb A1c Waived   Microalbumin / creatinine urine ratio     Other   Hyperlipemia   Other Visit Diagnoses     Need for immunization against influenza       Relevant Orders   Flu Vaccine QUAD High Dose(Fluad) (Completed)       A1c is 6.7, looks good.  No changes. Follow up plan: Return in about 3  months (around 08/14/2022), or if symptoms worsen or fail to improve, for Diabetes and hypertension.  Counseling provided for all of the vaccine components Orders Placed This Encounter  Procedures   Flu Vaccine QUAD High Dose(Fluad)   CBC with Differential/Platelet   CMP14+EGFR   Lipid panel   Bayer DCA Hb A1c Waived   Vitamin B12   Microalbumin / creatinine urine ratio    Caryl Pina, MD [facility] 05/14/2022, 10:39 AM

## 2022-05-15 LAB — LIPID PANEL
Chol/HDL Ratio: 3 ratio (ref 0.0–5.0)
Cholesterol, Total: 128 mg/dL (ref 100–199)
HDL: 43 mg/dL (ref >39)
LDL Chol Calc (NIH): 68 mg/dL (ref 0–99)
Triglycerides: 86 mg/dL (ref 0–149)
VLDL Cholesterol Cal: 17 mg/dL (ref 5–40)

## 2022-05-15 LAB — CBC WITH DIFFERENTIAL/PLATELET
Basophils Absolute: 0.1 10*3/uL (ref 0.0–0.2)
Basos: 2 %
EOS (ABSOLUTE): 0.6 10*3/uL — ABNORMAL HIGH (ref 0.0–0.4)
Eos: 11 %
Hematocrit: 36.8 % — ABNORMAL LOW (ref 37.5–51.0)
Hemoglobin: 12 g/dL — ABNORMAL LOW (ref 13.0–17.7)
Immature Grans (Abs): 0 10*3/uL (ref 0.0–0.1)
Immature Granulocytes: 0 %
Lymphocytes Absolute: 1.9 10*3/uL (ref 0.7–3.1)
Lymphs: 31 %
MCH: 31.8 pg (ref 26.6–33.0)
MCHC: 32.6 g/dL (ref 31.5–35.7)
MCV: 98 fL — ABNORMAL HIGH (ref 79–97)
Monocytes Absolute: 0.5 10*3/uL (ref 0.1–0.9)
Monocytes: 9 %
Neutrophils Absolute: 2.8 10*3/uL (ref 1.4–7.0)
Neutrophils: 47 %
Platelets: 270 10*3/uL (ref 150–450)
RBC: 3.77 x10E6/uL — ABNORMAL LOW (ref 4.14–5.80)
RDW: 11.9 % (ref 11.6–15.4)
WBC: 6 10*3/uL (ref 3.4–10.8)

## 2022-05-15 LAB — CMP14+EGFR
ALT: 17 IU/L (ref 0–44)
AST: 64 IU/L — ABNORMAL HIGH (ref 0–40)
Albumin/Globulin Ratio: 2.2 (ref 1.2–2.2)
Albumin: 4.6 g/dL (ref 3.8–4.8)
Alkaline Phosphatase: 75 IU/L (ref 44–121)
BUN/Creatinine Ratio: 21 (ref 10–24)
BUN: 25 mg/dL (ref 8–27)
Bilirubin Total: 0.5 mg/dL (ref 0.0–1.2)
CO2: 25 mmol/L (ref 20–29)
Calcium: 10 mg/dL (ref 8.6–10.2)
Chloride: 100 mmol/L (ref 96–106)
Creatinine, Ser: 1.18 mg/dL (ref 0.76–1.27)
Globulin, Total: 2.1 g/dL (ref 1.5–4.5)
Glucose: 101 mg/dL — ABNORMAL HIGH (ref 70–99)
Potassium: 4.5 mmol/L (ref 3.5–5.2)
Sodium: 141 mmol/L (ref 134–144)
Total Protein: 6.7 g/dL (ref 6.0–8.5)
eGFR: 63 mL/min/{1.73_m2} (ref >59)

## 2022-05-15 LAB — VITAMIN B12: Vitamin B-12: 405 pg/mL (ref 232–1245)

## 2022-05-16 LAB — MICROALBUMIN / CREATININE URINE RATIO
Creatinine, Urine: 306.8 mg/dL
Microalb/Creat Ratio: 17 mg/g creat (ref 0–29)
Microalbumin, Urine: 52.8 ug/mL

## 2022-05-19 ENCOUNTER — Ambulatory Visit (HOSPITAL_BASED_OUTPATIENT_CLINIC_OR_DEPARTMENT_OTHER): Payer: Medicare Other | Admitting: Psychiatry

## 2022-05-19 DIAGNOSIS — F325 Major depressive disorder, single episode, in full remission: Secondary | ICD-10-CM | POA: Diagnosis not present

## 2022-05-19 MED ORDER — VENLAFAXINE HCL ER 75 MG PO CP24: ORAL_CAPSULE | ORAL | 5 refills | Status: DC

## 2022-05-19 MED ORDER — LORAZEPAM 0.5 MG PO TABS: ORAL_TABLET | ORAL | 4 refills | Status: DC

## 2022-05-19 MED ORDER — BUSPIRONE HCL 15 MG PO TABS: ORAL_TABLET | ORAL | 5 refills | Status: DC

## 2022-05-19 MED ORDER — MIRTAZAPINE 30 MG PO TABS: ORAL_TABLET | ORAL | 5 refills | Status: DC

## 2022-05-19 MED ORDER — MIRTAZAPINE 15 MG PO TBDP: ORAL_TABLET | ORAL | 5 refills | Status: DC

## 2022-05-19 MED ORDER — VENLAFAXINE HCL ER 75 MG PO CP24
ORAL_CAPSULE | ORAL | 5 refills | Status: DC
Start: 2022-05-19 — End: 2022-05-19

## 2022-05-19 NOTE — Progress Notes (Signed)
Psychiatric Initial Adult Assessment   Patient Identification: George Wang MRN:  [id number] Date of Evaluation:  05/19/2022 Referral Source: Community/emergency room Chief Complaint:   Visit Diagnosis: Major depression  History of Present Illness:      Today the patient is seen with his wife.  The patient is really doing quite well.  A month or 2 ago he started getting anxious and was getting frightened that he was going backwards.  We restarted his Ativan 0.5 mg 1 twice a day and then he went up to taking 2 in the morning and 1 at night with 1 extra if he takes his now and then.  Overall his mood is great.  He takes BuSpar which really helps.  He is eating well has good energy work some of the time.  He drinks no alcohol and uses no drugs.  He is actually quite stable.  The days in the past that he was very distressed that it was a very difficult time for him.  I shared with him that it was okay that he restarted his Ativan as that was part of our plan.  He is not oversedated at all.  He drinks no alcohol as noted and has had no falls.  He now history of shifting to working doing more yard work around his home less dependent upon his son who asked him to come to work at various times.  His health is reasonably good.  He owns his own home and likes where he lives.  He has had no new medical problems.  Depression Symptoms:  depressed mood, (Hypo) Manic Symptoms:   Anxiety Symptoms:  Excessive Worry, Psychotic Symptoms:   PTSD Symptoms:   Past Psychiatric History: 1 psychiatric hospitalization in 1989 presently on Lexapro 10 mg, BuSpar 30 mg  Previous Psychotropic Medications: Yes   Substance Abuse History in the last 12 months:  No.  Consequences of Substance Abuse:   Past Medical History:  Past Medical History:  Diagnosis Date   Allergy    mild   Anemia    Iron deficiency   Anxiety state, unspecified    Aortic atherosclerosis ([place])    Arthritis    Calculus of kidney     kidney stones  - normal BMP    Cancer (HCC)    Prostate, skin cancer x 2    Cataract    forming both eyes   Depressive disorder, not elsewhere classified    Diverticulosis of colon (without mention of hemorrhage)    GERD (gastroesophageal reflux disease)    on nexium   Gout, unspecified    Heart murmur    Hyperlipidemia    Hypertension, essential    Irritable bowel syndrome    Neuromuscular disorder (HCC)    numbness in knees from back issues    Personal history of malignant neoplasm of prostate    PVC (premature ventricular contraction)    Type II or unspecified type diabetes mellitus without mention of complication, not stated as uncontrolled     Past Surgical History:  Procedure Laterality Date   COLONOSCOPY     HERNIA REPAIR     left side   POLYPECTOMY     PROSTATE SURGERY     skin cancer removed     chest x 2 3-4 yrs ago    TONSILLECTOMY      Family Psychiatric History:   Family History:  Family History  Problem Relation Age of Onset   Cancer Maternal Aunt  breast   Pancreatic cancer Paternal Aunt    Prostate cancer Paternal Uncle    Heart disease Maternal Grandmother 79   Heart disease Maternal Grandfather        fluid / CHF    Heart disease Paternal Grandfather 10   Heart attack Paternal Grandfather        passed at 43   Prostate cancer Father    Coronary artery disease Father 37       CABG   Hypertension Mother    Hyperlipidemia Mother    Other Daughter        accident with tornado 09-06-1996   Hypertension Son    Colon cancer Neg Hx    Colon polyps Neg Hx    Esophageal cancer Neg Hx    Rectal cancer Neg Hx    Stomach cancer Neg Hx     Social History:   Social History   Socioeconomic History   Marital status: Married    Spouse name: [name]   Number of children: 2   Years of education: 13   Highest education level: Some college, no degree  Occupational History   Occupation: Production designer, theatre/television/film    Comment: Part time  Tobacco Use    Smoking status: Former    Packs/day: 1.00    Years: 19.00    Total pack years: 19.00    Types: Cigarettes    Quit date: 10/13/1978    Years since quitting: 43.6   Smokeless tobacco: Former    Types: Snuff    Quit date: 05/30/2008  Vaping Use   Vaping Use: Never used  Substance and Sexual Activity   Alcohol use: Not Currently   Drug use: No   Sexual activity: Yes  Other Topics Concern   Not on file  Social History Narrative   Lives at home with wife.  He has one son and a daughter that died in a tornado in 51. 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 He has one living grandson and one grandson that passed away in 09-06-16 at age 59 from cancer.   Social Determinants of Health   Financial Resource Strain: Low Risk  (07/15/2021)   Overall Financial Resource Strain (CARDIA)    Difficulty of Paying Living Expenses: Not hard at all  Food Insecurity: No Food Insecurity (07/15/2021)   Hunger Vital Sign    Worried About Running Out of Food in the Last Year: Never true    Ran Out of Food in the Last Year: Never true  Transportation Needs: No Transportation Needs (07/15/2021)   PRAPARE - Hydrologist (Medical): No    Lack of Transportation (Non-Medical): No  Physical Activity: Inactive (07/15/2021)   Exercise Vital Sign    Days of Exercise per Week: 0 days    Minutes of Exercise per Session: 0 min  Stress: No Stress Concern Present (07/15/2021)   [place]    Feeling of Stress : Only a little  Social Connections: Socially Integrated (07/15/2021)   Social Connection and Isolation Panel [NHANES]    Frequency of Communication with Friends and Family: More than three times a week    Frequency of Social Gatherings with Friends and Family: Once a week    Attends Religious Services: More than 4 times per year    Active Member of [employer] or Organizations: Yes    Attends Theatre manager Meetings: 1 to 4 times per year  Marital Status: Married    Additional Social History:   Allergies:  No Known Allergies  Metabolic Disorder Labs: Lab Results  Component Value Date   HGBA1C 6.7 (H) 05/14/2022   No results found for: "PROLACTIN" Lab Results  Component Value Date   CHOL 128 05/14/2022   TRIG 86 05/14/2022   HDL 43 05/14/2022   CHOLHDL 3.0 05/14/2022   LDLCALC 68 05/14/2022   LDLCALC 62 02/15/2022   Lab Results  Component Value Date   TSH 1.330 12/05/2018    Therapeutic Level Labs: No results found for: "LITHIUM" No results found for: "CBMZ" No results found for: "VALPROATE"  Current Medications: Current Outpatient Medications  Medication Sig Dispense Refill   LORazepam (ATIVAN) 0.5 MG tablet 2  qam  1  qhs   1  PRN 120 tablet 4   acetaminophen (TYLENOL) 325 MG tablet Take 650 mg by mouth every 6 (six) hours as needed for mild pain.     amLODipine (NORVASC) 10 MG tablet TAKE ONE (1) TABLET EACH DAY 90 tablet 3   atorvastatin (LIPITOR) 80 MG tablet TAKE 1/2 TABLET DAILY AT 6PM 45 tablet 3   beta carotene 25000 UNIT capsule Take 25,000 Units by mouth daily.     Blood Glucose Calibration (OT ULTRA/FASTTK CNTRL SOLN) SOLN USE WITH METER AS DIRECTED DX E11.9 1 each 1   blood glucose meter kit and supplies Dispense based on patient and insurance preference. Use up to four times daily as directed. (FOR ICD-10 E10.9, E11.9). 1 each 1   busPIRone (BUSPAR) 15 MG tablet 2 qam   2  qhs 120 tablet 5   chlorthalidone (HYGROTON) 25 MG tablet Take 0.5 tablets (12.5 mg total) by mouth daily. 45 tablet 3   Cholecalciferol (VITAMIN D) 2000 UNITS CAPS Take 1 capsule by mouth daily.     FeFum-FePo-FA-B Cmp-C-Zn-Mn-Cu (SE-TAN PLUS) 162-115.2-1 MG CAPS Take 1 capsule by mouth 2 (two) times daily. 180 capsule 3   fish oil-omega-3 fatty acids 1000 MG capsule Take 1 g by mouth 2 (two) times daily.      glucose blood (ONETOUCH ULTRA) test strip Test BS up to 4 times  daily Dx E11.9 400 strip 3   losartan (COZAAR) 100 MG tablet TAKE ONE (1) TABLET EACH DAY 90 tablet 3   metFORMIN (GLUCOPHAGE) 500 MG tablet Take 2 tablets (1,000 mg total) by mouth 2 (two) times daily with a meal. TAKE 2 TABLETS EACH MORNING AND 1 TABLETEACH EVENING 360 tablet 3   mirtazapine (REMERON SOL-TAB) 15 MG disintegrating tablet Take one 15 mg tablet each morning. 30 tablet 5   mirtazapine (REMERON) 30 MG tablet 1  qhs 30 tablet 5   Multiple Vitamins-Minerals (CENTRUM SILVER PO) Take 1 tablet by mouth daily.      omeprazole (PRILOSEC) 20 MG capsule Take 1 capsule (20 mg total) by mouth daily. 90 capsule 3   temazepam (RESTORIL) 15 MG capsule 2 qhs (Patient taking differently: 1 qhs- can take 2 per pt) 60 capsule 2   venlafaxine XR (EFFEXOR XR) 75 MG 24 hr capsule 3  qam 90 capsule 5   No current facility-administered medications for this visit.    Musculoskeletal: Strength & Muscle Tone: within normal limits Gait & Station: normal Patient leans: N/A  Psychiatric Specialty Exam: Review of Systems  There were no vitals taken for this visit.There is no height or weight on file to calculate BMI.  General Appearance: Casual  Eye Contact:  Good  Speech:  Clear and  Coherent  Volume:  Normal  Mood:  Depressed  Affect:  Appropriate  Thought Process:  Coherent  Orientation:  Full (Time, Place, and Person)  Thought Content:  WDL  Suicidal Thoughts:  No  Homicidal Thoughts:  No  Memory:  Negative  Judgement:  Good  Insight:  Good  Psychomotor Activity:  Normal  Concentration:    Recall:  Good  Fund of Knowledge:Fair  Language: Good  Akathisia:  No  Handed:  Right  AIMS (if indicated):  not done  Assets:  Desire for Improvement  ADL's:  Intact  Cognition: WNL  Sleep:  Good   Screenings: GAD-7    Flowsheet Row Office Visit from 05/14/2022 in [place] Visit from 02/15/2022 in [place] Visit from 11/04/2021  in [place] Visit from 08/03/2021 in [place] Visit from 03/31/2021 in [facility] [place]  Total GAD-7 Score _0 Mini-Mental    [place] from 05/16/2017 in [place]  Total Score (max 30 points ) 29      PHQ2-9    [place] Visit from 05/14/2022 in [place] Visit from 02/15/2022 in [place] Office Visit from 11/04/2021 in [place] Office Visit from 08/03/2021 in [place] from 07/15/2021 in [place]  PHQ-2 Total Score _1 PHQ-9 Total Score _2 Assessment and Plan:  11/1/20232:43 PM     This patient's first problem is major depression in remission.  We will continue taking Effexor 75 mg 3 in the morning continue taking Remeron 15 mg in the morning and 30 mg at night.  He will continue taking his BuSpar as well.  His adjustment to his diagnosis is adjustment disorder with an anxious mood in addition to major depression.  He will go ahead and continue taking the Ativan 0.5 mg 2 in the morning 1 at night and 1 as needed.  Return to see me in 3 months.  He really is doing quite well and I think as long as he keeps down the Ativan to 1 or 2 mg a day I have no problems.

## 2022-05-20 ENCOUNTER — Ambulatory Visit: Payer: Medicare Other | Admitting: Family Medicine

## 2022-08-03 ENCOUNTER — Other Ambulatory Visit: Payer: Self-pay | Admitting: Family Medicine

## 2022-08-18 ENCOUNTER — Encounter: Payer: Self-pay | Admitting: Family Medicine

## 2022-08-18 ENCOUNTER — Ambulatory Visit (INDEPENDENT_AMBULATORY_CARE_PROVIDER_SITE_OTHER): Payer: Medicare Other | Admitting: Family Medicine

## 2022-08-18 VITALS — BP 128/77 | HR 84 | Ht 63.0 in | Wt 177.0 lb

## 2022-08-18 DIAGNOSIS — I10 Essential (primary) hypertension: Secondary | ICD-10-CM | POA: Diagnosis not present

## 2022-08-18 DIAGNOSIS — E1159 Type 2 diabetes mellitus with other circulatory complications: Secondary | ICD-10-CM | POA: Diagnosis not present

## 2022-08-18 DIAGNOSIS — I493 Ventricular premature depolarization: Secondary | ICD-10-CM | POA: Diagnosis not present

## 2022-08-18 DIAGNOSIS — R7401 Elevation of levels of liver transaminase levels: Secondary | ICD-10-CM | POA: Insufficient documentation

## 2022-08-18 DIAGNOSIS — E1169 Type 2 diabetes mellitus with other specified complication: Secondary | ICD-10-CM

## 2022-08-18 DIAGNOSIS — I152 Hypertension secondary to endocrine disorders: Secondary | ICD-10-CM

## 2022-08-18 DIAGNOSIS — E782 Mixed hyperlipidemia: Secondary | ICD-10-CM | POA: Diagnosis not present

## 2022-08-18 LAB — CMP14+EGFR
ALT: 15 IU/L (ref 0–44)
AST: 57 IU/L — ABNORMAL HIGH (ref 0–40)
Albumin/Globulin Ratio: 2.6 — ABNORMAL HIGH (ref 1.2–2.2)
Albumin: 4.6 g/dL (ref 3.8–4.8)
Alkaline Phosphatase: 75 IU/L (ref 44–121)
BUN/Creatinine Ratio: 17 (ref 10–24)
BUN: 18 mg/dL (ref 8–27)
Bilirubin Total: 0.4 mg/dL (ref 0.0–1.2)
CO2: 25 mmol/L (ref 20–29)
Calcium: 9.9 mg/dL (ref 8.6–10.2)
Chloride: 101 mmol/L (ref 96–106)
Creatinine, Ser: 1.08 mg/dL (ref 0.76–1.27)
Globulin, Total: 1.8 g/dL (ref 1.5–4.5)
Glucose: 107 mg/dL — ABNORMAL HIGH (ref 70–99)
Potassium: 4.5 mmol/L (ref 3.5–5.2)
Sodium: 142 mmol/L (ref 134–144)
Total Protein: 6.4 g/dL (ref 6.0–8.5)
eGFR: 70 mL/min/{1.73_m2} (ref >59)

## 2022-08-18 LAB — LIPID PANEL
Chol/HDL Ratio: 2.4 ratio (ref 0.0–5.0)
Cholesterol, Total: 106 mg/dL (ref 100–199)
HDL: 45 mg/dL (ref >39)
LDL Chol Calc (NIH): 49 mg/dL (ref 0–99)
Triglycerides: 47 mg/dL (ref 0–149)
VLDL Cholesterol Cal: 12 mg/dL (ref 5–40)

## 2022-08-18 LAB — CBC WITH DIFFERENTIAL/PLATELET
Basophils Absolute: 0.1 10*3/uL (ref 0.0–0.2)
Basos: 2 %
EOS (ABSOLUTE): 0.2 10*3/uL (ref 0.0–0.4)
Eos: 4 %
Hematocrit: 35 % — ABNORMAL LOW (ref 37.5–51.0)
Hemoglobin: 11.9 g/dL — ABNORMAL LOW (ref 13.0–17.7)
Immature Grans (Abs): 0 10*3/uL (ref 0.0–0.1)
Immature Granulocytes: 0 %
Lymphocytes Absolute: 1.9 10*3/uL (ref 0.7–3.1)
Lymphs: 32 %
MCH: 33.1 pg — ABNORMAL HIGH (ref 26.6–33.0)
MCHC: 34 g/dL (ref 31.5–35.7)
MCV: 97 fL (ref 79–97)
Monocytes Absolute: 0.5 10*3/uL (ref 0.1–0.9)
Monocytes: 9 %
Neutrophils Absolute: 3.1 10*3/uL (ref 1.4–7.0)
Neutrophils: 53 %
Platelets: 257 10*3/uL (ref 150–450)
RBC: 3.6 x10E6/uL — ABNORMAL LOW (ref 4.14–5.80)
RDW: 11.9 % (ref 11.6–15.4)
WBC: 5.8 10*3/uL (ref 3.4–10.8)

## 2022-08-18 LAB — BAYER DCA HB A1C WAIVED: HB A1C (BAYER DCA - WAIVED): 6.9 % — ABNORMAL HIGH (ref 4.8–5.6)

## 2022-08-18 NOTE — Progress Notes (Signed)
BP 128/77   Pulse 84   Ht '5\' 3"'$  (1.6 m)   Wt 177 lb (80.3 kg)   SpO2 98%   BMI 31.35 kg/m    Subjective:   Patient ID: George Wang, male    DOB: [birth year], 77 y.o.   MRN: [id number]  HPI: George Wang is a 77 y.o. male presenting on 08/18/2022 for Medical Management of Chronic Issues, Diabetes, Hyperlipidemia, and Hypertension   HPI Type 2 diabetes mellitus Patient comes in today for recheck of his diabetes. Patient has been currently taking metformin. Patient is currently on an ACE inhibitor/ARB. Patient has not seen an ophthalmologist this year. Patient denies any issues with their feet. The symptom started onset as an adult hypertension and hyperlipidemia ARE RELATED TO DM   Hypertension Patient is currently on amlodipine and chlorthalidone and losartan, and their blood pressure today is 128/77. Patient denies any lightheadedness or dizziness. Patient denies headaches, blurred vision, chest pains, shortness of breath, or weakness. Denies any side effects from medication and is content with current medication.   Hyperlipidemia Patient is coming in for recheck of his hyperlipidemia. The patient is currently taking atorvastatin. They deny any issues with myalgias or history of liver damage from it. They deny any focal numbness or weakness or chest pain.   Patient has an irregular heartbeat and has been seen for by cardiology in the past diagnosis frequent PVCs  Relevant past medical, surgical, family and social history reviewed and updated as indicated. Interim medical history since our last visit reviewed. Allergies and medications reviewed and updated.  Review of Systems  Constitutional:  Negative for chills and fever.  Eyes:  Negative for visual disturbance.  Respiratory:  Negative for shortness of breath and wheezing.   Cardiovascular:  Negative for chest pain and leg swelling.  Musculoskeletal:  Negative for back pain and gait problem.  Skin:  Negative for rash.   Neurological:  Negative for dizziness, weakness and numbness.  All other systems reviewed and are negative.   Per HPI unless specifically indicated above   Allergies as of 08/18/2022   No Known Allergies      Medication List        Accurate as of August 18, 2022 10:40 AM. If you have any questions, ask your nurse or doctor.          acetaminophen 325 MG tablet Commonly known as: TYLENOL Take 650 mg by mouth every 6 (six) hours as needed for mild pain.   amLODipine 10 MG tablet Commonly known as: NORVASC TAKE ONE (1) TABLET EACH DAY   atorvastatin 80 MG tablet Commonly known as: LIPITOR TAKE 1/2 TABLET DAILY AT 6PM   beta carotene 25000 UNIT capsule Take 25,000 Units by mouth daily.   blood glucose meter kit and supplies Dispense based on patient and insurance preference. Use up to four times daily as directed. (FOR ICD-10 E10.9, E11.9).   busPIRone 15 MG tablet Commonly known as: BUSPAR 2 qam   2  qhs   CENTRUM SILVER PO Take 1 tablet by mouth daily.   chlorthalidone 25 MG tablet Commonly known as: HYGROTON Take 0.5 tablets (12.5 mg total) by mouth daily.   fish oil-omega-3 fatty acids 1000 MG capsule Take 1 g by mouth 2 (two) times daily.   LORazepam 0.5 MG tablet Commonly known as: Ativan 2  qam  1  qhs   1  PRN   losartan 100 MG tablet Commonly known as: COZAAR TAKE ONE (1)  TABLET EACH DAY   metFORMIN 500 MG tablet Commonly known as: GLUCOPHAGE Take 2 tablets (1,000 mg total) by mouth 2 (two) times daily with a meal. TAKE 2 TABLETS EACH MORNING AND 1 TABLETEACH EVENING   mirtazapine 15 MG disintegrating tablet Commonly known as: REMERON SOL-TAB Take one 15 mg tablet each morning.   mirtazapine 30 MG tablet Commonly known as: Remeron 1  qhs   omeprazole 20 MG capsule Commonly known as: PRILOSEC Take 1 capsule (20 mg total) by mouth daily.   OneTouch Ultra test strip Generic drug: glucose blood TEST BLOOD SUGARS UP TO 4 TIMES DAILY DX  E11.9   OT ULTRA/FASTTK CNTRL SOLN Soln USE WITH METER AS DIRECTED DX E11.9   Se-Tan PLUS 162-115.2-1 MG Caps Take 1 capsule by mouth 2 (two) times daily.   temazepam 15 MG capsule Commonly known as: RESTORIL 2 qhs What changed: additional instructions   venlafaxine XR 75 MG 24 hr capsule Commonly known as: Effexor XR 3  qam   Vitamin D 50 MCG (2000 UT) Caps Take 1 capsule by mouth daily.         Objective:   BP 128/77   Pulse 84   Ht '5\' 3"'$  (1.6 m)   Wt 177 lb (80.3 kg)   SpO2 98%   BMI 31.35 kg/m   Wt Readings from Last 3 Encounters:  08/18/22 177 lb (80.3 kg)  05/14/22 172 lb (78 kg)  02/15/22 169 lb (76.7 kg)    Physical Exam Vitals and nursing note reviewed.  Constitutional:      General: He is not in acute distress.    Appearance: He is well-developed. He is not diaphoretic.  Eyes:     General: No scleral icterus.    Conjunctiva/sclera: Conjunctivae normal.  Neck:     Thyroid: No thyromegaly.  Cardiovascular:     Rate and Rhythm: Normal rate. Rhythm irregular.     Heart sounds: Normal heart sounds. No murmur heard. Pulmonary:     Effort: Pulmonary effort is normal. No respiratory distress.     Breath sounds: Normal breath sounds. No wheezing.  Musculoskeletal:        General: No swelling. Normal range of motion.     Cervical back: Neck supple.  Lymphadenopathy:     Cervical: No cervical adenopathy.  Skin:    General: Skin is warm and dry.     Findings: No rash.  Neurological:     Mental Status: He is alert and oriented to person, place, and time.     Coordination: Coordination normal.  Psychiatric:        Behavior: Behavior normal.       Assessment & Plan:   Problem List Items Addressed This Visit       Cardiovascular and Mediastinum   Essential hypertension   Relevant Orders   CBC with Differential/Platelet   CMP14+EGFR   Lipid panel   Bayer DCA Hb A1c Waived   Frequent PVCs     Endocrine   Type 2 diabetes mellitus ([place]) -  Primary   Relevant Orders   CBC with Differential/Platelet   CMP14+EGFR   Lipid panel   Bayer DCA Hb A1c Waived     Other   Hyperlipemia   Elevated ALT measurement    A1c 6.9.  Focus on diet.  Blood pressure and everything else looks good, no other changes Follow up plan: Return in about 3 months (around 11/16/2022), or if symptoms worsen or fail to improve, for Diabetes and  hypertension recheck.  Counseling provided for all of the vaccine components Orders Placed This Encounter  Procedures   CBC with Differential/Platelet   CMP14+EGFR   Lipid panel   Bayer DCA Hb A1c Waived    Caryl Pina, MD [facility] 08/18/2022, 10:40 AM

## 2022-08-24 ENCOUNTER — Ambulatory Visit (HOSPITAL_BASED_OUTPATIENT_CLINIC_OR_DEPARTMENT_OTHER): Payer: Medicare Other | Admitting: Psychiatry

## 2022-08-24 DIAGNOSIS — F324 Major depressive disorder, single episode, in partial remission: Secondary | ICD-10-CM

## 2022-08-24 MED ORDER — MIRTAZAPINE 30 MG PO TABS: ORAL_TABLET | ORAL | 5 refills | Status: DC

## 2022-08-24 MED ORDER — VENLAFAXINE HCL ER 75 MG PO CP24: ORAL_CAPSULE | ORAL | 5 refills | Status: DC

## 2022-08-24 MED ORDER — MIRTAZAPINE 15 MG PO TBDP: ORAL_TABLET | ORAL | 5 refills | Status: DC

## 2022-08-24 MED ORDER — LORAZEPAM 0.5 MG PO TABS: ORAL_TABLET | ORAL | 4 refills | Status: DC

## 2022-08-24 MED ORDER — BUSPIRONE HCL 15 MG PO TABS: ORAL_TABLET | ORAL | 5 refills | Status: DC

## 2022-08-24 NOTE — Progress Notes (Signed)
Psychiatric Initial Adult Assessment   Patient Identification: George Wang MRN:  [id number] Date of Evaluation:  08/24/2022 Referral Source: Community/emergency room Chief Complaint:   Visit Diagnosis: Major depression  History of Present Illness:      Today once again the patient describes fluctuating depression.  The depression is not persistent.  It goes on for hours or days maybe 2.  Then for reasons he cannot explain it goes away.  It does occur all the time and he identifies that being from the fact that it is colder and therefore cannot go out and therefore cannot do the things he likes to do.  Therefore by limiting his activity because of the cold weather he started to have episodes of being sad or depressed.  The increase of Ativan has helped to some degree.  The patient is no longer in therapy.  Patient is sleeping and eating well.  He has got good energy.  He wants to work.  He complains of getting older.  He has minimal pain.  Again the patient consents why he is more depressed but it is only episodic and is only transient.  When he is working and doing things and staying active he has no depression.  He wakes up every morning and he feels some depression in the beginning the day then he has breakfast starts moving around he says he feels much better.  Again there is some times where he gets depressed for hours maybe even a day but it does not last.  The other possible stress is related to the fact that his wife is not well.  She has significant problems with her leg and back and she is limited.  He feels bad for her.  Depression Symptoms:  depressed mood, (Hypo) Manic Symptoms:   Anxiety Symptoms:  Excessive Worry, Psychotic Symptoms:   PTSD Symptoms:   Past Psychiatric History: 1 psychiatric hospitalization in 1989 presently on Lexapro 10 mg, BuSpar 30 mg  Previous Psychotropic Medications: Yes   Substance Abuse History in the last 12 months:  No.  Consequences of  Substance Abuse:   Past Medical History:  Past Medical History:  Diagnosis Date   Allergy    mild   Anemia    Iron deficiency   Anxiety state, unspecified    Aortic atherosclerosis ([place])    Arthritis    Calculus of kidney    kidney stones  - normal BMP    Cancer (HCC)    Prostate, skin cancer x 2    Cataract    forming both eyes   Depressive disorder, not elsewhere classified    Diverticulosis of colon (without mention of hemorrhage)    GERD (gastroesophageal reflux disease)    on nexium   Gout, unspecified    Heart murmur    Hyperlipidemia    Hypertension, essential    Irritable bowel syndrome    Neuromuscular disorder (HCC)    numbness in knees from back issues    Personal history of malignant neoplasm of prostate    PVC (premature ventricular contraction)    Type II or unspecified type diabetes mellitus without mention of complication, not stated as uncontrolled     Past Surgical History:  Procedure Laterality Date   COLONOSCOPY     HERNIA REPAIR     left side   POLYPECTOMY     PROSTATE SURGERY     skin cancer removed     chest x 2 3-4 yrs ago    TONSILLECTOMY  Family Psychiatric History:   Family History:  Family History  Problem Relation Age of Onset   Cancer Maternal Aunt        breast   Pancreatic cancer Paternal Aunt    Prostate cancer Paternal Uncle    Heart disease Maternal Grandmother 82   Heart disease Maternal Grandfather        fluid / CHF    Heart disease Paternal Grandfather 79   Heart attack Paternal Grandfather        passed at 52   Prostate cancer Father    Coronary artery disease Father 27       CABG   Hypertension Mother    Hyperlipidemia Mother    Other Daughter        accident with tornado 1996-10-09   Hypertension Son    Colon cancer Neg Hx    Colon polyps Neg Hx    Esophageal cancer Neg Hx    Rectal cancer Neg Hx    Stomach cancer Neg Hx     Social History:   Social History   Socioeconomic History   Marital status:  Married    Spouse name: [name]   Number of children: 2   Years of education: 75   Highest education level: Some college, no degree  Occupational History   Occupation: Production designer, theatre/television/film    Comment: Part time  Tobacco Use   Smoking status: Former    Packs/day: 1.00    Years: 19.00    Total pack years: 19.00    Types: Cigarettes    Quit date: 10/13/1978    Years since quitting: 43.8   Smokeless tobacco: Former    Types: Snuff    Quit date: 05/30/2008  Vaping Use   Vaping Use: Never used  Substance and Sexual Activity   Alcohol use: Not Currently   Drug use: No   Sexual activity: Yes  Other Topics Concern   Not on file  Social History Narrative   Lives at home with wife.  He has one son and a daughter that died in a tornado in 47. 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 He has one living grandson and one grandson that passed away in 10/09/16 at age 45 from cancer.   Social Determinants of Health   Financial Resource Strain: Low Risk  (07/15/2021)   Overall Financial Resource Strain (CARDIA)    Difficulty of Paying Living Expenses: Not hard at all  Food Insecurity: No Food Insecurity (07/15/2021)   Hunger Vital Sign    Worried About Running Out of Food in the Last Year: Never true    Ran Out of Food in the Last Year: Never true  Transportation Needs: No Transportation Needs (07/15/2021)   PRAPARE - Hydrologist (Medical): No    Lack of Transportation (Non-Medical): No  Physical Activity: Inactive (07/15/2021)   Exercise Vital Sign    Days of Exercise per Week: 0 days    Minutes of Exercise per Session: 0 min  Stress: No Stress Concern Present (07/15/2021)   [place]    Feeling of Stress : Only a little  Social Connections: Socially Integrated (07/15/2021)   Social Connection and Isolation Panel [NHANES]    Frequency of Communication with Friends and Family:  More than three times a week    Frequency of Social Gatherings with Friends and Family: Once a week    Attends Religious Services: More than  4 times per year    Active Member of Clubs or Organizations: Yes    Attends Archivist Meetings: 1 to 4 times per year    Marital Status: Married    Additional Social History:   Allergies:  No Known Allergies  Metabolic Disorder Labs: Lab Results  Component Value Date   HGBA1C 6.9 (H) 08/18/2022   No results found for: "PROLACTIN" Lab Results  Component Value Date   CHOL 106 08/18/2022   TRIG 47 08/18/2022   HDL 45 08/18/2022   CHOLHDL 2.4 08/18/2022   LDLCALC 49 08/18/2022   LDLCALC 68 05/14/2022   Lab Results  Component Value Date   TSH 1.330 12/05/2018    Therapeutic Level Labs: No results found for: "LITHIUM" No results found for: "CBMZ" No results found for: "VALPROATE"  Current Medications: Current Outpatient Medications  Medication Sig Dispense Refill   acetaminophen (TYLENOL) 325 MG tablet Take 650 mg by mouth every 6 (six) hours as needed for mild pain.     amLODipine (NORVASC) 10 MG tablet TAKE ONE (1) TABLET EACH DAY 90 tablet 3   atorvastatin (LIPITOR) 80 MG tablet TAKE 1/2 TABLET DAILY AT 6PM 45 tablet 3   beta carotene 25000 UNIT capsule Take 25,000 Units by mouth daily.     Blood Glucose Calibration (OT ULTRA/FASTTK CNTRL SOLN) SOLN USE WITH METER AS DIRECTED DX E11.9 1 each 1   blood glucose meter kit and supplies Dispense based on patient and insurance preference. Use up to four times daily as directed. (FOR ICD-10 E10.9, E11.9). 1 each 1   busPIRone (BUSPAR) 15 MG tablet 2 qam   2  qhs 120 tablet 5   chlorthalidone (HYGROTON) 25 MG tablet Take 0.5 tablets (12.5 mg total) by mouth daily. 45 tablet 3   Cholecalciferol (VITAMIN D) 2000 UNITS CAPS Take 1 capsule by mouth daily.     FeFum-FePo-FA-B Cmp-C-Zn-Mn-Cu (SE-TAN PLUS) 162-115.2-1 MG CAPS Take 1 capsule by mouth 2 (two) times daily. 180 capsule 3    fish oil-omega-3 fatty acids 1000 MG capsule Take 1 g by mouth 2 (two) times daily.      glucose blood (ONETOUCH ULTRA) test strip TEST BLOOD SUGARS UP TO 4 TIMES DAILY DX E11.9 400 strip 3   LORazepam (ATIVAN) 0.5 MG tablet 2  qam  1  qhs   1  PRN 120 tablet 4   losartan (COZAAR) 100 MG tablet TAKE ONE (1) TABLET EACH DAY 90 tablet 3   metFORMIN (GLUCOPHAGE) 500 MG tablet Take 2 tablets (1,000 mg total) by mouth 2 (two) times daily with a meal. TAKE 2 TABLETS EACH MORNING AND 1 TABLETEACH EVENING 360 tablet 3   mirtazapine (REMERON SOL-TAB) 15 MG disintegrating tablet Take one 15 mg tablet each morning. 30 tablet 5   mirtazapine (REMERON) 30 MG tablet 1  qhs 30 tablet 5   Multiple Vitamins-Minerals (CENTRUM SILVER PO) Take 1 tablet by mouth daily.      omeprazole (PRILOSEC) 20 MG capsule Take 1 capsule (20 mg total) by mouth daily. 90 capsule 3   temazepam (RESTORIL) 15 MG capsule 2 qhs (Patient taking differently: 1 qhs- can take 2 per pt) 60 capsule 2   venlafaxine XR (EFFEXOR XR) 75 MG 24 hr capsule 3  qam 90 capsule 5   No current facility-administered medications for this visit.    Musculoskeletal: Strength & Muscle Tone: within normal limits Gait & Station: normal Patient leans: N/A  Psychiatric Specialty Exam: Review of Systems  There were no vitals taken for this visit.There is no height or weight on file to calculate BMI.  General Appearance: Casual  Eye Contact:  Good  Speech:  Clear and Coherent  Volume:  Normal  Mood:  Depressed  Affect:  Appropriate  Thought Process:  Coherent  Orientation:  Full (Time, Place, and Person)  Thought Content:  WDL  Suicidal Thoughts:  No  Homicidal Thoughts:  No  Memory:  Negative  Judgement:  Good  Insight:  Good  Psychomotor Activity:  Normal  Concentration:    Recall:  Good  Fund of Knowledge:Fair  Language: Good  Akathisia:  No  Handed:  Right  AIMS (if indicated):  not done  Assets:  Desire for Improvement  ADL's:  Intact   Cognition: WNL  Sleep:  Good   Screenings: GAD-7    Flowsheet Row Office Visit from 08/18/2022 in [place] Office Visit from 05/14/2022 in [place] Office Visit from 02/15/2022 in [facility] Office Visit from 11/04/2021 in [place] Office Visit from 08/03/2021 in [facility]  Total GAD-7 Score '5 5 6 7 10      '$ Mini-Mental    [place] from 05/16/2017 in [place]  Total Score (max 30 points ) 29      PHQ2-9    [place] Office Visit from 08/18/2022 in [place] Office Visit from 05/14/2022 in [place] Office Visit from 02/15/2022 in [place] Office Visit from 11/04/2021 in [place] Office Visit from 08/03/2021 in [facility]  PHQ-2 Total Score '2 2 2 2 3  '$ PHQ-9 Total Score '5 4 6 6 7       '$ Assessment and Plan:  2/6/20241:46 PM     This patient's first problem is that of clinical major depression which is residual.  He will continue taking Effexor 75 mg 3 in the morning.  He also takes Remeron 15 mg 1 in the morning and 2 at night.  He takes BuSpar 15 mg 2 twice daily which is the maximum dose.  We did increase his Ativan and he takes now 0.5 mg 2 in the morning 1 midday and 1 as needed.  I shared with him that these were the maximum doses of these medications and has no reason to change anything at this time.  Our hope is that when I see him back in 3 months and the weather is warmer and he is more active that he is less short episodes of depression.  The patient is functioning very well.  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  He is thinking and concentrating well.

## 2022-09-03 ENCOUNTER — Telehealth: Payer: Self-pay | Admitting: Family Medicine

## 2022-09-03 NOTE — Telephone Encounter (Signed)
Called patient to schedule Medicare Annual Wellness Visit (AWV). Left message for patient to call back and schedule Medicare Annual Wellness Visit (AWV).  Last date of AWV: 07/15/2021   Please schedule an appointment at any time with [facility].  If any questions, please contact me at [phone].  Thank you ,  Thank you,  [place] ??[phone]

## 2022-09-22 ENCOUNTER — Other Ambulatory Visit: Payer: Self-pay

## 2022-09-22 ENCOUNTER — Other Ambulatory Visit: Payer: Medicare Other

## 2022-09-22 DIAGNOSIS — E291 Testicular hypofunction: Secondary | ICD-10-CM

## 2022-09-22 DIAGNOSIS — Z8546 Personal history of malignant neoplasm of prostate: Secondary | ICD-10-CM

## 2022-09-23 LAB — PSA: Prostate Specific Ag, Serum: <0.1 ng/mL (ref 0.0–4.0)

## 2022-09-23 LAB — TESTOSTERONE: Testosterone: 23 ng/dL — ABNORMAL LOW (ref 264–916)

## 2022-09-30 ENCOUNTER — Encounter: Payer: Self-pay | Admitting: Urology

## 2022-09-30 ENCOUNTER — Ambulatory Visit: Payer: Medicare Other | Admitting: Urology

## 2022-09-30 VITALS — BP 132/68 | HR 102 | Ht 63.0 in | Wt 178.0 lb

## 2022-09-30 DIAGNOSIS — N393 Stress incontinence (female) (male): Secondary | ICD-10-CM | POA: Diagnosis not present

## 2022-09-30 DIAGNOSIS — Z8546 Personal history of malignant neoplasm of prostate: Secondary | ICD-10-CM

## 2022-09-30 DIAGNOSIS — Z87442 Personal history of urinary calculi: Secondary | ICD-10-CM | POA: Diagnosis not present

## 2022-09-30 DIAGNOSIS — E291 Testicular hypofunction: Secondary | ICD-10-CM | POA: Diagnosis not present

## 2022-09-30 LAB — URINALYSIS, ROUTINE W REFLEX MICROSCOPIC
Bilirubin, UA: NEGATIVE
Glucose, UA: NEGATIVE
Leukocytes,UA: NEGATIVE
Nitrite, UA: NEGATIVE
Protein,UA: NEGATIVE
RBC, UA: NEGATIVE
Specific Gravity, UA: 1.025 (ref 1.005–1.030)
Urobilinogen, Ur: 1 mg/dL (ref 0.2–1.0)
pH, UA: 5.5 (ref 5.0–7.5)

## 2022-09-30 MED ORDER — TESTOSTERONE 20.25 MG/ACT (1.62%) TD GEL: TRANSDERMAL | 5 refills | Status: DC

## 2022-09-30 NOTE — Progress Notes (Signed)
Subjective:  1. Personal history of malignant neoplasm of prostate   2. Hypogonadism in male   3. Male stress incontinence   4. History of nephrolithiasis      [name] is a 77 year-old male established patient who is here for his history of prostate cancer  His most recent PSA is <0.1 on 09/22/22  His testosterone is 23.    George Wang returns in f/u for his history of prostate cancer treated in 09/2003 with radical prostatectomy. His PSA's have been undetectible. The PSA prior to this visit was <0.1. He is voiding well with minimal incontinence which has improved. he no longer wears a guard. He has some nocturia x 1-3. He has persistant ED. He is not on treatment. He has hypogonadism with a testosterone of 23.  He has a history of stones but has had no hematuria or flank pain.   He had a CT AP in 2/21 that showed no stones.  He has no associated signs or symptoms.  IPSS is 3.   IPSS     Row Name 09/30/22 1300         International Prostate Symptom Score   How often have you had the sensation of not emptying your bladder? Not at All     How often have you had to urinate less than every two hours? Not at All     How often have you found you stopped and started again several times when you urinated? Not at All     How often have you found it difficult to postpone urination? Not at All     How often have you had a weak urinary stream? Not at All     How often have you had to strain to start urination? Not at All     How many times did you typically get up at night to urinate? 2 Times     Total IPSS Score 2       Quality of Life due to urinary symptoms   If you were to spend the rest of your life with your urinary condition just the way it is now how would you feel about that? Delighted                    ROS:  ROS:  A complete review of systems was performed.  All systems are negative except for pertinent findings as noted.   Review of Systems  Musculoskeletal:  Positive  for back pain and joint pain.  Endo/Heme/Allergies:  Bruises/bleeds easily.  Psychiatric/Behavioral:  Positive for depression.   All other systems reviewed and are negative.   No Known Allergies  Outpatient Encounter Medications as of 09/30/2022  Medication Sig   acetaminophen (TYLENOL) 325 MG tablet Take 650 mg by mouth every 6 (six) hours as needed for mild pain.   amLODipine (NORVASC) 10 MG tablet TAKE ONE (1) TABLET EACH DAY   atorvastatin (LIPITOR) 80 MG tablet TAKE 1/2 TABLET DAILY AT 6PM   beta carotene 25000 UNIT capsule Take 25,000 Units by mouth daily.   Blood Glucose Calibration (OT ULTRA/FASTTK CNTRL SOLN) SOLN USE WITH METER AS DIRECTED DX E11.9   blood glucose meter kit and supplies Dispense based on patient and insurance preference. Use up to four times daily as directed. (FOR ICD-10 E10.9, E11.9).   busPIRone (BUSPAR) 15 MG tablet 2 qam   2  qhs   chlorthalidone (HYGROTON) 25 MG tablet Take 0.5 tablets (12.5 mg total) by mouth  daily.   Cholecalciferol (VITAMIN D) 2000 UNITS CAPS Take 1 capsule by mouth daily.   FeFum-FePo-FA-B Cmp-C-Zn-Mn-Cu (SE-TAN PLUS) 162-115.2-1 MG CAPS Take 1 capsule by mouth 2 (two) times daily.   fish oil-omega-3 fatty acids 1000 MG capsule Take 1 g by mouth 2 (two) times daily.    glucose blood (ONETOUCH ULTRA) test strip TEST BLOOD SUGARS UP TO 4 TIMES DAILY DX E11.9   LORazepam (ATIVAN) 0.5 MG tablet 2  qam  1  qhs   1  PRN   losartan (COZAAR) 100 MG tablet TAKE ONE (1) TABLET EACH DAY   metFORMIN (GLUCOPHAGE) 500 MG tablet Take 2 tablets (1,000 mg total) by mouth 2 (two) times daily with a meal. TAKE 2 TABLETS EACH MORNING AND 1 TABLETEACH EVENING   mirtazapine (REMERON SOL-TAB) 15 MG disintegrating tablet Take one 15 mg tablet each morning.   mirtazapine (REMERON) 30 MG tablet 1  qhs   Multiple Vitamins-Minerals (CENTRUM SILVER PO) Take 1 tablet by mouth daily.    omeprazole (PRILOSEC) 20 MG capsule Take 1 capsule (20 mg total) by mouth daily.    temazepam (RESTORIL) 15 MG capsule 2 qhs (Patient taking differently: 1 qhs- can take 2 per pt)   Testosterone 20.25 MG/ACT (1.62%) GEL 1 pump topically to each shoulder (2.5gm) qam   venlafaxine XR (EFFEXOR XR) 75 MG 24 hr capsule 3  qam   [DISCONTINUED] febuxostat (ULORIC) 40 MG tablet Take 80 mg by mouth daily.   [DISCONTINUED] simvastatin (ZOCOR) 80 MG tablet Take 80 mg by mouth at bedtime.   No facility-administered encounter medications on file as of 09/30/2022.    Past Medical History:  Diagnosis Date   Allergy    mild   Anemia    Iron deficiency   Anxiety state, unspecified    Aortic atherosclerosis (HCC)    Arthritis    Calculus of kidney    kidney stones  - normal BMP    Cancer (HCC)    Prostate, skin cancer x 2    Cataract    forming both eyes   Depressive disorder, not elsewhere classified    Diverticulosis of colon (without mention of hemorrhage)    GERD (gastroesophageal reflux disease)    on nexium   Gout, unspecified    Heart murmur    Hyperlipidemia    Hypertension, essential    Irritable bowel syndrome    Neuromuscular disorder (HCC)    numbness in knees from back issues    Personal history of malignant neoplasm of prostate    PVC (premature ventricular contraction)    Type II or unspecified type diabetes mellitus without mention of complication, not stated as uncontrolled     Past Surgical History:  Procedure Laterality Date   COLONOSCOPY     HERNIA REPAIR     left side   POLYPECTOMY     PROSTATE SURGERY     skin cancer removed     chest x 2 3-4 yrs ago    TONSILLECTOMY      Social History   Socioeconomic History   Marital status: Married    Spouse name: George Wang   Number of children: 2   Years of education: 13   Highest education level: Some college, no degree  Occupational History   Occupation: Production designer, theatre/television/film    Comment: Part time  Tobacco Use   Smoking status: Former    Packs/day: 1.00    Years: 19.00    Additional  pack years: 0.00  Total pack years: 19.00    Types: Cigarettes    Quit date: 10/13/1978    Years since quitting: 43.9   Smokeless tobacco: Former    Types: Snuff    Quit date: 05/30/2008  Vaping Use   Vaping Use: Never used  Substance and Sexual Activity   Alcohol use: Not Currently   Drug use: No   Sexual activity: Yes  Other Topics Concern   Not on file  Social History Narrative   Lives at home with wife.  He has one son and a daughter that died in a tornado in 39. 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 He has one living grandson and one grandson that passed away in 2016/10/30 at age 63 from cancer.   Social Determinants of Health   Financial Resource Strain: Low Risk  (07/15/2021)   Overall Financial Resource Strain (CARDIA)    Difficulty of Paying Living Expenses: Not hard at all  Food Insecurity: No Food Insecurity (07/15/2021)   Hunger Vital Sign    Worried About Running Out of Food in the Last Year: Never true    Ran Out of Food in the Last Year: Never true  Transportation Needs: No Transportation Needs (07/15/2021)   PRAPARE - Hydrologist (Medical): No    Lack of Transportation (Non-Medical): No  Physical Activity: Inactive (07/15/2021)   Exercise Vital Sign    Days of Exercise per Week: 0 days    Minutes of Exercise per Session: 0 min  Stress: No Stress Concern Present (07/15/2021)   [place]    Feeling of Stress : Only a little  Social Connections: Socially Integrated (07/15/2021)   Social Connection and Isolation Panel [NHANES]    Frequency of Communication with Friends and Family: More than three times a week    Frequency of Social Gatherings with Friends and Family: Once a week    Attends Religious Services: More than 4 times per year    Active Member of [employer] or Organizations: Yes    Attends Archivist Meetings: 1 to 4 times per year     Marital Status: Married  Human resources officer Violence: Not At Risk (07/06/2019)   Humiliation, Afraid, Rape, and Kick questionnaire    Fear of Current or Ex-Partner: No    Emotionally Abused: No    Physically Abused: No    Sexually Abused: No    Family History  Problem Relation Age of Onset   Cancer Maternal Aunt        breast   Pancreatic cancer Paternal Aunt    Prostate cancer Paternal Uncle    Heart disease Maternal Grandmother 69   Heart disease Maternal Grandfather        fluid / CHF    Heart disease Paternal Grandfather 54   Heart attack Paternal Grandfather        passed at 62   Prostate cancer Father    Coronary artery disease Father 31       CABG   Hypertension Mother    Hyperlipidemia Mother    Other Daughter        accident with tornado 30-Oct-1996   Hypertension Son    Colon cancer Neg Hx    Colon polyps Neg Hx    Esophageal cancer Neg Hx    Rectal cancer Neg Hx    Stomach cancer Neg Hx        Objective: Vitals:  09/30/22 1319  BP: 132/68  Pulse: (!) 102      Physical Exam  Lab Results:  Results for orders placed or performed in visit on 09/30/22 (from the past 24 hour(s))  Urinalysis, Routine w reflex microscopic     Status: Abnormal   Collection Time: 09/30/22  1:18 PM  Result Value Ref Range   Specific Gravity, UA 1.025 1.005 - 1.030   pH, UA 5.5 5.0 - 7.5   Color, UA Yellow Yellow   Appearance Ur Clear Clear   Leukocytes,UA Negative Negative   Protein,UA Negative Negative/Trace   Glucose, UA Negative Negative   Ketones, UA Trace (A) Negative   RBC, UA Negative Negative   Bilirubin, UA Negative Negative   Urobilinogen, Ur 1.0 0.2 - 1.0 mg/dL   Nitrite, UA Negative Negative   Microscopic Examination Comment    Narrative   Performed at:  [facility] [address] [place], [place]  [id number] Lab Director: Mina Marble MT, Phone:  [phone]      BMET No results for input(s): "NA", "K", "CL", "CO2", "GLUCOSE", "BUN",  "CREATININE", "CALCIUM" in the last 72 hours. PSA PSA  Date Value Ref Range Status  06/25/2014 <0.1 0.0 - 4.0 ng/mL Final    Comment:    Roche ECLIA methodology. According to the American Urological Association, Serum PSA should decrease and remain at undetectable levels after radical prostatectomy. The AUA defines biochemical recurrence as an initial PSA value 0.2 ng/mL or greater followed by a subsequent confirmatory PSA value 0.2 ng/mL or greater. Values obtained with different assay methods or kits cannot be used interchangeably. Results cannot be interpreted as absolute evidence of the presence or absence of malignant disease.   06/12/2013 <0.1 0.0 - 4.0 ng/mL Final    Comment:    Roche ECLIA methodology. According to the American Urological Association, Serum PSA should decrease and remain at undetectable levels after radical prostatectomy. The AUA defines biochemical recurrence as an initial PSA value 0.2 ng/mL or greater followed by a subsequent confirmatory PSA value 0.2 ng/mL or greater. Values obtained with different assay methods or kits cannot be used interchangeably. Results cannot be interpreted as absolute evidence of the presence or absence of malignant disease.   Testosterone  Date Value Ref Range Status  09/22/2022 23 (L) 264 - 916 ng/dL Final    Comment:    Adult male reference interval is based on a population of healthy nonobese males (BMI <30) between 70 and 77 years old. [place], [place] [phone]. PMID: 32440102.   08/27/2020 31 (L) 264 - 916 ng/dL Final    Comment:    Adult male reference interval is based on a population of healthy nonobese males (BMI <30) between 67 and 53 years old. [place], [place] [phone]. PMID: 95638756.   05/30/2014 51 (L) 348 - 1,197 ng/dL Final    Lab Results  Component Value Date   PSA1 <0.1 09/22/2022   PSA1 <0.1 08/26/2021   PSA1 <0.1 08/27/2020    UA is clear.  I have reviewed  his CMP and CBC and he has a stable chronic anemia with a HGB of 11.9 and he has a stable elevation of his AST at 57.  His Hgb A1c is 6.9.    Studies/Results: Prior records reviewed.     Assessment & Plan: 1. History of prostate cancer.   His PSA remains undetectible.    2. SUI.  He has minimal  leakage.   3. History of stones.  He  has had no stone symptoms.  CT in 2/21 was clear.  UA is clear.   4. Hypogonadism.   His T is only 23.  I think he would benefit from TRT and reviewed the risks in detail.  I will get him started on testosterone gel and repeat labs in a month and 3 months with an OV.    Meds ordered this encounter  Medications   Testosterone 20.25 MG/ACT (1.62%) GEL    Sig: 1 pump topically to each shoulder (2.5gm) qam    Dispense:  75 g    Refill:  5     Orders Placed This Encounter  Procedures   Urinalysis, Routine w reflex microscopic   PSA    Standing Status:   Future    Standing Expiration Date:   04/02/2023   Testosterone    Standing Status:   Future    Standing Expiration Date:   04/02/2023   Hemoglobin and hematocrit, blood    Standing Status:   Future    Standing Expiration Date:   04/02/2023   PSA    Standing Status:   Future    Standing Expiration Date:   04/02/2023   Testosterone    Standing Status:   Future    Standing Expiration Date:   04/02/2023   Hemoglobin and hematocrit, blood    Standing Status:   Future    Standing Expiration Date:   04/02/2023      Return in about 3 months (around 12/31/2022) for with a PSA, Hgb and testosterone in 1 month and 3 months.  .   CC: Dettinger, Fransisca Kaufmann, MD      George Wang 10/01/2022

## 2022-10-07 ENCOUNTER — Other Ambulatory Visit: Payer: Self-pay | Admitting: Family Medicine

## 2022-10-07 DIAGNOSIS — R140 Abdominal distension (gaseous): Secondary | ICD-10-CM

## 2022-10-07 DIAGNOSIS — E782 Mixed hyperlipidemia: Secondary | ICD-10-CM

## 2022-10-07 DIAGNOSIS — I10 Essential (primary) hypertension: Secondary | ICD-10-CM

## 2022-10-14 ENCOUNTER — Telehealth: Payer: Self-pay | Admitting: Family Medicine

## 2022-10-14 NOTE — Telephone Encounter (Signed)
Pts wife called stating that pt just tested positive for covid with home test. He has flu like symptoms. Wants to know what he can take OTC to help with his symptoms.   Can provider that's covering for Dettinger advise on this and let pts wife know?

## 2022-10-14 NOTE — Telephone Encounter (Signed)
Wife informed to to try Mucinex MD, OTC cough med, flonase, IBU or Tylenol. Will call back for an appt if no better.

## 2022-10-19 ENCOUNTER — Encounter: Payer: Self-pay | Admitting: Family Medicine

## 2022-10-19 ENCOUNTER — Ambulatory Visit (INDEPENDENT_AMBULATORY_CARE_PROVIDER_SITE_OTHER): Payer: Medicare Other | Admitting: Family Medicine

## 2022-10-19 VITALS — BP 138/73 | HR 96 | Temp 96.5°F | Ht 63.0 in | Wt 178.6 lb

## 2022-10-19 DIAGNOSIS — U071 COVID-19: Secondary | ICD-10-CM

## 2022-10-19 NOTE — Progress Notes (Signed)
Subjective:  Patient ID: George Wang, male    DOB: 01-11-1946, 77 y.o.   MRN: [id number]  Patient Care Team: Dettinger, Fransisca Kaufmann, MD as PCP - General (Family Medicine) Minus Breeding, MD as PCP - Cardiology (Cardiology) Druscilla Brownie, MD (Dermatology) Irine Seal, MD (Urology) Minus Breeding, MD (Cardiology) Pyrtle, Lajuan Lines, MD as Consulting Physician (Gastroenterology) Lavera Guise, [facility] as Pharmacist (Family Medicine)   Chief Complaint:  Covid Positive (Tested 3/28 and tested positive. /)   HPI: George Wang is a 77 y.o. male presenting on 10/19/2022 for Covid Positive (Tested 3/28 and tested positive. /)   Pt reports he had a cough, congestion, nausea, and mild weakness which started 6 days ago. States he has tested positive for COVID every day since 10/14/2022. He has been taking Tylenol and states he feels fine. He has been eating and drinking well. No fever, chills, or continued symptoms.         Relevant past medical, surgical, family, and social history reviewed and updated as indicated.  Allergies and medications reviewed and updated. Data reviewed: Chart in Epic.   Past Medical History:  Diagnosis Date   Allergy    mild   Anemia    Iron deficiency   Anxiety state, unspecified    Aortic atherosclerosis    Arthritis    Calculus of kidney    kidney stones  - normal BMP    Cancer    Prostate, skin cancer x 2    Cataract    forming both eyes   Depressive disorder, not elsewhere classified    Diverticulosis of colon (without mention of hemorrhage)    GERD (gastroesophageal reflux disease)    on nexium   Gout, unspecified    Heart murmur    Hyperlipidemia    Hypertension, essential    Irritable bowel syndrome    Neuromuscular disorder    numbness in knees from back issues    Personal history of malignant neoplasm of prostate    PVC (premature ventricular contraction)    Type II or unspecified type diabetes mellitus without mention of  complication, not stated as uncontrolled     Past Surgical History:  Procedure Laterality Date   COLONOSCOPY     HERNIA REPAIR     left side   POLYPECTOMY     PROSTATE SURGERY     skin cancer removed     chest x 2 3-4 yrs ago    TONSILLECTOMY      Social History   Socioeconomic History   Marital status: Married    Spouse name: [name]   Number of children: 2   Years of education: 13   Highest education level: Some college, no degree  Occupational History   Occupation: Production designer, theatre/television/film    Comment: Part time  Tobacco Use   Smoking status: Former    Packs/day: 1.00    Years: 19.00    Additional pack years: 0.00    Total pack years: 19.00    Types: Cigarettes    Quit date: 10/13/1978    Years since quitting: 44.0   Smokeless tobacco: Former    Types: Snuff    Quit date: 05/30/2008  Vaping Use   Vaping Use: Never used  Substance and Sexual Activity   Alcohol use: Not Currently   Drug use: No   Sexual activity: Yes  Other Topics Concern   Not on file  Social History Narrative   Lives at home with wife.  He has one son and a daughter that died in a tornado in 64. 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 He has one living grandson and one grandson that passed away in 2016-11-11 at age 39 from cancer.   Social Determinants of Health   Financial Resource Strain: Low Risk  (07/15/2021)   Overall Financial Resource Strain (CARDIA)    Difficulty of Paying Living Expenses: Not hard at all  Food Insecurity: No Food Insecurity (07/15/2021)   Hunger Vital Sign    Worried About Running Out of Food in the Last Year: Never true    Ran Out of Food in the Last Year: Never true  Transportation Needs: No Transportation Needs (07/15/2021)   PRAPARE - Hydrologist (Medical): No    Lack of Transportation (Non-Medical): No  Physical Activity: Inactive (07/15/2021)   Exercise Vital Sign    Days of Exercise per Week: 0 days    Minutes of  Exercise per Session: 0 min  Stress: No Stress Concern Present (07/15/2021)   [place]    Feeling of Stress : Only a little  Social Connections: Socially Integrated (07/15/2021)   Social Connection and Isolation Panel [NHANES]    Frequency of Communication with Friends and Family: More than three times a week    Frequency of Social Gatherings with Friends and Family: Once a week    Attends Religious Services: More than 4 times per year    Active Member of [employer] or Organizations: Yes    Attends Archivist Meetings: 1 to 4 times per year    Marital Status: Married  Human resources officer Violence: Not At Risk (07/06/2019)   Humiliation, Afraid, Rape, and Kick questionnaire    Fear of Current or Ex-Partner: No    Emotionally Abused: No    Physically Abused: No    Sexually Abused: No    Outpatient Encounter Medications as of 10/19/2022  Medication Sig   acetaminophen (TYLENOL) 325 MG tablet Take 650 mg by mouth every 6 (six) hours as needed for mild pain.   amLODipine (NORVASC) 10 MG tablet TAKE ONE (1) TABLET EACH DAY   atorvastatin (LIPITOR) 80 MG tablet TAKE 1/2 TABLET DAILY AT 6PM   beta carotene 25000 UNIT capsule Take 25,000 Units by mouth daily.   Blood Glucose Calibration (OT ULTRA/FASTTK CNTRL SOLN) SOLN USE WITH METER AS DIRECTED DX E11.9   blood glucose meter kit and supplies Dispense based on patient and insurance preference. Use up to four times daily as directed. (FOR ICD-10 E10.9, E11.9).   busPIRone (BUSPAR) 15 MG tablet 2 qam   2  qhs   chlorthalidone (HYGROTON) 25 MG tablet Take 0.5 tablets (12.5 mg total) by mouth daily.   Cholecalciferol (VITAMIN D) 2000 UNITS CAPS Take 1 capsule by mouth daily.   FeFum-FePo-FA-B Cmp-C-Zn-Mn-Cu (SE-TAN PLUS) 162-115.2-1 MG CAPS Take 1 capsule by mouth 2 (two) times daily.   fish oil-omega-3 fatty acids 1000 MG capsule Take 1 g by mouth 2 (two) times daily.     glucose blood (ONETOUCH ULTRA) test strip TEST BLOOD SUGARS UP TO 4 TIMES DAILY DX E11.9   LORazepam (ATIVAN) 0.5 MG tablet 2  qam  1  qhs   1  PRN   losartan (COZAAR) 100 MG tablet TAKE ONE (1) TABLET EACH DAY   metFORMIN (GLUCOPHAGE) 500 MG tablet Take 2 tablets (1,000 mg total) by mouth 2 (two) times daily with  a meal. TAKE 2 TABLETS EACH MORNING AND 1 TABLETEACH EVENING   mirtazapine (REMERON SOL-TAB) 15 MG disintegrating tablet Take one 15 mg tablet each morning.   mirtazapine (REMERON) 30 MG tablet 1  qhs   Multiple Vitamins-Minerals (CENTRUM SILVER PO) Take 1 tablet by mouth daily.    omeprazole (PRILOSEC) 20 MG capsule TAKE ONE CAPSULE BY MOUTH DAILY   temazepam (RESTORIL) 15 MG capsule 2 qhs (Patient taking differently: 1 qhs- can take 2 per pt)   Testosterone 20.25 MG/ACT (1.62%) GEL 1 pump topically to each shoulder (2.5gm) qam   venlafaxine XR (EFFEXOR XR) 75 MG 24 hr capsule 3  qam   [DISCONTINUED] febuxostat (ULORIC) 40 MG tablet Take 80 mg by mouth daily.   [DISCONTINUED] simvastatin (ZOCOR) 80 MG tablet Take 80 mg by mouth at bedtime.   No facility-administered encounter medications on file as of 10/19/2022.    No Known Allergies  Review of Systems  Constitutional:  Negative for activity change, appetite change, chills, diaphoresis, fatigue, fever and unexpected weight change.  HENT: Negative.    Eyes: Negative.   Respiratory:  Negative for cough, chest tightness and shortness of breath.   Cardiovascular:  Negative for chest pain, palpitations and leg swelling.  Gastrointestinal:  Negative for blood in stool, constipation, diarrhea, nausea and vomiting.  Endocrine: Negative.   Genitourinary:  Negative for decreased urine volume, difficulty urinating, dysuria, frequency and urgency.  Musculoskeletal:  Negative for arthralgias and myalgias.  Skin: Negative.   Allergic/Immunologic: Negative.   Neurological:  Negative for dizziness, weakness and headaches.  Hematological:  Negative.   Psychiatric/Behavioral:  Negative for confusion, hallucinations, sleep disturbance and suicidal ideas.   All other systems reviewed and are negative.       Objective:  BP 138/73   Pulse 96   Temp (!) 96.5 F (35.8 C) (Temporal)   Ht 5\' 3"  (1.6 m)   Wt 178 lb 9.6 oz (81 kg)   SpO2 98%   BMI 31.64 kg/m    Wt Readings from Last 3 Encounters:  10/19/22 178 lb 9.6 oz (81 kg)  09/30/22 178 lb (80.7 kg)  08/18/22 177 lb (80.3 kg)    Physical Exam Vitals and nursing note reviewed.  Constitutional:      General: He is not in acute distress.    Appearance: Normal appearance. He is well-developed and well-groomed. He is not ill-appearing, toxic-appearing or diaphoretic.  HENT:     Head: Normocephalic and atraumatic.     Jaw: There is normal jaw occlusion.     Right Ear: Hearing, tympanic membrane, ear canal and external ear normal.     Left Ear: Hearing, tympanic membrane, ear canal and external ear normal.     Nose: Nose normal.     Mouth/Throat:     Lips: Pink.     Mouth: Mucous membranes are moist.     Pharynx: Oropharynx is clear. Uvula midline.  Eyes:     General: Lids are normal.     Extraocular Movements: Extraocular movements intact.     Conjunctiva/sclera: Conjunctivae normal.     Pupils: Pupils are equal, round, and reactive to light.  Neck:     Thyroid: No thyroid mass, thyromegaly or thyroid tenderness.     Vascular: No carotid bruit or JVD.     Trachea: Trachea and phonation normal.  Cardiovascular:     Rate and Rhythm: Normal rate. Rhythm regularly irregular.     Chest Wall: PMI is not displaced.  Pulses: Normal pulses.     Heart sounds: Murmur heard.     Systolic murmur is present with a grade of 1/6.     No friction rub. No gallop.  Pulmonary:     Effort: Pulmonary effort is normal. No respiratory distress.     Breath sounds: Normal breath sounds. No stridor. No wheezing or rhonchi.  Abdominal:     General: Bowel sounds are normal. There  is no distension or abdominal bruit.     Palpations: Abdomen is soft. There is no hepatomegaly or splenomegaly.     Tenderness: There is no abdominal tenderness. There is no right CVA tenderness or left CVA tenderness.     Hernia: No hernia is present.  Musculoskeletal:        General: Normal range of motion.     Cervical back: Normal range of motion and neck supple.     Right lower leg: No edema.     Left lower leg: No edema.  Lymphadenopathy:     Cervical: No cervical adenopathy.  Skin:    General: Skin is warm and dry.     Capillary Refill: Capillary refill takes less than 2 seconds.     Coloration: Skin is not cyanotic, jaundiced or pale.     Findings: No rash.  Neurological:     General: No focal deficit present.     Mental Status: He is alert and oriented to person, place, and time.     Sensory: Sensation is intact.     Motor: Motor function is intact.     Coordination: Coordination is intact.     Gait: Gait is intact.     Deep Tendon Reflexes: Reflexes are normal and symmetric.  Psychiatric:        Attention and Perception: Attention and perception normal.        Mood and Affect: Mood and affect normal.        Speech: Speech normal.        Behavior: Behavior normal. Behavior is cooperative.        Thought Content: Thought content normal.        Cognition and Memory: Cognition and memory normal.        Judgment: Judgment normal.     Results for orders placed or performed in visit on 09/30/22  Urinalysis, Routine w reflex microscopic  Result Value Ref Range   Specific Gravity, UA 1.025 1.005 - 1.030   pH, UA 5.5 5.0 - 7.5   Color, UA Yellow Yellow   Appearance Ur Clear Clear   Leukocytes,UA Negative Negative   Protein,UA Negative Negative/Trace   Glucose, UA Negative Negative   Ketones, UA Trace (A) Negative   RBC, UA Negative Negative   Bilirubin, UA Negative Negative   Urobilinogen, Ur 1.0 0.2 - 1.0 mg/dL   Nitrite, UA Negative Negative   Microscopic  Examination Comment        Pertinent labs & imaging results that were available during my care of the patient were reviewed by me and considered in my medical decision making.  Assessment & Plan:  Kelyn was seen today for covid positive.  Diagnoses and all orders for this visit:  Positive self-administered antigen test for COVID-19 On day 6 of symptoms. Asymptomatic in office today. Quarantine guidelines discussed in detail. Aware to report any return of symptoms.     Continue all other maintenance medications.  Follow up plan: Return if symptoms worsen or fail to improve.   Continue healthy lifestyle choices,  including diet (rich in fruits, vegetables, and lean proteins, and low in salt and simple carbohydrates) and exercise (at least 30 minutes of moderate physical activity daily).  Educational handout given for COVID-19  The above assessment and management plan was discussed with the patient. The patient verbalized understanding of and has agreed to the management plan. Patient is aware to call the clinic if they develop any new symptoms or if symptoms persist or worsen. Patient is aware when to return to the clinic for a follow-up visit. Patient educated on when it is appropriate to go to the emergency department.   Monia Pouch, FNP-C [facility] [phone]

## 2022-10-20 ENCOUNTER — Ambulatory Visit (INDEPENDENT_AMBULATORY_CARE_PROVIDER_SITE_OTHER): Payer: Medicare Other

## 2022-10-20 VITALS — Ht 63.0 in | Wt 178.0 lb

## 2022-10-20 DIAGNOSIS — Z0000 Encounter for general adult medical examination without abnormal findings: Secondary | ICD-10-CM | POA: Diagnosis not present

## 2022-10-20 NOTE — Patient Instructions (Signed)
George Wang , Thank you for taking time to come for your Medicare Wellness Visit. I appreciate your ongoing commitment to your health goals. Please review the following plan we discussed and let me know if I can assist you in the future.   These are the goals we discussed:  Goals      DIET - INCREASE WATER INTAKE     Try to drink 6-8 glasses of water daily.     Exercise 150 min/wk Moderate Activity     Chair exercises or water exercises      Have 3 meals a day     Plan 3 meals a day. Have a larger breakfast, medium lunch, and smaller supper. This will help balance out your blood sugar. Eat mostly vegetables and lean proteins. Small amounts of low sugar fruits are also ago. Avoid very ripe fruits.         This is a list of the screening recommended for you and due dates:  Health Maintenance  Topic Date Due   Hemoglobin A1C  02/16/2023   Flu Shot  02/17/2023   Eye exam for diabetics  02/19/2023   Yearly kidney health urinalysis for diabetes  05/15/2023   Complete foot exam   05/15/2023   Yearly kidney function blood test for diabetes  08/19/2023   Medicare Annual Wellness Visit  10/20/2023   DTaP/Tdap/Td vaccine (4 - Td or Tdap) 08/04/2031   Pneumonia Vaccine  Completed   COVID-19 Vaccine  Completed   Hepatitis C Screening: USPSTF Recommendation to screen - Ages 16-79 yo.  Completed   Zoster (Shingles) Vaccine  Completed   HPV Vaccine  Aged Out   Colon Cancer Screening  Discontinued    Advanced directives: We have a copy of your advanced directives available in your record should your provider ever need to access them.   Conditions/risks identified: Aim for 30 minutes of exercise or brisk walking, 6-8 glasses of water, and 5 servings of fruits and vegetables each day.   Next appointment: Follow up in one year for your annual wellness visit.   Preventive Care 79 Years and Older, Male  Preventive care refers to lifestyle choices and visits with your health care provider  that can promote health and wellness. What does preventive care include? A yearly physical exam. This is also called an annual well check. Dental exams once or twice a year. Routine eye exams. Ask your health care provider how often you should have your eyes checked. Personal lifestyle choices, including: Daily care of your teeth and gums. Regular physical activity. Eating a healthy diet. Avoiding tobacco and drug use. Limiting alcohol use. Practicing safe sex. Taking low doses of aspirin every day. Taking vitamin and mineral supplements as recommended by your health care provider. What happens during an annual well check? The services and screenings done by your health care provider during your annual well check will depend on your age, overall health, lifestyle risk factors, and family history of disease. Counseling  Your health care provider may ask you questions about your: Alcohol use. Tobacco use. Drug use. Emotional well-being. Home and relationship well-being. Sexual activity. Eating habits. History of falls. Memory and ability to understand (cognition). Work and work Statistician. Screening  You may have the following tests or measurements: Height, weight, and BMI. Blood pressure. Lipid and cholesterol levels. These may be checked every 5 years, or more frequently if you are over 99 years old. Skin check. Lung cancer screening. You may have this screening every  year starting at age 25 if you have a 30-pack-year history of smoking and currently smoke or have quit within the past 15 years. Fecal occult blood test (FOBT) of the stool. You may have this test every year starting at age 57. Flexible sigmoidoscopy or colonoscopy. You may have a sigmoidoscopy every 5 years or a colonoscopy every 10 years starting at age 67. Prostate cancer screening. Recommendations will vary depending on your family history and other risks. Hepatitis C blood test. Hepatitis B blood  test. Sexually transmitted disease (STD) testing. Diabetes screening. This is done by checking your blood sugar (glucose) after you have not eaten for a while (fasting). You may have this done every 1-3 years. Abdominal aortic aneurysm (AAA) screening. You may need this if you are a current or former smoker. Osteoporosis. You may be screened starting at age 26 if you are at high risk. Talk with your health care provider about your test results, treatment options, and if necessary, the need for more tests. Vaccines  Your health care provider may recommend certain vaccines, such as: Influenza vaccine. This is recommended every year. Tetanus, diphtheria, and acellular pertussis (Tdap, Td) vaccine. You may need a Td booster every 10 years. Zoster vaccine. You may need this after age 41. Pneumococcal 13-valent conjugate (PCV13) vaccine. One dose is recommended after age 32. Pneumococcal polysaccharide (PPSV23) vaccine. One dose is recommended after age 48. Talk to your health care provider about which screenings and vaccines you need and how often you need them. This information is not intended to replace advice given to you by your health care provider. Make sure you discuss any questions you have with your health care provider. Document Released: 08/01/2015 Document Revised: 03/24/2016 Document Reviewed: 05/06/2015 Elsevier Interactive Patient Education  2017 [place] Prevention in the Home Falls can cause injuries. They can happen to people of all ages. There are many things you can do to make your home safe and to help prevent falls. What can I do on the outside of my home? Regularly fix the edges of walkways and driveways and fix any cracks. Remove anything that might make you trip as you walk through a door, such as a raised step or threshold. Trim any bushes or trees on the path to your home. Use bright outdoor lighting. Clear any walking paths of anything that might make  someone trip, such as rocks or tools. Regularly check to see if handrails are loose or broken. Make sure that both sides of any steps have handrails. Any raised decks and porches should have guardrails on the edges. Have any leaves, snow, or ice cleared regularly. Use sand or salt on walking paths during winter. Clean up any spills in your garage right away. This includes oil or grease spills. What can I do in the bathroom? Use night lights. Install grab bars by the toilet and in the tub and shower. Do not use towel bars as grab bars. Use non-skid mats or decals in the tub or shower. If you need to sit down in the shower, use a plastic, non-slip stool. Keep the floor dry. Clean up any water that spills on the floor as soon as it happens. Remove soap buildup in the tub or shower regularly. Attach bath mats securely with double-sided non-slip rug tape. Do not have throw rugs and other things on the floor that can make you trip. What can I do in the bedroom? Use night lights. Make sure that you have a  light by your bed that is easy to reach. Do not use any sheets or blankets that are too big for your bed. They should not hang down onto the floor. Have a firm chair that has side arms. You can use this for support while you get dressed. Do not have throw rugs and other things on the floor that can make you trip. What can I do in the kitchen? Clean up any spills right away. Avoid walking on wet floors. Keep items that you use a lot in easy-to-reach places. If you need to reach something above you, use a strong step stool that has a grab bar. Keep electrical cords out of the way. Do not use floor polish or wax that makes floors slippery. If you must use wax, use non-skid floor wax. Do not have throw rugs and other things on the floor that can make you trip. What can I do with my stairs? Do not leave any items on the stairs. Make sure that there are handrails on both sides of the stairs and  use them. Fix handrails that are broken or loose. Make sure that handrails are as long as the stairways. Check any carpeting to make sure that it is firmly attached to the stairs. Fix any carpet that is loose or worn. Avoid having throw rugs at the top or bottom of the stairs. If you do have throw rugs, attach them to the floor with carpet tape. Make sure that you have a light switch at the top of the stairs and the bottom of the stairs. If you do not have them, ask someone to add them for you. What else can I do to help prevent falls? Wear shoes that: Do not have high heels. Have rubber bottoms. Are comfortable and fit you well. Are closed at the toe. Do not wear sandals. If you use a stepladder: Make sure that it is fully opened. Do not climb a closed stepladder. Make sure that both sides of the stepladder are locked into place. Ask someone to hold it for you, if possible. Clearly mark and make sure that you can see: Any grab bars or handrails. First and last steps. Where the edge of each step is. Use tools that help you move around (mobility aids) if they are needed. These include: Canes. Walkers. Scooters. Crutches. Turn on the lights when you go into a dark area. Replace any light bulbs as soon as they burn out. Set up your furniture so you have a clear path. Avoid moving your furniture around. If any of your floors are uneven, fix them. If there are any pets around you, be aware of where they are. Review your medicines with your doctor. Some medicines can make you feel dizzy. This can increase your chance of falling. Ask your doctor what other things that you can do to help prevent falls. This information is not intended to replace advice given to you by your health care provider. Make sure you discuss any questions you have with your health care provider. Document Released: 05/01/2009 Document Revised: 12/11/2015 Document Reviewed: 08/09/2014 Elsevier Interactive Patient  Education  2017 [employer].

## 2022-10-20 NOTE — Progress Notes (Signed)
Subjective:   George Wang is a 77 y.o. male who presents for Medicare Annual/Subsequent preventive examination.  Review of Systems     Cardiac Risk Factors include: advanced age (>85men, >55 women);diabetes mellitus;dyslipidemia;hypertension;male gender     Objective:    Today's Vitals   10/20/22 1512  Weight: 178 lb (80.7 kg)  Height: 5\' 3"  (1.6 m)   Body mass index is 31.53 kg/m.     10/20/2022    3:17 PM 07/15/2021   11:01 AM 09/18/2019    1:28 PM 09/16/2019    6:06 PM 07/06/2019   10:43 AM 07/13/2018    9:17 AM 06/20/2018   12:49 PM  Advanced Directives  Does Patient Have a Medical Advance Directive? Yes Yes  Yes Yes No No  Type of Advance Directive Living will;Healthcare Power of [place];Living will  [place];Living will Living will;Healthcare Power of Attorney    Does patient want to make changes to medical advance directive? No - Patient declined No - Patient declined   No - Patient declined    Copy of [place] in Chart? Yes - validated most recent copy scanned in chart (See row information) No - copy requested  No - copy requested No - copy requested    Would patient like information on creating a medical advance directive?            Information is confidential and restricted. Go to Review Flowsheets to unlock data.    Current Medications (verified) Outpatient Encounter Medications as of 10/20/2022  Medication Sig   acetaminophen (TYLENOL) 325 MG tablet Take 650 mg by mouth every 6 (six) hours as needed for mild pain.   amLODipine (NORVASC) 10 MG tablet TAKE ONE (1) TABLET EACH DAY   atorvastatin (LIPITOR) 80 MG tablet TAKE 1/2 TABLET DAILY AT 6PM   beta carotene 25000 UNIT capsule Take 25,000 Units by mouth daily.   Blood Glucose Calibration (OT ULTRA/FASTTK CNTRL SOLN) SOLN USE WITH METER AS DIRECTED DX E11.9   blood glucose meter kit and supplies Dispense based on patient and insurance  preference. Use up to four times daily as directed. (FOR ICD-10 E10.9, E11.9).   busPIRone (BUSPAR) 15 MG tablet 2 qam   2  qhs   chlorthalidone (HYGROTON) 25 MG tablet Take 0.5 tablets (12.5 mg total) by mouth daily.   Cholecalciferol (VITAMIN D) 2000 UNITS CAPS Take 1 capsule by mouth daily.   FeFum-FePo-FA-B Cmp-C-Zn-Mn-Cu (SE-TAN PLUS) 162-115.2-1 MG CAPS Take 1 capsule by mouth 2 (two) times daily.   fish oil-omega-3 fatty acids 1000 MG capsule Take 1 g by mouth 2 (two) times daily.    glucose blood (ONETOUCH ULTRA) test strip TEST BLOOD SUGARS UP TO 4 TIMES DAILY DX E11.9   LORazepam (ATIVAN) 0.5 MG tablet 2  qam  1  qhs   1  PRN   losartan (COZAAR) 100 MG tablet TAKE ONE (1) TABLET EACH DAY   metFORMIN (GLUCOPHAGE) 500 MG tablet Take 2 tablets (1,000 mg total) by mouth 2 (two) times daily with a meal. TAKE 2 TABLETS EACH MORNING AND 1 TABLETEACH EVENING   mirtazapine (REMERON SOL-TAB) 15 MG disintegrating tablet Take one 15 mg tablet each morning.   mirtazapine (REMERON) 30 MG tablet 1  qhs   Multiple Vitamins-Minerals (CENTRUM SILVER PO) Take 1 tablet by mouth daily.    omeprazole (PRILOSEC) 20 MG capsule TAKE ONE CAPSULE BY MOUTH DAILY   temazepam (RESTORIL) 15 MG capsule  2 qhs (Patient taking differently: 1 qhs- can take 2 per pt)   Testosterone 20.25 MG/ACT (1.62%) GEL 1 pump topically to each shoulder (2.5gm) qam   venlafaxine XR (EFFEXOR XR) 75 MG 24 hr capsule 3  qam   [DISCONTINUED] febuxostat (ULORIC) 40 MG tablet Take 80 mg by mouth daily.   [DISCONTINUED] simvastatin (ZOCOR) 80 MG tablet Take 80 mg by mouth at bedtime.   No facility-administered encounter medications on file as of 10/20/2022.    Allergies (verified) Patient has no known allergies.   History: Past Medical History:  Diagnosis Date   Allergy    mild   Anemia    Iron deficiency   Anxiety state, unspecified    Aortic atherosclerosis    Arthritis    Calculus of kidney    kidney stones  - normal BMP     Cancer    Prostate, skin cancer x 2    Cataract    forming both eyes   Depressive disorder, not elsewhere classified    Diverticulosis of colon (without mention of hemorrhage)    GERD (gastroesophageal reflux disease)    on nexium   Gout, unspecified    Heart murmur    Hyperlipidemia    Hypertension, essential    Irritable bowel syndrome    Neuromuscular disorder    numbness in knees from back issues    Personal history of malignant neoplasm of prostate    PVC (premature ventricular contraction)    Type II or unspecified type diabetes mellitus without mention of complication, not stated as uncontrolled    Past Surgical History:  Procedure Laterality Date   COLONOSCOPY     HERNIA REPAIR     left side   POLYPECTOMY     PROSTATE SURGERY     skin cancer removed     chest x 2 3-4 yrs ago    TONSILLECTOMY     Family History  Problem Relation Age of Onset   Cancer Maternal Aunt        breast   Pancreatic cancer Paternal Aunt    Prostate cancer Paternal Uncle    Heart disease Maternal Grandmother 69   Heart disease Maternal Grandfather        fluid / CHF    Heart disease Paternal Grandfather 47   Heart attack Paternal Grandfather        passed at 41   Prostate cancer Father    Coronary artery disease Father 61       CABG   Hypertension Mother    Hyperlipidemia Mother    Other Daughter        accident with tornado 1998   Hypertension Son    Colon cancer Neg Hx    Colon polyps Neg Hx    Esophageal cancer Neg Hx    Rectal cancer Neg Hx    Stomach cancer Neg Hx    Social History   Socioeconomic History   Marital status: Married    Spouse name: [name]   Number of children: 2   Years of education: 13   Highest education level: Some college, no degree  Occupational History   Occupation: Production designer, theatre/television/film    Comment: Part time  Tobacco Use   Smoking status: Former    Packs/day: 1.00    Years: 19.00    Additional pack years: 0.00    Total pack years:  19.00    Types: Cigarettes    Quit date: 10/13/1978    Years since quitting: 29.0  Smokeless tobacco: Former    Types: Snuff    Quit date: 05/30/2008  Vaping Use   Vaping Use: Never used  Substance and Sexual Activity   Alcohol use: Not Currently   Drug use: No   Sexual activity: Yes  Other Topics Concern   Not on file  Social History Narrative   Lives at home with wife.  He has one son and a daughter that died in a tornado in 41. 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 He has one living grandson and one grandson that passed away in 11/10/16 at age 51 from cancer.   Social Determinants of Health   Financial Resource Strain: Low Risk  (10/20/2022)   Overall Financial Resource Strain (CARDIA)    Difficulty of Paying Living Expenses: Not hard at all  Food Insecurity: No Food Insecurity (10/20/2022)   Hunger Vital Sign    Worried About Running Out of Food in the Last Year: Never true    Ran Out of Food in the Last Year: Never true  Transportation Needs: No Transportation Needs (10/20/2022)   PRAPARE - Hydrologist (Medical): No    Lack of Transportation (Non-Medical): No  Physical Activity: Inactive (10/20/2022)   Exercise Vital Sign    Days of Exercise per Week: 0 days    Minutes of Exercise per Session: 0 min  Stress: No Stress Concern Present (10/20/2022)   Ballplay    Feeling of Stress : Not at all  Social Connections: [place] (10/20/2022)   Social Connection and Isolation Panel [NHANES]    Frequency of Communication with Friends and Family: More than three times a week    Frequency of Social Gatherings with Friends and Family: Once a week    Attends Religious Services: More than 4 times per year    Active Member of [employer] or Organizations: Yes    Attends Archivist Meetings: 1 to 4 times per year    Marital Status: Married    Tobacco  Counseling Counseling given: Not Answered   Clinical Intake:  Pre-visit preparation completed: Yes  Pain : No/denies pain  Diabetes: Yes CBG done?: No Did pt. bring in CBG monitor from home?: No  How often do you need to have someone help you when you read instructions, pamphlets, or other written materials from your doctor or pharmacy?: 1 - Never  Diabetic?Yes   Nutrition Risk Assessment:  Has the patient had any N/V/D within the last 2 months?  No  Does the patient have any non-healing wounds?  No  Has the patient had any unintentional weight loss or weight gain?  No   Diabetes:  Is the patient diabetic?  Yes  If diabetic, was a CBG obtained today?  No  Did the patient bring in their glucometer from home?  No  How often do you monitor your CBG's? Daily.   Financial Strains and Diabetes Management:  Are you having any financial strains with the device, your supplies or your medication? No .  Does the patient want to be seen by Chronic Care Management for management of their diabetes?  No  Would the patient like to be referred to a Nutritionist or for Diabetic Management?  No   Diabetic Exams:  Diabetic Eye Exam: Completed 02/18/22 Diabetic Foot Exam: Completed 05/14/22   Interpreter Needed?: No  Information entered by :: Denman George LPN   Activities of Daily Living  10/20/2022    3:18 PM  In your present state of health, do you have any difficulty performing the following activities:  Hearing? 0  Vision? 0  Difficulty concentrating or making decisions? 0  Walking or climbing stairs? 0  Dressing or bathing? 0  Doing errands, shopping? 0  Preparing Food and eating ? N  Using the Toilet? N  In the past six months, have you accidently leaked urine? N  Do you have problems with loss of bowel control? N  Managing your Medications? N  Managing your Finances? N  Housekeeping or managing your Housekeeping? N    Patient Care Team: Dettinger, Fransisca Kaufmann, MD  as PCP - General (Family Medicine) Minus Breeding, MD as PCP - Cardiology (Cardiology) Druscilla Brownie, MD (Dermatology) Irine Seal, MD (Urology) Minus Breeding, MD (Cardiology) Pyrtle, Lajuan Lines, MD as Consulting Physician (Gastroenterology) Lavera Guise, [facility] as Pharmacist (Family Medicine)  Indicate any recent Medical Services you may have received from other than [facility] providers in the past year (date may be approximate).     Assessment:   This is a routine wellness examination for Jamill.  Hearing/Vision screen Hearing Screening - Comments:: Denies hearing difficulties  Vision Screening - Comments:: Wears rx glasses - up to date with routine eye exams with Dr.Le   Dietary issues and exercise activities discussed: Current Exercise Habits: The patient does not participate in regular exercise at present   Goals Addressed               This Visit's Progress     COMPLETED: Prevent Falls        Add motion sensor nightlights in pathway to bathroom.      COMPLETED: T2DM PHARMD (pt-stated)        Current Barriers:  Unable to independently afford treatment regimen Unable to achieve control of T2DM  Suboptimal therapeutic regimen for T2DM  Pharmacist Clinical Goal(s):  Over the next 90 days, patient will verbalize ability to afford treatment regimen achieve control of T2DM as evidenced by IMPROVED GLYCEMIC CONTROL through collaboration with PharmD and provider.    Interventions: 1:1 collaboration with Dettinger, Fransisca Kaufmann, MD regarding development and update of comprehensive plan of care as evidenced by provider attestation and co-signature Inter-disciplinary care team collaboration (see longitudinal plan of care) Comprehensive medication review performed; medication list updated in electronic medical record  Diabetes: Controlled (A1C 7.2%); current treatment: METFORMIN (gfr 76),   Reports Bgs are doing okay despite d/c of trulicity Trulicity was still causing some  nausea--patient has discontinued  Would consider rybelsus as needed for BG control/cardiac benefits Will apply for patient assistance if patient can tolerate Denies personal and family history of Medullary thyroid cancer (MTC) Continue metformin  Current glucose readings: fasting glucose:<130, post prandial glucose: <160 FBG this AM was 114 Denies hypoglycemic/hyperglycemic symptoms Discussed meal planning options and Plate method for healthy eating Avoid sugary drinks and desserts Incorporate balanced protein, non starchy veggies, 1 serving of carbohydrate with each meal Increase water intake Increase physical activity as able Current exercise: n/a Educated on diabetes   Patient Goals/Self-Care Activities Over the next 90 days, patient will:  - take medications as prescribed check glucose DAILY, document, and provide at future appointments collaborate with provider on medication access solutions  Follow Up Plan: Telephone follow up appointment with care management team member scheduled for: 2 months       Depression Screen    10/20/2022    3:17 PM 08/18/2022   10:14 AM 05/14/2022  10:07 AM 02/15/2022   10:05 AM 11/04/2021   10:06 AM 08/03/2021   10:07 AM 07/15/2021   10:59 AM  PHQ 2/9 Scores  PHQ - 2 Score 0 2 2 2 2 3 6   PHQ- 9 Score  5 4 6 6 7 15     Fall Risk    10/20/2022    3:14 PM 08/18/2022   10:14 AM 05/14/2022    9:59 AM 02/15/2022   10:04 AM 11/04/2021   10:06 AM  Fall Risk   Falls in the past year? 0 1 0 1 1  Number falls in past yr: 0 1  1 1   Injury with Fall? 0 0  0 0  Risk for fall due to : No Fall Risks Impaired balance/gait  Impaired balance/gait Impaired balance/gait  Follow up Falls prevention discussed;Education provided;Falls evaluation completed Falls evaluation completed  Falls evaluation completed Falls evaluation completed    FALL RISK PREVENTION PERTAINING TO THE HOME:  Any stairs in or around the home? Yes  If so, are there any without  handrails? No  Home free of loose throw rugs in walkways, pet beds, electrical cords, etc? Yes  Adequate lighting in your home to reduce risk of falls? Yes   ASSISTIVE DEVICES UTILIZED TO PREVENT FALLS:  Life alert?  No  Use of a cane, walker or w/c? No  Grab bars in the bathroom? Yes  Shower chair or bench in shower? No  Elevated toilet seat or a handicapped toilet? Yes   TIMED UP AND GO:  Was the test performed? No . Telephonic visit    Cognitive Function:    05/16/2017    2:28 PM  MMSE - Mini Mental State Exam  Orientation to time 5  Orientation to Place 5  Registration 3  Attention/ Calculation 5  Recall 2  Language- name 2 objects 2  Language- repeat 1  Language- follow 3 step command 3  Language- read & follow direction 1  Write a sentence 1  Copy design 1  Total score 29        10/20/2022    3:18 PM 07/15/2021   11:08 AM 07/14/2020   10:16 AM 07/06/2019   10:51 AM 05/30/2018   12:33 PM  6CIT Screen  What Year? 0 points 0 points 0 points 0 points 0 points  What month? 0 points 0 points 0 points 0 points 0 points  What time? 0 points 0 points 0 points 0 points 0 points  Count back from 20 0 points 0 points 0 points 0 points 0 points  Months in reverse 0 points 2 points 0 points 0 points 0 points  Repeat phrase 2 points 2 points 0 points 0 points 0 points  Total Score 2 points 4 points 0 points 0 points 0 points    Immunizations Immunization History  Administered Date(s) Administered   Covid-19, Mrna,Vaccine(Spikevax)38yrs and older 06/29/2022   Fluad Quad(high Dose 65+) 04/06/2019, 05/06/2020, 05/14/2022   H1N1 06/25/2008   Influenza, High Dose Seasonal PF 04/14/2016, 05/09/2017, 05/02/2018   Influenza,inj,Quad PF,6+ Mos 05/14/2013, 05/02/2014, 04/18/2015   Influenza-Unspecified 04/14/2009, 05/06/2010, 05/20/2011, 05/02/2012, 04/14/2016, 05/09/2017, 05/02/2018, 05/04/2021   Moderna Sars-Covid-2 Vaccination 08/30/2019, 09/28/2019, 06/04/2020    Pneumococcal Conjugate-13 06/19/2013, 05/02/2014   Pneumococcal Polysaccharide-23 01/15/2010   Td 05/20/2011   Tdap 05/20/2011, 08/03/2021   Zoster Recombinat (Shingrix) 08/03/2021, 11/04/2021   Zoster, Live 01/17/2007    TDAP status: Up to date  Flu Vaccine status: Up to date  Pneumococcal vaccine status: Up  to date  Covid-19 vaccine status: Information provided on how to obtain vaccines.   Qualifies for Shingles Vaccine? Yes   Zostavax completed Yes   Shingrix Completed?: Yes  Screening Tests Health Maintenance  Topic Date Due   HEMOGLOBIN A1C  02/16/2023   INFLUENZA VACCINE  02/17/2023   OPHTHALMOLOGY EXAM  02/19/2023   Diabetic kidney evaluation - Urine ACR  05/15/2023   FOOT EXAM  05/15/2023   Diabetic kidney evaluation - eGFR measurement  08/19/2023   Medicare Annual Wellness (AWV)  10/20/2023   DTaP/Tdap/Td (4 - Td or Tdap) 08/04/2031   Pneumonia Vaccine 22+ Years old  Completed   COVID-19 Vaccine  Completed   Hepatitis C Screening  Completed   Zoster Vaccines- Shingrix  Completed   HPV VACCINES  Aged Out   COLONOSCOPY (Pts 45-79yrs Insurance coverage will need to be confirmed)  Discontinued    Health Maintenance  There are no preventive care reminders to display for this patient.   Colorectal cancer screening: Type of screening: Colonoscopy. Completed 09/17/21. Repeat every - years  Lung Cancer Screening: (Low Dose CT Chest recommended if Age 66-80 years, 30 pack-year currently smoking OR have quit w/in 15years.) does not qualify.   Lung Cancer Screening Referral: n/a  Additional Screening:  Hepatitis C Screening: does qualify; Completed 08/06/19  Vision Screening: Recommended annual ophthalmology exams for early detection of glaucoma and other disorders of the eye. Is the patient up to date with their annual eye exam?  Yes  Who is the provider or what is the name of the office in which the patient attends annual eye exams? Dr. Marin Comment  If pt is not  established with a provider, would they like to be referred to a provider to establish care? No .   Dental Screening: Recommended annual dental exams for proper oral hygiene  Community Resource Referral / Chronic Care Management: CRR required this visit?  No   CCM required this visit?  No      Plan:     I have personally reviewed and noted the following in the patient's chart:   Medical and social history Use of alcohol, tobacco or illicit drugs  Current medications and supplements including opioid prescriptions. Patient is not currently taking opioid prescriptions. Functional ability and status Nutritional status Physical activity Advanced directives List of other physicians Hospitalizations, surgeries, and ER visits in previous 12 months Vitals Screenings to include cognitive, depression, and falls Referrals and appointments  In addition, I have reviewed and discussed with patient certain preventive protocols, quality metrics, and best practice recommendations. A written personalized care plan for preventive services as well as general preventive health recommendations were provided to patient.     Vanetta Mulders, [place]   [id number]   Due to this being a virtual visit, the after visit summary with patients personalized plan was offered to patient via mail or my-chart. per request, patient was mailed a copy of AVS.  Nurse notes:  No concerns

## 2022-10-21 ENCOUNTER — Telehealth: Payer: Self-pay

## 2022-10-21 NOTE — Telephone Encounter (Signed)
Patient cancelled May appointment. Did not want to take testosterone treatments.

## 2022-10-22 NOTE — Progress Notes (Signed)
I connected with  George Wang on 10/20/2022 by a audio enabled telemedicine application and verified that I am speaking with the correct person using two identifiers.  Patient Location: Home  Provider Location: Home Office  I discussed the limitations of evaluation and management by telemedicine. The patient expressed understanding and agreed to proceed.

## 2022-10-28 ENCOUNTER — Other Ambulatory Visit: Payer: Self-pay | Admitting: Family Medicine

## 2022-10-28 DIAGNOSIS — I10 Essential (primary) hypertension: Secondary | ICD-10-CM

## 2022-10-28 DIAGNOSIS — E782 Mixed hyperlipidemia: Secondary | ICD-10-CM

## 2022-11-01 ENCOUNTER — Other Ambulatory Visit: Payer: Self-pay | Admitting: Family Medicine

## 2022-11-01 DIAGNOSIS — E782 Mixed hyperlipidemia: Secondary | ICD-10-CM

## 2022-11-01 DIAGNOSIS — I10 Essential (primary) hypertension: Secondary | ICD-10-CM

## 2022-11-11 ENCOUNTER — Other Ambulatory Visit: Payer: Self-pay | Admitting: Family Medicine

## 2022-11-11 DIAGNOSIS — E1169 Type 2 diabetes mellitus with other specified complication: Secondary | ICD-10-CM

## 2022-11-11 DIAGNOSIS — E1165 Type 2 diabetes mellitus with hyperglycemia: Secondary | ICD-10-CM

## 2022-11-11 DIAGNOSIS — I10 Essential (primary) hypertension: Secondary | ICD-10-CM

## 2022-11-11 DIAGNOSIS — E782 Mixed hyperlipidemia: Secondary | ICD-10-CM

## 2022-11-17 ENCOUNTER — Encounter: Payer: Self-pay | Admitting: Family Medicine

## 2022-11-17 ENCOUNTER — Telehealth: Payer: Self-pay | Admitting: Family Medicine

## 2022-11-17 ENCOUNTER — Ambulatory Visit (INDEPENDENT_AMBULATORY_CARE_PROVIDER_SITE_OTHER): Payer: Medicare Other | Admitting: Family Medicine

## 2022-11-17 VITALS — BP 125/79 | HR 71 | Ht 63.0 in | Wt 179.0 lb

## 2022-11-17 DIAGNOSIS — I152 Hypertension secondary to endocrine disorders: Secondary | ICD-10-CM | POA: Diagnosis not present

## 2022-11-17 DIAGNOSIS — E1165 Type 2 diabetes mellitus with hyperglycemia: Secondary | ICD-10-CM

## 2022-11-17 DIAGNOSIS — I10 Essential (primary) hypertension: Secondary | ICD-10-CM

## 2022-11-17 DIAGNOSIS — E782 Mixed hyperlipidemia: Secondary | ICD-10-CM | POA: Diagnosis not present

## 2022-11-17 DIAGNOSIS — Z7984 Long term (current) use of oral hypoglycemic drugs: Secondary | ICD-10-CM

## 2022-11-17 DIAGNOSIS — E1159 Type 2 diabetes mellitus with other circulatory complications: Secondary | ICD-10-CM | POA: Diagnosis not present

## 2022-11-17 DIAGNOSIS — E1169 Type 2 diabetes mellitus with other specified complication: Secondary | ICD-10-CM

## 2022-11-17 LAB — BAYER DCA HB A1C WAIVED: HB A1C (BAYER DCA - WAIVED): 7 % — ABNORMAL HIGH (ref 4.8–5.6)

## 2022-11-17 MED ORDER — CHLORTHALIDONE 25 MG PO TABS
12.5000 mg | ORAL_TABLET | Freq: Every day | ORAL | 3 refills | Status: DC
Start: 2022-11-17 — End: 2023-08-31

## 2022-11-17 MED ORDER — OMEPRAZOLE 20 MG PO CPDR: 20.0000 mg | DELAYED_RELEASE_CAPSULE | Freq: Every day | ORAL | 3 refills | Status: DC

## 2022-11-17 MED ORDER — LOSARTAN POTASSIUM 100 MG PO TABS: 100.0000 mg | ORAL_TABLET | Freq: Every day | ORAL | 3 refills | Status: DC

## 2022-11-17 MED ORDER — AMLODIPINE BESYLATE 10 MG PO TABS
ORAL_TABLET | ORAL | 3 refills | Status: DC
Start: 2022-11-17 — End: 2023-08-31

## 2022-11-17 MED ORDER — METFORMIN HCL 500 MG PO TABS
1000.0000 mg | ORAL_TABLET | Freq: Two times a day (BID) | ORAL | 3 refills | Status: DC
Start: 2022-11-17 — End: 2023-08-31

## 2022-11-17 MED ORDER — ATORVASTATIN CALCIUM 80 MG PO TABS: ORAL_TABLET | ORAL | 3 refills | Status: DC

## 2022-11-17 NOTE — Progress Notes (Addendum)
BP 125/79   Pulse 71   Ht 5\' 3"  (1.6 m)   Wt 179 lb (81.2 kg)   SpO2 95%   BMI 31.71 kg/m    Subjective:   Patient ID: George Wang, male    DOB: 1945-11-30, 77 y.o.   MRN: [id number]  HPI: George Wang is a 77 y.o. male presenting on 11/17/2022 for Medical Management of Chronic Issues and Diabetes   HPI Type 2 diabetes mellitus Patient comes in today for recheck of his diabetes. Patient has been currently taking metformin. Patient is currently on an ACE inhibitor/ARB. Patient has not seen an ophthalmologist this year. Patient denies any new issues with their feet. The symptom started onset as an adult hypertension and hyperlipidemia and aortic atherosclerosis ARE RELATED TO DM   Hypertension Patient is currently on amlodipine and chlorthalidone and losartan, and their blood pressure today is 145/79. Patient denies any lightheadedness or dizziness. Patient denies headaches, blurred vision, chest pains, shortness of breath, or weakness. Denies any side effects from medication and is content with current medication.   Hyperlipidemia Patient is coming in for recheck of his hyperlipidemia. The patient is currently taking atorvastatin. They deny any issues with myalgias or history of liver damage from it. They deny any focal numbness or weakness or chest pain.   Relevant past medical, surgical, family and social history reviewed and updated as indicated. Interim medical history since our last visit reviewed. Allergies and medications reviewed and updated.  Review of Systems  Constitutional:  Negative for chills and fever.  Eyes:  Negative for visual disturbance.  Respiratory:  Negative for shortness of breath and wheezing.   Cardiovascular:  Negative for chest pain and leg swelling.  Musculoskeletal:  Negative for back pain and gait problem.  Skin:  Negative for rash.  Neurological:  Negative for dizziness, weakness and light-headedness.  All other systems reviewed and are  negative.   Per HPI unless specifically indicated above   Allergies as of 11/17/2022   No Known Allergies      Medication List        Accurate as of Nov 17, 2022 11:59 PM. If you have any questions, ask your nurse or doctor.          acetaminophen 325 MG tablet Commonly known as: TYLENOL Take 650 mg by mouth every 6 (six) hours as needed for mild pain.   amLODipine 10 MG tablet Commonly known as: NORVASC TAKE ONE (1) TABLET EACH DAY   atorvastatin 80 MG tablet Commonly known as: LIPITOR TAKE 1/2 TABLET DAILY AT 6PM   beta carotene 84132 UNIT capsule Take 25,000 Units by mouth daily.   blood glucose meter kit and supplies Dispense based on patient and insurance preference. Use up to four times daily as directed. (FOR ICD-10 E10.9, E11.9).   busPIRone 15 MG tablet Commonly known as: BUSPAR 2 qam   2  qhs   CENTRUM SILVER PO Take 1 tablet by mouth daily.   chlorthalidone 25 MG tablet Commonly known as: HYGROTON Take 0.5 tablets (12.5 mg total) by mouth daily.   fish oil-omega-3 fatty acids 1000 MG capsule Take 1 g by mouth 2 (two) times daily.   LORazepam 0.5 MG tablet Commonly known as: Ativan 2  qam  1  qhs   1  PRN   losartan 100 MG tablet Commonly known as: COZAAR Take 1 tablet (100 mg total) by mouth daily. What changed: See the new instructions. Changed by: Elige Radon Jaxiel Kines,  MD   metFORMIN 500 MG tablet Commonly known as: GLUCOPHAGE Take 2 tablets (1,000 mg total) by mouth 2 (two) times daily with a meal. What changed: additional instructions Changed by: Elige Radon Raissa Dam, MD   mirtazapine 30 MG tablet Commonly known as: Remeron 1  qhs   mirtazapine 15 MG disintegrating tablet Commonly known as: REMERON SOL-TAB Take one 15 mg tablet each morning.   omeprazole 20 MG capsule Commonly known as: PRILOSEC Take 1 capsule (20 mg total) by mouth daily.   OneTouch Ultra test strip Generic drug: glucose blood TEST BLOOD SUGARS UP TO 4 TIMES  DAILY DX E11.9   OT ULTRA/FASTTK CNTRL SOLN Soln USE WITH METER AS DIRECTED DX E11.9   Se-Tan PLUS 162-115.2-1 MG Caps Take 1 capsule by mouth 2 (two) times daily.   temazepam 15 MG capsule Commonly known as: RESTORIL 2 qhs What changed: additional instructions   Testosterone 20.25 MG/ACT (1.62%) Gel 1 pump topically to each shoulder (2.5gm) qam   venlafaxine XR 75 MG 24 hr capsule Commonly known as: Effexor XR 3  qam   Vitamin D 50 MCG (2000 UT) Caps Take 1 capsule by mouth daily.         Objective:   BP 125/79   Pulse 71   Ht 5\' 3"  (1.6 m)   Wt 179 lb (81.2 kg)   SpO2 95%   BMI 31.71 kg/m   Wt Readings from Last 3 Encounters:  11/17/22 179 lb (81.2 kg)  10/20/22 178 lb (80.7 kg)  10/19/22 178 lb 9.6 oz (81 kg)    Physical Exam Vitals and nursing note reviewed.  Constitutional:      General: He is not in acute distress.    Appearance: He is well-developed. He is not diaphoretic.  Eyes:     General: No scleral icterus.    Conjunctiva/sclera: Conjunctivae normal.  Neck:     Thyroid: No thyromegaly.  Cardiovascular:     Rate and Rhythm: Normal rate and regular rhythm.     Heart sounds: Normal heart sounds. No murmur heard. Pulmonary:     Effort: Pulmonary effort is normal. No respiratory distress.     Breath sounds: Normal breath sounds. No wheezing.  Musculoskeletal:        General: No swelling. Normal range of motion.     Cervical back: Neck supple.  Lymphadenopathy:     Cervical: No cervical adenopathy.  Skin:    General: Skin is warm and dry.     Findings: No rash.  Neurological:     Mental Status: He is alert and oriented to person, place, and time.     Coordination: Coordination normal.  Psychiatric:        Behavior: Behavior normal.       Assessment & Plan:   Problem List Items Addressed This Visit       Cardiovascular and Mediastinum   Hypertension associated with diabetes (HCC)   Relevant Medications   amLODipine (NORVASC) 10  MG tablet   atorvastatin (LIPITOR) 80 MG tablet   chlorthalidone (HYGROTON) 25 MG tablet   losartan (COZAAR) 100 MG tablet   metFORMIN (GLUCOPHAGE) 500 MG tablet   omeprazole (PRILOSEC) 20 MG capsule     Endocrine   Hyperlipidemia associated with type 2 diabetes mellitus (HCC)   Relevant Medications   amLODipine (NORVASC) 10 MG tablet   atorvastatin (LIPITOR) 80 MG tablet   chlorthalidone (HYGROTON) 25 MG tablet   losartan (COZAAR) 100 MG tablet   metFORMIN (GLUCOPHAGE) 500  MG tablet   omeprazole (PRILOSEC) 20 MG capsule   Type 2 diabetes mellitus (HCC) - Primary   Relevant Medications   atorvastatin (LIPITOR) 80 MG tablet   losartan (COZAAR) 100 MG tablet   metFORMIN (GLUCOPHAGE) 500 MG tablet   Other Relevant Orders   CBC with Differential/Platelet (Completed)   CMP14+EGFR (Completed)   Lipid panel (Completed)   Bayer DCA Hb A1c Waived (Completed)    Continue current medicine, A1c 7.0.  No changes in medicine but focus on diet.  Blood pressure and everything else looks good today so no changes there. Follow up plan: Return in about 3 months (around 02/17/2023), or if symptoms worsen or fail to improve, for Diabetes recheck.  Counseling provided for all of the vaccine components Orders Placed This Encounter  Procedures   CBC with Differential/Platelet   CMP14+EGFR   Lipid panel   Bayer DCA Hb A1c Waived    Arville Care, MD [facility] 11/22/2022, 10:34 PM

## 2022-11-18 ENCOUNTER — Other Ambulatory Visit (HOSPITAL_COMMUNITY): Payer: Self-pay

## 2022-11-18 LAB — LIPID PANEL
Chol/HDL Ratio: 3 ratio (ref 0.0–5.0)
Cholesterol, Total: 130 mg/dL (ref 100–199)
HDL: 43 mg/dL (ref >39)
LDL Chol Calc (NIH): 71 mg/dL (ref 0–99)
Triglycerides: 80 mg/dL (ref 0–149)
VLDL Cholesterol Cal: 16 mg/dL (ref 5–40)

## 2022-11-18 LAB — CMP14+EGFR
ALT: 18 IU/L (ref 0–44)
AST: 53 IU/L — ABNORMAL HIGH (ref 0–40)
Albumin/Globulin Ratio: 2.3 — ABNORMAL HIGH (ref 1.2–2.2)
Albumin: 4.5 g/dL (ref 3.8–4.8)
Alkaline Phosphatase: 79 IU/L (ref 44–121)
BUN/Creatinine Ratio: 15 (ref 10–24)
BUN: 17 mg/dL (ref 8–27)
Bilirubin Total: 0.6 mg/dL (ref 0.0–1.2)
CO2: 25 mmol/L (ref 20–29)
Calcium: 9.7 mg/dL (ref 8.6–10.2)
Chloride: 101 mmol/L (ref 96–106)
Creatinine, Ser: 1.14 mg/dL (ref 0.76–1.27)
Globulin, Total: 2 g/dL (ref 1.5–4.5)
Glucose: 156 mg/dL — ABNORMAL HIGH (ref 70–99)
Potassium: 4.5 mmol/L (ref 3.5–5.2)
Sodium: 143 mmol/L (ref 134–144)
Total Protein: 6.5 g/dL (ref 6.0–8.5)
eGFR: 66 mL/min/{1.73_m2} (ref >59)

## 2022-11-18 LAB — CBC WITH DIFFERENTIAL/PLATELET
Basophils Absolute: 0.1 10*3/uL (ref 0.0–0.2)
Basos: 1 %
EOS (ABSOLUTE): 0.3 10*3/uL (ref 0.0–0.4)
Eos: 4 %
Hematocrit: 35.7 % — ABNORMAL LOW (ref 37.5–51.0)
Hemoglobin: 12.1 g/dL — ABNORMAL LOW (ref 13.0–17.7)
Immature Grans (Abs): 0 10*3/uL (ref 0.0–0.1)
Immature Granulocytes: 0 %
Lymphocytes Absolute: 2 10*3/uL (ref 0.7–3.1)
Lymphs: 27 %
MCH: 33.6 pg — ABNORMAL HIGH (ref 26.6–33.0)
MCHC: 33.9 g/dL (ref 31.5–35.7)
MCV: 99 fL — ABNORMAL HIGH (ref 79–97)
Monocytes Absolute: 0.6 10*3/uL (ref 0.1–0.9)
Monocytes: 9 %
Neutrophils Absolute: 4.3 10*3/uL (ref 1.4–7.0)
Neutrophils: 59 %
Platelets: 253 10*3/uL (ref 150–450)
RBC: 3.6 x10E6/uL — ABNORMAL LOW (ref 4.14–5.80)
RDW: 12.5 % (ref 11.6–15.4)
WBC: 7.2 10*3/uL (ref 3.4–10.8)

## 2022-11-18 NOTE — Telephone Encounter (Signed)
Returned call to pt.  Left message requesting call back regarding PAP.  Informed patient that medication mentioned (trulicity) looks to have been discontinued at the beginning of April. Suggested patient call pcp to be sure being that med is no longer on his med list.

## 2022-11-18 NOTE — Telephone Encounter (Signed)
Yes I talked to them in the visit about restarting his Trulicity which she had some leftover or starting Ozempic, whenever he can get prescription assistance for.  I do want him on either Trulicity 0.75 or Ozempic 0.5, did not send to pharmacy because I figured he needed a visit to get it covered in the first place

## 2022-11-18 NOTE — Telephone Encounter (Signed)
You can email it!

## 2022-11-26 ENCOUNTER — Other Ambulatory Visit: Payer: Self-pay | Admitting: Pharmacist

## 2022-11-26 NOTE — Progress Notes (Signed)
  Pharmacy Note   11/26/2022 Name:  KAM MITCHELTREE         MRN:  [id number]      DOB:  07/31/1945   Summary: T2DM   Recommendations/Changes made from today's visit: Diabetes: Controlled (A1C 7.0%); current treatment: METFORMIN (GFR 66),   PCP requesting restart of GLP1--can attempt to get Trulicity Trulicity caused nausea in the past--MD wants to restart Denies personal and family history of Medullary thyroid cancer (MTC) Continue metformin  Current glucose readings: fasting glucose:<130, post prandial glucose: <160 Denies hypoglycemic/hyperglycemic symptoms Discussed meal planning options and Plate method for healthy eating Avoid sugary drinks and desserts Incorporate balanced protein, non starchy veggies, 1 serving of carbohydrate with each meal Increase water intake Increase physical activity as able Current exercise: n/a Educated on diabetes Follow Up Plan: Telephone follow up appointment with care management team member scheduled for: 2 months   Kieth Brightly, PharmD, BCACP Clinical Pharmacist, [facility]

## 2022-11-30 ENCOUNTER — Ambulatory Visit (HOSPITAL_BASED_OUTPATIENT_CLINIC_OR_DEPARTMENT_OTHER): Payer: Medicare Other | Admitting: Psychiatry

## 2022-11-30 ENCOUNTER — Encounter (HOSPITAL_COMMUNITY): Payer: Self-pay | Admitting: Psychiatry

## 2022-11-30 VITALS — BP 129/74 | HR 80 | Ht 63.0 in | Wt 180.0 lb

## 2022-11-30 DIAGNOSIS — F324 Major depressive disorder, single episode, in partial remission: Secondary | ICD-10-CM | POA: Diagnosis not present

## 2022-11-30 MED ORDER — MIRTAZAPINE 30 MG PO TABS: ORAL_TABLET | ORAL | 5 refills | Status: DC

## 2022-11-30 MED ORDER — MIRTAZAPINE 15 MG PO TBDP: ORAL_TABLET | ORAL | 5 refills | Status: DC

## 2022-11-30 MED ORDER — LORAZEPAM 0.5 MG PO TABS: ORAL_TABLET | ORAL | 4 refills | Status: DC

## 2022-11-30 MED ORDER — BUSPIRONE HCL 15 MG PO TABS: ORAL_TABLET | ORAL | 5 refills | Status: DC

## 2022-11-30 MED ORDER — VENLAFAXINE HCL ER 75 MG PO CP24: ORAL_CAPSULE | ORAL | 5 refills | Status: DC

## 2022-11-30 NOTE — Progress Notes (Signed)
Psychiatric Initial Adult Assessment   Patient Identification: George Wang MRN:  [id number] Date of Evaluation:  11/30/2022 Referral Source: Community/emergency room Chief Complaint:   Visit Diagnosis: Major depression  History of Present Illness:        Today the patient is baseline.  Close evaluation of his apparent the patient starts to feel uncomfortable and describes it as being depressed in the morning.  Once he takes his Ativan dose and has a cup of coffee reads the paper and feels much better.  By the late morning he feels back to himself particularly if he is going to be going to work.  Occasionally he will feel more anxious at the very end of the day before he goes to bed but it is not all that time.  The patient is eating well.  His wife is still a stressor.  She has problems with her back and her hip.  She also has 1 kidney.  She is not here today with him.  Patient continues to work.  He works with his son.  Patient will have to reduce his Ativan if he is going to pass and urinalysis test.  He really wants to continue to work.  The patient denies problems with sleep or appetite. Depression Symptoms:  depressed mood, (Hypo) Manic Symptoms:   Anxiety Symptoms:  Excessive Worry, Psychotic Symptoms:   PTSD Symptoms:   Past Psychiatric History: 1 psychiatric hospitalization in 1989 presently on Lexapro 10 mg, BuSpar 30 mg  Previous Psychotropic Medications: Yes   Substance Abuse History in the last 12 months:  No.  Consequences of Substance Abuse:   Past Medical History:  Past Medical History:  Diagnosis Date   Allergy    mild   Anemia    Iron deficiency   Anxiety state, unspecified    Aortic atherosclerosis (HCC)    Arthritis    Calculus of kidney    kidney stones  - normal BMP    Cancer (HCC)    Prostate, skin cancer x 2    Cataract    forming both eyes   Depressive disorder, not elsewhere classified    Diverticulosis of colon (without mention of  hemorrhage)    GERD (gastroesophageal reflux disease)    on nexium   Gout, unspecified    Heart murmur    Hyperlipidemia    Hypertension, essential    Irritable bowel syndrome    Neuromuscular disorder (HCC)    numbness in knees from back issues    Personal history of malignant neoplasm of prostate    PVC (premature ventricular contraction)    Type II or unspecified type diabetes mellitus without mention of complication, not stated as uncontrolled     Past Surgical History:  Procedure Laterality Date   COLONOSCOPY     HERNIA REPAIR     left side   POLYPECTOMY     PROSTATE SURGERY     skin cancer removed     chest x 2 3-4 yrs ago    TONSILLECTOMY      Family Psychiatric History:   Family History:  Family History  Problem Relation Age of Onset   Cancer Maternal Aunt        breast   Pancreatic cancer Paternal Aunt    Prostate cancer Paternal Uncle    Heart disease Maternal Grandmother 16   Heart disease Maternal Grandfather        fluid / CHF    Heart disease Paternal Grandfather 65   Heart  attack Paternal Grandfather        passed at 81   Prostate cancer Father    Coronary artery disease Father 64       CABG   Hypertension Mother    Hyperlipidemia Mother    Other Daughter        accident with tornado 12/12/96   Hypertension Son    Colon cancer Neg Hx    Colon polyps Neg Hx    Esophageal cancer Neg Hx    Rectal cancer Neg Hx    Stomach cancer Neg Hx     Social History:   Social History   Socioeconomic History   Marital status: Married    Spouse name: Harriett Sine   Number of children: 2   Years of education: 13   Highest education level: Some college, no degree  Occupational History   Occupation: Charity fundraiser    Comment: Part time  Tobacco Use   Smoking status: Former    Packs/day: 1.00    Years: 19.00    Additional pack years: 0.00    Total pack years: 19.00    Types: Cigarettes    Quit date: 10/13/1978    Years since quitting: 44.1    Smokeless tobacco: Former    Types: Snuff    Quit date: 05/30/2008  Vaping Use   Vaping Use: Never used  Substance and Sexual Activity   Alcohol use: Not Currently   Drug use: No   Sexual activity: Yes  Other Topics Concern   Not on file  Social History Narrative   Lives at home with wife.  He has one son and a daughter that died in a tornado in 9. 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 He has one living grandson and one grandson that passed away in 2016/12/12 at age 35 from cancer.   Social Determinants of Health   Financial Resource Strain: Low Risk  (10/20/2022)   Overall Financial Resource Strain (CARDIA)    Difficulty of Paying Living Expenses: Not hard at all  Food Insecurity: No Food Insecurity (10/20/2022)   Hunger Vital Sign    Worried About Running Out of Food in the Last Year: Never true    Ran Out of Food in the Last Year: Never true  Transportation Needs: No Transportation Needs (10/20/2022)   PRAPARE - Administrator, Civil Service (Medical): No    Lack of Transportation (Non-Medical): No  Physical Activity: Inactive (10/20/2022)   Exercise Vital Sign    Days of Exercise per Week: 0 days    Minutes of Exercise per Session: 0 min  Stress: No Stress Concern Present (10/20/2022)   [employer] of Occupational Health - Occupational Stress Questionnaire    Feeling of Stress : Not at all  Social Connections: Socially Integrated (10/20/2022)   Social Connection and Isolation Panel [NHANES]    Frequency of Communication with Friends and Family: More than three times a week    Frequency of Social Gatherings with Friends and Family: Once a week    Attends Religious Services: More than 4 times per year    Active Member of [employer] or Organizations: Yes    Attends Banker Meetings: 1 to 4 times per year    Marital Status: Married    Additional Social History:   Allergies:  No Known Allergies  Metabolic Disorder Labs: Lab Results  Component  Value Date   HGBA1C 7.0 (H) 11/17/2022   No results found for: "PROLACTIN"  Lab Results  Component Value Date   CHOL 130 11/17/2022   TRIG 80 11/17/2022   HDL 43 11/17/2022   CHOLHDL 3.0 11/17/2022   LDLCALC 71 11/17/2022   LDLCALC 49 08/18/2022   Lab Results  Component Value Date   TSH 1.330 12/05/2018    Therapeutic Level Labs: No results found for: "LITHIUM" No results found for: "CBMZ" No results found for: "VALPROATE"  Current Medications: Current Outpatient Medications  Medication Sig Dispense Refill   acetaminophen (TYLENOL) 325 MG tablet Take 650 mg by mouth every 6 (six) hours as needed for mild pain.     amLODipine (NORVASC) 10 MG tablet TAKE ONE (1) TABLET EACH DAY 90 tablet 3   atorvastatin (LIPITOR) 80 MG tablet TAKE 1/2 TABLET DAILY AT 6PM 45 tablet 3   beta carotene 09604 UNIT capsule Take 25,000 Units by mouth daily.     Blood Glucose Calibration (OT ULTRA/FASTTK CNTRL SOLN) SOLN USE WITH METER AS DIRECTED DX E11.9 1 each 1   blood glucose meter kit and supplies Dispense based on patient and insurance preference. Use up to four times daily as directed. (FOR ICD-10 E10.9, E11.9). 1 each 1   chlorthalidone (HYGROTON) 25 MG tablet Take 0.5 tablets (12.5 mg total) by mouth daily. 45 tablet 3   Cholecalciferol (VITAMIN D) 2000 UNITS CAPS Take 1 capsule by mouth daily.     FeFum-FePo-FA-B Cmp-C-Zn-Mn-Cu (SE-TAN PLUS) 162-115.2-1 MG CAPS Take 1 capsule by mouth 2 (two) times daily. 180 capsule 3   fish oil-omega-3 fatty acids 1000 MG capsule Take 1 g by mouth 2 (two) times daily.      glucose blood (ONETOUCH ULTRA) test strip TEST BLOOD SUGARS UP TO 4 TIMES DAILY DX E11.9 400 strip 3   losartan (COZAAR) 100 MG tablet Take 1 tablet (100 mg total) by mouth daily. 90 tablet 3   metFORMIN (GLUCOPHAGE) 500 MG tablet Take 2 tablets (1,000 mg total) by mouth 2 (two) times daily with a meal. 360 tablet 3   Multiple Vitamins-Minerals (CENTRUM SILVER PO) Take 1 tablet by mouth  daily.      omeprazole (PRILOSEC) 20 MG capsule Take 1 capsule (20 mg total) by mouth daily. 90 capsule 3   Testosterone 20.25 MG/ACT (1.62%) GEL 1 pump topically to each shoulder (2.5gm) qam 75 g 5   busPIRone (BUSPAR) 15 MG tablet 2 qam   2  qhs 120 tablet 5   LORazepam (ATIVAN) 0.5 MG tablet 2  qam  1  qhs   1  PRN 120 tablet 4   mirtazapine (REMERON SOL-TAB) 15 MG disintegrating tablet Take one 15 mg tablet each morning. 30 tablet 5   mirtazapine (REMERON) 30 MG tablet 1  qhs 30 tablet 5   venlafaxine XR (EFFEXOR XR) 75 MG 24 hr capsule 3  qam 90 capsule 5   No current facility-administered medications for this visit.    Musculoskeletal: Strength & Muscle Tone: within normal limits Gait & Station: normal Patient leans: N/A  Psychiatric Specialty Exam: Review of Systems  Blood pressure 129/74, pulse 80, height 5\' 3"  (1.6 m), weight 180 lb (81.6 kg).Body mass index is 31.89 kg/m.  General Appearance: Casual  Eye Contact:  Good  Speech:  Clear and Coherent  Volume:  Normal  Mood:  Depressed  Affect:  Appropriate  Thought Process:  Coherent  Orientation:  Full (Time, Place, and Person)  Thought Content:  WDL  Suicidal Thoughts:  No  Homicidal Thoughts:  No  Memory:  Negative  Judgement:  Good  Insight:  Good  Psychomotor Activity:  Normal  Concentration:    Recall:  Good  Fund of Knowledge:Fair  Language: Good  Akathisia:  No  Handed:  Right  AIMS (if indicated):  not done  Assets:  Desire for Improvement  ADL's:  Intact  Cognition: WNL  Sleep:  Good   Screenings: GAD-7    Flowsheet Row Office Visit from 11/17/2022 in [facility] Office Visit from 08/18/2022 in [facility] Office Visit from 05/14/2022 in [facility] Office Visit from 02/15/2022 in [facility] Office Visit from 11/04/2021 in [facility]  Total GAD-7 Score 7 5 5 6 7       Mini-Mental    Flowsheet Row Clinical Support from 05/16/2017 in [facility]  Total Score (max 30 points ) 29      PHQ2-9    Flowsheet Row Office Visit from 11/17/2022 in [facility] Clinical Support from 10/20/2022 in [facility] Office Visit from 08/18/2022 in [facility] Office Visit from 05[facility]14/2022 in [facility] Office Visit from 02/15/2022 in [facility]  PHQ-2 Total Score 4 0 2 2 2   PHQ-9 Total Score 9 -- 5 4 6        Assessment and Plan:  5/14/20242:23 PM    This patient's first problem is major depression.  I think it is well controlled with Effexor 75 mg 3 a day and Remeron 15 mg in the morning and 30 mg at night.  Overall he says his mood is fairly stable his biggest problem seems to be anxiety.  His second problem therefore will be defined as an adjustment disorder with an anxious mood state and will continue taking BuSpar 15 mg 2 in the morning and 2 at night which we have to find is the maximum dose.  We will make a slight adjustment in how he takes his lorazepam.  He was taking Ativan 0.5 mg 2 in the morning 1 late in the afternoon and 1 as needed.  At this time we will ask him to change it to taking 1 every night.  I think he may be experiencing some dysphoria from withdrawal from benzodiazepines.  Therefore noted is that he no longer takes Restoril for sleep.  Therefore he will take Ativan 0.5 mg in the morning when he wakes 0.5 mg in the late afternoon 0.5 mg when he goes to bed at night and another 0.5 if he needs it.  Very often he says he only needs 2 or 3 of the Ativan.  Therefore were trying to spread out so that when he wakes up in the morning there is a slight dip in his benzodiazepine blood level.  He will return to see [place]  in 4 to 5 months hopefully with his wife we will see if the adjustment of Ativan is having effect.  Overall though he is very stable and functions well.

## 2022-12-03 ENCOUNTER — Telehealth: Payer: Self-pay

## 2022-12-03 NOTE — Progress Notes (Signed)
  Care Coordination Note  12/03/2022 Name: George Wang MRN: [id number] DOB: 07-Jul-1946  [name] is a 77 y.o. year old male who is a primary care patient of Dettinger, Elige Radon, MD and is actively engaged with the Chronic Care Management team. I reached out to [name] by phone today to assist with scheduling a follow up visit with the Pharmacist  Follow up plan: Unsuccessful telephone outreach attempt made. A HIPAA compliant phone message was left for the patient providing contact information and requesting a return call.  The care management team will reach out to the patient again over the next 7 days.  If patient returns call to provider office, please advise to call CCM Care Guide Khali Perella  at [phone]  Penne Lash, RMA Care Guide [facility]  [place], [place] [address] Direct Dial: [phone] [email or web address]

## 2022-12-09 NOTE — Progress Notes (Signed)
  Care Coordination Note  12/09/2022 Name: [name] MRN: [id number] DOB: 1945-09-30  [name] is a 77 y.o. year old male who is a primary care patient of Dettinger, Elige Radon, MD and is actively engaged with the Chronic Care Management team. I reached out to [name] by phone today to assist with re-scheduling a follow up visit with the Pharmacist  Follow up plan: Unsuccessful telephone outreach attempt made. A HIPAA compliant phone message was left for the patient providing contact information and requesting a return call.  The care management team will reach out to the patient again over the next 7 days.  If patient returns call to provider office, please advise to call CCM Care Guide Penne Lash  at [phone]  Penne Lash, RMA Care Guide [facility]  [place], [place] [address] Direct Dial: [phone] [email or web address]

## 2022-12-10 NOTE — Progress Notes (Signed)
   Care Guide Note  12/10/2022 Name: George Wang MRN: [id number] DOB: 10/03/1945  Referred by: Dettinger, Elige Radon, MD Reason for referral : Care Coordination (Request to schedule with Pharm d )   George Wang is a 77 y.o. year old male who is a primary care patient of Dettinger, Elige Radon, MD. [name] was referred to the pharmacist for assistance related to DM.    Successful contact was made with the patient to discuss pharmacy services including being ready for the pharmacist to call at least 5 minutes before the scheduled appointment time, to have medication bottles and any blood sugar or blood pressure readings ready for review. The patient agreed to meet with the pharmacist via with the pharmacist via telephone visit on (date/time).  01/14/2023  Penne Lash, RMA Care Guide [facility]  [place], [place] [address] Direct Dial: [phone] [email or web address]

## 2022-12-30 ENCOUNTER — Ambulatory Visit: Payer: Medicare Other | Admitting: Urology

## 2023-01-10 ENCOUNTER — Other Ambulatory Visit: Payer: Self-pay | Admitting: Family Medicine

## 2023-01-12 ENCOUNTER — Other Ambulatory Visit: Payer: Self-pay | Admitting: Pharmacist

## 2023-01-12 DIAGNOSIS — E1165 Type 2 diabetes mellitus with hyperglycemia: Secondary | ICD-10-CM

## 2023-01-12 MED ORDER — TRULICITY 1.5 MG/0.5ML ~~LOC~~ SOAJ
1.5000 mg | SUBCUTANEOUS | 5 refills | Status: DC
Start: 2023-01-12 — End: 2023-05-30

## 2023-01-12 NOTE — Progress Notes (Signed)
  Pharmacy Note   01/12/2023 Name:  George Wang         MRN:  [id number]      DOB:  12/24/1945   Summary: T2DM   Recommendations/Changes made from today's visit: Diabetes: Controlled (A1C 7.0%); current treatment: METFORMIN (GFR 66),   PCP requesting restart of GLP1--can attempt to get Trulicity Trulicity caused nausea in the past--MD wants to restart Denies personal and family history of Medullary thyroid cancer (MTC) ESCRIBED TRULICITY TO [facility] ORDER CHART routed to [facility]Ph[facility] for [employer] medication assistance (pt previously enrolled in the past) Continue metformin  Current glucose readings: fasting glucose:<130, post prandial glucose: <160 Denies hypoglycemic/hyperglycemic symptoms Discussed meal planning options and Plate method for healthy eating Avoid sugary drinks and desserts Incorporate balanced protein, non starchy veggies, 1 serving of carbohydrate with each meal Increase water intake Increase physical activity as able Current exercise: n/a Educated on diabetes  Follow Up Plan: Telephone follow up appointment with care management team member scheduled for: 2 months     Kieth Brightly, PharmD, [employer]

## 2023-01-14 ENCOUNTER — Ambulatory Visit (INDEPENDENT_AMBULATORY_CARE_PROVIDER_SITE_OTHER): Payer: Medicare Other | Admitting: Pharmacist

## 2023-01-14 DIAGNOSIS — E1165 Type 2 diabetes mellitus with hyperglycemia: Secondary | ICD-10-CM

## 2023-01-14 NOTE — Progress Notes (Signed)
Error--patient no answer x2

## 2023-01-19 NOTE — Telephone Encounter (Signed)
Submitted RE-ENROLLMENT application for TRULICITY 1.5MG/0.5ML to [employer] for patient assistance.   Phone: [phone]  

## 2023-01-21 NOTE — Telephone Encounter (Signed)
Received notification from [facility] [employer] regarding patient assistance DENIAL for TRULICITY 1.5MG /0.5ML.   Looks like patient may not have been previously enrolled? Per [employer]: the medication requested is not available thru [employer].  Phone:[phone]

## 2023-02-01 ENCOUNTER — Telehealth: Payer: Self-pay | Admitting: Pharmacist

## 2023-02-01 NOTE — Telephone Encounter (Signed)
    02/01/2023 Name: George Wang MRN: [id number] DOB: 1946-02-18   PCP has referred 60 yoM to Pharmacy for diabetes management.  We attempted to restart GLP1 (Trulicity) via [employer] patient assistance, however it was denied as no new Trulciity PAPs are being accepted.  We can attempt Ozempic patient assistance via Novo Nordisk PAP, however 3 unsuccessful outreach attempts have been made.  Voicemails were left instructing patient to return call to PCP office.  I will follow up when return call is made  Insurance coverage/medication affordability: Medicare; brand names are expensive  Current diabetes medications include:  Attempting to get GLP1 (cost is an issue) Denies personal and family history of Medullary thyroid cancer (MTC) Current hypertension medications include: amlodipine, chlorthalidone, losartan Goal 130/80 Current hyperlipidemia medications include: atorvastatin LDL 71 on 11/2022 STATIN ALLERGY/INTOLERANCE DOCUMENTED IN EMR n/a Lipid Panel     Component Value Date/Time   CHOL 130 11/17/2022 1136   CHOL 103 11/15/2012 1108   TRIG 80 11/17/2022 1136   TRIG 70 12/30/2016 1151   TRIG 50 11/15/2012 1108   HDL 43 11/17/2022 1136   HDL 34 (L) 12/30/2016 1151   HDL 37 (L) 11/15/2012 1108   CHOLHDL 3.0 11/17/2022 1136   LDLCALC 71 11/17/2022 1136   LDLCALC 61 01/29/2014 1135   LDLCALC 56 11/15/2012 1108   LABVLDL 16 11/17/2022 1136    Lab Results  Component Value Date   HGBA1C 7.0 (H) 11/17/2022    Kieth Brightly, PharmD, BCACP Clinical Pharmacist, [facility]

## 2023-02-21 ENCOUNTER — Ambulatory Visit: Payer: Medicare Other | Admitting: Family Medicine

## 2023-02-21 ENCOUNTER — Encounter: Payer: Self-pay | Admitting: Family Medicine

## 2023-02-21 VITALS — BP 140/77 | HR 80 | Ht 63.0 in | Wt 177.0 lb

## 2023-02-21 DIAGNOSIS — Z7985 Long-term (current) use of injectable non-insulin antidiabetic drugs: Secondary | ICD-10-CM | POA: Diagnosis not present

## 2023-02-21 DIAGNOSIS — E1165 Type 2 diabetes mellitus with hyperglycemia: Secondary | ICD-10-CM | POA: Diagnosis not present

## 2023-02-21 DIAGNOSIS — Z7984 Long term (current) use of oral hypoglycemic drugs: Secondary | ICD-10-CM | POA: Diagnosis not present

## 2023-02-21 DIAGNOSIS — E785 Hyperlipidemia, unspecified: Secondary | ICD-10-CM | POA: Diagnosis not present

## 2023-02-21 DIAGNOSIS — I152 Hypertension secondary to endocrine disorders: Secondary | ICD-10-CM | POA: Diagnosis not present

## 2023-02-21 DIAGNOSIS — E1169 Type 2 diabetes mellitus with other specified complication: Secondary | ICD-10-CM | POA: Diagnosis not present

## 2023-02-21 DIAGNOSIS — E1159 Type 2 diabetes mellitus with other circulatory complications: Secondary | ICD-10-CM | POA: Diagnosis not present

## 2023-02-21 DIAGNOSIS — C44612 Basal cell carcinoma of skin of right upper limb, including shoulder: Secondary | ICD-10-CM

## 2023-02-21 LAB — BAYER DCA HB A1C WAIVED: HB A1C (BAYER DCA - WAIVED): 7 % — ABNORMAL HIGH (ref 4.8–5.6)

## 2023-02-21 LAB — CMP14+EGFR
ALT: 17 IU/L (ref 0–44)
AST: 51 IU/L — ABNORMAL HIGH (ref 0–40)
Albumin: 4.3 g/dL (ref 3.8–4.8)
Alkaline Phosphatase: 75 IU/L (ref 44–121)
BUN/Creatinine Ratio: 16 (ref 10–24)
BUN: 17 mg/dL (ref 8–27)
Bilirubin Total: 0.5 mg/dL (ref 0.0–1.2)
CO2: 26 mmol/L (ref 20–29)
Calcium: 9.9 mg/dL (ref 8.6–10.2)
Chloride: 101 mmol/L (ref 96–106)
Creatinine, Ser: 1.05 mg/dL (ref 0.76–1.27)
Globulin, Total: 2.1 g/dL (ref 1.5–4.5)
Glucose: 139 mg/dL — ABNORMAL HIGH (ref 70–99)
Potassium: 4.6 mmol/L (ref 3.5–5.2)
Sodium: 140 mmol/L (ref 134–144)
Total Protein: 6.4 g/dL (ref 6.0–8.5)
eGFR: 72 mL/min/{1.73_m2} (ref >59)

## 2023-02-21 LAB — CBC WITH DIFFERENTIAL/PLATELET
Basophils Absolute: 0.1 10*3/uL (ref 0.0–0.2)
Basos: 2 %
EOS (ABSOLUTE): 0.2 10*3/uL (ref 0.0–0.4)
Eos: 3 %
Hematocrit: 35.6 % — ABNORMAL LOW (ref 37.5–51.0)
Hemoglobin: 11.9 g/dL — ABNORMAL LOW (ref 13.0–17.7)
Immature Grans (Abs): 0 10*3/uL (ref 0.0–0.1)
Immature Granulocytes: 0 %
Lymphocytes Absolute: 1.4 10*3/uL (ref 0.7–3.1)
Lymphs: 23 %
MCH: 33.8 pg — ABNORMAL HIGH (ref 26.6–33.0)
MCHC: 33.4 g/dL (ref 31.5–35.7)
MCV: 101 fL — ABNORMAL HIGH (ref 79–97)
Monocytes Absolute: 0.6 10*3/uL (ref 0.1–0.9)
Monocytes: 10 %
Neutrophils Absolute: 3.7 10*3/uL (ref 1.4–7.0)
Neutrophils: 62 %
Platelets: 257 10*3/uL (ref 150–450)
RBC: 3.52 x10E6/uL — ABNORMAL LOW (ref 4.14–5.80)
RDW: 12.3 % (ref 11.6–15.4)
WBC: 6 10*3/uL (ref 3.4–10.8)

## 2023-02-21 LAB — LIPID PANEL
Chol/HDL Ratio: 2.3 ratio (ref 0.0–5.0)
Cholesterol, Total: 103 mg/dL (ref 100–199)
HDL: 44 mg/dL (ref >39)
LDL Chol Calc (NIH): 45 mg/dL (ref 0–99)
Triglycerides: 66 mg/dL (ref 0–149)
VLDL Cholesterol Cal: 14 mg/dL (ref 5–40)

## 2023-02-21 NOTE — Progress Notes (Signed)
BP (!) 140/77   Pulse 80   Ht 5\' 3"  (1.6 m)   Wt 177 lb (80.3 kg)   SpO2 97%   BMI 31.35 kg/m    Subjective:   Patient ID: George Wang, male    DOB: 1946/04/22, 77 y.o.   MRN: [id number]  HPI: George Wang is a 77 y.o. male presenting on 02/21/2023 for Medical Management of Chronic Issues and Diabetes   HPI Type 2 diabetes mellitus Patient comes in today for recheck of his diabetes. Patient has been currently taking metformin and Trulicity although he is not taking the Trulicity consistently because he had some stomach issues last time he took it.. Patient is currently on an ACE inhibitor/ARB. Patient has not seen an ophthalmologist this year. Patient denies any new issues with their feet. The symptom started onset as an adult hypertension and hyperlipidemia and atherosclerosis ARE RELATED TO DM   Hypertension Patient is currently on amlodipine and chlorthalidone and losartan, and their blood pressure today is 140/77. Patient denies any lightheadedness or dizziness. Patient denies headaches, blurred vision, chest pains, shortness of breath, or weakness. Denies any side effects from medication and is content with current medication.   Hyperlipidemia Patient is coming in for recheck of his hyperlipidemia. The patient is currently taking atorvastatin.jadvirtual . They deny any issues with myalgias or history of liver damage from it. They deny any focal numbness or weakness or chest pain.   Patient has a skin lesion on his forearm has been coming up and getting bigger, it is on his right forearm just above his wrist on the posterior aspect and he says it feels rough like leather.  Relevant past medical, surgical, family and social history reviewed and updated as indicated. Interim medical history since our last visit reviewed. Allergies and medications reviewed and updated.  Review of Systems  Constitutional:  Negative for chills and fever.  Eyes:  Negative for discharge.   Respiratory:  Negative for shortness of breath and wheezing.   Cardiovascular:  Negative for chest pain and leg swelling.  Musculoskeletal:  Negative for back pain and gait problem.  Skin:  Negative for rash and wound.  All other systems reviewed and are negative.   Per HPI unless specifically indicated above   Allergies as of 02/21/2023   No Known Allergies      Medication List        Accurate as of February 21, 2023 10:35 AM. If you have any questions, ask your nurse or doctor.          acetaminophen 325 MG tablet Commonly known as: TYLENOL Take 650 mg by mouth every 6 (six) hours as needed for mild pain.   amLODipine 10 MG tablet Commonly known as: NORVASC TAKE ONE (1) TABLET EACH DAY   atorvastatin 80 MG tablet Commonly known as: LIPITOR TAKE 1/2 TABLET DAILY AT 6PM   beta carotene 40981 UNIT capsule Take 25,000 Units by mouth daily.   blood glucose meter kit and supplies Dispense based on patient and insurance preference. Use up to four times daily as directed. (FOR ICD-10 E10.9, E11.9).   busPIRone 15 MG tablet Commonly known as: BUSPAR 2 qam   2  qhs   CENTRUM SILVER PO Take 1 tablet by mouth daily.   chlorthalidone 25 MG tablet Commonly known as: HYGROTON Take 0.5 tablets (12.5 mg total) by mouth daily.   fish oil-omega-3 fatty acids 1000 MG capsule Take 1 g by mouth 2 (two) times daily.  LORazepam 0.5 MG tablet Commonly known as: Ativan 2  qam  1  qhs   1  PRN   losartan 100 MG tablet Commonly known as: COZAAR Take 1 tablet (100 mg total) by mouth daily.   metFORMIN 500 MG tablet Commonly known as: GLUCOPHAGE Take 2 tablets (1,000 mg total) by mouth 2 (two) times daily with a meal.   mirtazapine 30 MG tablet Commonly known as: Remeron 1  qhs   mirtazapine 15 MG disintegrating tablet Commonly known as: REMERON SOL-TAB Take one 15 mg tablet each morning.   omeprazole 20 MG capsule Commonly known as: PRILOSEC Take 1 capsule (20 mg  total) by mouth daily.   OneTouch Ultra test strip Generic drug: glucose blood TEST BLOOD SUGARS UP TO 4 TIMES DAILY DX E11.9   OT ULTRA/FASTTK CNTRL SOLN Soln USE WITH METER AS DIRECTED DX E11.9   Se-Tan PLUS 162-115.2-1 MG Caps TAKE ONE (1) CAPSULE BY MOUTH 2 TIMES DAILY   Testosterone 20.25 MG/ACT (1.62%) Gel 1 pump topically to each shoulder (2.5gm) qam   Trulicity 1.5 MG/0.5ML Sopn Generic drug: Dulaglutide Inject 1.5 mg into the skin once a week.   venlafaxine XR 75 MG 24 hr capsule Commonly known as: Effexor XR 3  qam   Vitamin D 50 MCG (2000 UT) Caps Take 1 capsule by mouth daily.         Objective:   BP (!) 140/77   Pulse 80   Ht 5\' 3"  (1.6 m)   Wt 177 lb (80.3 kg)   SpO2 97%   BMI 31.35 kg/m   Wt Readings from Last 3 Encounters:  02/21/23 177 lb (80.3 kg)  11/17/22 179 lb (81.2 kg)  10/20/22 178 lb (80.7 kg)    Physical Exam Vitals and nursing note reviewed.  Constitutional:      General: He is not in acute distress.    Appearance: He is well-developed. He is not diaphoretic.  Eyes:     General: No scleral icterus.    Conjunctiva/sclera: Conjunctivae normal.  Neck:     Thyroid: No thyromegaly.  Cardiovascular:     Rate and Rhythm: Normal rate and regular rhythm.     Heart sounds: Normal heart sounds. No murmur heard. Pulmonary:     Effort: Pulmonary effort is normal. No respiratory distress.     Breath sounds: Normal breath sounds. No wheezing.  Musculoskeletal:        General: Normal range of motion.     Cervical back: Neck supple.  Lymphadenopathy:     Cervical: No cervical adenopathy.  Skin:    General: Skin is warm and dry.     Findings: Lesion (Raised 0.2 cm lesion on right forearm consistent with basal cell) present. No rash.  Neurological:     Mental Status: He is alert and oriented to person, place, and time.     Coordination: Coordination normal.  Psychiatric:        Behavior: Behavior normal.       Assessment & Plan:    Problem List Items Addressed This Visit       Cardiovascular and Mediastinum   Hypertension associated with diabetes (HCC)   Relevant Orders   CBC with Differential/Platelet   CMP14+EGFR   Lipid panel   Bayer DCA Hb A1c Waived     Endocrine   Hyperlipidemia associated with type 2 diabetes mellitus (HCC)   Type 2 diabetes mellitus (HCC) - Primary   Relevant Orders   CBC with Differential/Platelet  CMP14+EGFR   Lipid panel   Bayer DCA Hb A1c Waived   Other Visit Diagnoses     Basal cell carcinoma (BCC) of skin of right upper extremity including shoulder           A1c 7.0.  Recommended to go to dermatologist for what looks like to be a basal cell skin cancer lesion on his right forearm.  No change in medication from RN. Follow up plan: Return in about 3 months (around 05/24/2023), or if symptoms worsen or fail to improve, for Type 2 diabetes and hypertension and cholesterol.  Counseling provided for all of the vaccine components Orders Placed This Encounter  Procedures   CBC with Differential/Platelet   CMP14+EGFR   Lipid panel   Bayer DCA Hb A1c Waived    Arville Care, MD [facility] 02/21/2023, 10:35 AM

## 2023-03-28 DIAGNOSIS — C44612 Basal cell carcinoma of skin of right upper limb, including shoulder: Secondary | ICD-10-CM | POA: Diagnosis not present

## 2023-03-28 DIAGNOSIS — D485 Neoplasm of uncertain behavior of skin: Secondary | ICD-10-CM | POA: Diagnosis not present

## 2023-04-16 ENCOUNTER — Other Ambulatory Visit: Payer: Self-pay | Admitting: Family Medicine

## 2023-04-27 ENCOUNTER — Other Ambulatory Visit: Payer: Self-pay

## 2023-04-27 DIAGNOSIS — E119 Type 2 diabetes mellitus without complications: Secondary | ICD-10-CM | POA: Diagnosis not present

## 2023-04-27 DIAGNOSIS — H35363 Drusen (degenerative) of macula, bilateral: Secondary | ICD-10-CM | POA: Diagnosis not present

## 2023-04-27 DIAGNOSIS — E1165 Type 2 diabetes mellitus with hyperglycemia: Secondary | ICD-10-CM

## 2023-04-27 DIAGNOSIS — H25813 Combined forms of age-related cataract, bilateral: Secondary | ICD-10-CM | POA: Diagnosis not present

## 2023-04-27 LAB — HM DIABETES EYE EXAM

## 2023-05-02 DIAGNOSIS — L570 Actinic keratosis: Secondary | ICD-10-CM | POA: Diagnosis not present

## 2023-05-02 DIAGNOSIS — L820 Inflamed seborrheic keratosis: Secondary | ICD-10-CM | POA: Diagnosis not present

## 2023-05-02 DIAGNOSIS — Z85828 Personal history of other malignant neoplasm of skin: Secondary | ICD-10-CM | POA: Diagnosis not present

## 2023-05-02 DIAGNOSIS — Z08 Encounter for follow-up examination after completed treatment for malignant neoplasm: Secondary | ICD-10-CM | POA: Diagnosis not present

## 2023-05-02 DIAGNOSIS — C44519 Basal cell carcinoma of skin of other part of trunk: Secondary | ICD-10-CM | POA: Diagnosis not present

## 2023-05-02 DIAGNOSIS — X32XXXD Exposure to sunlight, subsequent encounter: Secondary | ICD-10-CM | POA: Diagnosis not present

## 2023-05-03 ENCOUNTER — Ambulatory Visit (HOSPITAL_BASED_OUTPATIENT_CLINIC_OR_DEPARTMENT_OTHER): Payer: Medicare Other | Admitting: Psychiatry

## 2023-05-03 ENCOUNTER — Encounter (HOSPITAL_COMMUNITY): Payer: Self-pay | Admitting: Psychiatry

## 2023-05-03 VITALS — BP 132/82 | HR 74 | Ht 63.0 in | Wt 182.0 lb

## 2023-05-03 DIAGNOSIS — F324 Major depressive disorder, single episode, in partial remission: Secondary | ICD-10-CM | POA: Diagnosis not present

## 2023-05-03 MED ORDER — MIRTAZAPINE 30 MG PO TABS: ORAL_TABLET | ORAL | 5 refills | Status: DC

## 2023-05-03 MED ORDER — LORAZEPAM 0.5 MG PO TABS: ORAL_TABLET | ORAL | 4 refills | Status: DC

## 2023-05-03 MED ORDER — VENLAFAXINE HCL ER 150 MG PO CP24: ORAL_CAPSULE | ORAL | 5 refills | Status: DC

## 2023-05-03 MED ORDER — MIRTAZAPINE 15 MG PO TBDP: ORAL_TABLET | ORAL | 5 refills | Status: DC

## 2023-05-03 NOTE — Progress Notes (Signed)
Psychiatric Initial Adult Assessment   Patient Identification: George Wang MRN:  [id number] Date of Evaluation:  05/03/2023 Referral Source: Community/emergency room Chief Complaint:   Visit Diagnosis: Major depression  History of Present Illness:       Today the patient is seen in the office by myself.  His wife is having too many physical problems to appear.  He says that she is not getting out very much at all.  She has hip problems and back problems she is very limited.  He does not seem to be upset about it all that much.  He consistently complains that every morning that he wakes up he feels down and low.  As the morning progresses that he has some coughing eat some breakfast by approximately noon he feels much better.  That at times later in the day he might get down.  He says the depression that he feels in the morning is very uncomfortable.  Yet he does claim that it goes away by mood most of the time.  He has adjusted his Ativan to taking 0.5 mg 2 in the morning and 1 late in the afternoon and he may take an extra 1.  He says the Ativan helps a great deal.  He can feel it almost right away.  I shared with him that this is about the highest dose that I feel comfortable giving him which is 1 mg twice daily.  He says regardless of the Ativan he still feels depressed every morning despite being on 225 mg of Effexor and Remeron 15 mg in the morning and 30 mg at night. Depression Symptoms:  depressed mood, (Hypo) Manic Symptoms:   Anxiety Symptoms:  Excessive Worry, Psychotic Symptoms:   PTSD Symptoms:   Past Psychiatric History: 1 psychiatric hospitalization in 1989 presently on Lexapro 10 mg, BuSpar 30 mg  Previous Psychotropic Medications: Yes   Substance Abuse History in the last 12 months:  No.  Consequences of Substance Abuse:   Past Medical History:  Past Medical History:  Diagnosis Date   Allergy    mild   Anemia    Iron deficiency   Anxiety state, unspecified     Aortic atherosclerosis (HCC)    Arthritis    Calculus of kidney    kidney stones  - normal BMP    Cancer (HCC)    Prostate, skin cancer x 2    Cataract    forming both eyes   Depressive disorder, not elsewhere classified    Diverticulosis of colon (without mention of hemorrhage)    GERD (gastroesophageal reflux disease)    on nexium   Gout, unspecified    Heart murmur    Hyperlipidemia    Hypertension, essential    Irritable bowel syndrome    Neuromuscular disorder (HCC)    numbness in knees from back issues    Personal history of malignant neoplasm of prostate    PVC (premature ventricular contraction)    Type II or unspecified type diabetes mellitus without mention of complication, not stated as uncontrolled     Past Surgical History:  Procedure Laterality Date   COLONOSCOPY     HERNIA REPAIR     left side   POLYPECTOMY     PROSTATE SURGERY     skin cancer removed     chest x 2 3-4 yrs ago    TONSILLECTOMY      Family Psychiatric History:   Family History:  Family History  Problem Relation Age of Onset  Cancer Maternal Aunt        breast   Pancreatic cancer Paternal Aunt    Prostate cancer Paternal Uncle    Heart disease Maternal Grandmother 44   Heart disease Maternal Grandfather        fluid / CHF    Heart disease Paternal Grandfather 41   Heart attack Paternal Grandfather        passed at 22   Prostate cancer Father    Coronary artery disease Father 71       CABG   Hypertension Mother    Hyperlipidemia Mother    Other Daughter        accident with tornado 06-01-1997   Hypertension Son    Colon cancer Neg Hx    Colon polyps Neg Hx    Esophageal cancer Neg Hx    Rectal cancer Neg Hx    Stomach cancer Neg Hx     Social History:   Social History   Socioeconomic History   Marital status: Married    Spouse name: Harriett Sine   Number of children: 2   Years of education: 13   Highest education level: Some college, no degree  Occupational History    Occupation: Charity fundraiser    Comment: Part time  Tobacco Use   Smoking status: Former    Current packs/day: 0.00    Average packs/day: 1 pack/day for 19.0 years (19.0 ttl pk-yrs)    Types: Cigarettes    Start date: 10/13/1959    Quit date: 10/13/1978    Years since quitting: 44.5   Smokeless tobacco: Former    Types: Snuff    Quit date: 05/30/2008  Vaping Use   Vaping status: Never Used  Substance and Sexual Activity   Alcohol use: Not Currently   Drug use: No   Sexual activity: Yes  Other Topics Concern   Not on file  Social History Narrative   Lives at home with wife.  He has one son and a daughter that died in a tornado in 78. 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 He has one living grandson and one grandson that passed away in 06/01/17 at age 4 from cancer.   Social Determinants of Health   Financial Resource Strain: Low Risk  (10/20/2022)   Overall Financial Resource Strain (CARDIA)    Difficulty of Paying Living Expenses: Not hard at all  Food Insecurity: No Food Insecurity (10/20/2022)   Hunger Vital Sign    Worried About Running Out of Food in the Last Year: Never true    Ran Out of Food in the Last Year: Never true  Transportation Needs: No Transportation Needs (10/20/2022)   PRAPARE - Administrator, Civil Service (Medical): No    Lack of Transportation (Non-Medical): No  Physical Activity: Inactive (10/20/2022)   Exercise Vital Sign    Days of Exercise per Week: 0 days    Minutes of Exercise per Session: 0 min  Stress: No Stress Concern Present (10/20/2022)   [employer] of Occupational Health - Occupational Stress Questionnaire    Feeling of Stress : Not at all  Social Connections: Socially Integrated (10/20/2022)   Social Connection and Isolation Panel [NHANES]    Frequency of Communication with Friends and Family: More than three times a week    Frequency of Social Gatherings with Friends and Family: Once a week    Attends  Religious Services: More than 4 times per year    Active Member of  Clubs or Organizations: Yes    Attends Banker Meetings: 1 to 4 times per year    Marital Status: Married    Additional Social History:   Allergies:  No Known Allergies  Metabolic Disorder Labs: Lab Results  Component Value Date   HGBA1C 7.0 (H) 02/21/2023   No results found for: "PROLACTIN" Lab Results  Component Value Date   CHOL 103 02/21/2023   TRIG 66 02/21/2023   HDL 44 02/21/2023   CHOLHDL 2.3 02/21/2023   LDLCALC 45 02/21/2023   LDLCALC 71 11/17/2022   Lab Results  Component Value Date   TSH 1.330 12/05/2018    Therapeutic Level Labs: No results found for: "LITHIUM" No results found for: "CBMZ" No results found for: "VALPROATE"  Current Medications: Current Outpatient Medications  Medication Sig Dispense Refill   acetaminophen (TYLENOL) 325 MG tablet Take 650 mg by mouth every 6 (six) hours as needed for mild pain.     amLODipine (NORVASC) 10 MG tablet TAKE ONE (1) TABLET EACH DAY 90 tablet 3   atorvastatin (LIPITOR) 80 MG tablet TAKE 1/2 TABLET DAILY AT 6PM 45 tablet 3   beta carotene 78469 UNIT capsule Take 25,000 Units by mouth daily.     Blood Glucose Calibration (OT ULTRA/FASTTK CNTRL SOLN) SOLN USE WITH METER AS DIRECTED DX E11.9 1 each 1   blood glucose meter kit and supplies Dispense based on patient and insurance preference. Use up to four times daily as directed. (FOR ICD-10 E10.9, E11.9). 1 each 1   busPIRone (BUSPAR) 15 MG tablet 2 qam   2  qhs 120 tablet 5   chlorthalidone (HYGROTON) 25 MG tablet Take 0.5 tablets (12.5 mg total) by mouth daily. 45 tablet 3   Cholecalciferol (VITAMIN D) 2000 UNITS CAPS Take 1 capsule by mouth daily.     Dulaglutide (TRULICITY) 1.5 MG/0.5ML SOPN Inject 1.5 mg into the skin once a week. 2 mL 5   FeFum-FePo-FA-B Cmp-C-Zn-Mn-Cu (SE-TAN PLUS) 162-115.2-1 MG CAPS TAKE ONE (1) CAPSULE BY MOUTH 2 TIMES DAILY 180 capsule 0   fish oil-omega-3  fatty acids 1000 MG capsule Take 1 g by mouth 2 (two) times daily.      glucose blood (ONETOUCH ULTRA) test strip TEST BLOOD SUGARS UP TO 4 TIMES DAILY DX E11.9 400 strip 3   losartan (COZAAR) 100 MG tablet Take 1 tablet (100 mg total) by mouth daily. 90 tablet 3   metFORMIN (GLUCOPHAGE) 500 MG tablet Take 2 tablets (1,000 mg total) by mouth 2 (two) times daily with a meal. 360 tablet 3   Multiple Vitamins-Minerals (CENTRUM SILVER PO) Take 1 tablet by mouth daily.      omeprazole (PRILOSEC) 20 MG capsule Take 1 capsule (20 mg total) by mouth daily. 90 capsule 3   Testosterone 20.25 MG/ACT (1.62%) GEL 1 pump topically to each shoulder (2.5gm) qam 75 g 5   LORazepam (ATIVAN) 0.5 MG tablet 2  qam  1  qhs   1  PRN 120 tablet 4   mirtazapine (REMERON SOL-TAB) 15 MG disintegrating tablet Take one 15 mg tablet each morning. 30 tablet 5   mirtazapine (REMERON) 30 MG tablet 1  qhs 30 tablet 5   venlafaxine XR (EFFEXOR XR) 150 MG 24 hr capsule 2 qam 60 capsule 5   No current facility-administered medications for this visit.    Musculoskeletal: Strength & Muscle Tone: within normal limits Gait & Station: normal Patient leans: N/A  Psychiatric Specialty Exam: Review of Systems  Blood pressure  132/82, pulse 74, height 5\' 3"  (1.6 m), weight 182 lb (82.6 kg).Body mass index is 32.24 kg/m.  General Appearance: Casual  Eye Contact:  Good  Speech:  Clear and Coherent  Volume:  Normal  Mood:  Depressed  Affect:  Appropriate  Thought Process:  Coherent  Orientation:  Full (Time, Place, and Person)  Thought Content:  WDL  Suicidal Thoughts:  No  Homicidal Thoughts:  No  Memory:  Negative  Judgement:  Good  Insight:  Good  Psychomotor Activity:  Normal  Concentration:    Recall:  Good  Fund of Knowledge:Fair  Language: Good  Akathisia:  No  Handed:  Right  AIMS (if indicated):  not done  Assets:  Desire for Improvement  ADL's:  Intact  Cognition: WNL  Sleep:  Good   Screenings: GAD-7     Flowsheet Row Office Visit from 02/21/2023 in [facility] Office Visit from 11/17/2022 in [facility] Office Visit from 08/18/2022 in [facility] Office Visit from 05/14/2022 in [facility] Office Visit from 02/15/2022 in [facility]  Total GAD-7 Score 6 7 5 5 6       Mini-Mental    Flowsheet Row Clinical Support from 05/16/2017 in [facility]  Total Score (max 30 points ) 29      PHQ2-9    Flowsheet Row Office Visit from 02/21/2023 in [facility] Office Visit from 11/17/2022 in [facility] Clinical Support from 10/20/2022 in [facility] Office Visit from 08/18/2022 in [facility] Office Visit from 05/14/2022 in [facility]  PHQ-2 Total Score 2 4 0 2 2  PHQ-9 Total Score 4 9 -- 5 4       Assessment and Plan:  10/15/20242:42 PM     This patient's diagnosis is major clinical depression.  At this time we will increase his antidepressant just 1 more level.  I am hesitant to go much higher than this.  He does have well-controlled hypertension and today's blood pressure was 130/80.  Nonetheless a good increase his Effexor to a dose of 150 mg 2 in the morning.  He will continue Remeron 15 in the morning and 30 at night.  His other diagnosis is adjustment disorder with an anxious mood state.  He will continue taking BuSpar 30 mg twice daily.  He will take Ativan 0.5 mg 2 in the morning and then will take 1 every day today in the afternoon and he may take an extra 1.  Would never take more than 4 Ativan today.  In some ways it does not seem like this is a horrible state of being depressed.  He says it is every morning and he is  very uncomfortable with it.  I will see him back in 7 weeks.

## 2023-05-04 ENCOUNTER — Other Ambulatory Visit (HOSPITAL_COMMUNITY): Payer: Self-pay | Admitting: *Deleted

## 2023-05-04 MED ORDER — VENLAFAXINE HCL ER 150 MG PO CP24: ORAL_CAPSULE | ORAL | 5 refills | Status: DC

## 2023-05-30 ENCOUNTER — Ambulatory Visit (INDEPENDENT_AMBULATORY_CARE_PROVIDER_SITE_OTHER): Payer: Medicare Other | Admitting: Family Medicine

## 2023-05-30 ENCOUNTER — Encounter: Payer: Self-pay | Admitting: Family Medicine

## 2023-05-30 VITALS — BP 124/76 | HR 62 | Ht 63.0 in | Wt 178.0 lb

## 2023-05-30 DIAGNOSIS — E785 Hyperlipidemia, unspecified: Secondary | ICD-10-CM

## 2023-05-30 DIAGNOSIS — I152 Hypertension secondary to endocrine disorders: Secondary | ICD-10-CM | POA: Diagnosis not present

## 2023-05-30 DIAGNOSIS — E1169 Type 2 diabetes mellitus with other specified complication: Secondary | ICD-10-CM | POA: Diagnosis not present

## 2023-05-30 DIAGNOSIS — Z7984 Long term (current) use of oral hypoglycemic drugs: Secondary | ICD-10-CM

## 2023-05-30 DIAGNOSIS — Z23 Encounter for immunization: Secondary | ICD-10-CM

## 2023-05-30 DIAGNOSIS — E1159 Type 2 diabetes mellitus with other circulatory complications: Secondary | ICD-10-CM | POA: Diagnosis not present

## 2023-05-30 DIAGNOSIS — E1165 Type 2 diabetes mellitus with hyperglycemia: Secondary | ICD-10-CM

## 2023-05-30 LAB — BAYER DCA HB A1C WAIVED: HB A1C (BAYER DCA - WAIVED): 7.2 % — ABNORMAL HIGH (ref 4.8–5.6)

## 2023-05-30 MED ORDER — TIRZEPATIDE 2.5 MG/0.5ML ~~LOC~~ SOAJ
2.5000 mg | SUBCUTANEOUS | 3 refills | Status: DC
Start: 2023-05-30 — End: 2023-08-11

## 2023-05-30 NOTE — Progress Notes (Signed)
BP 124/76   Pulse 62   Ht 5\' 3"  (1.6 m)   Wt 178 lb (80.7 kg)   SpO2 97%   BMI 31.53 kg/m    Subjective:   Patient ID: George Wang, male    DOB: 11-28-1945, 77 y.o.   MRN: [id number]  HPI: George Wang is a 77 y.o. male presenting on 05/30/2023 for Medical Management of Chronic Issues, Diabetes, Hypertension, and Hyperlipidemia   HPI Type 2 diabetes mellitus Patient comes in today for recheck of his diabetes. Patient has been currently taking metformin and Trulicity. Patient is currently on an ACE inhibitor/ARB. Patient has seen an ophthalmologist this year. Patient denies any new issues with their feet. The symptom started onset as an adult hypertension and hyperlipidemia ARE RELATED TO DM   Hypertension Patient is currently on amlodipine and chlorthalidone and losartan, and their blood pressure today is 124/76. Patient denies any lightheadedness or dizziness. Patient denies headaches, blurred vision, chest pains, shortness of breath, or weakness. Denies any side effects from medication and is content with current medication.   Hyperlipidemia Patient is coming in for recheck of his hyperlipidemia. The patient is currently taking atorvastatin. They deny any issues with myalgias or history of liver damage from it. They deny any focal numbness or weakness or chest pain.   Relevant past medical, surgical, family and social history reviewed and updated as indicated. Interim medical history since our last visit reviewed. Allergies and medications reviewed and updated.  Review of Systems  Constitutional:  Negative for chills and fever.  Eyes:  Negative for visual disturbance.  Respiratory:  Negative for shortness of breath and wheezing.   Cardiovascular:  Negative for chest pain and leg swelling.  Musculoskeletal:  Negative for back pain and gait problem.  Skin:  Negative for rash.  Neurological:  Negative for dizziness, weakness and light-headedness.  All other systems  reviewed and are negative.   Per HPI unless specifically indicated above   Allergies as of 05/30/2023   No Known Allergies      Medication List        Accurate as of May 30, 2023 11:02 AM. If you have any questions, ask your nurse or doctor.          STOP taking these medications    Trulicity 1.5 MG/0.5ML Soaj Generic drug: Dulaglutide Stopped by: Elige Radon Sianni Cloninger       TAKE these medications    acetaminophen 325 MG tablet Commonly known as: TYLENOL Take 650 mg by mouth every 6 (six) hours as needed for mild pain.   amLODipine 10 MG tablet Commonly known as: NORVASC TAKE ONE (1) TABLET EACH DAY   atorvastatin 80 MG tablet Commonly known as: LIPITOR TAKE 1/2 TABLET DAILY AT 6PM   beta carotene 74259 UNIT capsule Take 25,000 Units by mouth daily.   blood glucose meter kit and supplies Dispense based on patient and insurance preference. Use up to four times daily as directed. (FOR ICD-10 E10.9, E11.9).   busPIRone 15 MG tablet Commonly known as: BUSPAR 2 qam   2  qhs   CENTRUM SILVER PO Take 1 tablet by mouth daily.   chlorthalidone 25 MG tablet Commonly known as: HYGROTON Take 0.5 tablets (12.5 mg total) by mouth daily.   fish oil-omega-3 fatty acids 1000 MG capsule Take 1 g by mouth 2 (two) times daily.   LORazepam 0.5 MG tablet Commonly known as: Ativan 2  qam  1  qhs   1  PRN   losartan 100 MG tablet Commonly known as: COZAAR Take 1 tablet (100 mg total) by mouth daily.   metFORMIN 500 MG tablet Commonly known as: GLUCOPHAGE Take 2 tablets (1,000 mg total) by mouth 2 (two) times daily with a meal.   mirtazapine 30 MG tablet Commonly known as: Remeron 1  qhs   mirtazapine 15 MG disintegrating tablet Commonly known as: REMERON SOL-TAB Take one 15 mg tablet each morning.   omeprazole 20 MG capsule Commonly known as: PRILOSEC Take 1 capsule (20 mg total) by mouth daily.   OneTouch Ultra test strip Generic drug: glucose  blood TEST BLOOD SUGARS UP TO 4 TIMES DAILY DX E11.9   OT ULTRA/FASTTK CNTRL SOLN Soln USE WITH METER AS DIRECTED DX E11.9   Se-Tan PLUS 162-115.2-1 MG Caps TAKE ONE (1) CAPSULE BY MOUTH 2 TIMES DAILY   Testosterone 20.25 MG/ACT (1.62%) Gel 1 pump topically to each shoulder (2.5gm) qam   tirzepatide 2.5 MG/0.5ML Pen Commonly known as: MOUNJARO Inject 2.5 mg into the skin once a week. Started by: Elige Radon Shaketha Jeon   venlafaxine XR 150 MG 24 hr capsule Commonly known as: Effexor XR Take 2 capsules (300 mg total) with breakfast.   Vitamin D 50 MCG (2000 UT) Caps Take 1 capsule by mouth daily.         Objective:   BP 124/76   Pulse 62   Ht 5\' 3"  (1.6 m)   Wt 178 lb (80.7 kg)   SpO2 97%   BMI 31.53 kg/m   Wt Readings from Last 3 Encounters:  05/30/23 178 lb (80.7 kg)  02/21/23 177 lb (80.3 kg)  11/17/22 179 lb (81.2 kg)    Physical Exam Vitals and nursing note reviewed.  Constitutional:      General: He is not in acute distress.    Appearance: He is well-developed. He is not diaphoretic.  Eyes:     General: No scleral icterus.    Conjunctiva/sclera: Conjunctivae normal.  Neck:     Thyroid: No thyromegaly.  Cardiovascular:     Rate and Rhythm: Normal rate and regular rhythm.     Heart sounds: Normal heart sounds. No murmur heard. Pulmonary:     Effort: Pulmonary effort is normal. No respiratory distress.     Breath sounds: Normal breath sounds. No wheezing.  Musculoskeletal:        General: No swelling. Normal range of motion.     Cervical back: Neck supple.  Lymphadenopathy:     Cervical: No cervical adenopathy.  Skin:    General: Skin is warm and dry.     Findings: No rash.  Neurological:     Mental Status: He is alert and oriented to person, place, and time.     Coordination: Coordination normal.  Psychiatric:        Behavior: Behavior normal.     Results for orders placed or performed in visit on 05/03/23  HM DIABETES EYE EXAM  Result Value  Ref Range   HM Diabetic Eye Exam No Retinopathy No Retinopathy    Assessment & Plan:   Problem List Items Addressed This Visit       Cardiovascular and Mediastinum   Hypertension associated with diabetes (HCC)   Relevant Medications   tirzepatide (MOUNJARO) 2.5 MG/0.5ML Pen   Other Relevant Orders   CBC with Differential/Platelet   CMP14+EGFR   Lipid panel   Bayer DCA Hb A1c Waived   Microalbumin / creatinine urine ratio     Endocrine  Hyperlipidemia associated with type 2 diabetes mellitus (HCC)   Relevant Medications   tirzepatide (MOUNJARO) 2.5 MG/0.5ML Pen   Other Relevant Orders   CBC with Differential/Platelet   CMP14+EGFR   Lipid panel   Bayer DCA Hb A1c Waived   Microalbumin / creatinine urine ratio   Type 2 diabetes mellitus (HCC) - Primary   Relevant Medications   tirzepatide (MOUNJARO) 2.5 MG/0.5ML Pen   Other Relevant Orders   CBC with Differential/Platelet   CMP14+EGFR   Lipid panel   Bayer DCA Hb A1c Waived   Microalbumin / creatinine urine ratio    A1c was up slightly.  He did not tolerate the Trulicity and has not been taking it.  Will try Mounjaro  Blood pressure and everything else looks good. Follow up plan: Return in about 3 months (around 08/30/2023), or if symptoms worsen or fail to improve, for Diabetes and hypertension and cholesterol recheck.  Counseling provided for all of the vaccine components Orders Placed This Encounter  Procedures   CBC with Differential/Platelet   CMP14+EGFR   Lipid panel   Bayer DCA Hb A1c Waived   Microalbumin / creatinine urine ratio    Arville Care, MD [facility] 05/30/2023, 11:02 AM

## 2023-05-31 LAB — CMP14+EGFR
ALT: 19 IU/L (ref 0–44)
AST: 60 IU/L — ABNORMAL HIGH (ref 0–40)
Albumin: 4.3 g/dL (ref 3.8–4.8)
Alkaline Phosphatase: 90 [IU]/L (ref 44–121)
BUN/Creatinine Ratio: 21 (ref 10–24)
BUN: 28 mg/dL — ABNORMAL HIGH (ref 8–27)
Bilirubin Total: 0.4 mg/dL (ref 0.0–1.2)
CO2: 25 mmol/L (ref 20–29)
Calcium: 9.6 mg/dL (ref 8.6–10.2)
Chloride: 99 mmol/L (ref 96–106)
Creatinine, Ser: 1.33 mg/dL — ABNORMAL HIGH (ref 0.76–1.27)
Globulin, Total: 2.3 g/dL (ref 1.5–4.5)
Glucose: 200 mg/dL — ABNORMAL HIGH (ref 70–99)
Potassium: 4.8 mmol/L (ref 3.5–5.2)
Sodium: 139 mmol/L (ref 134–144)
Total Protein: 6.6 g/dL (ref 6.0–8.5)
eGFR: 54 mL/min/{1.73_m2} — ABNORMAL LOW (ref >59)

## 2023-05-31 LAB — LIPID PANEL
Chol/HDL Ratio: 3.3 ratio (ref 0.0–5.0)
Cholesterol, Total: 130 mg/dL (ref 100–199)
HDL: 40 mg/dL (ref >39)
LDL Chol Calc (NIH): 73 mg/dL (ref 0–99)
Triglycerides: 88 mg/dL (ref 0–149)
VLDL Cholesterol Cal: 17 mg/dL (ref 5–40)

## 2023-05-31 LAB — CBC WITH DIFFERENTIAL/PLATELET
Basophils Absolute: 0.1 10*3/uL (ref 0.0–0.2)
Basos: 1 %
EOS (ABSOLUTE): 0.2 10*3/uL (ref 0.0–0.4)
Eos: 3 %
Hematocrit: 38.3 % (ref 37.5–51.0)
Hemoglobin: 12.7 g/dL — ABNORMAL LOW (ref 13.0–17.7)
Immature Grans (Abs): 0 10*3/uL (ref 0.0–0.1)
Immature Granulocytes: 0 %
Lymphocytes Absolute: 1.9 10*3/uL (ref 0.7–3.1)
Lymphs: 23 %
MCH: 33.1 pg — ABNORMAL HIGH (ref 26.6–33.0)
MCHC: 33.2 g/dL (ref 31.5–35.7)
MCV: 100 fL — ABNORMAL HIGH (ref 79–97)
Monocytes Absolute: 0.7 10*3/uL (ref 0.1–0.9)
Monocytes: 9 %
Neutrophils Absolute: 5.3 10*3/uL (ref 1.4–7.0)
Neutrophils: 64 %
Platelets: 322 10*3/uL (ref 150–450)
RBC: 3.84 x10E6/uL — ABNORMAL LOW (ref 4.14–5.80)
RDW: 12.2 % (ref 11.6–15.4)
WBC: 8.3 10*3/uL (ref 3.4–10.8)

## 2023-05-31 LAB — MICROALBUMIN / CREATININE URINE RATIO
Creatinine, Urine: 274.4 mg/dL
Microalb/Creat Ratio: 51 mg/g{creat} — ABNORMAL HIGH (ref 0–29)
Microalbumin, Urine: 140.8 ug/mL

## 2023-06-03 ENCOUNTER — Telehealth: Payer: Self-pay | Admitting: Family Medicine

## 2023-06-03 NOTE — Telephone Encounter (Signed)
Pt returning call from PCPs nurse, Morrie Sheldon. Wants to be called back to discuss lab results.

## 2023-06-03 NOTE — Telephone Encounter (Signed)
Pt made aware of results and recommendations. He has no further concerns.

## 2023-06-13 DIAGNOSIS — C44519 Basal cell carcinoma of skin of other part of trunk: Secondary | ICD-10-CM | POA: Diagnosis not present

## 2023-06-13 DIAGNOSIS — B078 Other viral warts: Secondary | ICD-10-CM | POA: Diagnosis not present

## 2023-07-11 ENCOUNTER — Other Ambulatory Visit: Payer: Self-pay | Admitting: Family Medicine

## 2023-08-03 ENCOUNTER — Ambulatory Visit (HOSPITAL_BASED_OUTPATIENT_CLINIC_OR_DEPARTMENT_OTHER): Payer: Medicare Other | Admitting: Psychiatry

## 2023-08-03 DIAGNOSIS — F325 Major depressive disorder, single episode, in full remission: Secondary | ICD-10-CM

## 2023-08-03 MED ORDER — BUSPIRONE HCL 15 MG PO TABS: ORAL_TABLET | ORAL | 5 refills | Status: DC

## 2023-08-03 MED ORDER — VENLAFAXINE HCL ER 150 MG PO CP24: ORAL_CAPSULE | ORAL | 5 refills | Status: DC

## 2023-08-03 MED ORDER — MIRTAZAPINE 15 MG PO TBDP: ORAL_TABLET | ORAL | 5 refills | Status: DC

## 2023-08-03 MED ORDER — MIRTAZAPINE 30 MG PO TABS: ORAL_TABLET | ORAL | 5 refills | Status: DC

## 2023-08-03 NOTE — Progress Notes (Signed)
 Psychiatric Initial Adult Assessment   Patient Identification: George Wang MRN:  [id number] Date of Evaluation:  08/03/2023 Referral Source: Community/emergency room Chief Complaint:   Visit Diagnosis: Major depression  History of Present Illness:       Today the patient is at his baseline.  He does not seem to be quite as dysphoric as in the past.  I think he experiences some transient depression most likely in the morning but it abates by noon.  I do not think his depression is persistent or pervasive and I think it causes no dysfunction.  He is sleeping and eating well.  His grandson is good to get married in a few months.  He still works when given the opportunity.  His wife is still limited by a lot of physical problems so he is seen alone today.  I believe his anxiety is pretty well controlled.  He drinks no alcohol.  He has never been psychotic.  He is very stable on the present regime of medicines.  His health is good.  Financially he is stable. Depression Symptoms:  depressed mood, (Hypo) Manic Symptoms:   Anxiety Symptoms:  Excessive Worry, Psychotic Symptoms:   PTSD Symptoms:   Past Psychiatric History: 1 psychiatric hospitalization in 1989 presently on Lexapro  10 mg, BuSpar  30 mg  Previous Psychotropic Medications: Yes   Substance Abuse History in the last 12 months:  No.  Consequences of Substance Abuse:   Past Medical History:  Past Medical History:  Diagnosis Date   Allergy    mild   Anemia    Iron deficiency   Anxiety state, unspecified    Aortic atherosclerosis (HCC)    Arthritis    Calculus of kidney    kidney stones  - normal BMP    Cancer (HCC)    Prostate, skin cancer x 2    Cataract    forming both eyes   Depressive disorder, not elsewhere classified    Diverticulosis of colon (without mention of hemorrhage)    GERD (gastroesophageal reflux disease)    on nexium   Gout, unspecified    Heart murmur    Hyperlipidemia    Hypertension,  essential    Irritable bowel syndrome    Neuromuscular disorder (HCC)    numbness in knees from back issues    Personal history of malignant neoplasm of prostate    PVC (premature ventricular contraction)    Type II or unspecified type diabetes mellitus without mention of complication, not stated as uncontrolled     Past Surgical History:  Procedure Laterality Date   COLONOSCOPY     HERNIA REPAIR     left side   POLYPECTOMY     PROSTATE SURGERY     skin cancer removed     chest x 2 3-4 yrs ago    TONSILLECTOMY      Family Psychiatric History:   Family History:  Family History  Problem Relation Age of Onset   Cancer Maternal Aunt        breast   Pancreatic cancer Paternal Aunt    Prostate cancer Paternal Uncle    Heart disease Maternal Grandmother 61   Heart disease Maternal Grandfather        fluid / CHF    Heart disease Paternal Grandfather 41   Heart attack Paternal Grandfather        passed at 32   Prostate cancer Father    Coronary artery disease Father 26  CABG   Hypertension Mother    Hyperlipidemia Mother    Other Daughter        accident with tornado Dec 24, 1996   Hypertension Son    Colon cancer Neg Hx    Colon polyps Neg Hx    Esophageal cancer Neg Hx    Rectal cancer Neg Hx    Stomach cancer Neg Hx     Social History:   Social History   Socioeconomic History   Marital status: Married    Spouse name: Haskell Linker   Number of children: 2   Years of education: 13   Highest education level: Some college, no degree  Occupational History   Occupation: Charity fundraiser    Comment: Part time  Tobacco Use   Smoking status: Former    Current packs/day: 0.00    Average packs/day: 1 pack/day for 19.0 years (19.0 ttl pk-yrs)    Types: Cigarettes    Start date: 10/13/1959    Quit date: 10/13/1978    Years since quitting: 44.8   Smokeless tobacco: Former    Types: Snuff    Quit date: 05/30/2008  Vaping Use   Vaping status: Never Used  Substance  and Sexual Activity   Alcohol use: Not Currently   Drug use: No   Sexual activity: Yes  Other Topics Concern   Not on file  Social History Narrative   Lives at home with wife.  He has one son and a daughter that died in a tornado in 73. 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 He has one living grandson and one grandson that passed away in 12-24-16 at age 16 from cancer.   Social Drivers of Corporate investment banker Strain: Low Risk  (10/20/2022)   Overall Financial Resource Strain (CARDIA)    Difficulty of Paying Living Expenses: Not hard at all  Food Insecurity: No Food Insecurity (10/20/2022)   Hunger Vital Sign    Worried About Running Out of Food in the Last Year: Never true    Ran Out of Food in the Last Year: Never true  Transportation Needs: No Transportation Needs (10/20/2022)   PRAPARE - Administrator, Civil Service (Medical): No    Lack of Transportation (Non-Medical): No  Physical Activity: Inactive (10/20/2022)   Exercise Vital Sign    Days of Exercise per Week: 0 days    Minutes of Exercise per Session: 0 min  Stress: No Stress Concern Present (10/20/2022)   [employer] of Occupational Health - Occupational Stress Questionnaire    Feeling of Stress : Not at all  Social Connections: Socially Integrated (10/20/2022)   Social Connection and Isolation Panel [NHANES]    Frequency of Communication with Friends and Family: More than three times a week    Frequency of Social Gatherings with Friends and Family: Once a week    Attends Religious Services: More than 4 times per year    Active Member of [employer] or Organizations: Yes    Attends Banker Meetings: 1 to 4 times per year    Marital Status: Married    Additional Social History:   Allergies:  No Known Allergies  Metabolic Disorder Labs: Lab Results  Component Value Date   HGBA1C 7.2 (H) 05/30/2023   No results found for: "PROLACTIN" Lab Results  Component Value Date   CHOL 130  05/30/2023   TRIG 88 05/30/2023   HDL 40 05/30/2023   CHOLHDL 3.3 05/30/2023   LDLCALC 73 05/30/2023  LDLCALC 45 02/21/2023   Lab Results  Component Value Date   TSH 1.330 12/05/2018    Therapeutic Level Labs: No results found for: "LITHIUM" No results found for: "CBMZ" No results found for: "VALPROATE"  Current Medications: Current Outpatient Medications  Medication Sig Dispense Refill   acetaminophen  (TYLENOL ) 325 MG tablet Take 650 mg by mouth every 6 (six) hours as needed for mild pain.     amLODipine  (NORVASC ) 10 MG tablet TAKE ONE (1) TABLET EACH DAY 90 tablet 3   atorvastatin  (LIPITOR) 80 MG tablet TAKE 1/2 TABLET DAILY AT 6PM 45 tablet 3   beta carotene 25000 UNIT capsule Take 25,000 Units by mouth daily.     Blood Glucose Calibration (OT ULTRA/FASTTK CNTRL SOLN) SOLN USE WITH METER AS DIRECTED DX E11.9 1 each 1   blood glucose meter kit and supplies Dispense based on patient and insurance preference. Use up to four times daily as directed. (FOR ICD-10 E10.9, E11.9). 1 each 1   busPIRone  (BUSPAR ) 15 MG tablet 2 qam   2  qhs 120 tablet 5   chlorthalidone  (HYGROTON ) 25 MG tablet Take 0.5 tablets (12.5 mg total) by mouth daily. 45 tablet 3   Cholecalciferol (VITAMIN D ) 2000 UNITS CAPS Take 1 capsule by mouth daily.     FeFum-FePo-FA-B Cmp-C-Zn-Mn-Cu (SE-TAN PLUS ) 162-115.2-1 MG CAPS TAKE ONE (1) CAPSULE BY MOUTH 2 TIMES DAILY 120 capsule 0   fish oil-omega-3 fatty acids 1000 MG capsule Take 1 g by mouth 2 (two) times daily.      glucose blood (ONETOUCH ULTRA) test strip TEST BLOOD SUGARS UP TO 4 TIMES DAILY DX E11.9 400 strip 3   LORazepam  (ATIVAN ) 0.5 MG tablet 2  qam  1  qhs   1  PRN 120 tablet 4   losartan  (COZAAR ) 100 MG tablet Take 1 tablet (100 mg total) by mouth daily. 90 tablet 3   metFORMIN  (GLUCOPHAGE ) 500 MG tablet Take 2 tablets (1,000 mg total) by mouth 2 (two) times daily with a meal. 360 tablet 3   mirtazapine  (REMERON  SOL-TAB) 15 MG disintegrating tablet Take  one 15 mg tablet each morning. 30 tablet 5   mirtazapine  (REMERON ) 30 MG tablet 1  qhs 30 tablet 5   Multiple Vitamins-Minerals (CENTRUM SILVER PO) Take 1 tablet by mouth daily.      omeprazole  (PRILOSEC) 20 MG capsule Take 1 capsule (20 mg total) by mouth daily. 90 capsule 3   Testosterone  20.25 MG/ACT (1.62%) GEL 1 pump topically to each shoulder (2.5gm) qam 75 g 5   tirzepatide (MOUNJARO) 2.5 MG/0.5ML Pen Inject 2.5 mg into the skin once a week. 2 mL 3   venlafaxine  XR (EFFEXOR  XR) 150 MG 24 hr capsule Take 2 capsules (300 mg total) with breakfast. 60 capsule 5   No current facility-administered medications for this visit.    Musculoskeletal: Strength & Muscle Tone: within normal limits Gait & Station: normal Patient leans: N/A  Psychiatric Specialty Exam: Review of Systems  There were no vitals taken for this visit.There is no height or weight on file to calculate BMI.  General Appearance: Casual  Eye Contact:  Good  Speech:  Clear and Coherent  Volume:  Normal  Mood:  Depressed  Affect:  Appropriate  Thought Process:  Coherent  Orientation:  Full (Time, Place, and Person)  Thought Content:  WDL  Suicidal Thoughts:  No  Homicidal Thoughts:  No  Memory:  Negative  Judgement:  Good  Insight:  Good  Psychomotor Activity:  Normal  Concentration:    Recall:  Good  Fund of Knowledge:Fair  Language: Good  Akathisia:  No  Handed:  Right  AIMS (if indicated):  not done  Assets:  Desire for Improvement  ADL's:  Intact  Cognition: WNL  Sleep:  Good   Screenings: GAD-7    Flowsheet Row Office Visit from 05/30/2023 in [facility] Office Visit from 02/21/2023 in [facility] Office Visit from 11/17/2022 in [facility] Office Visit from 08/18/2022 in [facility] Office Visit from 05/14/2022 in [facility]  Total  GAD-7 Score 10 6 7 5 5       Mini-Mental    Flowsheet Row Clinical Support from 05/16/2017 in [facility]  Total Score (max 30 points ) 29      PHQ2-9    Flowsheet Row Office Visit from 05/30/2023 in [facility] Office Visit from 02/21/2023 in [facility] Office Visit from 11/17/2022 in [facility] Clinical Support from 10/20/2022 in [facility] Office Visit from 08/18/2022 in [facility]  PHQ-2 Total Score 4 2 4  0 2  PHQ-9 Total Score 6 4 9  -- 5       Assessment and Plan:  1/15/20253:25 PM    On this patient's last visit he seemed moderately more depressed.  We increased his Effexor  to the maximum dose of 300 mg.  I believe he is better.  His diagnosis is major depression and he takes 300 mg of Effexor  and Remeron  15 mg in the morning and 30 mg at night.  His second problem is an adjustment disorder with an anxious mood state.  He takes Ativan  0.5 mg 2 in the morning 1 midday and 1 as needed.  He also takes BuSpar  30 mg twice daily.  The patient is doing well functioning very well and will return to see me in 4 months.

## 2023-08-06 ENCOUNTER — Emergency Department (HOSPITAL_COMMUNITY)
Admission: EM | Admit: 2023-08-06 | Discharge: 2023-08-06 | Disposition: A | Discharge status: Home / Self Care | Payer: Medicare Other | Source: Home / Self Care | Attending: Emergency Medicine | Admitting: Emergency Medicine

## 2023-08-06 ENCOUNTER — Emergency Department (HOSPITAL_COMMUNITY): Payer: Medicare Other

## 2023-08-06 ENCOUNTER — Encounter (HOSPITAL_COMMUNITY): Payer: Self-pay | Admitting: *Deleted

## 2023-08-06 ENCOUNTER — Other Ambulatory Visit: Payer: Self-pay

## 2023-08-06 DIAGNOSIS — E119 Type 2 diabetes mellitus without complications: Secondary | ICD-10-CM | POA: Insufficient documentation

## 2023-08-06 DIAGNOSIS — Z83438 Family history of other disorder of lipoprotein metabolism and other lipidemia: Secondary | ICD-10-CM | POA: Diagnosis not present

## 2023-08-06 DIAGNOSIS — S81812A Laceration without foreign body, left lower leg, initial encounter: Secondary | ICD-10-CM | POA: Insufficient documentation

## 2023-08-06 DIAGNOSIS — Z23 Encounter for immunization: Secondary | ICD-10-CM | POA: Insufficient documentation

## 2023-08-06 DIAGNOSIS — S2242XA Multiple fractures of ribs, left side, initial encounter for closed fracture: Secondary | ICD-10-CM | POA: Insufficient documentation

## 2023-08-06 DIAGNOSIS — Z87891 Personal history of nicotine dependence: Secondary | ICD-10-CM | POA: Diagnosis not present

## 2023-08-06 DIAGNOSIS — E876 Hypokalemia: Secondary | ICD-10-CM | POA: Diagnosis not present

## 2023-08-06 DIAGNOSIS — Z7984 Long term (current) use of oral hypoglycemic drugs: Secondary | ICD-10-CM | POA: Insufficient documentation

## 2023-08-06 DIAGNOSIS — Z7985 Long-term (current) use of injectable non-insulin antidiabetic drugs: Secondary | ICD-10-CM | POA: Diagnosis not present

## 2023-08-06 DIAGNOSIS — R0902 Hypoxemia: Secondary | ICD-10-CM | POA: Diagnosis not present

## 2023-08-06 DIAGNOSIS — I10 Essential (primary) hypertension: Secondary | ICD-10-CM | POA: Diagnosis not present

## 2023-08-06 DIAGNOSIS — W08XXXA Fall from other furniture, initial encounter: Secondary | ICD-10-CM | POA: Insufficient documentation

## 2023-08-06 DIAGNOSIS — I700 Atherosclerosis of aorta: Secondary | ICD-10-CM | POA: Diagnosis not present

## 2023-08-06 DIAGNOSIS — J9811 Atelectasis: Secondary | ICD-10-CM | POA: Diagnosis not present

## 2023-08-06 DIAGNOSIS — E785 Hyperlipidemia, unspecified: Secondary | ICD-10-CM | POA: Diagnosis not present

## 2023-08-06 DIAGNOSIS — Z8551 Personal history of malignant neoplasm of bladder: Secondary | ICD-10-CM | POA: Diagnosis not present

## 2023-08-06 DIAGNOSIS — G8911 Acute pain due to trauma: Secondary | ICD-10-CM | POA: Diagnosis not present

## 2023-08-06 DIAGNOSIS — Z85828 Personal history of other malignant neoplasm of skin: Secondary | ICD-10-CM | POA: Diagnosis not present

## 2023-08-06 DIAGNOSIS — S2242XD Multiple fractures of ribs, left side, subsequent encounter for fracture with routine healing: Secondary | ICD-10-CM | POA: Diagnosis not present

## 2023-08-06 DIAGNOSIS — Z8249 Family history of ischemic heart disease and other diseases of the circulatory system: Secondary | ICD-10-CM | POA: Diagnosis not present

## 2023-08-06 DIAGNOSIS — R079 Chest pain, unspecified: Secondary | ICD-10-CM | POA: Diagnosis not present

## 2023-08-06 DIAGNOSIS — D509 Iron deficiency anemia, unspecified: Secondary | ICD-10-CM | POA: Diagnosis not present

## 2023-08-06 DIAGNOSIS — Z79899 Other long term (current) drug therapy: Secondary | ICD-10-CM | POA: Diagnosis not present

## 2023-08-06 DIAGNOSIS — W19XXXA Unspecified fall, initial encounter: Secondary | ICD-10-CM

## 2023-08-06 MED ORDER — TETANUS-DIPHTH-ACELL PERTUSSIS 5-2.5-18.5 LF-MCG/0.5 IM SUSY
0.5000 mL | PREFILLED_SYRINGE | Freq: Once | INTRAMUSCULAR | Status: AC
  Administered 2023-08-06: 0.5 mL via INTRAMUSCULAR
  Filled 2023-08-06: qty 0.5

## 2023-08-06 MED ORDER — HYDROMORPHONE HCL 1 MG/ML IJ SOLN
1.0000 mg | Freq: Once | INTRAMUSCULAR | Status: AC
  Administered 2023-08-06: 1 mg via INTRAMUSCULAR
  Filled 2023-08-06: qty 1

## 2023-08-06 MED ORDER — OXYCODONE-ACETAMINOPHEN 5-325 MG PO TABS: ORAL_TABLET | ORAL | 0 refills | Status: DC

## 2023-08-06 NOTE — ED Triage Notes (Signed)
Pt fell off a stool and landing on left side, c/o left rib pain. Hurts to take a deep breath.  Pt thinks he hit his head, denies any blood thinners. Abrasion to left shin.

## 2023-08-06 NOTE — Discharge Instructions (Signed)
Follow-up with your doctor this week.  [facility] need to come out in 10 days.  Clean laceration twice a day with soap and water

## 2023-08-06 NOTE — ED Provider Notes (Signed)
[facility] EMERGENCY DEPARTMENT AT [facility] Provider Note   CSN: [id number] Arrival date & time: 08/06/23  1758     History {Add pertinent medical, surgical, social history, OB history to HPI:1} Chief Complaint  Patient presents with   George Wang is a 78 y.o. male.  Patient fell and hurt his left side of his chest.  No loss of consciousness.  Patient has a history of diabetes   Fall       Home Medications Prior to Admission medications   Medication Sig Start Date End Date Taking? Authorizing Provider  oxyCODONE-acetaminophen (PERCOCET/ROXICET) 5-325 MG tablet Take 1 every 6 hours for pain not relieved by Tylenol or Motrin alone 08/06/23  Yes Bethann Berkshire, MD  acetaminophen (TYLENOL) 325 MG tablet Take 650 mg by mouth every 6 (six) hours as needed for mild pain.    [provider]  amLODipine (NORVASC) 10 MG tablet TAKE ONE (1) TABLET EACH DAY 11/17/22   Dettinger, Elige Radon, MD  atorvastatin (LIPITOR) 80 MG tablet TAKE 1/2 TABLET DAILY AT 6PM 11/17/22   Dettinger, Elige Radon, MD  beta carotene 82956 UNIT capsule Take 25,000 Units by mouth daily.    [provider]  Blood Glucose Calibration (OT ULTRA/FASTTK CNTRL SOLN) SOLN USE WITH METER AS DIRECTED DX E11.9 02/16/21   Dettinger, Elige Radon, MD  blood glucose meter kit and supplies Dispense based on patient and insurance preference. Use up to four times daily as directed. (FOR ICD-10 E10.9, E11.9). 10/05/17   Ernestina Penna, MD  busPIRone (BUSPAR) 15 MG tablet 2 qam   2  qhs 08/03/23   Archer Asa, MD  chlorthalidone (HYGROTON) 25 MG tablet Take 0.5 tablets (12.5 mg total) by mouth daily. 11/17/22   Dettinger, Elige Radon, MD  Cholecalciferol (VITAMIN D) 2000 UNITS CAPS Take 1 capsule by mouth daily.    [provider]  FeFum-FePo-FA-B Cmp-C-Zn-Mn-Cu (SE-TAN PLUS) 162-115.2-1 MG CAPS TAKE ONE (1) CAPSULE BY MOUTH 2 TIMES DAILY 07/11/23   Dettinger, Elige Radon, MD  fish oil-omega-3 fatty  acids 1000 MG capsule Take 1 g by mouth 2 (two) times daily.     [provider]  glucose blood (ONETOUCH ULTRA) test strip TEST BLOOD SUGARS UP TO 4 TIMES DAILY DX E11.9 08/03/22   Dettinger, Elige Radon, MD  LORazepam (ATIVAN) 0.5 MG tablet 2  qam  1  qhs   1  PRN 05/03/23   Plovsky, Earvin Hansen, MD  losartan (COZAAR) 100 MG tablet Take 1 tablet (100 mg total) by mouth daily. 11/17/22   Dettinger, Elige Radon, MD  metFORMIN (GLUCOPHAGE) 500 MG tablet Take 2 tablets (1,000 mg total) by mouth 2 (two) times daily with a meal. 11/17/22   Dettinger, Elige Radon, MD  mirtazapine (REMERON SOL-TAB) 15 MG disintegrating tablet Take one 15 mg tablet each morning. 08/03/23   Archer Asa, MD  mirtazapine (REMERON) 30 MG tablet 1  qhs 08/03/23   Plovsky, Earvin Hansen, MD  Multiple Vitamins-Minerals (CENTRUM SILVER PO) Take 1 tablet by mouth daily.     [provider]  omeprazole (PRILOSEC) 20 MG capsule Take 1 capsule (20 mg total) by mouth daily. 11/17/22   Dettinger, Elige Radon, MD  Testosterone 20.25 MG/ACT (1.62%) GEL 1 pump topically to each shoulder (2.5gm) qam 09/30/22   Bjorn Pippin, MD  tirzepatide [facility]) 2.5 MG/0.5ML Pen Inject 2.5 mg into the skin once a week. 05/30/23   Dettinger, Elige Radon, MD  venlafaxine XR (EFFEXOR XR)  150 MG 24 hr capsule Take 2 capsules (300 mg total) with breakfast. 08/03/23   Plovsky, Earvin Hansen, MD  febuxostat (ULORIC) 40 MG tablet Take 80 mg by mouth daily.  10/13/11  [provider]  simvastatin (ZOCOR) 80 MG tablet Take 80 mg by mouth at bedtime.  07/03/18  [provider]      Allergies    Patient has no known allergies.    Review of Systems   Review of Systems  Physical Exam Updated Vital Signs BP 125/74 (BP Location: Right Arm)   Pulse 88   Temp 97.8 F (36.6 C) (Oral)   Resp 17   Ht 5\' 3"  (1.6 m)   Wt 77.1 kg   SpO2 96%   BMI 30.11 kg/m  Physical Exam  ED Results / Procedures / Treatments   Labs (all labs ordered are listed, but only  abnormal results are displayed) Labs Reviewed - No data to display  EKG None  Radiology DG Ribs Unilateral W/Chest Left Result Date: 08/06/2023 CLINICAL DATA:  Fall from a seated position with left rib pain. EXAM: LEFT RIBS AND CHEST - 3+ VIEW COMPARISON:  Chest radiograph dated 05/09/2017. FINDINGS: There are acute fractures of the left fifth through seventh ribs posteriorly. There is mild left basilar atelectasis/airspace disease. There is no evidence of pneumothorax or pleural effusion. Heart size and mediastinal contours are within normal limits. Vascular calcifications are seen in the aortic arch. IMPRESSION: Acute fractures of the left fifth through seventh ribs posteriorly. No evidence of pneumothorax. Electronically Signed   By: Romona Curls M.D.   On: 08/06/2023 18:43    Procedures Procedures  {Document cardiac monitor, telemetry assessment procedure when appropriate:1}  Medications Ordered in ED Medications  Tdap (BOOSTRIX) injection 0.5 mL (has no administration in time range)  HYDROmorphone (DILAUDID) injection 1 mg (1 mg Intramuscular Given 08/06/23 1938)    ED Course/ Medical Decision Making/ A&P  Patient evaluated fractures and 3 cm laceration to left lower leg {   Click here for ABCD2, HEART and other calculatorsREFRESH Note before signing :1}                              Medical Decision Making Amount and/or Complexity of Data Reviewed Radiology: ordered.  Risk Prescription drug management.  Patient with 3 rib fractures.  Patient is not hypoxic or dyspneic.  He is given pain medicines and will follow-up with PCP. 3 staples used to close the laceration {Document critical care time when appropriate:1} {Document review of labs and clinical decision tools ie heart score, Chads2Vasc2 etc:1}  {Document your independent review of radiology images, and any outside records:1} {Document your discussion with family members, caretakers, and with consultants:1} {Document  social determinants of health affecting pt's care:1} {Document your decision making why or why not admission, treatments were needed:1} Final Clinical Impression(s) / ED Diagnoses Final diagnoses:  Fall, initial encounter    Rx / DC Orders ED Discharge Orders          Ordered    oxyCODONE-acetaminophen (PERCOCET/ROXICET) 5-325 MG tablet        08/06/23 2035

## 2023-08-08 ENCOUNTER — Telehealth: Payer: Self-pay | Admitting: Family Medicine

## 2023-08-08 ENCOUNTER — Ambulatory Visit (INDEPENDENT_AMBULATORY_CARE_PROVIDER_SITE_OTHER): Payer: Medicare Other | Admitting: Family Medicine

## 2023-08-08 ENCOUNTER — Encounter: Payer: Self-pay | Admitting: Family Medicine

## 2023-08-08 ENCOUNTER — Ambulatory Visit (INDEPENDENT_AMBULATORY_CARE_PROVIDER_SITE_OTHER): Payer: Medicare Other

## 2023-08-08 ENCOUNTER — Other Ambulatory Visit: Payer: Self-pay

## 2023-08-08 ENCOUNTER — Inpatient Hospital Stay (HOSPITAL_COMMUNITY)
Admission: EM | Admit: 2023-08-08 | Discharge: 2023-08-11 | DRG: 948 | Disposition: A | Discharge status: Home Health | Payer: Medicare Other | Attending: Internal Medicine | Admitting: Internal Medicine

## 2023-08-08 VITALS — BP 134/72 | HR 92 | Temp 97.4°F | Ht 63.0 in

## 2023-08-08 DIAGNOSIS — I10 Essential (primary) hypertension: Secondary | ICD-10-CM | POA: Diagnosis present

## 2023-08-08 DIAGNOSIS — Z8249 Family history of ischemic heart disease and other diseases of the circulatory system: Secondary | ICD-10-CM | POA: Diagnosis not present

## 2023-08-08 DIAGNOSIS — M1990 Unspecified osteoarthritis, unspecified site: Secondary | ICD-10-CM | POA: Diagnosis present

## 2023-08-08 DIAGNOSIS — Z6830 Body mass index (BMI) 30.0-30.9, adult: Secondary | ICD-10-CM | POA: Diagnosis not present

## 2023-08-08 DIAGNOSIS — E119 Type 2 diabetes mellitus without complications: Secondary | ICD-10-CM | POA: Diagnosis present

## 2023-08-08 DIAGNOSIS — I700 Atherosclerosis of aorta: Secondary | ICD-10-CM | POA: Diagnosis not present

## 2023-08-08 DIAGNOSIS — R0600 Dyspnea, unspecified: Secondary | ICD-10-CM

## 2023-08-08 DIAGNOSIS — E66811 Obesity, class 1: Secondary | ICD-10-CM | POA: Diagnosis present

## 2023-08-08 DIAGNOSIS — G8911 Acute pain due to trauma: Principal | ICD-10-CM | POA: Diagnosis present

## 2023-08-08 DIAGNOSIS — Z87442 Personal history of urinary calculi: Secondary | ICD-10-CM

## 2023-08-08 DIAGNOSIS — Z83438 Family history of other disorder of lipoprotein metabolism and other lipidemia: Secondary | ICD-10-CM

## 2023-08-08 DIAGNOSIS — E785 Hyperlipidemia, unspecified: Secondary | ICD-10-CM | POA: Diagnosis present

## 2023-08-08 DIAGNOSIS — Z85828 Personal history of other malignant neoplasm of skin: Secondary | ICD-10-CM | POA: Diagnosis not present

## 2023-08-08 DIAGNOSIS — Z7984 Long term (current) use of oral hypoglycemic drugs: Secondary | ICD-10-CM

## 2023-08-08 DIAGNOSIS — S2242XA Multiple fractures of ribs, left side, initial encounter for closed fracture: Secondary | ICD-10-CM

## 2023-08-08 DIAGNOSIS — Z87891 Personal history of nicotine dependence: Secondary | ICD-10-CM

## 2023-08-08 DIAGNOSIS — F411 Generalized anxiety disorder: Secondary | ICD-10-CM | POA: Diagnosis present

## 2023-08-08 DIAGNOSIS — D509 Iron deficiency anemia, unspecified: Secondary | ICD-10-CM | POA: Diagnosis present

## 2023-08-08 DIAGNOSIS — S81812A Laceration without foreign body, left lower leg, initial encounter: Secondary | ICD-10-CM | POA: Diagnosis present

## 2023-08-08 DIAGNOSIS — W010XXA Fall on same level from slipping, tripping and stumbling without subsequent striking against object, initial encounter: Secondary | ICD-10-CM | POA: Diagnosis present

## 2023-08-08 DIAGNOSIS — K589 Irritable bowel syndrome without diarrhea: Secondary | ICD-10-CM | POA: Diagnosis present

## 2023-08-08 DIAGNOSIS — Z23 Encounter for immunization: Secondary | ICD-10-CM | POA: Diagnosis not present

## 2023-08-08 DIAGNOSIS — Z8551 Personal history of malignant neoplasm of bladder: Secondary | ICD-10-CM | POA: Diagnosis not present

## 2023-08-08 DIAGNOSIS — F32A Depression, unspecified: Secondary | ICD-10-CM | POA: Diagnosis present

## 2023-08-08 DIAGNOSIS — Z8042 Family history of malignant neoplasm of prostate: Secondary | ICD-10-CM

## 2023-08-08 DIAGNOSIS — E876 Hypokalemia: Secondary | ICD-10-CM | POA: Diagnosis present

## 2023-08-08 DIAGNOSIS — Z7985 Long-term (current) use of injectable non-insulin antidiabetic drugs: Secondary | ICD-10-CM

## 2023-08-08 DIAGNOSIS — S2232XA Fracture of one rib, left side, initial encounter for closed fracture: Secondary | ICD-10-CM

## 2023-08-08 DIAGNOSIS — Z8546 Personal history of malignant neoplasm of prostate: Secondary | ICD-10-CM

## 2023-08-08 DIAGNOSIS — Z79899 Other long term (current) drug therapy: Secondary | ICD-10-CM

## 2023-08-08 DIAGNOSIS — S2242XD Multiple fractures of ribs, left side, subsequent encounter for fracture with routine healing: Principal | ICD-10-CM

## 2023-08-08 DIAGNOSIS — J9811 Atelectasis: Secondary | ICD-10-CM | POA: Diagnosis not present

## 2023-08-08 DIAGNOSIS — R079 Chest pain, unspecified: Secondary | ICD-10-CM | POA: Diagnosis present

## 2023-08-08 DIAGNOSIS — Z8719 Personal history of other diseases of the digestive system: Secondary | ICD-10-CM

## 2023-08-08 DIAGNOSIS — Z9079 Acquired absence of other genital organ(s): Secondary | ICD-10-CM

## 2023-08-08 DIAGNOSIS — R0902 Hypoxemia: Secondary | ICD-10-CM | POA: Diagnosis present

## 2023-08-08 DIAGNOSIS — R0602 Shortness of breath: Secondary | ICD-10-CM

## 2023-08-08 DIAGNOSIS — K219 Gastro-esophageal reflux disease without esophagitis: Secondary | ICD-10-CM | POA: Diagnosis present

## 2023-08-08 DIAGNOSIS — Z79890 Hormone replacement therapy (postmenopausal): Secondary | ICD-10-CM

## 2023-08-08 LAB — CBC WITH DIFFERENTIAL/PLATELET
Abs Immature Granulocytes: 0.04 10*3/uL (ref 0.00–0.07)
Basophils Absolute: 0.1 10*3/uL (ref 0.0–0.1)
Basophils Relative: 1 %
Eosinophils Absolute: 0.1 10*3/uL (ref 0.0–0.5)
Eosinophils Relative: 1 %
HCT: 32.6 % — ABNORMAL LOW (ref 39.0–52.0)
Hemoglobin: 11.5 g/dL — ABNORMAL LOW (ref 13.0–17.0)
Immature Granulocytes: 0 %
Lymphocytes Relative: 12 %
Lymphs Abs: 1.4 10*3/uL (ref 0.7–4.0)
MCH: 34.2 pg — ABNORMAL HIGH (ref 26.0–34.0)
MCHC: 35.3 g/dL (ref 30.0–36.0)
MCV: 97 fL (ref 80.0–100.0)
Monocytes Absolute: 1.2 10*3/uL — ABNORMAL HIGH (ref 0.1–1.0)
Monocytes Relative: 11 %
Neutro Abs: 9 10*3/uL — ABNORMAL HIGH (ref 1.7–7.7)
Neutrophils Relative %: 75 %
Platelets: 217 10*3/uL (ref 150–400)
RBC: 3.36 MIL/uL — ABNORMAL LOW (ref 4.22–5.81)
RDW: 12.6 % (ref 11.5–15.5)
WBC: 11.8 10*3/uL — ABNORMAL HIGH (ref 4.0–10.5)
nRBC: 0 % (ref 0.0–0.2)

## 2023-08-08 LAB — BASIC METABOLIC PANEL
Anion gap: 9 (ref 5–15)
BUN: 29 mg/dL — ABNORMAL HIGH (ref 8–23)
CO2: 26 mmol/L (ref 22–32)
Calcium: 8.7 mg/dL — ABNORMAL LOW (ref 8.9–10.3)
Chloride: 99 mmol/L (ref 98–111)
Creatinine, Ser: 1.06 mg/dL (ref 0.61–1.24)
GFR, Estimated: >60 mL/min (ref >60)
Glucose, Bld: 231 mg/dL — ABNORMAL HIGH (ref 70–99)
Potassium: 3.8 mmol/L (ref 3.5–5.1)
Sodium: 134 mmol/L — ABNORMAL LOW (ref 135–145)

## 2023-08-08 MED ORDER — IBUPROFEN 200 MG PO TABS: 600.0000 mg | ORAL_TABLET | Freq: Four times a day (QID) | ORAL | Status: DC | PRN

## 2023-08-08 MED ORDER — MIRTAZAPINE 15 MG PO TBDP
15.0000 mg | ORAL_TABLET | Freq: Every day | ORAL | Status: DC
  Administered 2023-08-09 – 2023-08-11 (×3): 15 mg via ORAL
  Filled 2023-08-08 (×3): qty 1

## 2023-08-08 MED ORDER — VENLAFAXINE HCL ER 75 MG PO CP24: 300.0000 mg | ORAL_CAPSULE | Freq: Every day | ORAL | Status: DC

## 2023-08-08 MED ORDER — OXYCODONE HCL 5 MG PO TABS
5.0000 mg | ORAL_TABLET | ORAL | Status: DC | PRN
Start: 2023-08-08 — End: 2023-08-11
  Administered 2023-08-09 – 2023-08-11 (×7): 10 mg via ORAL
  Administered 2023-08-11: 5 mg via ORAL
  Filled 2023-08-08 (×8): qty 2

## 2023-08-08 MED ORDER — PANTOPRAZOLE SODIUM 40 MG PO TBEC
40.0000 mg | DELAYED_RELEASE_TABLET | Freq: Two times a day (BID) | ORAL | Status: DC
  Administered 2023-08-08 – 2023-08-11 (×6): 40 mg via ORAL
  Filled 2023-08-08 (×7): qty 1

## 2023-08-08 MED ORDER — LORAZEPAM 0.5 MG PO TABS
0.5000 mg | ORAL_TABLET | Freq: Two times a day (BID) | ORAL | Status: DC
  Administered 2023-08-08 – 2023-08-11 (×6): 0.5 mg via ORAL
  Filled 2023-08-08 (×6): qty 1

## 2023-08-08 MED ORDER — PANTOPRAZOLE SODIUM 40 MG PO TBEC: 40.0000 mg | DELAYED_RELEASE_TABLET | Freq: Every day | ORAL | Status: DC

## 2023-08-08 MED ORDER — AMLODIPINE BESYLATE 10 MG PO TABS
10.0000 mg | ORAL_TABLET | Freq: Every day | ORAL | Status: DC
  Administered 2023-08-08 – 2023-08-11 (×4): 10 mg via ORAL
  Filled 2023-08-08 (×4): qty 1

## 2023-08-08 MED ORDER — MORPHINE SULFATE (PF) 2 MG/ML IV SOLN
2.0000 mg | INTRAVENOUS | Status: DC | PRN
  Administered 2023-08-09: 2 mg via INTRAVENOUS
  Filled 2023-08-08: qty 1

## 2023-08-08 MED ORDER — BUSPIRONE HCL 5 MG PO TABS
15.0000 mg | ORAL_TABLET | Freq: Two times a day (BID) | ORAL | Status: DC
  Administered 2023-08-08 – 2023-08-11 (×6): 15 mg via ORAL
  Filled 2023-08-08 (×6): qty 1

## 2023-08-08 MED ORDER — CHLORTHALIDONE 25 MG PO TABS: 12.5000 mg | ORAL_TABLET | Freq: Every day | ORAL | Status: DC

## 2023-08-08 MED ORDER — BUSPIRONE HCL 10 MG PO TABS: 15.0000 mg | ORAL_TABLET | Freq: Two times a day (BID) | ORAL | Status: DC

## 2023-08-08 MED ORDER — CHLORTHALIDONE 25 MG PO TABS
12.5000 mg | ORAL_TABLET | Freq: Every day | ORAL | Status: DC
  Administered 2023-08-08 – 2023-08-11 (×4): 12.5 mg via ORAL
  Filled 2023-08-08 (×4): qty 0.5

## 2023-08-08 MED ORDER — ACETAMINOPHEN 500 MG PO TABS: 1000.0000 mg | ORAL_TABLET | Freq: Three times a day (TID) | ORAL | Status: DC

## 2023-08-08 MED ORDER — MIRTAZAPINE 30 MG PO TABS: 30.0000 mg | ORAL_TABLET | Freq: Every day | ORAL | Status: DC

## 2023-08-08 MED ORDER — METFORMIN HCL 500 MG PO TABS: 1000.0000 mg | ORAL_TABLET | Freq: Two times a day (BID) | ORAL | Status: DC

## 2023-08-08 MED ORDER — VENLAFAXINE HCL ER 150 MG PO CP24
300.0000 mg | ORAL_CAPSULE | Freq: Every day | ORAL | Status: DC
  Administered 2023-08-09 – 2023-08-11 (×3): 300 mg via ORAL
  Filled 2023-08-08 (×3): qty 2

## 2023-08-08 MED ORDER — LOSARTAN POTASSIUM 50 MG PO TABS
100.0000 mg | ORAL_TABLET | Freq: Every day | ORAL | Status: DC
  Administered 2023-08-08 – 2023-08-11 (×4): 100 mg via ORAL
  Filled 2023-08-08 (×4): qty 2

## 2023-08-08 MED ORDER — ACETAMINOPHEN 500 MG PO TABS
1000.0000 mg | ORAL_TABLET | Freq: Three times a day (TID) | ORAL | Status: DC
  Administered 2023-08-08 – 2023-08-11 (×8): 1000 mg via ORAL
  Filled 2023-08-08 (×8): qty 2

## 2023-08-08 MED ORDER — MIRTAZAPINE 15 MG PO TABS
30.0000 mg | ORAL_TABLET | Freq: Every day | ORAL | Status: DC
  Administered 2023-08-08 – 2023-08-10 (×3): 30 mg via ORAL
  Filled 2023-08-08 (×3): qty 2

## 2023-08-08 MED ORDER — HEPARIN SODIUM (PORCINE) 5000 UNIT/ML IJ SOLN
5000.0000 [IU] | Freq: Three times a day (TID) | INTRAMUSCULAR | Status: DC
  Administered 2023-08-08 – 2023-08-11 (×8): 5000 [IU] via SUBCUTANEOUS
  Filled 2023-08-08 (×8): qty 1

## 2023-08-08 MED ORDER — METFORMIN HCL 500 MG PO TABS
1000.0000 mg | ORAL_TABLET | Freq: Two times a day (BID) | ORAL | Status: DC
  Administered 2023-08-09 – 2023-08-11 (×5): 1000 mg via ORAL
  Filled 2023-08-08 (×5): qty 2

## 2023-08-08 MED ORDER — MIRTAZAPINE 7.5 MG PO TABS: 15.0000 mg | ORAL_TABLET | Freq: Every morning | ORAL | Status: DC

## 2023-08-08 MED ORDER — LIDOCAINE 5 % EX PTCH
2.0000 | MEDICATED_PATCH | CUTANEOUS | Status: DC
  Administered 2023-08-08 – 2023-08-10 (×3): 2 via TRANSDERMAL
  Filled 2023-08-08 (×3): qty 2

## 2023-08-08 MED ORDER — AMLODIPINE BESYLATE 5 MG PO TABS: 10.0000 mg | ORAL_TABLET | Freq: Every day | ORAL | Status: DC

## 2023-08-08 MED ORDER — MORPHINE SULFATE (PF) 4 MG/ML IV SOLN
4.0000 mg | Freq: Once | INTRAVENOUS | Status: AC
Start: 2023-08-08 — End: 2023-08-08
  Administered 2023-08-08: 4 mg via INTRAVENOUS
  Filled 2023-08-08: qty 1

## 2023-08-08 MED ORDER — HYDROMORPHONE HCL 2 MG PO TABS: 2.0000 mg | ORAL_TABLET | ORAL | 0 refills | Status: DC | PRN

## 2023-08-08 NOTE — Telephone Encounter (Signed)
Appt made

## 2023-08-08 NOTE — Patient Instructions (Signed)
DO NOT take lorazepam or any other potentially sedating medication while taking the oxycodone

## 2023-08-08 NOTE — Progress Notes (Signed)
Subjective:  Patient ID: George Wang, male    DOB: 19-Aug-1945  Age: 78 y.o. MRN: [id number]  CC: Hospitalization Follow-up (Fell 2 night ago, broke 3 ribs, staples placed in left leg. Unable to move. Pian has increased.left shoulder blade and down left side is where most of the pain. Can't breath well with movement. )   HPI George Wang presents for fall off of his porch. He fell off the porch was trying to lift wood. Landed on left side. Immediately felt severe pain. Taken to E.D. Found to have three fractured ribs, Left posterior 5,6,7. Nondisplaced at the time. Was Dced home with oxycodone scrip. It isn't touching the pain. Now pain is so severe that he cannot move himself. He slid down his chair overnight and got trapped in the chair in a hunched position. He can not breathe adequately to lay down. He cannot position himself adequately to avoid severe pain. He is not coughing. No hemoptysis.  He cannot stand from seated and is unsteady to walk. He has to  e supported to avid falling since the injury.      08/08/2023   10:57 AM 05/30/2023   11:23 AM 05/30/2023   11:22 AM  Depression screen PHQ 2/9  Decreased Interest 1  2  Down, Depressed, Hopeless 1  2  PHQ - 2 Score 2  4  Altered sleeping 0  1  Tired, decreased energy 1  1  Change in appetite 1  0  Feeling bad or failure about yourself  1  0  Trouble concentrating 0  0  Moving slowly or fidgety/restless 1  0  Suicidal thoughts 0  0  PHQ-9 Score 6  6  Difficult doing work/chores Somewhat difficult Somewhat difficult Not difficult at all    History George Wang has a past medical history of Allergy, Anemia, Anxiety state, unspecified, Aortic atherosclerosis (HCC), Arthritis, Calculus of kidney, Cancer (HCC), Cataract, Depressive disorder, not elsewhere classified, Diverticulosis of colon (without mention of hemorrhage), GERD (gastroesophageal reflux disease), Gout, unspecified, Heart murmur, Hyperlipidemia, Hypertension, essential,  Irritable bowel syndrome, Neuromuscular disorder (HCC), Personal history of malignant neoplasm of prostate, PVC (premature ventricular contraction), and Type II or unspecified type diabetes mellitus without mention of complication, not stated as uncontrolled.   He has a past surgical history that includes Hernia repair; Prostate surgery; Tonsillectomy; Colonoscopy; skin cancer removed; and Polypectomy.   His family history includes Cancer in his maternal aunt; Coronary artery disease (age of onset: 43) in his father; Heart attack in his paternal grandfather; Heart disease in his maternal grandfather; Heart disease (age of onset: 53) in his maternal grandmother; Heart disease (age of onset: 57) in his paternal grandfather; Hyperlipidemia in his mother; Hypertension in his mother and son; Other in his daughter; Pancreatic cancer in his paternal aunt; Prostate cancer in his father and paternal uncle.He reports that he quit smoking about 44 years ago. His smoking use included cigarettes. He started smoking about 63 years ago. He has a 19 pack-year smoking history. He quit smokeless tobacco use about 15 years ago.  His smokeless tobacco use included snuff. He reports that he does not currently use alcohol. He reports that he does not use drugs.    ROS Review of Systems  Constitutional:  Negative for fever.  Respiratory:  Positive for shortness of breath.   Cardiovascular:  Positive for chest pain (left side from under the scapula to the midaxillary line on the left.).  Musculoskeletal:  Positive for gait problem (  off balance, unsteady.) and myalgias. Negative for arthralgias.  Skin:  Negative for rash.  Neurological:  Negative for dizziness.    Objective:  BP 134/72   Pulse 92   Temp (!) 97.4 F (36.3 C)   Ht 5\' 3"  (1.6 m)   SpO2 92%   BMI 30.11 kg/m   BP Readings from Last 3 Encounters:  08/08/23 134/72  08/06/23 127/73  05/30/23 124/76    Wt Readings from Last 3 Encounters:  08/06/23  170 lb (77.1 kg)  05/30/23 178 lb (80.7 kg)  02/21/23 177 lb (80.3 kg)     Physical Exam Vitals reviewed.  Constitutional:      General: He is in acute distress.     Appearance: He is well-developed. He is ill-appearing.  HENT:     Head: Normocephalic and atraumatic.     Right Ear: External ear normal.     Left Ear: External ear normal.     Mouth/Throat:     Mouth: Mucous membranes are moist.     Pharynx: No oropharyngeal exudate or posterior oropharyngeal erythema.  Eyes:     Pupils: Pupils are equal, round, and reactive to light.  Cardiovascular:     Rate and Rhythm: Normal rate and regular rhythm.     Heart sounds: No murmur heard. Pulmonary:     Breath sounds: Wheezing (few, left sided) present.  Musculoskeletal:        General: Tenderness (marked at posterior left at margin of e scapula. Tender for motion of the shoulder.) present.     Cervical back: Normal range of motion and neck supple.  Neurological:     Mental Status: He is alert and oriented to person, place, and time.    XR - progression of the fractures. 6th rib now displaced with ends overlapping by 3/4 inch. 7th shows mild displacement as well.   Assessment & Plan:   George "Worth" was seen today for hospitalization follow-up.  Diagnoses and all orders for this visit:  Closed traumatic displaced fracture of rib on left side, initial encounter Comments: Posterior  5,6,7 with 6&7 displaced Orders: -     DG Ribs Unilateral W/Chest Left; Future       I am having George Wang "Worth" maintain his beta carotene, Multiple Vitamins-Minerals (CENTRUM SILVER PO), fish oil-omega-3 fatty acids, Vitamin D, acetaminophen, blood glucose meter kit and supplies, OT ULTRA/FASTTK CNTRL SOLN, OneTouch Ultra, Testosterone, amLODipine, atorvastatin, chlorthalidone, losartan, metFORMIN, omeprazole, LORazepam, tirzepatide, Se-Tan PLUS, busPIRone, venlafaxine XR, mirtazapine, mirtazapine, oxyCODONE-acetaminophen, and  ibuprofen.  Allergies as of 08/08/2023   No Known Allergies      Medication List        Accurate as of August 08, 2023 12:18 PM. If you have any questions, ask your nurse or doctor.          acetaminophen 325 MG tablet Commonly known as: TYLENOL Take 650 mg by mouth every 6 (six) hours as needed for mild pain.   amLODipine 10 MG tablet Commonly known as: NORVASC TAKE ONE (1) TABLET EACH DAY   atorvastatin 80 MG tablet Commonly known as: LIPITOR TAKE 1/2 TABLET DAILY AT 6PM   beta carotene 16109 UNIT capsule Take 25,000 Units by mouth daily.   blood glucose meter kit and supplies Dispense based on patient and insurance preference. Use up to four times daily as directed. (FOR ICD-10 E10.9, E11.9).   busPIRone 15 MG tablet Commonly known as: BUSPAR 2 qam   2  qhs   CENTRUM SILVER  PO Take 1 tablet by mouth daily.   chlorthalidone 25 MG tablet Commonly known as: HYGROTON Take 0.5 tablets (12.5 mg total) by mouth daily.   fish oil-omega-3 fatty acids 1000 MG capsule Take 1 g by mouth 2 (two) times daily.   ibuprofen 600 MG tablet Commonly known as: ADVIL Take 600 mg by mouth every 6 (six) hours as needed.   LORazepam 0.5 MG tablet Commonly known as: Ativan 2  qam  1  qhs   1  PRN   losartan 100 MG tablet Commonly known as: COZAAR Take 1 tablet (100 mg total) by mouth daily.   metFORMIN 500 MG tablet Commonly known as: GLUCOPHAGE Take 2 tablets (1,000 mg total) by mouth 2 (two) times daily with a meal.   mirtazapine 30 MG tablet Commonly known as: Remeron 1  qhs   mirtazapine 15 MG disintegrating tablet Commonly known as: REMERON SOL-TAB Take one 15 mg tablet each morning.   omeprazole 20 MG capsule Commonly known as: PRILOSEC Take 1 capsule (20 mg total) by mouth daily.   OneTouch Ultra test strip Generic drug: glucose blood TEST BLOOD SUGARS UP TO 4 TIMES DAILY DX E11.9   OT ULTRA/FASTTK CNTRL SOLN Soln USE WITH METER AS DIRECTED DX E11.9    oxyCODONE-acetaminophen 5-325 MG tablet Commonly known as: PERCOCET/ROXICET Take 1 every 6 hours for pain not relieved by Tylenol or Motrin alone   Se-Tan PLUS 162-115.2-1 MG Caps TAKE ONE (1) CAPSULE BY MOUTH 2 TIMES DAILY   Testosterone 20.25 MG/ACT (1.62%) Gel 1 pump topically to each shoulder (2.5gm) qam   tirzepatide 2.5 MG/0.5ML Pen Commonly known as: MOUNJARO Inject 2.5 mg into the skin once a week.   venlafaxine XR 150 MG 24 hr capsule Commonly known as: Effexor XR Take 2 capsules (300 mg total) with breakfast.   Vitamin D 50 MCG (2000 UT) Caps Take 1 capsule by mouth daily.       Pt. Caregiver (son & D[facility]I-L) directed to take pt. To hospital for admission due to progression of fractures, falling and dysnea. He isn't able to position himself adequately, and that has caused progression of the fractures. He is at high risk for lung puncture, pneumothorax. He would benefit from rehab for balalne.  Follow-up: No follow-ups on file.  Mechele Claude, M.D.

## 2023-08-08 NOTE — ED Provider Notes (Signed)
[place] EMERGENCY DEPARTMENT AT [facility] Provider Note   CSN: [id number] Arrival date & time: 08/08/23  1349     History  Chief Complaint  Patient presents with   Rib Injury    George Wang is a 78 y.o. male.  78 year old male with prior medical history as detailed below presents for evaluation.  Patient reports that he fell on Saturday.  He was seen and diagnosed with 3 broken ribs on his left.  He reports that he was given oxycodone for use at home for pain.  He presents from his PCP who saw him earlier today.  Per the patient and PCP evaluation the patient is having uncontrolled pain at home.  His ability to perform ADLs is significantly affected by his acute left rib fractures.  Patient referred to the ED for admission for pain control.  The history is provided by the patient and medical records.       Home Medications Prior to Admission medications   Medication Sig Start Date End Date Taking? Authorizing Provider  acetaminophen (TYLENOL) 325 MG tablet Take 650 mg by mouth every 6 (six) hours as needed for mild pain.    [provider]  amLODipine (NORVASC) 10 MG tablet TAKE ONE (1) TABLET EACH DAY 11/17/22   Dettinger, Elige Radon, MD  atorvastatin (LIPITOR) 80 MG tablet TAKE 1/2 TABLET DAILY AT 6PM 11/17/22   Dettinger, Elige Radon, MD  beta carotene 11914 UNIT capsule Take 25,000 Units by mouth daily.    [provider]  Blood Glucose Calibration (OT ULTRA/FASTTK CNTRL SOLN) SOLN USE WITH METER AS DIRECTED DX E11.9 02/16/21   Dettinger, Elige Radon, MD  blood glucose meter kit and supplies Dispense based on patient and insurance preference. Use up to four times daily as directed. (FOR ICD-10 E10.9, E11.9). 10/05/17   Ernestina Penna, MD  busPIRone (BUSPAR) 15 MG tablet 2 qam   2  qhs 08/03/23   Archer Asa, MD  chlorthalidone (HYGROTON) 25 MG tablet Take 0.5 tablets (12.5 mg total) by mouth daily. 11/17/22   Dettinger, Elige Radon, MD  Cholecalciferol  (VITAMIN D) 2000 UNITS CAPS Take 1 capsule by mouth daily.    [provider]  FeFum-FePo-FA-B Cmp-C-Zn-Mn-Cu (SE-TAN PLUS) 162-115.2-1 MG CAPS TAKE ONE (1) CAPSULE BY MOUTH 2 TIMES DAILY 07/11/23   Dettinger, Elige Radon, MD  fish oil-omega-3 fatty acids 1000 MG capsule Take 1 g by mouth 2 (two) times daily.     [provider]  glucose blood (ONETOUCH ULTRA) test strip TEST BLOOD SUGARS UP TO 4 TIMES DAILY DX E11.9 08/03/22   Dettinger, Elige Radon, MD  HYDROmorphone (DILAUDID) 2 MG tablet Take 1 tablet (2 mg total) by mouth every 4 (four) hours as needed for severe pain (pain score 7-10). 08/08/23   Mechele Claude, MD  ibuprofen (ADVIL) 600 MG tablet Take 600 mg by mouth every 6 (six) hours as needed. 05/23/23   [provider]  LORazepam (ATIVAN) 0.5 MG tablet 2  qam  1  qhs   1  PRN 05/03/23   Plovsky, Earvin Hansen, MD  losartan (COZAAR) 100 MG tablet Take 1 tablet (100 mg total) by mouth daily. 11/17/22   Dettinger, Elige Radon, MD  metFORMIN (GLUCOPHAGE) 500 MG tablet Take 2 tablets (1,000 mg total) by mouth 2 (two) times daily with a meal. 11/17/22   Dettinger, Elige Radon, MD  mirtazapine (REMERON SOL-TAB) 15 MG disintegrating tablet Take one 15 mg tablet each morning. 08/03/23  Archer Asa, MD  mirtazapine (REMERON) 30 MG tablet 1  qhs 08/03/23   Plovsky, Earvin Hansen, MD  Multiple Vitamins-Minerals (CENTRUM SILVER PO) Take 1 tablet by mouth daily.     [provider]  omeprazole (PRILOSEC) 20 MG capsule Take 1 capsule (20 mg total) by mouth daily. 11/17/22   Dettinger, Elige Radon, MD  Testosterone 20.25 MG/ACT (1.62%) GEL 1 pump topically to each shoulder (2.5gm) qam 09/30/22   Bjorn Pippin, MD  tirzepatide [facility]) 2.5 MG/0.5ML Pen Inject 2.5 mg into the skin once a week. 05/30/23   Dettinger, Elige Radon, MD  venlafaxine XR (EFFEXOR XR) 150 MG 24 hr capsule Take 2 capsules (300 mg total) with breakfast. 08/03/23   Plovsky, Earvin Hansen, MD  febuxostat (ULORIC) 40 MG tablet Take 80 mg by mouth  daily.  10/13/11  [provider]  simvastatin (ZOCOR) 80 MG tablet Take 80 mg by mouth at bedtime.  07/03/18  [provider]      Allergies    Patient has no known allergies.    Review of Systems   Review of Systems  All other systems reviewed and are negative.   Physical Exam Updated Vital Signs BP 122/69   Pulse 91   Temp (!) 97.2 F (36.2 C) (Oral)   Resp (!) 22   Ht 5\' 3"  (1.6 m)   Wt 78 kg   SpO2 97%   BMI 30.46 kg/m  Physical Exam Vitals and nursing note reviewed.  Constitutional:      General: He is in acute distress.     Appearance: Normal appearance. He is well-developed.     Comments: Alert, appears quite uncomfortable with any movement or deep breath.  Complains of significant pain to the left ribs.  HENT:     Head: Normocephalic and atraumatic.  Eyes:     Conjunctiva/sclera: Conjunctivae normal.     Pupils: Pupils are equal, round, and reactive to light.  Cardiovascular:     Rate and Rhythm: Normal rate and regular rhythm.     Heart sounds: Normal heart sounds.  Pulmonary:     Effort: Pulmonary effort is normal. No respiratory distress.     Breath sounds: Normal breath sounds.  Abdominal:     General: There is no distension.     Palpations: Abdomen is soft.     Tenderness: There is no abdominal tenderness.  Musculoskeletal:        General: No deformity. Normal range of motion.     Cervical back: Normal range of motion and neck supple.  Skin:    General: Skin is warm and dry.  Neurological:     General: No focal deficit present.     Mental Status: He is alert and oriented to person, place, and time.     ED Results / Procedures / Treatments   Labs (all labs ordered are listed, but only abnormal results are displayed) Labs Reviewed  BASIC METABOLIC PANEL  CBC WITH DIFFERENTIAL/PLATELET    EKG None  Radiology DG Ribs Unilateral W/Chest Left Result Date: 08/06/2023 CLINICAL DATA:  Fall from a seated position with left rib  pain. EXAM: LEFT RIBS AND CHEST - 3+ VIEW COMPARISON:  Chest radiograph dated 05/09/2017. FINDINGS: There are acute fractures of the left fifth through seventh ribs posteriorly. There is mild left basilar atelectasis/airspace disease. There is no evidence of pneumothorax or pleural effusion. Heart size and mediastinal contours are within normal limits. Vascular calcifications are seen in the aortic arch. IMPRESSION: Acute fractures of the  left fifth through seventh ribs posteriorly. No evidence of pneumothorax. Electronically Signed   By: Romona Curls M.D.   On: 08/06/2023 18:43    Procedures Procedures    Medications Ordered in ED Medications  morphine (PF) 4 MG/ML injection 4 mg (has no administration in time range)    ED Course/ Medical Decision Making/ A&P                                 Medical Decision Making Amount and/or Complexity of Data Reviewed Labs: ordered.  Risk Prescription drug management. Decision regarding hospitalization.    Medical Screen Complete  This patient presented to the ED with complaint of left sided rib fractures.  This complaint involves an extensive number of treatment options. The initial differential diagnosis includes, but is not limited to, acute pain secondary to acute left-sided rib fractures  This presentation is: Acute, Self-Limited, Previously Undiagnosed, Uncertain Prognosis, and Complicated  Patient with recent fall and diagnosis of 3 left-sided rib fractures.  Patient is returning secondary to significant pain and interference with ADLs.  Patient would benefit from admission for pain control.  Hospitalist service is aware of case and will evaluate for admission.    Additional history obtained:  External records from outside sources obtained and reviewed including prior ED visits and prior Inpatient records.    Lab Tests:  I ordered and personally interpreted labs.  The pertinent results include: CBC, BMP  Cardiac  Monitoring:  The patient was maintained on a cardiac monitor.  I personally viewed and interpreted the cardiac monitor which showed an underlying rhythm of: NSR   Medicines ordered:  I ordered medication including morphine for pain Reevaluation of the patient after these medicines showed that the patient: improved   Problem List / ED Course:  Acute left-sided rib fractures, intractable pain   Reevaluation:  After the interventions noted above, I reevaluated the patient and found that they have: improved  Disposition:  After consideration of the diagnostic results and the patients response to treatment, I feel that the patent would benefit from admission         Final Clinical Impression(s) / ED Diagnoses Final diagnoses:  Closed fracture of multiple ribs of left side with routine healing, subsequent encounter    Rx / DC Orders ED Discharge Orders     None         Wynetta Fines, MD 08/08/23 2208

## 2023-08-08 NOTE — ED Triage Notes (Addendum)
Patient to ED by POV with c/o rib pain. Per patient he fell on Saturday and was seen in [place] and was told he had 3 broken ribs. He was given Oxycodone with no relief. Reports increased pain when inhaling. He was just released from [facility] and was prescribed hydromorphone. He has not picked up RX states he came to [facility] instead.

## 2023-08-08 NOTE — Telephone Encounter (Signed)
Please schedule patient for a 10 day follow up with PCP on 1/30 at 3:55 for 30 minutes per Dr Darlyn Read. Needs to be 30 min because its for stitches removal.  I sent out a request to have appts opened so that I could schedule the appt but staff are all in a meeting right now.   Already made pt aware of date and time of appt.

## 2023-08-08 NOTE — H&P (Addendum)
[facility] HPI  George Wang WNU:[id number] DOB: October 12, 1945 DOA: 08/08/2023 From: Home code Status full  PCP: Dettinger, Elige Radon, MD   Chief Complaint:   fall  HPI:  78 year old white male Previous bladder/prostate cancer status post prostatectomy Gleason stage VI 2005 [facility]-testosterone DM TY 2 HTN HLD Depression (previously hospitalized 1989) recently seen by psychiatrist 1/15-Effexor increased to max dose 300 Remeron 15 morning 30 night Ativan BuSpar also  1/18 [facility] ED presented with fall-was on his back porch trying to pick up 3 cords of wood and tripped and missed his footing no dizziness no chest pain no LOC no concussion but felt immediate pain Workup = acute fractures of left 5th through 7th rib posteriorly with no pneumothorax-patient sent home with oxycodone  1/20 visit PCP-oxycodone did not help slid down the chair got trapped could not breathe cannot stand and is unsteady to walk and has had to be supported since a fall--- had been prescribed Dilaudid but was not able to pick it up and was unable to manage at home Lives alone with wife who has hip repair and cannot take care of him  1/20 brought to emergency room with intractable pain they had been trying IcyHot at home he was not able to get relief with positioning and had severe pain with movement and inability to tolerate the same  Review of Systems:  As mentioned above in HPI are pertinent +'s Pertinent negatives as per below   ED Course: 1 dose of morphine EDP indicated that he would call anesthesia to see if nerve block could be arranged   Past Medical History:  Diagnosis Date   Allergy    mild   Anemia    Iron deficiency   Anxiety state, unspecified    Aortic atherosclerosis (HCC)    Arthritis    Calculus of kidney    kidney stones  - normal BMP    Cancer (HCC)    Prostate, skin cancer x 2    Cataract    forming both eyes   Depressive disorder, not elsewhere classified     Diverticulosis of colon (without mention of hemorrhage)    GERD (gastroesophageal reflux disease)    on nexium   Gout, unspecified    Heart murmur    Hyperlipidemia    Hypertension, essential    Irritable bowel syndrome    Neuromuscular disorder (HCC)    numbness in knees from back issues    Personal history of malignant neoplasm of prostate    PVC (premature ventricular contraction)    Type II or unspecified type diabetes mellitus without mention of complication, not stated as uncontrolled    Past Surgical History:  Procedure Laterality Date   COLONOSCOPY     HERNIA REPAIR     left side   POLYPECTOMY     PROSTATE SURGERY     skin cancer removed     chest x 2 3-4 yrs ago    TONSILLECTOMY      reports that he quit smoking about 44 years ago. His smoking use included cigarettes. He started smoking about 63 years ago. He has a 19 pack-year smoking history. He quit smokeless tobacco use about 15 years ago.  His smokeless tobacco use included snuff. He reports that he does not currently use alcohol. He reports that he does not use drugs. Usually at baseline does a lot of activity Mobility:   No Known Allergies Family History  Problem Relation Age of Onset   Cancer  Maternal Aunt        breast   Pancreatic cancer Paternal Aunt    Prostate cancer Paternal Uncle    Heart disease Maternal Grandmother 85   Heart disease Maternal Grandfather        fluid / CHF    Heart disease Paternal Grandfather 38   Heart attack Paternal Grandfather        passed at 54   Prostate cancer Father    Coronary artery disease Father 67       CABG   Hypertension Mother    Hyperlipidemia Mother    Other Daughter        accident with tornado 1998   Hypertension Son    Colon cancer Neg Hx    Colon polyps Neg Hx    Esophageal cancer Neg Hx    Rectal cancer Neg Hx    Stomach cancer Neg Hx    Prior to Admission medications   Medication Sig Start Date End Date Taking? Authorizing Provider   acetaminophen (TYLENOL) 325 MG tablet Take 650 mg by mouth every 6 (six) hours as needed for mild pain.    [provider]  amLODipine (NORVASC) 10 MG tablet TAKE ONE (1) TABLET EACH DAY 11/17/22   Dettinger, Elige Radon, MD  atorvastatin (LIPITOR) 80 MG tablet TAKE 1/2 TABLET DAILY AT 6PM 11/17/22   Dettinger, Elige Radon, MD  beta carotene 41324 UNIT capsule Take 25,000 Units by mouth daily.    [provider]  Blood Glucose Calibration (OT ULTRA/FASTTK CNTRL SOLN) SOLN USE WITH METER AS DIRECTED DX E11.9 02/16/21   Dettinger, Elige Radon, MD  blood glucose meter kit and supplies Dispense based on patient and insurance preference. Use up to four times daily as directed. (FOR ICD-10 E10.9, E11.9). 10/05/17   Ernestina Penna, MD  busPIRone (BUSPAR) 15 MG tablet 2 qam   2  qhs 08/03/23   Archer Asa, MD  chlorthalidone (HYGROTON) 25 MG tablet Take 0.5 tablets (12.5 mg total) by mouth daily. 11/17/22   Dettinger, Elige Radon, MD  Cholecalciferol (VITAMIN D) 2000 UNITS CAPS Take 1 capsule by mouth daily.    [provider]  FeFum-FePo-FA-B Cmp-C-Zn-Mn-Cu (SE-TAN PLUS) 162-115.2-1 MG CAPS TAKE ONE (1) CAPSULE BY MOUTH 2 TIMES DAILY 07/11/23   Dettinger, Elige Radon, MD  fish oil-omega-3 fatty acids 1000 MG capsule Take 1 g by mouth 2 (two) times daily.     [provider]  glucose blood (ONETOUCH ULTRA) test strip TEST BLOOD SUGARS UP TO 4 TIMES DAILY DX E11.9 08/03/22   Dettinger, Elige Radon, MD  HYDROmorphone (DILAUDID) 2 MG tablet Take 1 tablet (2 mg total) by mouth every 4 (four) hours as needed for severe pain (pain score 7-10). Patient not taking: Reported on 08/08/2023 08/08/23   Mechele Claude, MD  ibuprofen (ADVIL) 600 MG tablet Take 600 mg by mouth every 6 (six) hours as needed. 05/23/23   [provider]  LORazepam (ATIVAN) 0.5 MG tablet 2  qam  1  qhs   1  PRN 05/03/23   Plovsky, Earvin Hansen, MD  losartan (COZAAR) 100 MG tablet Take 1 tablet (100 mg total) by mouth daily.  11/17/22   Dettinger, Elige Radon, MD  metFORMIN (GLUCOPHAGE) 500 MG tablet Take 2 tablets (1,000 mg total) by mouth 2 (two) times daily with a meal. 11/17/22   Dettinger, Elige Radon, MD  mirtazapine (REMERON SOL-TAB) 15 MG disintegrating tablet Take one 15 mg tablet each morning. 08/03/23   Plovsky, Earvin Hansen,  MD  mirtazapine (REMERON) 30 MG tablet 1  qhs 08/03/23   Plovsky, Earvin Hansen, MD  Multiple Vitamins-Minerals (CENTRUM SILVER PO) Take 1 tablet by mouth daily.     [provider]  omeprazole (PRILOSEC) 20 MG capsule Take 1 capsule (20 mg total) by mouth daily. 11/17/22   Dettinger, Elige Radon, MD  Testosterone 20.25 MG/ACT (1.62%) GEL 1 pump topically to each shoulder (2.5gm) qam 09/30/22   Bjorn Pippin, MD  tirzepatide [facility]) 2.5 MG/0.5ML Pen Inject 2.5 mg into the skin once a week. 05/30/23   Dettinger, Elige Radon, MD  venlafaxine XR (EFFEXOR XR) 150 MG 24 hr capsule Take 2 capsules (300 mg total) with breakfast. 08/03/23   Plovsky, Earvin Hansen, MD  febuxostat (ULORIC) 40 MG tablet Take 80 mg by mouth daily.  10/13/11  [provider]  simvastatin (ZOCOR) 80 MG tablet Take 80 mg by mouth at bedtime.  07/03/18  [provider]    Physical Exam:  Vitals:   08/08/23 1400 08/08/23 1710  BP: 122/69 (!) 121/107  Pulse: 91 95  Resp: (!) 22 (!) 28  Temp:  98.1 F (36.7 C)  SpO2: 97% 92%    Awake alert coherent EOMI NCAT no focal deficit looks about stated age thickening Mallampati 4 on oxygen Neck soft supple S1-S2 no wheeze rales rhonchi Point tenderness to the left side of chest Abdomen soft no rebound no guarding Cannot raise his left hand because of pain no restriction otherwise able to squeeze fingers No lower extremity edema  I have personally reviewed following labs and imaging studies   Active Problems:   * No active hospital problems. *   Assessment/Plan Left-sided rib fractures  Pain controlled predominantly with multimodal therapies high-dose Tylenol 1000 3 times  daily for short period of time and then will use oxycodone 5-10 every 6 as needed  Pain not controlled by this regimen we will add Naprosyn for breakthrough 550 twice daily  Lastly will try IV morphine 1 to 2 mg every 3 as needed and will place Lidoderm patch over the area to see if this helps  Hopeful that anesthesia can provide some pain control for him with a nerve block although I am not sure if this can be done inpatient without an overt surgery  Previous GI bleed  Careful with Naprosyn will add Protonix 40 daily to avoid the risk of bleeding  DM TY 2-seems moderately controlled  Last A1c 7.2 would defer to primary care--readd metformin 1000 twice daily  HTN will resume home amlodipine at 10 mg dosing, chlorthalidone 12.5 and losartan 100  Resistant depression  Resumed on home meds of Remeron 15 a.m. 30 p.m. Effexor 300 a.m., Ativan 0.5 twice daily, BuSpar 15 twice daily  Watch for sedation  BMI 30 class I obesity  Follow-up  Prior gout  No longer on Uloric  Severity of Illness: The appropriate patient status for this patient is OBSERVATION. Observation status is judged to be reasonable and necessary in order to provide the required intensity of service to ensure the patient's safety. The patient's presenting symptoms, physical exam findings, and initial radiographic and laboratory data in the context of their medical condition is felt to place them at decreased risk for further clinical deterioration. Furthermore, it is anticipated that the patient will be medically stable for discharge from the hospital within 2 midnights of admission.    Family Communication: Discussed with son at the bedside  DVT ppx: Lovenox Consults called & Whom: None  Time spent:  25 minutes  Mahala Menghini, MD Cordelia Poche my NP partners at night for Care related issues] [facility] --Via amion app OR , [email or web address]; password [facility]  08/08/2023, 5:49 PM

## 2023-08-08 NOTE — Hospital Course (Signed)
 Marland Kitchen

## 2023-08-09 DIAGNOSIS — R0902 Hypoxemia: Secondary | ICD-10-CM | POA: Diagnosis not present

## 2023-08-09 LAB — COMPREHENSIVE METABOLIC PANEL
ALT: 18 U/L (ref 0–44)
AST: 52 U/L — ABNORMAL HIGH (ref 15–41)
Albumin: 3.5 g/dL (ref 3.5–5.0)
Alkaline Phosphatase: 50 U/L (ref 38–126)
Anion gap: 9 (ref 5–15)
BUN: 26 mg/dL — ABNORMAL HIGH (ref 8–23)
CO2: 26 mmol/L (ref 22–32)
Calcium: 8.8 mg/dL — ABNORMAL LOW (ref 8.9–10.3)
Chloride: 100 mmol/L (ref 98–111)
Creatinine, Ser: 0.89 mg/dL (ref 0.61–1.24)
GFR, Estimated: >60 mL/min (ref >60)
Glucose, Bld: 155 mg/dL — ABNORMAL HIGH (ref 70–99)
Potassium: 3.4 mmol/L — ABNORMAL LOW (ref 3.5–5.1)
Sodium: 135 mmol/L (ref 135–145)
Total Bilirubin: 0.9 mg/dL (ref 0.0–1.2)
Total Protein: 6.4 g/dL — ABNORMAL LOW (ref 6.5–8.1)

## 2023-08-09 LAB — CBC
HCT: 30.9 % — ABNORMAL LOW (ref 39.0–52.0)
Hemoglobin: 10.7 g/dL — ABNORMAL LOW (ref 13.0–17.0)
MCH: 33.6 pg (ref 26.0–34.0)
MCHC: 34.6 g/dL (ref 30.0–36.0)
MCV: 97.2 fL (ref 80.0–100.0)
Platelets: 197 10*3/uL (ref 150–400)
RBC: 3.18 MIL/uL — ABNORMAL LOW (ref 4.22–5.81)
RDW: 12.4 % (ref 11.5–15.5)
WBC: 8.4 10*3/uL (ref 4.0–10.5)
nRBC: 0 % (ref 0.0–0.2)

## 2023-08-09 NOTE — Plan of Care (Signed)

## 2023-08-09 NOTE — Progress Notes (Signed)
[facility]   NOTE TREVEL NICCOLI OZD:[id number]  DOB: Aug 22, 1945  DOA: 08/08/2023  PCP: Dettinger, Elige Radon, MD  08/09/2023,8:49 AM   LOS: 1 day      Code Status: full  From:  home     Current Dispo: unclear   78 year old white male Previous bladder/prostate cancer status post prostatectomy Gleason stage VI 2005 [facility]-testosterone DM TY 2 HTN HLD Depression (previously hospitalized 1989) recently seen by psychiatrist 1/15-Effexor increased to max dose 300 Remeron 15 morning 30 night Ativan BuSpar   Patient presented from home 1/20 after having a fall on 1/18 and having rib fractures-having failed outpatient management of pain control for rib fractures he was admitted  Events 1/20 admi    Plan  Left-sided rib fractures Continue high-dose Tylenol 1000 3 times daily, use oxycodone 5-10 every 6 as needed---Naprosyn for breakthrough 550 twice daily            Lidoderm patch on board  Has not needed IV morphine but has not moved around much so we will see how he does with therapy            Unclear if candidate for nerve block  Previous GI bleed             Careful with Naprosyn will add Protonix 40 daily to avoid the risk of bleeding   DM TY 2-seems moderately controlled             Last A1c 7.2 would defer to primary care--readded metformin 1000 twice daily  CBGs ranging 1 55-2 31 so we will add sliding scale   HTN-controlled  Continues on amlodipine at 10 mg dosing, chlorthalidone 12.5 and losartan 100   Resistant depression             Resumed on home meds of Remeron 15 a.m. 30 p.m. Effexor 300 a.m., Ativan 0.5 twice daily, BuSpar 15 twice daily             Watch for sedation   BMI 30 class I obesity             Follow-up   Prior gout             No longer on Uloric  DVT prophylaxis: Heparin  Status is: Inpatient Remains inpatient appropriate because:   Requires mobilization with therapy services to see what may be required from a functional perspective-if  unable to move around I have cautioned him he may need to go to skilled      Subjective: Slept great no fever no chills No nausea no vomiting No pain but has now moved around  Objective + exam Vitals:   08/08/23 1710 08/08/23 1845 08/08/23 2027 08/09/23 0521  BP: (!) 121/107 (!) 141/73 (!) 152/72 (!) 152/77  Pulse: 95 94 92 89  Resp: (!) 28 (!) 24 19 18   Temp: 98.1 F (36.7 C) 98 F (36.7 C) 98 F (36.7 C) 98 F (36.7 C)  TempSrc: Oral Oral  Oral  SpO2: 92% 99% 94% 91%  Weight:      Height:       Filed Weights   08/08/23 1354  Weight: 78 kg    Examination: EOMI NCAT no focal deficit Chest clear ROM intact CTAB no added sound Able to raise his left hand now No lower extremity edema  Data Reviewed: reviewed   CBC    Component Value Date/Time   WBC 8.4 08/09/2023 0433   RBC 3.18 (L) 08/09/2023 2595  HGB 10.7 (L) 08/09/2023 0433   HGB 12.7 (L) 05/30/2023 1024   HCT 30.9 (L) 08/09/2023 0433   HCT 38.3 05/30/2023 1024   PLT 197 08/09/2023 0433   PLT 322 05/30/2023 1024   MCV 97.2 08/09/2023 0433   MCV 100 (H) 05/30/2023 1024   MCH 33.6 08/09/2023 0433   MCHC 34.6 08/09/2023 0433   RDW 12.4 08/09/2023 0433   RDW 12.2 05/30/2023 1024   LYMPHSABS 1.4 08/08/2023 1545   LYMPHSABS 1.9 05/30/2023 1024   MONOABS 1.2 (H) 08/08/2023 1545   EOSABS 0.1 08/08/2023 1545   EOSABS 0.2 05/30/2023 1024   BASOSABS 0.1 08/08/2023 1545   BASOSABS 0.1 05/30/2023 1024      Latest Ref Rng & Units 08/09/2023    4:33 AM 08/08/2023    3:45 PM 05/30/2023   10:24 AM  CMP  Glucose 70 - 99 mg/dL 409  811  914   BUN 8 - 23 mg/dL 26  29  28    Creatinine 0.61 - 1.24 mg/dL 7.82  9.56  2.13   Sodium 135 - 145 mmol/L 135  134  139   Potassium 3.5 - 5.1 mmol/L 3.4  3.8  4.8   Chloride 98 - 111 mmol/L 100  99  99   CO2 22 - 32 mmol/L 26  26  25    Calcium 8.9 - 10.3 mg/dL 8.8  8.7  9.6   Total Protein 6.5 - 8.1 g/dL 6.4   6.6   Total Bilirubin 0.0 - 1.2 mg/dL 0.9   0.4   Alkaline  Phos 38 - 126 U/L 50   90   AST 15 - 41 U/L 52   60   ALT 0 - 44 U/L 18   19     Scheduled Meds:  acetaminophen  1,000 mg Oral Q8H   amLODipine  10 mg Oral Daily   busPIRone  15 mg Oral BID   chlorthalidone  12.5 mg Oral Daily   heparin  5,000 Units Subcutaneous Q8H   lidocaine  2 patch Transdermal Q24H   LORazepam  0.5 mg Oral BID   losartan  100 mg Oral Daily   metFORMIN  1,000 mg Oral BID WC   mirtazapine  15 mg Oral Daily   mirtazapine  30 mg Oral QHS   pantoprazole  40 mg Oral BID   venlafaxine XR  300 mg Oral Q breakfast   Continuous Infusions:  Time 43  Rhetta Mura, MD  [facility]

## 2023-08-09 NOTE — Evaluation (Signed)
Physical Therapy Evaluation Patient Details Name: George Wang MRN: [id number] DOB: 1945/12/01 Today's Date: 08/09/2023  History of Present Illness  78 year old male comes to Ed 08/08/23 with  uncontrolled pain from left rib fractures 3-7 which occurred 08/06/23, He was seen in ED, returned home on pain medication. PMH: prostate cancer,  anxiety/depression, anemia, PVCs  Clinical Impression  Pt admitted with above diagnosis.  Pt currently with functional limitations due to the deficits listed below (see PT Problem List). Pt will benefit from acute skilled PT to increase their independence and safety with mobility to allow discharge.     The patient reporting  significant right side pain with mobility, Patient reports feeling of not catching his breath. Patient recently given Iv paIN MEDS.   Patient slowly able to  move self to sitting with HOB raised and use of rail. Mod support to assist back into bed. Patient reports sleeping in recliner.  Patient ambulated a 120' using Rw and CGA. Patient should progress to return home with family support and HHPT as pain is controlled and diminishes.      If plan is discharge home, recommend the following: A little help with walking and/or transfers;Assist for transportation;Assistance with cooking/housework;Help with stairs or ramp for entrance   Can travel by private vehicle        Equipment Recommendations None recommended by PT  Recommendations for Other Services  OT consult    Functional Status Assessment Patient has had a recent decline in their functional status and demonstrates the ability to make significant improvements in function in a reasonable and predictable amount of time.     Precautions / Restrictions Precautions Precautions: Fall Precaution Comments: left  rib fxs Restrictions Weight Bearing Restrictions Per Provider Order: No      Mobility  Bed Mobility Overal bed mobility: Needs Assistance Bed Mobility: Supine to Sit,  Sit to Supine     Supine to sit: HOB elevated, Used rails Sit to supine: Max assist, HOB elevated, Used rails   General bed mobility comments: extra time  with intermittent movements, pillow to left side,use of right  rail  and HOB at max position, slowly able to move legs over bed edge and slowly raise shoulders to sitting. Patient did not  want therapist  to assist. HOB max position , use of rail, assist with lifting feet/legs  onto bed very slowly    Transfers Overall transfer level: Needs assistance Equipment used: Rolling walker (2 wheels) Transfers: Sit to/from Stand Sit to Stand: Min assist           General transfer comment: slow to rise to standing    Ambulation/Gait    Ambulated x 120' with RW, several stops to breathe.              Stairs            Wheelchair Mobility     Tilt Bed    Modified Rankin (Stroke Patients Only)       Balance Overall balance assessment: Needs assistance, History of Falls Sitting-balance support: Feet supported, Single extremity supported Sitting balance-Leahy Scale: Fair     Standing balance support: Bilateral upper extremity supported, Reliant on assistive device for balance, During functional activity Standing balance-Leahy Scale: Fair                               Pertinent Vitals/Pain Pain Assessment Pain Assessment: 0-10 Pain Score: 10-Worst pain ever Pain  Location: left side /axilla Pain Descriptors / Indicators: Grimacing, Cramping, Crushing, Jabbing, Moaning, Discomfort, Guarding Pain Intervention(s): Limited activity within patient's tolerance, Monitored during session, Patient requesting pain meds-RN notified, Repositioned    Home Living Family/patient expects to be discharged to:: Private residence Living Arrangements: Spouse/significant other;Children Available Help at Discharge: Family;Available 24 hours/day Type of Home: House Home Access: Stairs to enter Entrance Stairs-Rails:  None Entrance Stairs-Number of Steps: 2   Home Layout: One level Home Equipment: Lift chair      Prior Function Prior Level of Function : Independent/Modified Independent;Driving;Working/employed                     Extremity/Trunk Assessment   Upper Extremity Assessment Upper Extremity Assessment: Defer to OT evaluation;LUE deficits/detail LUE Deficits / Details: limited active elevation due to pain    Lower Extremity Assessment Lower Extremity Assessment: Overall WFL for tasks assessed    Cervical / Trunk Assessment Cervical / Trunk Assessment: Other exceptions Cervical / Trunk Exceptions: guarded with LUE held near body  Communication   Communication Communication: No apparent difficulties  Cognition Arousal: Alert Behavior During Therapy: WFL for tasks assessed/performed Overall Cognitive Status: Within Functional Limits for tasks assessed                                          General Comments      Exercises     Assessment/Plan    PT Assessment Patient needs continued PT services  PT Problem List Decreased strength;Decreased knowledge of precautions;Pain;Decreased range of motion;Decreased mobility;Decreased activity tolerance       PT Treatment Interventions DME instruction;Therapeutic activities;Gait training;Therapeutic exercise;Patient/family education;Functional mobility training    PT Goals (Current goals can be found in the Care Plan section)  Acute Rehab PT Goals Patient Stated Goal: go home, drive PT Goal Formulation: With patient/family Time For Goal Achievement: 08/23/23 Potential to Achieve Goals: Good    Frequency Min 1X/week     Co-evaluation               AM-PAC PT "6 Clicks" Mobility  Outcome Measure Help needed turning from your back to your side while in a flat bed without using bedrails?: A Lot Help needed moving from lying on your back to sitting on the side of a flat bed without using bedrails?:  A Little Help needed moving to and from a bed to a chair (including a wheelchair)?: A Lot Help needed standing up from a chair using your arms (e.g., wheelchair or bedside chair)?: A Little Help needed to walk in hospital room?: A Little Help needed climbing 3-5 steps with a railing? : A Lot 6 Click Score: 15    End of Session   Activity Tolerance: Patient limited by pain;Patient tolerated treatment well Patient left: in bed;with call bell/phone within reach Nurse Communication: Mobility status PT Visit Diagnosis: Unsteadiness on feet (R26.81);Difficulty in walking, not elsewhere classified (R26.2);Pain    Time: 8657-8469 PT Time Calculation (min) (ACUTE ONLY): 30 min   Charges:   PT Evaluation $PT Eval Low Complexity: 1 Low PT Treatments $Gait Training: 8-22 mins PT General Charges $$ ACUTE PT VISIT: 1 Visit         Blanchard Kelch PT Acute Rehabilitation Services Office [phone] Weekend pager-[phone]   Rada Hay 08/09/2023, 4:11 PM

## 2023-08-10 DIAGNOSIS — R0902 Hypoxemia: Secondary | ICD-10-CM | POA: Diagnosis not present

## 2023-08-10 LAB — GLUCOSE, CAPILLARY: Glucose-Capillary: 123 mg/dL — ABNORMAL HIGH (ref 70–99)

## 2023-08-10 NOTE — Plan of Care (Signed)

## 2023-08-10 NOTE — Progress Notes (Signed)
PROGRESS NOTE    George Wang  OZH:[id number] DOB: 1945-09-22 DOA: 08/08/2023 PCP: Dettinger, Elige Radon, MD  Brief Narrative: 78 year old male lives at home with his wife had a mechanical fall on 08/06/2023 when he presented to [facility], ER was found to have acute fracture of the left fifth through the seventh rib posteriorly with no pneumothorax he was discharged on oxycodone.  He went to see his primary doctor on the 28th of this month since oxycodone did not help, unable to manage pain at home.  Wife had a hip repair and cannot take care of him.  Came to the ER with intractable pain and admitted for the same.  His other medical history includes type 2 diabetes hypertension hyperlipidemia depression prostate cancer/bladder CA.   Assessment & Plan:   Principal Problem:   Hypoxia  #1 acute left rib fracture status post fall from mechanical fall-still complains of severe pain was not able to participate in therapy today due to 8 out of 10 pain Continue with standing dose of Tylenol 1 g 3 times daily Oxycodone 5 to 10 mg every 6 as needed Lidoderm patch Will add diclofenac ointment Encouraged to use incentive spirometer  #2 type 2 diabetes-CBG (last 3)  Recent Labs    08/10/23 0830  GLUCAP 123*   On metformin 1 g twice daily with sliding scale insulin  #3 hypertension on Norvasc chlorthalidone and losartan   #4 depression on Remeron Effexor Ativan and BuSpar  #5 hypokalemia potassium 3.4 repleted we will recheck labs in the morning check mag level  Estimated body mass index is 30.46 kg/m as calculated from the following:   Height as of this encounter: 5\' 3"  (1.6 m).   Weight as of this encounter: 78 kg.  DVT prophylaxis: Heparin Code Status: Full code Family Communication: None at bedside  disposition Plan:  Status is: Inpatient Remains inpatient appropriate because: Acute illness acute pain acute rib fractures   Consultants:  None  Procedures: None Antimicrobials:  None  Subjective: Complains of 8 out of 10 pain Trying to use incentive spirometer  Objective: Vitals:   08/10/23 0442 08/10/23 0837 08/10/23 0857 08/10/23 1220  BP: 128/75 130/66 130/66 (!) 144/65  Pulse: 76 82  92  Resp: 17 20  18   Temp: (!) 97.5 F (36.4 C) (!) 97.2 F (36.2 C)  97.7 F (36.5 C)  TempSrc:  Oral  Oral  SpO2: 91% 92%  93%  Weight:      Height:        Intake/Output Summary (Last 24 hours) at 08/10/2023 1446 Last data filed at 08/10/2023 0930 Gross per 24 hour  Intake 240 ml  Output 225 ml  Net 15 ml   Filed Weights   08/08/23 1354  Weight: 78 kg    Examination:  General exam: Appears in mild distress due to pain Respiratory system: Clear to auscultation. Respiratory effort normal. Cardiovascular system: Regular  gastrointestinal system: Abdomen is nondistended, soft and nontender. No organomegaly or masses felt. Normal bowel sounds heard. Central nervous system: Alert and oriented. No focal neurological deficits. Extremities: Trace edema bilaterally  Data Reviewed: I have personally reviewed following labs and imaging studies  CBC: Recent Labs  Lab 08/08/23 1545 08/09/23 0433  WBC 11.8* 8.4  NEUTROABS 9.0*  --   HGB 11.5* 10.7*  HCT 32.6* 30.9*  MCV 97.0 97.2  PLT 217 197   Basic Metabolic Panel: Recent Labs  Lab 08/08/23 1545 08/09/23 0433  NA 134* 135  K  3.8 3.4*  CL 99 100  CO2 26 26  GLUCOSE 231* 155*  BUN 29* 26*  CREATININE 1.06 0.89  CALCIUM 8.7* 8.8*   GFR: Estimated Creatinine Clearance: 62.2 mL/min (by C-G formula based on SCr of 0.89 mg/dL). Liver Function Tests: Recent Labs  Lab 08/09/23 0433  AST 52*  ALT 18  ALKPHOS 50  BILITOT 0.9  PROT 6.4*  ALBUMIN 3.5   No results for input(s): "LIPASE", "AMYLASE" in the last 168 hours. No results for input(s): "AMMONIA" in the last 168 hours. Coagulation Profile: No results for input(s): "INR", "PROTIME" in the last 168 hours. Cardiac Enzymes: No results for  input(s): "CKTOTAL", "CKMB", "CKMBINDEX", "TROPONINI" in the last 168 hours. BNP (last 3 results) No results for input(s): "PROBNP" in the last 8760 hours. HbA1C: No results for input(s): "HGBA1C" in the last 72 hours. CBG: Recent Labs  Lab 08/10/23 0830  GLUCAP 123*   Lipid Profile: No results for input(s): "CHOL", "HDL", "LDLCALC", "TRIG", "CHOLHDL", "LDLDIRECT" in the last 72 hours. Thyroid Function Tests: No results for input(s): "TSH", "T4TOTAL", "FREET4", "T3FREE", "THYROIDAB" in the last 72 hours. Anemia Panel: No results for input(s): "VITAMINB12", "FOLATE", "FERRITIN", "TIBC", "IRON", "RETICCTPCT" in the last 72 hours. Sepsis Labs: No results for input(s): "PROCALCITON", "LATICACIDVEN" in the last 168 hours.  No results found for this or any previous visit (from the past 240 hours).   Radiology Studies: No results found.   Scheduled Meds:  acetaminophen  1,000 mg Oral Q8H   amLODipine  10 mg Oral Daily   busPIRone  15 mg Oral BID   chlorthalidone  12.5 mg Oral Daily   heparin  5,000 Units Subcutaneous Q8H   lidocaine  2 patch Transdermal Q24H   LORazepam  0.5 mg Oral BID   losartan  100 mg Oral Daily   metFORMIN  1,000 mg Oral BID WC   mirtazapine  15 mg Oral Daily   mirtazapine  30 mg Oral QHS   pantoprazole  40 mg Oral BID   venlafaxine XR  300 mg Oral Q breakfast   Continuous Infusions:   LOS: 2 days    Time spent: 36 min  Alwyn Ren, MD  08/10/2023, 2:46 PM

## 2023-08-10 NOTE — Progress Notes (Signed)
Physical Therapy Treatment Patient Details Name: George Wang MRN: [id number] DOB: Jan 15, 1946 Today's Date: 08/10/2023   History of Present Illness 78 year old male comes to Ed 08/08/23 with  uncontrolled pain from left rib fractures 3-7 which occurred 08/06/23, He was seen in ED, returned home on pain medication. PMH: prostate cancer,  anxiety/depression, anemia, PVCs    PT Comments  Patient sitting upright on edge of recliner, reports left side pain 8/10, requested  pain medication.  Patient continue to move very slowly and very guarded. Patient performs majority of moving, using bed rail and HOB elevated feature. Patient does report that he has an adjustable bed. Will need practice without bedrail, also has a lift  chair. Recommend a rollator  for  ease of turns and for option for sitting down as needed. Continue PT in AM.      If plan is discharge home, recommend the following: A little help with walking and/or transfers;Assist for transportation;Assistance with cooking/housework;Help with stairs or ramp for entrance   Can travel by private vehicle        Equipment Recommendations  Rollator (4 wheels)    Recommendations for Other Services       Precautions / Restrictions Precautions Precautions: Fall Precaution Comments: left  rib fxs Restrictions Weight Bearing Restrictions Per Provider Order: No     Mobility  Bed Mobility Overal bed mobility: Needs Assistance Bed Mobility: Sit to Supine       Sit to supine: Mod assist, HOB elevated   General bed mobility comments: extra time to sit down and scoot back, reliant  on rail on bed to support, slowly  assisted legs onto bed.    Transfers Overall transfer level: Needs assistance Equipment used: Rolling walker (2 wheels) Transfers: Sit to/from Stand Sit to Stand: Contact guard assist           General transfer comment: slow to rise to standing, slow to place the LUE onto the  RW,     Ambulation/Gait Ambulation/Gait assistance: Min assist Gait Distance (Feet): 120 Feet Assistive device: Rolling walker (2 wheels) Gait Pattern/deviations: Step-to pattern, Step-through pattern Gait velocity: decr     General Gait Details: gait very slow and guarded through distance and turning   Stairs             Wheelchair Mobility     Tilt Bed    Modified Rankin (Stroke Patients Only)       Balance Overall balance assessment: Needs assistance, History of Falls Sitting-balance support: Feet supported, Single extremity supported Sitting balance-Leahy Scale: Fair     Standing balance support: Bilateral upper extremity supported, Reliant on assistive device for balance, During functional activity Standing balance-Leahy Scale: Fair                              Cognition Arousal: Alert Behavior During Therapy: WFL for tasks assessed/performed Overall Cognitive Status: Within Functional Limits for tasks assessed                                          Exercises      General Comments        Pertinent Vitals/Pain Pain Assessment Pain Score: 8  Pain Location: left side /axilla Pain Descriptors / Indicators: Grimacing, Cramping, Crushing, Jabbing, Moaning, Discomfort, Guarding Pain Intervention(s): Monitored during session, Patient requesting pain meds-RN notified,  Repositioned    Home Living                          Prior Function            PT Goals (current goals can now be found in the care plan section) Progress towards PT goals: Progressing toward goals    Frequency    Min 1X/week      PT Plan      Co-evaluation              AM-PAC PT "6 Clicks" Mobility   Outcome Measure  Help needed turning from your back to your side while in a flat bed without using bedrails?: A Lot Help needed moving from lying on your back to sitting on the side of a flat bed without using bedrails?: A  Little Help needed moving to and from a bed to a chair (including a wheelchair)?: A Little Help needed standing up from a chair using your arms (e.g., wheelchair or bedside chair)?: A Little Help needed to walk in hospital room?: A Little Help needed climbing 3-5 steps with a railing? : A Lot 6 Click Score: 16    End of Session   Activity Tolerance: Patient tolerated treatment well Patient left: in bed;with call bell/phone within reach;with family/visitor present Nurse Communication: Mobility status PT Visit Diagnosis: Unsteadiness on feet (R26.81);Difficulty in walking, not elsewhere classified (R26.2);Pain     Time: 2956-2130 PT Time Calculation (min) (ACUTE ONLY): 38 min  Charges:    $Gait Training: 23-37 mins $Self Care/Home Management: 8-22 PT General Charges $$ ACUTE PT VISIT: 1 Visit                     Blanchard Kelch PT Acute Rehabilitation Services Office [phone] Weekend pager-[phone]    Rada Hay 08/10/2023, 1:42 PM

## 2023-08-10 NOTE — TOC Initial Note (Signed)
Transition of Care [facility]) - Initial/Assessment Note    Patient Details  Name: George Wang MRN: [id number] Date of Birth: [birth year]  Transition of [facility]) CM/SW Contact:    Larrie Kass, LCSW Phone Number: 08/10/2023, 2:16 PM  Clinical Narrative:                 CSW spoke with pt regarding rec for home health services. Pt has agreed and has no preference in [facility] agency. CSW spoke with pt about rec DME. Pt stated he would like a rolling walker, pt has no preference in DME company. Referral was sent to [facility] for walker to be delivered to pt's room before d/c. Pt denies transportation needs , stated he has someone to pick him up upon d/c.   [facility] accepted pt for HHPT/OT. No further TOC needs TOC sign off.    Expected Discharge Plan: Home w Home Health Services Barriers to Discharge: Continued Medical Work up   Patient Goals and CMS Choice Patient states their goals for this hospitalization and ongoing recovery are:: retrun home with home health          Expected Discharge Plan and Services       Living arrangements for the past 2 months: Single Family Home                 DME Arranged: Walker rolling DME Agency: [employer] Date DME Agency Contacted: 08/10/23 Time DME Agency Contacted: 1414 Representative spoke with at DME Agency: jermaine HH Arranged: OT, PT HH Agency: [facility] Date [facility] [employer] Agency Contacted: 08/10/23 Time HH Agency Contacted: 1415 Representative spoke with at [facility] Agency: Amy  Prior Living Arrangements/Services Living arrangements for the past 2 months: Single Family Home Lives with:: Self, Spouse Patient language and need for interpreter reviewed:: Yes Do you feel safe going back to the place where you live?: Yes      Need for Family Participation in Patient Care: No (Comment) Care giver support system in place?: No (comment)   Criminal Activity/Legal Involvement Pertinent to Current Situation/Hospitalization: No -  Comment as needed  Activities of Daily Living   ADL Screening (condition at time of admission) Independently performs ADLs?: Yes (appropriate for developmental age) Is the patient deaf or have difficulty hearing?: No Does the patient have difficulty seeing, even when wearing glasses/contacts?: No Does the patient have difficulty concentrating, remembering, or making decisions?: No  Permission Sought/Granted                  Emotional Assessment Appearance:: Appears stated age Attitude/Demeanor/Rapport: Gracious, Engaged Affect (typically observed): Accepting Orientation: : Oriented to Self, Oriented to Place, Oriented to  Time, Oriented to Situation   Psych Involvement: No (comment)  Admission diagnosis:  Hypoxia [R09.02] Closed fracture of multiple ribs of left side with routine healing, subsequent encounter [S22.42XD] Patient Active Problem List   Diagnosis Date Noted   Traumatic closed displaced fracture of rib on left side 08/08/2023   Hypoxia 08/08/2023   Elevated ALT measurement 08/18/2022   Murmur, cardiac 04/05/2018   Thoracic aortic atherosclerosis (HCC) 05/09/2017   Morbid obesity (HCC) 11/21/2015   Hypertension associated with diabetes (HCC) 02/28/2013   Hyperlipidemia associated with type 2 diabetes mellitus (HCC) 02/28/2013   Type 2 diabetes mellitus (HCC) 02/28/2013   Vitamin D deficiency 02/28/2013   Primary male hypogonadism 08/01/2012   Overweight 10/13/2011   Frequent PVCs 10/13/2011   Iron deficiency anemia 06/27/2008   GOUT 04/25/2008   Anxiety state  04/25/2008   Depression 04/25/2008   Diverticulosis of colon 04/25/2008   Irritable bowel syndrome 04/25/2008   PROSTATE CANCER, HX OF 04/25/2008   PCP:  Dettinger, Elige Radon, MD Pharmacy:   [facility] [place], [place] - [address] [place] [address] Phone: [phone] Fax: [phone]  [facility] [phone] - [place], [place] - [address] [facility]  [place] [address] [place] [address] [place] [address] Phone: [phone] Fax: [phone]  [id number] - [place], [place] - [address] [place] [address] Phone: [phone] Fax: [phone]  [facility] - [place], [place] - [address] [place] [address] Phone: [phone] Fax: [phone]     Social Drivers of Health (SDOH) Social History: SDOH Screenings   Food Insecurity: No Food Insecurity (08/09/2023)  Housing: Low Risk  (08/09/2023)  Transportation Needs: No Transportation Needs (08/09/2023)  Utilities: Not At Risk (08/09/2023)  Alcohol Screen: Low Risk  (10/20/2022)  Depression (PHQ2-9): Medium Risk (08/08/2023)  Financial Resource Strain: Low Risk  (10/20/2022)  Physical Activity: Inactive (10/20/2022)  Social Connections: Socially Integrated (08/09/2023)  Stress: No Stress Concern Present (10/20/2022)  Tobacco Use: Medium Risk (08/08/2023)   SDOH Interventions:     Readmission Risk Interventions     No data to display

## 2023-08-10 NOTE — Evaluation (Signed)
Occupational Therapy Evaluation Patient Details Name: George Wang MRN: [id number] DOB: 28-Feb-1946 Today's Date: 08/10/2023   History of Present Illness 78 year old male comes to Ed 08/08/23 with  uncontrolled pain from left rib fractures 3-7 which occurred 08/06/23, He was seen in ED, returned home on pain medication. PMH: prostate cancer,  anxiety/depression, anemia, PVCs   Clinical Impression   Pt presents with decline in function and safety with ADLs and ADL mobility with impaired balance and endurance. Limited by pain at rib fx areas. PTA pt lived at home with his wife and was Ind with ADLs/IADLs, mobility, was driving and working. Pt currently requires min A to sit EOB, mod A with LB ADLs and CGA with mobility using RW. Pt would benefit from acute OT services to address impairments to maximize level of function and safety      If plan is discharge home, recommend the following: A little help with bathing/dressing/bathroom;A little help with walking and/or transfers;Assist for transportation;Help with stairs or ramp for entrance    Functional Status Assessment  Patient has had a recent decline in their functional status and demonstrates the ability to make significant improvements in function in a reasonable and predictable amount of time.  Equipment Recommendations  Tub/shower bench;Other (comment) Nature conservation officer, reacher)    Recommendations for Other Services       Precautions / Restrictions Precautions Precautions: Fall Precaution Comments: left  rib fxs Restrictions Weight Bearing Restrictions Per Provider Order: No      Mobility Bed Mobility Overal bed mobility: Needs Assistance Bed Mobility: Supine to Sit     Supine to sit: HOB elevated, Used rails, Min assist     General bed mobility comments: slow, guarded pace of movement, less pain    Transfers Overall transfer level: Needs assistance Equipment used: Rolling walker (2 wheels) Transfers: Sit to/from Stand Sit  to Stand: Contact guard assist           General transfer comment: slow, guarded pace of movement, less pain      Balance   Sitting-balance support: Feet supported, Single extremity supported Sitting balance-Leahy Scale: Fair     Standing balance support: Bilateral upper extremity supported, Reliant on assistive device for balance, During functional activity Standing balance-Leahy Scale: Fair                             ADL either performed or assessed with clinical judgement   ADL Overall ADL's : Needs assistance/impaired Eating/Feeding: Independent   Grooming: Wash/dry hands;Wash/dry face;Contact guard assist;Standing   Upper Body Bathing: Set up;Supervision/ safety;Sitting   Lower Body Bathing: Moderate assistance   Upper Body Dressing : Set up;Moderate assistance;Sitting   Lower Body Dressing: Moderate assistance   Toilet Transfer: Contact guard assist;Ambulation;Rolling walker (2 wheels);Grab bars   Toileting- Clothing Manipulation and Hygiene: Contact guard assist;Sit to/from stand       Functional mobility during ADLs: Contact guard assist;Rolling walker (2 wheels)       Vision Baseline Vision/History: 1 Wears glasses Ability to See in Adequate Light: 0 Adequate Patient Visual Report: No change from baseline       Perception         Praxis         Pertinent Vitals/Pain Pain Assessment Pain Assessment: 0-10 Pain Score: 6  Pain Location: left side /axilla Pain Descriptors / Indicators: Grimacing, Cramping, Crushing, Jabbing, Moaning, Discomfort, Guarding Pain Intervention(s): Limited activity within patient's tolerance, Monitored during session, Repositioned  Extremity/Trunk Assessment Upper Extremity Assessment Upper Extremity Assessment: Overall WFL for tasks assessed;LUE deficits/detail LUE Deficits / Details: limited active elevation due to pain   Lower Extremity Assessment Lower Extremity Assessment: Defer to PT  evaluation   Cervical / Trunk Assessment Cervical / Trunk Assessment: Other exceptions Cervical / Trunk Exceptions: guarded with LUE held near body   Communication Communication Communication: No apparent difficulties   Cognition Arousal: Alert Behavior During Therapy: WFL for tasks assessed/performed Overall Cognitive Status: Within Functional Limits for tasks assessed                                       General Comments       Exercises     Shoulder Instructions      Home Living Family/patient expects to be discharged to:: Private residence Living Arrangements: Spouse/significant other;Children Available Help at Discharge: Family;Available 24 hours/day Type of Home: House Home Access: Stairs to enter [employer] of Steps: 2 Entrance Stairs-Rails: None Home Layout: One level     Bathroom Shower/Tub: Chief Strategy Officer: Standard     Home Equipment: Lift chair          Prior Functioning/Environment Prior Level of Function : Independent/Modified Independent;Driving;Working/employed                        OT Problem List: Impaired balance (sitting and/or standing);Pain;Decreased activity tolerance;Decreased knowledge of use of DME or AE      OT Treatment/Interventions: Self-care/ADL training;DME and/or AE instruction;Therapeutic activities;Patient/family education    OT Goals(Current goals can be found in the care plan section) Acute Rehab OT Goals Patient Stated Goal: go home OT Goal Formulation: With patient Time For Goal Achievement: 08/24/23 Potential to Achieve Goals: Good ADL Goals Pt Will Perform Grooming: with supervision;with set-up;standing Pt Will Perform Upper Body Bathing: with set-up Pt Will Perform Lower Body Bathing: with min assist;with contact guard assist;with adaptive equipment Pt Will Perform Upper Body Dressing: with set-up Pt Will Perform Lower Body Dressing: with min assist;with  contact guard assist;with adaptive equipment Pt Will Transfer to Toilet: with supervision;with modified independence;ambulating Pt Will Perform Toileting - Clothing Manipulation and hygiene: with supervision;with modified independence;sit to/from stand  OT Frequency: Min 1X/week    Co-evaluation              AM-PAC OT "6 Clicks" Daily Activity     Outcome Measure Help from another person eating meals?: None Help from another person taking care of personal grooming?: A Little Help from another person toileting, which includes using toliet, bedpan, or urinal?: A Little Help from another person bathing (including washing, rinsing, drying)?: A Lot Help from another person to put on and taking off regular upper body clothing?: A Little Help from another person to put on and taking off regular lower body clothing?: A Lot 6 Click Score: 17   End of Session Equipment Utilized During Treatment: Rolling walker (2 wheels);Gait belt  Activity Tolerance: Patient tolerated treatment well Patient left: in chair;with call bell/phone within reach;with chair alarm set  OT Visit Diagnosis: Unsteadiness on feet (R26.81);Other abnormalities of gait and mobility (R26.89);History of falling (Z91.81);Muscle weakness (generalized) (M62.81);Pain Pain - Right/Left: Left Pain - part of body:  (rib fx areas)                Time: 7829-5621 OT Time Calculation (min): 27 min Charges:  OT General Charges $OT Visit: 1 Visit OT Evaluation $OT Eval Low Complexity: 1 Low OT Treatments $Self Care/Home Management : 8-22 mins $Therapeutic Activity: 8-22 mins    Galen Manila 08/10/2023, 2:11 PM

## 2023-08-11 DIAGNOSIS — R0902 Hypoxemia: Secondary | ICD-10-CM | POA: Diagnosis not present

## 2023-08-11 LAB — BASIC METABOLIC PANEL
Anion gap: 12 (ref 5–15)
BUN: 40 mg/dL — ABNORMAL HIGH (ref 8–23)
CO2: 28 mmol/L (ref 22–32)
Calcium: 9 mg/dL (ref 8.9–10.3)
Chloride: 97 mmol/L — ABNORMAL LOW (ref 98–111)
Creatinine, Ser: 0.95 mg/dL (ref 0.61–1.24)
GFR, Estimated: >60 mL/min (ref >60)
Glucose, Bld: 122 mg/dL — ABNORMAL HIGH (ref 70–99)
Potassium: 3.8 mmol/L (ref 3.5–5.1)
Sodium: 137 mmol/L (ref 135–145)

## 2023-08-11 LAB — CBC
HCT: 32.4 % — ABNORMAL LOW (ref 39.0–52.0)
Hemoglobin: 11 g/dL — ABNORMAL LOW (ref 13.0–17.0)
MCH: 33.6 pg (ref 26.0–34.0)
MCHC: 34 g/dL (ref 30.0–36.0)
MCV: 99.1 fL (ref 80.0–100.0)
Platelets: 233 10*3/uL (ref 150–400)
RBC: 3.27 MIL/uL — ABNORMAL LOW (ref 4.22–5.81)
RDW: 12.7 % (ref 11.5–15.5)
WBC: 9 10*3/uL (ref 4.0–10.5)
nRBC: 0 % (ref 0.0–0.2)

## 2023-08-11 LAB — MAGNESIUM: Magnesium: 1.5 mg/dL — ABNORMAL LOW (ref 1.7–2.4)

## 2023-08-11 MED ORDER — ACETAMINOPHEN 325 MG PO TABS: 650.0000 mg | ORAL_TABLET | Freq: Three times a day (TID) | ORAL | Status: AC

## 2023-08-11 MED ORDER — MAGNESIUM SULFATE 2 GM/50ML IV SOLN
2.0000 g | Freq: Once | INTRAVENOUS | Status: AC
Start: 2023-08-11 — End: 2023-08-11
  Administered 2023-08-11: 2 g via INTRAVENOUS
  Filled 2023-08-11: qty 50

## 2023-08-11 MED ORDER — HYDROMORPHONE HCL 2 MG PO TABS: 1.0000 mg | ORAL_TABLET | Freq: Four times a day (QID) | ORAL | 0 refills | Status: DC | PRN

## 2023-08-11 NOTE — Progress Notes (Signed)
Physical Therapy Treatment Patient Details Name: George Wang MRN: [id number] DOB: Dec 31, 1945 Today's Date: 08/11/2023   History of Present Illness 78 year old male comes to Ed 08/08/23 with  uncontrolled pain from left rib fractures 3-7 which occurred 08/06/23, He was seen in ED, returned home on pain medication. PMH: prostate cancer,  anxiety/depression, anemia, PVCs    PT Comments  Patient demonstrates improved mobility, tolerated ambulating x 400' with RW. Patient reports pain much improved control. Patient plans Dc today.    If plan is discharge home, recommend the following: A little help with walking and/or transfers;Assist for transportation;Assistance with cooking/housework;Help with stairs or ramp for entrance   Can travel by private vehicle        Equipment Recommendations  Rolling walker (2 wheels)    Recommendations for Other Services       Precautions / Restrictions Precautions Precautions: Fall Precaution Comments: left  rib fxs Restrictions Weight Bearing Restrictions Per Provider Order: No     Mobility  Bed Mobility               General bed mobility comments: seated on bed side    Transfers   Equipment used: Rolling walker (2 wheels) Transfers: Sit to/from Stand Sit to Stand: Supervision                Ambulation/Gait Ambulation/Gait assistance: Supervision Gait Distance (Feet): 400 Feet Assistive device: Rolling walker (2 wheels) Gait Pattern/deviations: Step-through pattern Gait velocity: decr     General Gait Details: gait steady   Stairs             Wheelchair Mobility     Tilt Bed    Modified Rankin (Stroke Patients Only)       Balance     Sitting balance-Leahy Scale: Good     Standing balance support: Bilateral upper extremity supported, Reliant on assistive device for balance Standing balance-Leahy Scale: Good                              Cognition Arousal: Alert                                               Exercises      General Comments        Pertinent Vitals/Pain Pain Assessment Pain Score: 3  Pain Location: left side /axilla Pain Descriptors / Indicators: Grimacing Pain Intervention(s): Monitored during session, Premedicated before session    Home Living                          Prior Function            PT Goals (current goals can now be found in the care plan section) Progress towards PT goals: Progressing toward goals    Frequency    Min 1X/week      PT Plan      Co-evaluation              AM-PAC PT "6 Clicks" Mobility   Outcome Measure  Help needed turning from your back to your side while in a flat bed without using bedrails?: A Lot Help needed moving from lying on your back to sitting on the side of a flat bed without using bedrails?: A Little Help needed moving to  and from a bed to a chair (including a wheelchair)?: A Little Help needed standing up from a chair using your arms (e.g., wheelchair or bedside chair)?: A Little Help needed to walk in hospital room?: A Little Help needed climbing 3-5 steps with a railing? : A Lot 6 Click Score: 16    End of Session   Activity Tolerance: Patient tolerated treatment well Patient left: in bed;with call bell/phone within reach;with family/visitor present Nurse Communication: Mobility status PT Visit Diagnosis: Unsteadiness on feet (R26.81);Difficulty in walking, not elsewhere classified (R26.2);Pain     Time: 1610-9604 PT Time Calculation (min) (ACUTE ONLY): 29 min  Charges:    $Gait Training: 23-37 mins PT General Charges $$ ACUTE PT VISIT: 1 Visit                     Blanchard Kelch PT Acute Rehabilitation Services Office [phone] Weekend pager-[phone]    Rada Hay 08/11/2023, 11:26 AM

## 2023-08-11 NOTE — Care Management Important Message (Signed)
Important Message  Patient Details IM Letter given. Name: George Wang MRN: [id number] Date of Birth: 1946/01/13   Important Message Given:  Yes - Medicare IM     Caren Macadam 08/11/2023, 2:12 PM

## 2023-08-11 NOTE — Progress Notes (Signed)
1014 : Oxycodone 5 mg given. Gave oxycodone 5 mg Given to pharmacy; unable to scan

## 2023-08-11 NOTE — Plan of Care (Signed)

## 2023-08-11 NOTE — Plan of Care (Signed)
  Problem: Coping: Goal: Level of anxiety will decrease Outcome: Progressing   Problem: Pain Managment: Goal: General experience of comfort will improve and/or be controlled Outcome: Progressing   Problem: Safety: Goal: Ability to remain free from injury will improve Outcome: Progressing

## 2023-08-11 NOTE — Progress Notes (Signed)
Patient does not have a legal appointed guardian

## 2023-08-11 NOTE — Discharge Summary (Signed)
Physician Discharge Summary  George Wang [name] DOB: 05/10/1946 DOA: 08/08/2023  PCP: Dettinger, Elige Radon, MD  Admit date: 08/08/2023 Discharge date: 08/11/2023  Admitted From: home Disposition:  home  Recommendations for Outpatient Follow-up:  Follow up with PCP in 1-2 weeks Please obtain BMP/CBC in one week  Home Health:yes Equipment/Devices:yes   Discharge Condition:stable CODE STATUS:full Diet recommendation: cardiac  Brief/Interim Summary:78 year old male lives at home with his wife had a mechanical fall on 08/06/2023 when he presented to [facility], ER was found to have acute fracture of the left fifth through the seventh rib posteriorly with no pneumothorax he was discharged on oxycodone. He went to see his primary doctor on the 28th of this month since oxycodone did not help, unable to manage pain at home. Wife had a hip repair and cannot take care of him. Came to the ER with intractable pain and admitted for the same. His other medical history includes type 2 diabetes hypertension hyperlipidemia depression prostate cancer/bladder CA  Discharge Diagnoses:  Principal Problem:   Hypoxia  #1 acute left rib fracture status post fall from mechanical fall-worked with therapy for 2 days he will be discharged home today with home health.  I have decreased the dose of Dilaudid prescription to 1 mg Q8 as needed from 2 mg as he is on multiple other medications that can interfere with his blood pressure and mental status.   #2 type 2 diabetes-continue metformin  #3 hypertension on Norvasc chlorthalidone and losartan #4 depression on Remeron Effexor Ativan and BuSpar   #5 hypokalemia hypomagnesemia this was repleted prior to discharge.     Estimated body mass index is 30.46 kg/m as calculated from the following:   Height as of this encounter: 5\' 3"  (1.6 m).   Weight as of this encounter: 78 kg.  Discharge Instructions  Discharge Instructions     Diet - low sodium heart  healthy   Complete by: As directed    For home use only DME 4 wheeled rolling walker with seat   Complete by: As directed    Patient needs a walker to treat with the following condition: Unsteady gait   Increase activity slowly   Complete by: As directed       Allergies as of 08/11/2023   No Known Allergies      Medication List     STOP taking these medications    Testosterone 20.25 MG/ACT (1.62%) Gel   tirzepatide 2.5 MG/0.5ML Pen Commonly known as: MOUNJARO       TAKE these medications    acetaminophen 325 MG tablet Commonly known as: TYLENOL Take 650 mg by mouth every 6 (six) hours as needed for mild pain.   amLODipine 10 MG tablet Commonly known as: NORVASC TAKE ONE (1) TABLET EACH DAY   atorvastatin 80 MG tablet Commonly known as: LIPITOR TAKE 1/2 TABLET DAILY AT 6PM   beta carotene 95621 UNIT capsule Take 25,000 Units by mouth daily.   blood glucose meter kit and supplies Dispense based on patient and insurance preference. Use up to four times daily as directed. (FOR ICD-10 E10.9, E11.9).   busPIRone 15 MG tablet Commonly known as: BUSPAR 2 qam   2  qhs   CENTRUM SILVER PO Take 1 tablet by mouth daily.   chlorthalidone 25 MG tablet Commonly known as: HYGROTON Take 0.5 tablets (12.5 mg total) by mouth daily.   fish oil-omega-3 fatty acids 1000 MG capsule Take 1 g by mouth 2 (two) times daily.  HYDROmorphone 2 MG tablet Commonly known as: Dilaudid Take 0.5 tablets (1 mg total) by mouth every 6 (six) hours as needed for severe pain (pain score 7-10). What changed:  how much to take when to take this   ibuprofen 600 MG tablet Commonly known as: ADVIL Take 600 mg by mouth every 6 (six) hours as needed.   LORazepam 0.5 MG tablet Commonly known as: Ativan 2  qam  1  qhs   1  PRN What changed:  how much to take how to take this when to take this additional instructions   losartan 100 MG tablet Commonly known as: COZAAR Take 1 tablet  (100 mg total) by mouth daily.   metFORMIN 500 MG tablet Commonly known as: GLUCOPHAGE Take 2 tablets (1,000 mg total) by mouth 2 (two) times daily with a meal.   mirtazapine 30 MG tablet Commonly known as: Remeron 1  qhs   mirtazapine 15 MG disintegrating tablet Commonly known as: REMERON SOL-TAB Take one 15 mg tablet each morning.   omeprazole 20 MG capsule Commonly known as: PRILOSEC Take 1 capsule (20 mg total) by mouth daily.   OneTouch Ultra test strip Generic drug: glucose blood TEST BLOOD SUGARS UP TO 4 TIMES DAILY DX E11.9   OT ULTRA/FASTTK CNTRL SOLN Soln USE WITH METER AS DIRECTED DX E11.9   Se-Tan PLUS 162-115.2-1 MG Caps TAKE ONE (1) CAPSULE BY MOUTH 2 TIMES DAILY   venlafaxine XR 150 MG 24 hr capsule Commonly known as: Effexor XR Take 2 capsules (300 mg total) with breakfast.   Vitamin D 50 MCG (2000 UT) Caps Take 1 capsule by mouth daily.               Durable Medical Equipment  (From admission, onward)           Start     Ordered   08/11/23 0000  For home use only DME 4 wheeled rolling walker with seat       Question:  Patient needs a walker to treat with the following condition  Answer:  Unsteady gait   08/11/23 1122            Follow-up Information     [facility] Follow up.   Why: [facility] Follow up.   Why: Will follow up with you 24 to 48 hrs after discharge. Contact information: [address] [place] [address] [phone]                No Known Allergies  Consultations: None   Procedures/Studies: DG Ribs Unilateral W/Chest Left Result Date: 08/06/2023 CLINICAL DATA:  Fall from a seated position with left rib pain. EXAM: LEFT RIBS AND CHEST - 3+ VIEW COMPARISON:  Chest radiograph dated 05/09/2017. FINDINGS: There are acute fractures of the left fifth through seventh ribs posteriorly. There is mild left basilar atelectasis/airspace disease.  There is no evidence of pneumothorax or pleural effusion. Heart size and mediastinal contours are within normal limits. Vascular calcifications are seen in the aortic arch. IMPRESSION: Acute fractures of the left fifth through seventh ribs posteriorly. No evidence of pneumothorax. Electronically Signed   By: Romona Curls M.D.   On: 08/06/2023 18:43   (Echo, Carotid, EGD, Colonoscopy, ERCP)    Subjective:  Resting in bed worked with therapy anxious to go home Discharge Exam: Vitals:   08/11/23 0607 08/11/23 1211  BP: 137/68 124/73  Pulse: 88  90  Resp: 16 18  Temp: 98 F (36.7 C) 98.1 F (36.7 C)  SpO2: 94% 93%   Vitals:   08/10/23 1220 08/10/23 1946 08/11/23 0607 08/11/23 1211  BP: (!) 144/65 121/70 137/68 124/73  Pulse: 92 87 88 90  Resp: 18 16 16 18   Temp: 97.7 F (36.5 C) 97.8 F (36.6 C) 98 F (36.7 C) 98.1 F (36.7 C)  TempSrc: Oral   Oral  SpO2: 93% 94% 94% 93%  Weight:      Height:        General: Pt is alert, awake, not in acute distress Cardiovascular: RRR, S1/S2 +, no rubs, no gallops Respiratory: CTA bilaterally, no wheezing, no rhonchi Abdominal: Soft, NT, ND, bowel sounds + Extremities: no edema, no cyanosis    The results of significant diagnostics from this hospitalization (including imaging, microbiology, ancillary and laboratory) are listed below for reference.     Microbiology: No results found for this or any previous visit (from the past 240 hours).   Labs: BNP (last 3 results) No results for input(s): "BNP" in the last 8760 hours. Basic Metabolic Panel: Recent Labs  Lab 08/08/23 1545 08/09/23 0433 08/11/23 0412  NA 134* 135 137  K 3.8 3.4* 3.8  CL 99 100 97*  CO2 26 26 28   GLUCOSE 231* 155* 122*  BUN 29* 26* 40*  CREATININE 1.06 0.89 0.95  CALCIUM 8.7* 8.8* 9.0  MG  --   --  1.5*   Liver Function Tests: Recent Labs  Lab 08/09/23 0433  AST 52*  ALT 18  ALKPHOS 50  BILITOT 0.9  PROT 6.4*  ALBUMIN 3.5   No results for  input(s): "LIPASE", "AMYLASE" in the last 168 hours. No results for input(s): "AMMONIA" in the last 168 hours. CBC: Recent Labs  Lab 08/08/23 1545 08/09/23 0433 08/11/23 0412  WBC 11.8* 8.4 9.0  NEUTROABS 9.0*  --   --   HGB 11.5* 10.7* 11.0*  HCT 32.6* 30.9* 32.4*  MCV 97.0 97.2 99.1  PLT 217 197 233   Cardiac Enzymes: No results for input(s): "CKTOTAL", "CKMB", "CKMBINDEX", "TROPONINI" in the last 168 hours. BNP: Invalid input(s): "POCBNP" CBG: Recent Labs  Lab 08/10/23 0830  GLUCAP 123*   D-Dimer No results for input(s): "DDIMER" in the last 72 hours. Hgb A1c No results for input(s): "HGBA1C" in the last 72 hours. Lipid Profile No results for input(s): "CHOL", "HDL", "LDLCALC", "TRIG", "CHOLHDL", "LDLDIRECT" in the last 72 hours. Thyroid function studies No results for input(s): "TSH", "T4TOTAL", "T3FREE", "THYROIDAB" in the last 72 hours.  Invalid input(s): "FREET3" Anemia work up No results for input(s): "VITAMINB12", "FOLATE", "FERRITIN", "TIBC", "IRON", "RETICCTPCT" in the last 72 hours. Urinalysis    Component Value Date/Time   APPEARANCEUR Clear 09/30/2022 1318   GLUCOSEU Negative 09/30/2022 1318   BILIRUBINUR Negative 09/30/2022 1318   PROTEINUR Negative 09/30/2022 1318   UROBILINOGEN negative (A) 08/31/2019 1423   NITRITE Negative 09/30/2022 1318   LEUKOCYTESUR Negative 09/30/2022 1318   Sepsis Labs Recent Labs  Lab 08/08/23 1545 08/09/23 0433 08/11/23 0412  WBC 11.8* 8.4 9.0   Microbiology No results found for this or any previous visit (from the past 240 hours).   Time coordinating discharge: 38 minutes  SIGNED: Alwyn Ren, MD  [facility] 08/11/2023, 1:31 PM

## 2023-08-12 ENCOUNTER — Telehealth: Payer: Self-pay

## 2023-08-12 DIAGNOSIS — R0902 Hypoxemia: Secondary | ICD-10-CM | POA: Diagnosis not present

## 2023-08-12 DIAGNOSIS — S2232XA Fracture of one rib, left side, initial encounter for closed fracture: Secondary | ICD-10-CM | POA: Diagnosis not present

## 2023-08-12 NOTE — Telephone Encounter (Signed)
Copied from CRM [id number]. Topic: Clinical - Home Health Verbal Orders >> Aug 12, 2023  2:39 PM Elle L wrote: Caller/Agency: [facility] Callback Number: [phone] Service Requested: Physical Therapy Frequency: 2 week 2; 1 week 2  Any new concerns about the patient? No

## 2023-08-12 NOTE — Transitions of Care (Post Inpatient/ED Visit) (Signed)
08/12/2023  Name: George Wang MRN: [id number] DOB: 1946/06/26  Today's TOC FU Call Status: Today's TOC FU Call Status:: Successful TOC FU Call Completed TOC FU Call Complete Date: 08/12/23 Patient's Name and Date of Birth confirmed.  Transition Care Management Follow-up Telephone Call Date of Discharge: 08/11/23 Type of Discharge: Inpatient Admission Primary Inpatient Discharge Diagnosis:: Hypoxia How have you been since you were released from the hospital?: Better (Reports that he is walking with his walker.) Any questions or concerns?: No  Items Reviewed: Did you receive and understand the discharge instructions provided?: Yes Medications obtained,verified, and reconciled?: Yes (Medications Reviewed) Any new allergies since your discharge?: No Dietary orders reviewed?: Yes Type of Diet Ordered:: low sodium heart healthy diet Do you have support at home?: Yes People in Home: spouse  Medications Reviewed Today: Medications Reviewed Today     Reviewed by Earlie Server, RN (Registered Nurse) on 08/12/23 at 1043  Med List Status: <None>   Medication Order Taking? Sig Documenting Provider Last Dose Status Informant  acetaminophen (TYLENOL) 325 MG tablet [id number] Yes Take 2 tablets (650 mg total) by mouth 3 (three) times daily. Alwyn Ren, MD Taking Active   amLODipine (NORVASC) 10 MG tablet [id number] Yes TAKE ONE (1) TABLET EACH DAY Dettinger, Elige Radon, MD Taking Active Self, Child, Pharmacy Records  atorvastatin (LIPITOR) 80 MG tablet [id number] Yes TAKE 1/2 TABLET DAILY AT 6PM Dettinger, Elige Radon, MD Taking Active Self, Child, Pharmacy Records  beta carotene 59563 UNIT capsule [id number] Yes Take 25,000 Units by mouth daily. [provider] Taking Active Self, Child, Pharmacy Records  Blood Glucose Calibration (OT ULTRA/FASTTK CNTRL SOLN) SOLN [id number] Yes USE WITH METER AS DIRECTED DX E11.9 Dettinger, Elige Radon, MD Taking Active Self, Child, Pharmacy Records   blood glucose meter kit and supplies [id number] Yes Dispense based on patient and insurance preference. Use up to four times daily as directed. (FOR ICD-10 E10.9, E11.9). Ernestina Penna, MD Taking Active Self, Child, Pharmacy Records  busPIRone (BUSPAR) 15 MG tablet [id number] Yes 2 qam   2  qhs Archer Asa, MD Taking Active Self, Child, Pharmacy Records  chlorthalidone (HYGROTON) 25 MG tablet [id number] Yes Take 0.5 tablets (12.5 mg total) by mouth daily. Dettinger, Elige Radon, MD Taking Active Self, Child, Pharmacy Records  Cholecalciferol (VITAMIN D) 2000 UNITS CAPS [id number] Yes Take 1 capsule by mouth daily. [provider] Taking Active Self, Child, Pharmacy Records    Discontinued 10/13/11 1130 (Error)   FeFum-FePo-FA-B Cmp-C-Zn-Mn-Cu (SE-TAN PLUS) 162-115.2-1 MG CAPS [id number] Yes TAKE ONE (1) CAPSULE BY MOUTH 2 TIMES DAILY Dettinger, Elige Radon, MD Taking Active Self, Child, Pharmacy Records  fish oil-omega-3 fatty acids 1000 MG capsule [id number] Yes Take 1 g by mouth 2 (two) times daily.  [provider] Taking Active Self, Child, Pharmacy Records  glucose blood (ONETOUCH ULTRA) test strip [id number] Yes TEST BLOOD SUGARS UP TO 4 TIMES DAILY DX E11.9 Dettinger, Elige Radon, MD Taking Active Self, Child, Pharmacy Records  HYDROmorphone (DILAUDID) 2 MG tablet [id number] No Take 0.5 tablets (1 mg total) by mouth every 6 (six) hours as needed for severe pain (pain score 7-10).  Patient not taking: Reported on 08/12/2023   Alwyn Ren, MD Not Taking Active   ibuprofen (ADVIL) 600 MG tablet [id number]  Take 600 mg by mouth every 6 (six) hours as needed. [provider]  Active Self, Child, Pharmacy Records  LORazepam (ATIVAN) 0.5 MG tablet [id number] Yes 2  qam  1  qhs  1  PRN  Patient taking differently: Take 0.5 mg by mouth 2 (two) times daily. 2 tab q am, and 1 at bedtime prn   Archer Asa, MD Taking Active Self, Child, Pharmacy Records  losartan (COZAAR) 100 MG  tablet [id number] Yes Take 1 tablet (100 mg total) by mouth daily. Dettinger, Elige Radon, MD Taking Active Self, Child, Pharmacy Records  metFORMIN (GLUCOPHAGE) 500 MG tablet [id number] Yes Take 2 tablets (1,000 mg total) by mouth 2 (two) times daily with a meal. Dettinger, Elige Radon, MD Taking Active Self, Child, Pharmacy Records  mirtazapine (REMERON SOL-TAB) 15 MG disintegrating tablet [id number] Yes Take one 15 mg tablet each morning. Archer Asa, MD Taking Active Self, Child, Pharmacy Records  mirtazapine (REMERON) 30 MG tablet [id number] Yes 1  qhs Archer Asa, MD Taking Active Self, Child, Pharmacy Records  Multiple Vitamins-Minerals (CENTRUM SILVER PO) [id number] Yes Take 1 tablet by mouth daily.  [provider] Taking Active Self, Child, Pharmacy Records  omeprazole (PRILOSEC) 20 MG capsule [id number] Yes Take 1 capsule (20 mg total) by mouth daily. Dettinger, Elige Radon, MD Taking Active Self, Child, Pharmacy Records    Discontinued 10/13/11 1131 (Error)   venlafaxine XR (EFFEXOR XR) 150 MG 24 hr capsule [id number] Yes Take 2 capsules (300 mg total) with breakfast. Archer Asa, MD Taking Active Self, Child, Pharmacy Records            Home Care and Equipment/Supplies: Were Home Health Services Ordered?: Yes [facility] of Home Health Agency:: [facility] Has Agency set up a time to come to your home?: Yes First Home Health Visit Date: 08/12/23 Any new equipment or medical supplies ordered?: Yes Name of Medical supply agency?: [facility] Were you able to get the equipment/medical supplies?: Yes Do you have any questions related to the use of the equipment/supplies?: No  Functional Questionnaire: Do you need assistance with bathing/showering or dressing?: Yes (wife assist) Do you need assistance with meal preparation?: Yes (wife) Do you need assistance with eating?: No Do you have difficulty maintaining continence: No Do you need assistance with getting out of bed/getting out of a  chair/moving?: No Do you have difficulty managing or taking your medications?: No  Follow up appointments reviewed: PCP Follow-up appointment confirmed?: Yes Date of PCP follow-up appointment?: 08/18/23 Follow-up Provider: [facility] Follow-up appointment confirmed?: No Do you need transportation to your follow-up appointment?: No Do you understand care options if your condition(s) worsen?: Yes-patient verbalized understanding  SDOH Interventions Today    Flowsheet Row Most Recent Value  SDOH Interventions   Food Insecurity Interventions Intervention Not Indicated  Housing Interventions Intervention Not Indicated  Transportation Interventions Intervention Not Indicated  Utilities Interventions Intervention Not Indicated      Patient reports that he is doing well. Reports that he is not taking his dilaudid and trying to manage with tylenol and ibuprofen.  Reports that he is doing well. States home health PT is coming today to do his assessment.  Reviewed and offered 30 day TOC program and patient declines.Provided my contact information for patient to call me if needed.  TOC Interventions Today    Flowsheet Row Most Recent Value  TOC Interventions   TOC Interventions Discussed/Reviewed TOC Interventions Discussed, TOC Interventions Reviewed, Post discharge activity limitations per provider  [confirmed Home health coming today.]       Interventions Today    Flowsheet Row Most Recent Value  Chronic Disease   Chronic disease during today's visit Other  [fall]  Exercise Interventions  Exercise Discussed/Reviewed Exercise Discussed  Education Interventions   Education Provided Provided Education  [Reviewed with patient the importance of using his incentive spirometer to avoid lung infections.  Encouraged patient to use as often as he can.]  Provided Verbal Education On Nutrition  Pharmacy Interventions   Pharmacy Dicussed/Reviewed Medications and their functions   Safety Interventions   Safety Discussed/Reviewed Fall Risk       Lonia Chimera, RN, BSN, [employer] [facility]- Transition of Care Team.  [facility] [phone]

## 2023-08-15 ENCOUNTER — Encounter: Payer: Self-pay | Admitting: Family Medicine

## 2023-08-15 NOTE — Telephone Encounter (Signed)
Left message for George Wang to continue current PT orders.

## 2023-08-16 DIAGNOSIS — S2232XA Fracture of one rib, left side, initial encounter for closed fracture: Secondary | ICD-10-CM | POA: Diagnosis not present

## 2023-08-16 DIAGNOSIS — R0902 Hypoxemia: Secondary | ICD-10-CM | POA: Diagnosis not present

## 2023-08-18 ENCOUNTER — Ambulatory Visit (INDEPENDENT_AMBULATORY_CARE_PROVIDER_SITE_OTHER): Payer: Medicare Other | Admitting: Family Medicine

## 2023-08-18 ENCOUNTER — Encounter: Payer: Self-pay | Admitting: Family Medicine

## 2023-08-18 VITALS — BP 142/66 | HR 91 | Ht 63.0 in | Wt 177.0 lb

## 2023-08-18 DIAGNOSIS — S81812D Laceration without foreign body, left lower leg, subsequent encounter: Secondary | ICD-10-CM

## 2023-08-18 DIAGNOSIS — R0902 Hypoxemia: Secondary | ICD-10-CM | POA: Diagnosis not present

## 2023-08-18 DIAGNOSIS — S2232XA Fracture of one rib, left side, initial encounter for closed fracture: Secondary | ICD-10-CM | POA: Diagnosis not present

## 2023-08-18 DIAGNOSIS — S2232XK Fracture of one rib, left side, subsequent encounter for fracture with nonunion: Secondary | ICD-10-CM | POA: Diagnosis not present

## 2023-08-19 NOTE — Progress Notes (Signed)
BP (!) 142/66   Pulse 91   Ht 5\' 3"  (1.6 m)   Wt 80.3 kg   SpO2 95%   BMI 31.35 kg/m    Subjective:   Patient ID: George Wang, male    DOB: [birth year], 78 y.o.   MRN: [id number]  HPI: George Wang is a 78 y.o. male presenting on 08/18/2023 for Suture / Staple Removal (LLE. From recent fall.)   Suture / Staple Removal   He comes in today for removal of 3 stapes placed on the anterior aspect of his left lower leg placed on 08/06/2023 from a laceration he sustained after a fall. He reports no issues at the injury site. Denies pain, swelling, discharge or bleeding from injury area. Still having some discomfort with his left-sided rib fractures but is alternating using Ibuprofen and Tylenol with pain relief. Ribs are still tender to pressure but better than previous visit on 08/08/2023. He is using incentive spirometry at home to help with deep breathing. Denies fever, chills, chest pain or shortness of breath.   Relevant past medical, surgical, family and social history reviewed and updated as indicated. Interim medical history since our last visit reviewed. Allergies and medications reviewed and updated.  Review of Systems  Constitutional:  Negative for chills and fever.  Respiratory:  Negative for chest tightness and shortness of breath.   Cardiovascular:  Negative for chest pain and leg swelling.  Skin:  Negative for rash.       3cm laceration on left lower leg  Neurological:  Negative for weakness and numbness.  Psychiatric/Behavioral:  Negative for agitation.   All other systems reviewed and are negative.   Per HPI unless specifically indicated above   Allergies as of 08/18/2023   No Known Allergies      Medication List        Accurate as of August 18, 2023 11:59 PM. If you have any questions, ask your nurse or doctor.          acetaminophen 325 MG tablet Commonly known as: TYLENOL Take 2 tablets (650 mg total) by mouth 3 (three) times daily.    amLODipine 10 MG tablet Commonly known as: NORVASC TAKE ONE (1) TABLET EACH DAY   atorvastatin 80 MG tablet Commonly known as: LIPITOR TAKE 1/2 TABLET DAILY AT 6PM   beta carotene 78295 UNIT capsule Take 25,000 Units by mouth daily.   blood glucose meter kit and supplies Dispense based on patient and insurance preference. Use up to four times daily as directed. (FOR ICD-10 E10.9, E11.9).   busPIRone 15 MG tablet Commonly known as: BUSPAR 2 qam   2  qhs   CENTRUM SILVER PO Take 1 tablet by mouth daily.   chlorthalidone 25 MG tablet Commonly known as: HYGROTON Take 0.5 tablets (12.5 mg total) by mouth daily.   fish oil-omega-3 fatty acids 1000 MG capsule Take 1 g by mouth 2 (two) times daily.   HYDROmorphone 2 MG tablet Commonly known as: Dilaudid Take 0.5 tablets (1 mg total) by mouth every 6 (six) hours as needed for severe pain (pain score 7-10).   ibuprofen 600 MG tablet Commonly known as: ADVIL Take 600 mg by mouth every 6 (six) hours as needed.   LORazepam 0.5 MG tablet Commonly known as: Ativan 2  qam  1  qhs   1  PRN What changed:  how much to take how to take this when to take this additional instructions   losartan 100 MG tablet  Commonly known as: COZAAR Take 1 tablet (100 mg total) by mouth daily.   metFORMIN 500 MG tablet Commonly known as: GLUCOPHAGE Take 2 tablets (1,000 mg total) by mouth 2 (two) times daily with a meal.   mirtazapine 30 MG tablet Commonly known as: Remeron 1  qhs   mirtazapine 15 MG disintegrating tablet Commonly known as: REMERON SOL-TAB Take one 15 mg tablet each morning.   omeprazole 20 MG capsule Commonly known as: PRILOSEC Take 1 capsule (20 mg total) by mouth daily.   OneTouch Ultra test strip Generic drug: glucose blood TEST BLOOD SUGARS UP TO 4 TIMES DAILY DX E11.9   OT ULTRA/FASTTK CNTRL SOLN Soln USE WITH METER AS DIRECTED DX E11.9   Se-Tan PLUS 162-115.2-1 MG Caps TAKE ONE (1) CAPSULE BY MOUTH 2 TIMES  DAILY   venlafaxine XR 150 MG 24 hr capsule Commonly known as: Effexor XR Take 2 capsules (300 mg total) with breakfast.   Vitamin D 50 MCG (2000 UT) Caps Take 1 capsule by mouth daily.         Objective:   BP (!) 142/66   Pulse 91   Ht 5\' 3"  (1.6 m)   Wt 80.3 kg   SpO2 95%   BMI 31.35 kg/m   Wt Readings from Last 3 Encounters:  08/18/23 80.3 kg  08/08/23 78 kg  08/06/23 77.1 kg    Physical Exam Constitutional:      General: He is not in acute distress.    Appearance: Normal appearance.  Cardiovascular:     Rate and Rhythm: Normal rate and regular rhythm.  Pulmonary:     Effort: Pulmonary effort is normal. No respiratory distress.  Skin:    General: Skin is warm and dry.     Findings: Laceration (3 staples placed across Left lower leg.) present. No bruising or erythema.  Neurological:     Mental Status: He is alert.  Psychiatric:        Behavior: Behavior normal.    Assessment & Plan:   Problem List Items Addressed This Visit       Musculoskeletal and Integument   Traumatic closed displaced fracture of rib on left side   Other Visit Diagnoses       Laceration of left lower extremity, subsequent encounter    -  Primary       Laceration on left lower leg is healing well. There appears to be no erythema, edema, or drainage noted around site. The 3 staples were removed from laceration. No issues noted after removal of staples. Patient tolerated well. Notify PCP if there is any issue or any changes where the laceration is healed. Recommended he continue to use incentive spirometry at home and deep breathing techniques to help prevent any pneumonia. Continue using the ibuprofen as needed for relief of discomfort if that seems to be working for him.  Follow up plan: Return if symptoms worsen or fail to improve.  Counseling provided for all of the vaccine components No orders of the defined types were placed in this encounter.   Maximino Sarin  PA-S [facility] 08/19/2023, 7:42 AM  I was personally present for all components of the history, physical exam and/or medical decision making.  I agree with the documentation performed by the student and agree with assessment and plan above.  Arville Care, MD [facility] 08/19/2023, 12:42 PM

## 2023-08-30 ENCOUNTER — Other Ambulatory Visit (HOSPITAL_COMMUNITY): Payer: Self-pay | Admitting: Psychiatry

## 2023-08-31 ENCOUNTER — Ambulatory Visit: Payer: Medicare Other | Admitting: Family Medicine

## 2023-08-31 ENCOUNTER — Encounter: Payer: Self-pay | Admitting: Family Medicine

## 2023-08-31 VITALS — BP 125/77 | HR 99 | Ht 63.0 in | Wt 170.0 lb

## 2023-08-31 DIAGNOSIS — I10 Essential (primary) hypertension: Secondary | ICD-10-CM | POA: Diagnosis not present

## 2023-08-31 DIAGNOSIS — E782 Mixed hyperlipidemia: Secondary | ICD-10-CM

## 2023-08-31 DIAGNOSIS — E1159 Type 2 diabetes mellitus with other circulatory complications: Secondary | ICD-10-CM

## 2023-08-31 DIAGNOSIS — I152 Hypertension secondary to endocrine disorders: Secondary | ICD-10-CM

## 2023-08-31 DIAGNOSIS — E1165 Type 2 diabetes mellitus with hyperglycemia: Secondary | ICD-10-CM

## 2023-08-31 DIAGNOSIS — Z7984 Long term (current) use of oral hypoglycemic drugs: Secondary | ICD-10-CM | POA: Diagnosis not present

## 2023-08-31 DIAGNOSIS — E1169 Type 2 diabetes mellitus with other specified complication: Secondary | ICD-10-CM

## 2023-08-31 DIAGNOSIS — E785 Hyperlipidemia, unspecified: Secondary | ICD-10-CM | POA: Diagnosis not present

## 2023-08-31 LAB — CBC WITH DIFFERENTIAL/PLATELET
Basophils Absolute: 0.1 10*3/uL (ref 0.0–0.2)
Basos: 1 %
EOS (ABSOLUTE): 0.2 10*3/uL (ref 0.0–0.4)
Eos: 2 %
Hematocrit: 36.2 % — ABNORMAL LOW (ref 37.5–51.0)
Hemoglobin: 12.2 g/dL — ABNORMAL LOW (ref 13.0–17.7)
Immature Grans (Abs): 0 10*3/uL (ref 0.0–0.1)
Immature Granulocytes: 0 %
Lymphocytes Absolute: 1.8 10*3/uL (ref 0.7–3.1)
Lymphs: 22 %
MCH: 33.2 pg — ABNORMAL HIGH (ref 26.6–33.0)
MCHC: 33.7 g/dL (ref 31.5–35.7)
MCV: 99 fL — ABNORMAL HIGH (ref 79–97)
Monocytes Absolute: 0.7 10*3/uL (ref 0.1–0.9)
Monocytes: 9 %
Neutrophils Absolute: 5.4 10*3/uL (ref 1.4–7.0)
Neutrophils: 66 %
Platelets: 338 10*3/uL (ref 150–450)
RBC: 3.67 x10E6/uL — ABNORMAL LOW (ref 4.14–5.80)
RDW: 11.8 % (ref 11.6–15.4)
WBC: 8.2 10*3/uL (ref 3.4–10.8)

## 2023-08-31 LAB — BAYER DCA HB A1C WAIVED: HB A1C (BAYER DCA - WAIVED): 6.6 % — ABNORMAL HIGH (ref 4.8–5.6)

## 2023-08-31 MED ORDER — CHLORTHALIDONE 25 MG PO TABS: 12.5000 mg | ORAL_TABLET | Freq: Every day | ORAL | 3 refills | Status: AC

## 2023-08-31 MED ORDER — AMLODIPINE BESYLATE 10 MG PO TABS: ORAL_TABLET | ORAL | 3 refills | Status: DC

## 2023-08-31 MED ORDER — ATORVASTATIN CALCIUM 80 MG PO TABS: ORAL_TABLET | ORAL | 3 refills | Status: AC

## 2023-08-31 MED ORDER — ONETOUCH ULTRA VI STRP: ORAL_STRIP | 3 refills | Status: DC

## 2023-08-31 MED ORDER — LOSARTAN POTASSIUM 100 MG PO TABS: 100.0000 mg | ORAL_TABLET | Freq: Every day | ORAL | 3 refills | Status: AC

## 2023-08-31 MED ORDER — METFORMIN HCL 500 MG PO TABS: 1000.0000 mg | ORAL_TABLET | Freq: Two times a day (BID) | ORAL | 3 refills | Status: AC

## 2023-08-31 NOTE — Addendum Note (Signed)
Addended by: Arville Care on: 08/31/2023 11:46 AM   Modules accepted: Orders

## 2023-08-31 NOTE — Progress Notes (Signed)
BP 125/77   Pulse 99   Ht 5\' 3"  (1.6 m)   Wt 170 lb (77.1 kg)   SpO2 98%   BMI 30.11 kg/m    Subjective:   Patient ID: George Wang, male    DOB: [birth year], 78 y.o.   MRN: [id number]  HPI: George Wang is a 78 y.o. male presenting on 08/31/2023 for Medical Management of Chronic Issues and Diabetes   HPI Type 2 diabetes mellitus Patient comes in today for recheck of his diabetes. Patient has been currently taking metformin. Patient is currently on an ACE inhibitor/ARB. Patient has not seen an ophthalmologist this year. Patient denies any new issues with their feet. The symptom started onset as an adult hypertension and hyperlipidemia ARE RELATED TO DM   Hypertension Patient is currently on amlodipine and chlorthalidone and losartan, and their blood pressure today is 125/77. Patient denies any lightheadedness or dizziness. Patient denies headaches, blurred vision, chest pains, shortness of breath, or weakness. Denies any side effects from medication and is content with current medication.   Hyperlipidemia Patient is coming in for recheck of his hyperlipidemia. The patient is currently taking atorvastatin. They deny any issues with myalgias or history of liver damage from it. They deny any focal numbness or weakness or chest pain.   Relevant past medical, surgical, family and social history reviewed and updated as indicated. Interim medical history since our last visit reviewed. Allergies and medications reviewed and updated.  Review of Systems  Constitutional:  Negative for chills and fever.  Eyes:  Negative for visual disturbance.  Respiratory:  Negative for shortness of breath and wheezing.   Cardiovascular:  Negative for chest pain and leg swelling.  Musculoskeletal:  Negative for back pain and gait problem.  Skin:  Negative for rash.  Neurological:  Negative for dizziness, weakness and light-headedness.  All other systems reviewed and are negative.   Per HPI unless  specifically indicated above   Allergies as of 08/31/2023   No Known Allergies      Medication List        Accurate as of August 31, 2023 11:41 AM. If you have any questions, ask your nurse or doctor.          STOP taking these medications    HYDROmorphone 2 MG tablet Commonly known as: Dilaudid Stopped by: George Wang       TAKE these medications    acetaminophen 325 MG tablet Commonly known as: TYLENOL Take 2 tablets (650 mg total) by mouth 3 (three) times daily.   amLODipine 10 MG tablet Commonly known as: NORVASC TAKE ONE (1) TABLET EACH DAY   atorvastatin 80 MG tablet Commonly known as: LIPITOR TAKE 1/2 TABLET DAILY AT 6PM   beta carotene 82956 UNIT capsule Take 25,000 Units by mouth daily.   blood glucose meter kit and supplies Dispense based on patient and insurance preference. Use up to four times daily as directed. (FOR ICD-10 E10.9, E11.9).   busPIRone 15 MG tablet Commonly known as: BUSPAR 2 qam   2  qhs   CENTRUM SILVER Wang Take 1 tablet by mouth daily.   chlorthalidone 25 MG tablet Commonly known as: HYGROTON Take 0.5 tablets (12.5 mg total) by mouth daily.   fish oil-omega-3 fatty acids 1000 MG capsule Take 1 g by mouth 2 (two) times daily.   ibuprofen 600 MG tablet Commonly known as: ADVIL Take 600 mg by mouth every 6 (six) hours as needed.   LORazepam 0.5  MG tablet Commonly known as: Ativan 2  qam  1  qhs   1  PRN What changed:  how much to take how to take this when to take this additional instructions   losartan 100 MG tablet Commonly known as: COZAAR Take 1 tablet (100 mg total) by mouth daily.   metFORMIN 500 MG tablet Commonly known as: GLUCOPHAGE Take 2 tablets (1,000 mg total) by mouth 2 (two) times daily with a meal.   mirtazapine 30 MG tablet Commonly known as: Remeron 1  qhs   mirtazapine 15 MG disintegrating tablet Commonly known as: REMERON SOL-TAB Take one 15 mg tablet each morning.    omeprazole 20 MG capsule Commonly known as: PRILOSEC Take 1 capsule (20 mg total) by mouth daily.   OneTouch Ultra test strip Generic drug: glucose blood TEST BLOOD SUGARS UP TO 4 TIMES DAILY DX E11.9   OT ULTRA/FASTTK CNTRL SOLN Soln USE WITH METER AS DIRECTED DX E11.9   Se-Tan PLUS 162-115.2-1 MG Caps TAKE ONE (1) CAPSULE BY MOUTH 2 TIMES DAILY   venlafaxine XR 150 MG 24 hr capsule Commonly known as: Effexor XR Take 2 capsules (300 mg total) with breakfast.   Vitamin D 50 MCG (2000 UT) Caps Take 1 capsule by mouth daily.         Objective:   BP 125/77   Pulse 99   Ht 5\' 3"  (1.6 m)   Wt 170 lb (77.1 kg)   SpO2 98%   BMI 30.11 kg/m   Wt Readings from Last 3 Encounters:  08/31/23 170 lb (77.1 kg)  08/18/23 177 lb (80.3 kg)  08/08/23 171 lb 15.3 oz (78 kg)    Physical Exam Vitals and nursing note reviewed.  Constitutional:      General: He is not in acute distress.    Appearance: He is well-developed. He is not diaphoretic.  Eyes:     General: No scleral icterus.    Conjunctiva/sclera: Conjunctivae normal.  Neck:     Thyroid: No thyromegaly.  Cardiovascular:     Rate and Rhythm: Normal rate and regular rhythm.     Heart sounds: Normal heart sounds. No murmur heard. Pulmonary:     Effort: Pulmonary effort is normal. No respiratory distress.     Breath sounds: Normal breath sounds. No wheezing.  Musculoskeletal:        General: No swelling. Normal range of motion.     Cervical back: Neck supple.  Lymphadenopathy:     Cervical: No cervical adenopathy.  Skin:    General: Skin is warm and dry.     Findings: No rash.  Neurological:     Mental Status: He is alert and oriented to person, place, and time.     Coordination: Coordination normal.  Psychiatric:        Behavior: Behavior normal.       Assessment & Plan:   Problem List Items Addressed This Visit       Cardiovascular and Mediastinum   Hypertension associated with diabetes (HCC)    Relevant Medications   amLODipine (NORVASC) 10 MG tablet   atorvastatin (LIPITOR) 80 MG tablet   chlorthalidone (HYGROTON) 25 MG tablet   losartan (COZAAR) 100 MG tablet   metFORMIN (GLUCOPHAGE) 500 MG tablet     Endocrine   Hyperlipidemia associated with type 2 diabetes mellitus (HCC)   Relevant Medications   amLODipine (NORVASC) 10 MG tablet   atorvastatin (LIPITOR) 80 MG tablet   chlorthalidone (HYGROTON) 25 MG tablet  losartan (COZAAR) 100 MG tablet   metFORMIN (GLUCOPHAGE) 500 MG tablet   Type 2 diabetes mellitus (HCC) - Primary   Relevant Medications   atorvastatin (LIPITOR) 80 MG tablet   losartan (COZAAR) 100 MG tablet   metFORMIN (GLUCOPHAGE) 500 MG tablet   Other Relevant Orders   Bayer DCA Hb A1c Waived   CBC with Differential/Platelet   Other Visit Diagnoses       Essential hypertension       Relevant Medications   amLODipine (NORVASC) 10 MG tablet   atorvastatin (LIPITOR) 80 MG tablet   chlorthalidone (HYGROTON) 25 MG tablet   losartan (COZAAR) 100 MG tablet   metFORMIN (GLUCOPHAGE) 500 MG tablet     Mixed hyperlipidemia       Relevant Medications   amLODipine (NORVASC) 10 MG tablet   atorvastatin (LIPITOR) 80 MG tablet   chlorthalidone (HYGROTON) 25 MG tablet   losartan (COZAAR) 100 MG tablet   metFORMIN (GLUCOPHAGE) 500 MG tablet       A1c looks good at 6.6.  Will check other blood work and see how he is doing.  Follow-up in 3 months Follow up plan: Return in about 3 months (around 11/28/2023), or if symptoms worsen or fail to improve, for Diabetes and hypertension and hld.  Counseling provided for all of the vaccine components Orders Placed This Encounter  Procedures   Bayer DCA Hb A1c Waived   CBC with Differential/Platelet    Arville Care, MD [facility] 08/31/2023, 11:41 AM

## 2023-09-01 ENCOUNTER — Telehealth: Payer: Self-pay

## 2023-09-01 NOTE — Telephone Encounter (Signed)
Copied from CRM [id number]. Topic: Clinical - Home Health Verbal Orders >> Sep 01, 2023 10:31 AM Arabella Merles wrote: Caller/Agency: [facility] from [facility]  Callback Number: [phone] Service Requested: Physical Therapy Frequency: She is requesting verbal orders to discharge the patient from Home Health Physical Therapy per the patient's request.  Any new concerns about the patient? No

## 2023-09-01 NOTE — Telephone Encounter (Signed)
Left message for [facility] physical therapist informing that per Dr. Louanne Skye it is alright to d/c Home PT as pt wished.

## 2023-09-01 NOTE — Telephone Encounter (Signed)
If patient is requesting to be discharged from home health then I am fine with giving the okay for those verbal orders if he is not going to do it anyways.

## 2023-09-08 ENCOUNTER — Encounter: Payer: Self-pay | Admitting: Family Medicine

## 2023-09-12 ENCOUNTER — Ambulatory Visit (INDEPENDENT_AMBULATORY_CARE_PROVIDER_SITE_OTHER): Payer: Medicare Other

## 2023-09-12 DIAGNOSIS — Z85828 Personal history of other malignant neoplasm of skin: Secondary | ICD-10-CM | POA: Diagnosis not present

## 2023-09-12 DIAGNOSIS — M109 Gout, unspecified: Secondary | ICD-10-CM | POA: Diagnosis not present

## 2023-09-12 DIAGNOSIS — X32XXXD Exposure to sunlight, subsequent encounter: Secondary | ICD-10-CM | POA: Diagnosis not present

## 2023-09-12 DIAGNOSIS — S2242XD Multiple fractures of ribs, left side, subsequent encounter for fracture with routine healing: Secondary | ICD-10-CM

## 2023-09-12 DIAGNOSIS — K589 Irritable bowel syndrome without diarrhea: Secondary | ICD-10-CM

## 2023-09-12 DIAGNOSIS — S2232XD Fracture of one rib, left side, subsequent encounter for fracture with routine healing: Secondary | ICD-10-CM

## 2023-09-12 DIAGNOSIS — E1159 Type 2 diabetes mellitus with other circulatory complications: Secondary | ICD-10-CM

## 2023-09-12 DIAGNOSIS — K5730 Diverticulosis of large intestine without perforation or abscess without bleeding: Secondary | ICD-10-CM

## 2023-09-12 DIAGNOSIS — I700 Atherosclerosis of aorta: Secondary | ICD-10-CM

## 2023-09-12 DIAGNOSIS — E785 Hyperlipidemia, unspecified: Secondary | ICD-10-CM

## 2023-09-12 DIAGNOSIS — C44612 Basal cell carcinoma of skin of right upper limb, including shoulder: Secondary | ICD-10-CM | POA: Diagnosis not present

## 2023-09-12 DIAGNOSIS — I493 Ventricular premature depolarization: Secondary | ICD-10-CM | POA: Diagnosis not present

## 2023-09-12 DIAGNOSIS — E1169 Type 2 diabetes mellitus with other specified complication: Secondary | ICD-10-CM

## 2023-09-12 DIAGNOSIS — F32A Depression, unspecified: Secondary | ICD-10-CM

## 2023-09-12 DIAGNOSIS — F411 Generalized anxiety disorder: Secondary | ICD-10-CM

## 2023-09-12 DIAGNOSIS — L570 Actinic keratosis: Secondary | ICD-10-CM | POA: Diagnosis not present

## 2023-09-12 DIAGNOSIS — Z08 Encounter for follow-up examination after completed treatment for malignant neoplasm: Secondary | ICD-10-CM | POA: Diagnosis not present

## 2023-09-13 ENCOUNTER — Other Ambulatory Visit: Payer: Self-pay

## 2023-09-13 DIAGNOSIS — E291 Testicular hypofunction: Secondary | ICD-10-CM

## 2023-10-01 ENCOUNTER — Other Ambulatory Visit: Payer: Self-pay | Admitting: Family Medicine

## 2023-10-06 ENCOUNTER — Other Ambulatory Visit: Payer: Medicare Other

## 2023-10-06 DIAGNOSIS — E291 Testicular hypofunction: Secondary | ICD-10-CM

## 2023-10-07 LAB — TESTOSTERONE: Testosterone: 29 ng/dL — ABNORMAL LOW (ref 264–916)

## 2023-10-07 LAB — HEMOGLOBIN: Hemoglobin: 12 g/dL — ABNORMAL LOW (ref 13.0–17.7)

## 2023-10-07 LAB — PSA: Prostate Specific Ag, Serum: <0.1 ng/mL (ref 0.0–4.0)

## 2023-10-13 ENCOUNTER — Ambulatory Visit: Payer: Medicare Other | Admitting: Urology

## 2023-10-13 ENCOUNTER — Encounter: Payer: Self-pay | Admitting: Urology

## 2023-10-13 VITALS — BP 152/71 | HR 82

## 2023-10-13 DIAGNOSIS — N393 Stress incontinence (female) (male): Secondary | ICD-10-CM

## 2023-10-13 DIAGNOSIS — Z87442 Personal history of urinary calculi: Secondary | ICD-10-CM

## 2023-10-13 DIAGNOSIS — E291 Testicular hypofunction: Secondary | ICD-10-CM | POA: Diagnosis not present

## 2023-10-13 DIAGNOSIS — Z8546 Personal history of malignant neoplasm of prostate: Secondary | ICD-10-CM

## 2023-10-13 LAB — URINALYSIS, ROUTINE W REFLEX MICROSCOPIC
Bilirubin, UA: NEGATIVE
Leukocytes,UA: NEGATIVE
Nitrite, UA: NEGATIVE
Protein,UA: NEGATIVE
RBC, UA: NEGATIVE
Specific Gravity, UA: 1.02 (ref 1.005–1.030)
Urobilinogen, Ur: 1 mg/dL (ref 0.2–1.0)
pH, UA: 6.5 (ref 5.0–7.5)

## 2023-10-13 NOTE — Progress Notes (Unsigned)
 Subjective:  1. Hypogonadism in male   2. Personal history of malignant neoplasm of prostate   3. Male stress incontinence   4. History of nephrolithiasis      George Wang is a 78 year-old male established patient who is here for his history of prostate cancer  His most recent PSA is <0.1 on 10/06/23  His testosterone is 29.    George Wang returns in f/u for his history of prostate cancer treated in 09/2003 with radical prostatectomy. His PSA's have been undetectible. The PSA prior to this visit was <0.1. He is voiding well with minimal incontinence which has improved. he no longer wears a guard. He has some nocturia x 1-3. He has persistant ED. He is not on treatment. He has hypogonadism with a testosterone of 29.  I prescribed topical testosterone at his last visit but he didn't use it.  He has a history of stones but has had no hematuria or flank pain.   He had a CT AP in 2/21 that showed no stones.  He has no associated signs or symptoms.  IPSS is 3.   IPSS     Row Name 10/13/23 1400         International Prostate Symptom Score   How often have you had the sensation of not emptying your bladder? Not at All     How often have you had to urinate less than every two hours? Less than 1 in 5 times     How often have you found you stopped and started again several times when you urinated? Not at All     How often have you found it difficult to postpone urination? Not at All     How often have you had a weak urinary stream? Not at All     How often have you had to strain to start urination? Not at All     How many times did you typically get up at night to urinate? 2 Times     Total IPSS Score 3       Quality of Life due to urinary symptoms   If you were to spend the rest of your life with your urinary condition just the way it is now how would you feel about that? Delighted               ROS:  ROS:  A complete review of systems was performed.  All systems are negative except for  pertinent findings as noted.   Review of Systems  Musculoskeletal:  Positive for back pain and joint pain.  Endo/Heme/Allergies:  Bruises/bleeds easily.  Psychiatric/Behavioral:  Positive for depression.   All other systems reviewed and are negative.   No Known Allergies  Outpatient Encounter Medications as of 10/13/2023  Medication Sig   acetaminophen (TYLENOL) 325 MG tablet Take 2 tablets (650 mg total) by mouth 3 (three) times daily.   amLODipine (NORVASC) 10 MG tablet TAKE ONE (1) TABLET EACH DAY   atorvastatin (LIPITOR) 80 MG tablet TAKE 1/2 TABLET DAILY AT 6PM   beta carotene 40981 UNIT capsule Take 25,000 Units by mouth daily.   Blood Glucose Calibration (OT ULTRA/FASTTK CNTRL SOLN) SOLN USE WITH METER AS DIRECTED DX E11.9   blood glucose meter kit and supplies Dispense based on patient and insurance preference. Use up to four times daily as directed. (FOR ICD-10 E10.9, E11.9).   busPIRone (BUSPAR) 15 MG tablet 2 qam   2  qhs   chlorthalidone (HYGROTON) 25  MG tablet Take 0.5 tablets (12.5 mg total) by mouth daily.   Cholecalciferol (VITAMIN D) 2000 UNITS CAPS Take 1 capsule by mouth daily.   FeFum-FePo-FA-B Cmp-C-Zn-Mn-Cu (SE-TAN PLUS) 162-115.2-1 MG CAPS TAKE ONE (1) CAPSULE BY MOUTH 2 TIMES DAILY   fish oil-omega-3 fatty acids 1000 MG capsule Take 1 g by mouth 2 (two) times daily.    glucose blood (ONETOUCH ULTRA) test strip TEST BLOOD SUGARS UP TO 2 TIMES DAILY DX E11.9   ibuprofen (ADVIL) 600 MG tablet Take 600 mg by mouth every 6 (six) hours as needed.   LORazepam (ATIVAN) 0.5 MG tablet 2  qam  1  qhs   1  PRN (Patient taking differently: Take 0.5 mg by mouth 2 (two) times daily. 2 tab q am, and 1 at bedtime prn)   losartan (COZAAR) 100 MG tablet Take 1 tablet (100 mg total) by mouth daily.   metFORMIN (GLUCOPHAGE) 500 MG tablet Take 2 tablets (1,000 mg total) by mouth 2 (two) times daily with a meal.   mirtazapine (REMERON SOL-TAB) 15 MG disintegrating tablet Take one 15 mg  tablet each morning.   mirtazapine (REMERON) 30 MG tablet 1  qhs   Multiple Vitamins-Minerals (CENTRUM SILVER PO) Take 1 tablet by mouth daily.    omeprazole (PRILOSEC) 20 MG capsule Take 1 capsule (20 mg total) by mouth daily.   venlafaxine XR (EFFEXOR XR) 150 MG 24 hr capsule Take 2 capsules (300 mg total) with breakfast.   [DISCONTINUED] febuxostat (ULORIC) 40 MG tablet Take 80 mg by mouth daily.   [DISCONTINUED] simvastatin (ZOCOR) 80 MG tablet Take 80 mg by mouth at bedtime. (Patient not taking: Reported on 08/31/2023)   No facility-administered encounter medications on file as of 10/13/2023.    Past Medical History:  Diagnosis Date   Allergy    mild   Anemia    Iron deficiency   Anxiety state, unspecified    Aortic atherosclerosis (HCC)    Arthritis    Calculus of kidney    kidney stones  - normal BMP    Cancer (HCC)    Prostate, skin cancer x 2    Cataract    forming both eyes   Depressive disorder, not elsewhere classified    Diverticulosis of colon (without mention of hemorrhage)    GERD (gastroesophageal reflux disease)    on nexium   Gout, unspecified    Heart murmur    Hyperlipidemia    Hypertension, essential    Irritable bowel syndrome    Neuromuscular disorder (HCC)    numbness in knees from back issues    Personal history of malignant neoplasm of prostate    PVC (premature ventricular contraction)    Type II or unspecified type diabetes mellitus without mention of complication, not stated as uncontrolled     Past Surgical History:  Procedure Laterality Date   COLONOSCOPY     HERNIA REPAIR     left side   POLYPECTOMY     PROSTATE SURGERY     skin cancer removed     chest x 2 3-4 yrs ago    TONSILLECTOMY      Social History   Socioeconomic History   Marital status: Married    Spouse name: Harriett Sine   Number of children: 2   Years of education: 13   Highest education level: Some college, no degree  Occupational History   Occupation:  Charity fundraiser    Comment: Part time  Tobacco Use   Smoking status: Former  Current packs/day: 0.00    Average packs/day: 1 pack/day for 19.0 years (19.0 ttl pk-yrs)    Types: Cigarettes    Start date: 10/13/1959    Quit date: 10/13/1978    Years since quitting: 45.0   Smokeless tobacco: Former    Types: Snuff    Quit date: 05/30/2008  Vaping Use   Vaping status: Never Used  Substance and Sexual Activity   Alcohol use: Not Currently   Drug use: No   Sexual activity: Yes  Other Topics Concern   Not on file  Social History Narrative   Lives at home with wife.  He has one son and a daughter that died in a tornado in 22. 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 He has one living grandson and one grandson that passed away in 11/10/16 at age 68 from cancer.   Social Drivers of Corporate investment banker Strain: Low Risk  (10/20/2022)   Overall Financial Resource Strain (CARDIA)    Difficulty of Paying Living Expenses: Not hard at all  Food Insecurity: No Food Insecurity (08/12/2023)   Hunger Vital Sign    Worried About Running Out of Food in the Last Year: Never true    Ran Out of Food in the Last Year: Never true  Transportation Needs: No Transportation Needs (08/12/2023)   PRAPARE - Administrator, Civil Service (Medical): No    Lack of Transportation (Non-Medical): No  Physical Activity: Inactive (10/20/2022)   Exercise Vital Sign    Days of Exercise per Week: 0 days    Minutes of Exercise per Session: 0 min  Stress: No Stress Concern Present (10/20/2022)   [employer] of Occupational Health - Occupational Stress Questionnaire    Feeling of Stress : Not at all  Social Connections: Socially Integrated (08/09/2023)   Social Connection and Isolation Panel [NHANES]    Frequency of Communication with Friends and Family: More than three times a week    Frequency of Social Gatherings with Friends and Family: Once a week    Attends Religious  Services: More than 4 times per year    Active Member of [employer] or Organizations: Yes    Attends Banker Meetings: 1 to 4 times per year    Marital Status: Married  Catering manager Violence: Not At Risk (08/12/2023)   Humiliation, Afraid, Rape, and Kick questionnaire    Fear of Current or Ex-Partner: No    Emotionally Abused: No    Physically Abused: No    Sexually Abused: No    Family History  Problem Relation Age of Onset   Cancer Maternal Aunt        breast   Pancreatic cancer Paternal Aunt    Prostate cancer Paternal Uncle    Heart disease Maternal Grandmother 53   Heart disease Maternal Grandfather        fluid / CHF    Heart disease Paternal Grandfather 81   Heart attack Paternal Grandfather        passed at 27   Prostate cancer Father    Coronary artery disease Father 65       CABG   Hypertension Mother    Hyperlipidemia Mother    Other Daughter        accident with tornado 1996/11/10   Hypertension Son    Colon cancer Neg Hx    Colon polyps Neg Hx    Esophageal cancer Neg Hx    Rectal cancer Neg  Hx    Stomach cancer Neg Hx        Objective: Vitals:   10/13/23 1353  BP: (!) 152/71  Pulse: 82      Physical Exam Vitals reviewed.  Constitutional:      Appearance: Normal appearance.  Neurological:     Mental Status: He is alert.     Lab Results:  Results for orders placed or performed in visit on 10/13/23 (from the past 24 hours)  Urinalysis, Routine w reflex microscopic     Status: Abnormal   Collection Time: 10/13/23  1:53 PM  Result Value Ref Range   Specific Gravity, UA 1.020 1.005 - 1.030   pH, UA 6.5 5.0 - 7.5   Color, UA Yellow Yellow   Appearance Ur Clear Clear   Leukocytes,UA Negative Negative   Protein,UA Negative Negative/Trace   Glucose, UA 1+ (A) Negative   Ketones, UA 1+ (A) Negative   RBC, UA Negative Negative   Bilirubin, UA Negative Negative   Urobilinogen, Ur 1.0 0.2 - 1.0 mg/dL   Nitrite, UA Negative Negative    Microscopic Examination Comment    Narrative   Performed at:  [address] [facility] [address] [place], [place]  [id number] Lab Director: Chinita Pester MT, Phone:  [phone]       BMET No results for input(s): "NA", "K", "CL", "CO2", "GLUCOSE", "BUN", "CREATININE", "CALCIUM" in the last 72 hours. PSA PSA  Date Value Ref Range Status  06/25/2014 <0.1 0.0 - 4.0 ng/mL Final    Comment:    Roche ECLIA methodology. According to the American Urological Association, Serum PSA should decrease and remain at undetectable levels after radical prostatectomy. The AUA defines biochemical recurrence as an initial PSA value 0.2 ng/mL or greater followed by a subsequent confirmatory PSA value 0.2 ng/mL or greater. Values obtained with different assay methods or kits cannot be used interchangeably. Results cannot be interpreted as absolute evidence of the presence or absence of malignant disease.   06/12/2013 <0.1 0.0 - 4.0 ng/mL Final    Comment:    Roche ECLIA methodology. According to the American Urological Association, Serum PSA should decrease and remain at undetectable levels after radical prostatectomy. The AUA defines biochemical recurrence as an initial PSA value 0.2 ng/mL or greater followed by a subsequent confirmatory PSA value 0.2 ng/mL or greater. Values obtained with different assay methods or kits cannot be used interchangeably. Results cannot be interpreted as absolute evidence of the presence or absence of malignant disease.   Testosterone  Date Value Ref Range Status  10/06/2023 29 (L) 264 - 916 ng/dL Final    Comment:    Adult male reference interval is based on a population of healthy nonobese males (BMI <30) between 68 and 34 years old. Travison, et.al. JCEM [id number]. PMID: 32440102.   09/22/2022 23 (L) 264 - 916 ng/dL Final    Comment:    Adult male reference interval is based on a population of healthy nonobese males (BMI <30) between 93  and 66 years old. Travison, et.al. JCEM [phone]. PMID: 95638756.   08/27/2020 31 (L) 264 - 916 ng/dL Final    Comment:    Adult male reference interval is based on a population of healthy nonobese males (BMI <30) between 85 and 43 years old. Travison, et.al. JCEM [phone]. PMID: 30160109.     Lab Results  Component Value Date   PSA1 <0.1 10/06/2023   PSA1 <0.1 09/22/2022   PSA1 <0.1 08/26/2021    UA is  clear.  I have reviewed his CMP and CBC and he has a stable chronic anemia with a HGB of 12.2 and he has a stable elevation of his AST at 52.  His Hgb A1c is 6.6.    Studies/Results: Prior records reviewed.     Assessment & Plan: 1. History of prostate cancer.   His PSA remains undetectible.    2. SUI.  He has minimal  leakage.   3. History of stones.  He has had no stone symptoms.  CT in 2/21 was clear.  UA is clear.   4. Hypogonadism.   His T is only 29.  I think he would benefit from TRT and reviewed the risks in detail.  I will get him started on testosterone gel and repeat labs in a month and 3 months with an OV.    No orders of the defined types were placed in this encounter.    Orders Placed This Encounter  Procedures   Urinalysis, Routine w reflex microscopic   PSA    Standing Status:   Future    Expiration Date:   10/12/2024   Testosterone    Standing Status:   Future    Expiration Date:   10/12/2024      Return in about 1 year (around 10/12/2024) for He would like to f/u with me in [place]. .   CC: Dettinger, Elige Radon, MD      Bjorn Pippin 10/14/2023

## 2023-10-29 ENCOUNTER — Other Ambulatory Visit: Payer: Self-pay | Admitting: Family Medicine

## 2023-10-29 DIAGNOSIS — E1165 Type 2 diabetes mellitus with hyperglycemia: Secondary | ICD-10-CM

## 2023-11-08 ENCOUNTER — Other Ambulatory Visit (HOSPITAL_COMMUNITY): Payer: Self-pay | Admitting: Psychiatry

## 2023-11-08 MED ORDER — LORAZEPAM 0.5 MG PO TABS: 0.5000 mg | ORAL_TABLET | Freq: Two times a day (BID) | ORAL | 2 refills | Status: DC

## 2023-11-28 ENCOUNTER — Ambulatory Visit (INDEPENDENT_AMBULATORY_CARE_PROVIDER_SITE_OTHER): Payer: Medicare Other | Admitting: Family Medicine

## 2023-11-28 ENCOUNTER — Encounter: Payer: Self-pay | Admitting: Family Medicine

## 2023-11-28 VITALS — BP 132/75 | HR 96 | Temp 97.8°F | Ht 63.0 in | Wt 173.8 lb

## 2023-11-28 DIAGNOSIS — E1159 Type 2 diabetes mellitus with other circulatory complications: Secondary | ICD-10-CM

## 2023-11-28 DIAGNOSIS — I152 Hypertension secondary to endocrine disorders: Secondary | ICD-10-CM | POA: Diagnosis not present

## 2023-11-28 DIAGNOSIS — E1169 Type 2 diabetes mellitus with other specified complication: Secondary | ICD-10-CM | POA: Diagnosis not present

## 2023-11-28 DIAGNOSIS — E785 Hyperlipidemia, unspecified: Secondary | ICD-10-CM

## 2023-11-28 DIAGNOSIS — I10 Essential (primary) hypertension: Secondary | ICD-10-CM | POA: Diagnosis not present

## 2023-11-28 DIAGNOSIS — Z7984 Long term (current) use of oral hypoglycemic drugs: Secondary | ICD-10-CM

## 2023-11-28 DIAGNOSIS — E1165 Type 2 diabetes mellitus with hyperglycemia: Secondary | ICD-10-CM | POA: Diagnosis not present

## 2023-11-28 LAB — CBC WITH DIFFERENTIAL/PLATELET
Basophils Absolute: 0.1 10*3/uL (ref 0.0–0.2)
Basos: 1 %
EOS (ABSOLUTE): 0.2 10*3/uL (ref 0.0–0.4)
Eos: 2 %
Hematocrit: 39.8 % (ref 37.5–51.0)
Hemoglobin: 13.1 g/dL (ref 13.0–17.7)
Immature Grans (Abs): 0 10*3/uL (ref 0.0–0.1)
Immature Granulocytes: 0 %
Lymphocytes Absolute: 2 10*3/uL (ref 0.7–3.1)
Lymphs: 21 %
MCH: 33.2 pg — ABNORMAL HIGH (ref 26.6–33.0)
MCHC: 32.9 g/dL (ref 31.5–35.7)
MCV: 101 fL — ABNORMAL HIGH (ref 79–97)
Monocytes Absolute: 0.7 10*3/uL (ref 0.1–0.9)
Monocytes: 8 %
Neutrophils Absolute: 6.5 10*3/uL (ref 1.4–7.0)
Neutrophils: 68 %
Platelets: 320 10*3/uL (ref 150–450)
RBC: 3.94 x10E6/uL — ABNORMAL LOW (ref 4.14–5.80)
RDW: 12.3 % (ref 11.6–15.4)
WBC: 9.5 10*3/uL (ref 3.4–10.8)

## 2023-11-28 LAB — BAYER DCA HB A1C WAIVED: HB A1C (BAYER DCA - WAIVED): 7.2 % — ABNORMAL HIGH (ref 4.8–5.6)

## 2023-11-28 NOTE — Progress Notes (Signed)
 BP 132/75   Pulse 96   Temp 97.8 F (36.6 C)   Ht 5\' 3"  (1.6 m)   Wt 173 lb 12.8 oz (78.8 kg)   SpO2 98%   BMI 30.79 kg/m    Subjective:   Patient ID: George Wang, male    DOB: 12-Oct-1945, 78 y.o.   MRN: [id number]  HPI: George Wang is a 78 y.o. male presenting on 11/28/2023 for Medical Management of Chronic Issues (Patients right leg swells sometimes)   HPI Type 2 diabetes mellitus Patient comes in today for recheck of his diabetes. Patient has been currently taking metformin . Patient is currently on an ACE inhibitor/ARB. Patient has not seen an ophthalmologist this year. Patient denies any new issues with their feet. The symptom started onset as an adult hypertension and hyperlipidemia ARE RELATED TO DM   Hypertension Patient is currently on amlodipine  and chlorthalidone  and losartan , and their blood pressure today is 132/75. Patient denies any lightheadedness or dizziness. Patient denies headaches, blurred vision, chest pains, shortness of breath, or weakness. Denies any side effects from medication and is content with current medication.   Hyperlipidemia Patient is coming in for recheck of his hyperlipidemia. The patient is currently taking atorvastatin  and fish oils. They deny any issues with myalgias or history of liver damage from it. They deny any focal numbness or weakness or chest pain.   Relevant past medical, surgical, family and social history reviewed and updated as indicated. Interim medical history since our last visit reviewed. Allergies and medications reviewed and updated.  Review of Systems  Constitutional:  Negative for chills and fever.  Eyes:  Negative for visual disturbance.  Respiratory:  Negative for shortness of breath and wheezing.   Cardiovascular:  Negative for chest pain and leg swelling.  Musculoskeletal:  Negative for back pain and gait problem.  Skin:  Negative for rash.  Neurological:  Negative for dizziness and light-headedness.  All  other systems reviewed and are negative.   Per HPI unless specifically indicated above   Allergies as of 11/28/2023   No Known Allergies      Medication List        Accurate as of Nov 28, 2023 11:20 AM. If you have any questions, ask your nurse or doctor.          acetaminophen  325 MG tablet Commonly known as: TYLENOL  Take 2 tablets (650 mg total) by mouth 3 (three) times daily.   amLODipine  10 MG tablet Commonly known as: NORVASC  TAKE ONE (1) TABLET EACH DAY   atorvastatin  80 MG tablet Commonly known as: LIPITOR TAKE 1/2 TABLET DAILY AT 6PM   beta carotene 25000 UNIT capsule Take 25,000 Units by mouth daily.   blood glucose meter kit and supplies Dispense based on patient and insurance preference. Use up to four times daily as directed. (FOR ICD-10 E10.9, E11.9).   busPIRone  15 MG tablet Commonly known as: BUSPAR  2 qam   2  qhs   CENTRUM SILVER PO Take 1 tablet by mouth daily.   chlorthalidone  25 MG tablet Commonly known as: HYGROTON  Take 0.5 tablets (12.5 mg total) by mouth daily.   fish oil-omega-3 fatty acids 1000 MG capsule Take 1 g by mouth 2 (two) times daily.   ibuprofen  600 MG tablet Commonly known as: ADVIL  Take 600 mg by mouth every 6 (six) hours as needed.   LORazepam  0.5 MG tablet Commonly known as: Ativan  Take 1 tablet (0.5 mg total) by mouth 2 (two) times daily.  2 tab q am, and 1 at bedtime prn   losartan  100 MG tablet Commonly known as: COZAAR  Take 1 tablet (100 mg total) by mouth daily.   metFORMIN  500 MG tablet Commonly known as: GLUCOPHAGE  Take 2 tablets (1,000 mg total) by mouth 2 (two) times daily with a meal.   mirtazapine  30 MG tablet Commonly known as: Remeron  1  qhs   mirtazapine  15 MG disintegrating tablet Commonly known as: REMERON  SOL-TAB Take one 15 mg tablet each morning.   omeprazole  20 MG capsule Commonly known as: PRILOSEC Take 1 capsule (20 mg total) by mouth daily.   OneTouch Ultra test strip Generic  drug: glucose blood TEST BLOOD SUGARS UP TO 2 TIMES DAILY DX E11.9   OT ULTRA/FASTTK CNTRL SOLN Soln USE WITH METER AS DIRECTED DX E11.9   Se-Tan PLUS  162-115.2-1 MG Caps TAKE ONE (1) CAPSULE BY MOUTH 2 TIMES DAILY   venlafaxine  XR 150 MG 24 hr capsule Commonly known as: Effexor  XR Take 2 capsules (300 mg total) with breakfast.   Vitamin D  50 MCG (2000 UT) Caps Take 1 capsule by mouth daily.         Objective:   BP 132/75   Pulse 96   Temp 97.8 F (36.6 C)   Ht 5\' 3"  (1.6 m)   Wt 173 lb 12.8 oz (78.8 kg)   SpO2 98%   BMI 30.79 kg/m   Wt Readings from Last 3 Encounters:  11/28/23 173 lb 12.8 oz (78.8 kg)  08/31/23 170 lb (77.1 kg)  08/18/23 177 lb (80.3 kg)    Physical Exam Vitals and nursing note reviewed.  Constitutional:      General: He is not in acute distress.    Appearance: He is well-developed. He is not diaphoretic.  Eyes:     General: No scleral icterus.    Conjunctiva/sclera: Conjunctivae normal.  Neck:     Thyroid : No thyromegaly.  Cardiovascular:     Rate and Rhythm: Normal rate and regular rhythm.     Heart sounds: Normal heart sounds. No murmur heard. Pulmonary:     Effort: Pulmonary effort is normal. No respiratory distress.     Breath sounds: Normal breath sounds. No wheezing.  Musculoskeletal:        General: No swelling. Normal range of motion.     Cervical back: Neck supple.  Lymphadenopathy:     Cervical: No cervical adenopathy.  Skin:    General: Skin is warm and dry.     Findings: No rash.  Neurological:     Mental Status: He is alert and oriented to person, place, and time.     Coordination: Coordination normal.  Psychiatric:        Behavior: Behavior normal.       Assessment & Plan:   Problem List Items Addressed This Visit       Cardiovascular and Mediastinum   Hypertension associated with diabetes (HCC)     Endocrine   Hyperlipidemia associated with type 2 diabetes mellitus (HCC)   Relevant Orders   Lipid  panel   Type 2 diabetes mellitus (HCC) - Primary   Relevant Orders   Bayer DCA Hb A1c Waived   CMP14+EGFR   Other Visit Diagnoses       Essential hypertension       Relevant Orders   CBC with Differential/Platelet     A1c is up a little bit at 7.2.  Mainly focus on diet.  Would not change medicines today but if it  keeps going up we will have to change in the future.  Blood pressure and everything looks decent today.  Follow up plan: Return in about 3 months (around 02/28/2024), or if symptoms worsen or fail to improve, for Diabetes recheck.  Counseling provided for all of the vaccine components Orders Placed This Encounter  Procedures   Bayer DCA Hb A1c Waived   CBC with Differential/Platelet   CMP14+EGFR   Lipid panel    Jolyne Needs, MD [facility] 11/28/2023, 11:20 AM

## 2023-11-29 LAB — MICROALBUMIN / CREATININE URINE RATIO
Creatinine, Urine: 260.1 mg/dL
Microalb/Creat Ratio: 153 mg/g{creat} — ABNORMAL HIGH (ref 0–29)
Microalbumin, Urine: 397.5 ug/mL

## 2023-12-07 ENCOUNTER — Other Ambulatory Visit: Payer: Self-pay

## 2023-12-07 ENCOUNTER — Ambulatory Visit (HOSPITAL_COMMUNITY): Payer: Medicare Other | Admitting: Psychiatry

## 2023-12-07 ENCOUNTER — Ambulatory Visit: Payer: Self-pay | Admitting: Family Medicine

## 2023-12-07 ENCOUNTER — Encounter (HOSPITAL_COMMUNITY): Payer: Self-pay | Admitting: Psychiatry

## 2023-12-07 VITALS — BP 133/66 | HR 89 | Ht 63.0 in | Wt 175.0 lb

## 2023-12-07 DIAGNOSIS — F325 Major depressive disorder, single episode, in full remission: Secondary | ICD-10-CM | POA: Diagnosis not present

## 2023-12-07 MED ORDER — VENLAFAXINE HCL ER 150 MG PO CP24: ORAL_CAPSULE | ORAL | 5 refills | Status: DC

## 2023-12-07 MED ORDER — LORAZEPAM 0.5 MG PO TABS: 0.5000 mg | ORAL_TABLET | Freq: Two times a day (BID) | ORAL | 2 refills | Status: DC

## 2023-12-07 MED ORDER — MIRTAZAPINE 15 MG PO TBDP: ORAL_TABLET | ORAL | 5 refills | Status: DC

## 2023-12-07 MED ORDER — MIRTAZAPINE 30 MG PO TABS: ORAL_TABLET | ORAL | 5 refills | Status: DC

## 2023-12-07 NOTE — Progress Notes (Signed)
 Psychiatric Initial Adult Assessment   Patient Identification: George Wang MRN:  [id number] Date of Evaluation:  12/07/2023 Referral Source: Community/emergency room Chief Complaint:   Visit Diagnosis: Major depression  History of Present Illness:     Today the patient is doing well.  He says he is no different than he was but his last visit.  On the other hand he is not working anymore.  He does a lot of things around the house.  Once again he describes himself is that when he feels down he will take the 2 Ativan  is in the morning and he feels much better.  He then gets active and starts doing things and he feels even better.  He does a good church much but he listens to a church television so every Sunday morning.  His health is actually very good.  His relationship with his wife is good.  He has a daughter who is 40 years old.  The patient takes his medicines as prescribed.  He drinks no alcohol and uses no drugs.  He says it takes some time to get going in the morning but once he has his juice and coffee. Depression Symptoms:  depressed mood, (Hypo) Manic Symptoms:   Anxiety Symptoms:  Excessive Worry, Psychotic Symptoms:   PTSD Symptoms:   Past Psychiatric History: 1 psychiatric hospitalization in 1989 presently on Lexapro  10 mg, BuSpar  30 mg  Previous Psychotropic Medications: Yes   Substance Abuse History in the last 12 months:  No.  Consequences of Substance Abuse:   Past Medical History:  Past Medical History:  Diagnosis Date   Allergy    mild   Anemia    Iron deficiency   Anxiety state, unspecified    Aortic atherosclerosis (HCC)    Arthritis    Calculus of kidney    kidney stones  - normal BMP    Cancer (HCC)    Prostate, skin cancer x 2    Cataract    forming both eyes   Depressive disorder, not elsewhere classified    Diverticulosis of colon (without mention of hemorrhage)    GERD (gastroesophageal reflux disease)    on nexium   Gout, unspecified     Heart murmur    Hyperlipidemia    Hypertension, essential    Irritable bowel syndrome    Neuromuscular disorder (HCC)    numbness in knees from back issues    Personal history of malignant neoplasm of prostate    PVC (premature ventricular contraction)    Type II or unspecified type diabetes mellitus without mention of complication, not stated as uncontrolled     Past Surgical History:  Procedure Laterality Date   COLONOSCOPY     HERNIA REPAIR     left side   POLYPECTOMY     PROSTATE SURGERY     skin cancer removed     chest x 2 3-4 yrs ago    TONSILLECTOMY      Family Psychiatric History:   Family History:  Family History  Problem Relation Age of Onset   Cancer Maternal Aunt        breast   Pancreatic cancer Paternal Aunt    Prostate cancer Paternal Uncle    Heart disease Maternal Grandmother 35   Heart disease Maternal Grandfather        fluid / CHF    Heart disease Paternal Grandfather 107   Heart attack Paternal Grandfather        passed at 33  Prostate cancer Father    Coronary artery disease Father 13       CABG   Hypertension Mother    Hyperlipidemia Mother    Other Daughter        accident with tornado 1996-12-28   Hypertension Son    Colon cancer Neg Hx    Colon polyps Neg Hx    Esophageal cancer Neg Hx    Rectal cancer Neg Hx    Stomach cancer Neg Hx     Social History:   Social History   Socioeconomic History   Marital status: Married    Spouse name: Haskell Linker   Number of children: 2   Years of education: 13   Highest education level: Some college, no degree  Occupational History   Occupation: Charity fundraiser    Comment: Part time  Tobacco Use   Smoking status: Former    Current packs/day: 0.00    Average packs/day: 1 pack/day for 19.0 years (19.0 ttl pk-yrs)    Types: Cigarettes    Start date: 10/13/1959    Quit date: 10/13/1978    Years since quitting: 45.1   Smokeless tobacco: Former    Types: Snuff    Quit date: 05/30/2008   Vaping Use   Vaping status: Never Used  Substance and Sexual Activity   Alcohol use: Not Currently   Drug use: No   Sexual activity: Yes  Other Topics Concern   Not on file  Social History Narrative   Lives at home with wife.  He has one son and a daughter that died in a tornado in 13. 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 He has one living grandson and one grandson that passed away in 12-28-2016 at age 74 from cancer.   Social Drivers of Corporate investment banker Strain: Low Risk  (10/20/2022)   Overall Financial Resource Strain (CARDIA)    Difficulty of Paying Living Expenses: Not hard at all  Food Insecurity: No Food Insecurity (08/12/2023)   Hunger Vital Sign    Worried About Running Out of Food in the Last Year: Never true    Ran Out of Food in the Last Year: Never true  Transportation Needs: No Transportation Needs (08/12/2023)   PRAPARE - Administrator, Civil Service (Medical): No    Lack of Transportation (Non-Medical): No  Physical Activity: Inactive (10/20/2022)   Exercise Vital Sign    Days of Exercise per Week: 0 days    Minutes of Exercise per Session: 0 min  Stress: No Stress Concern Present (10/20/2022)   [employer] of Occupational Health - Occupational Stress Questionnaire    Feeling of Stress : Not at all  Social Connections: Socially Integrated (08/09/2023)   Social Connection and Isolation Panel [NHANES]    Frequency of Communication with Friends and Family: More than three times a week    Frequency of Social Gatherings with Friends and Family: Once a week    Attends Religious Services: More than 4 times per year    Active Member of [employer] or Organizations: Yes    Attends Banker Meetings: 1 to 4 times per year    Marital Status: Married    Additional Social History:   Allergies:  No Known Allergies  Metabolic Disorder Labs: Lab Results  Component Value Date   HGBA1C 7.2 (H) 11/28/2023   No results found for:  "PROLACTIN" Lab Results  Component Value Date   CHOL 130 05/30/2023   TRIG  88 05/30/2023   HDL 40 05/30/2023   CHOLHDL 3.3 05/30/2023   LDLCALC 73 05/30/2023   LDLCALC 45 02/21/2023   Lab Results  Component Value Date   TSH 1.330 12/05/2018    Therapeutic Level Labs: No results found for: "LITHIUM" No results found for: "CBMZ" No results found for: "VALPROATE"  Current Medications: Current Outpatient Medications  Medication Sig Dispense Refill   acetaminophen  (TYLENOL ) 325 MG tablet Take 2 tablets (650 mg total) by mouth 3 (three) times daily.     amLODipine  (NORVASC ) 10 MG tablet TAKE ONE (1) TABLET EACH DAY 90 tablet 3   atorvastatin  (LIPITOR) 80 MG tablet TAKE 1/2 TABLET DAILY AT 6PM 45 tablet 3   beta carotene 25000 UNIT capsule Take 25,000 Units by mouth daily.     Blood Glucose Calibration (OT ULTRA/FASTTK CNTRL SOLN) SOLN USE WITH METER AS DIRECTED DX E11.9 1 each 1   blood glucose meter kit and supplies Dispense based on patient and insurance preference. Use up to four times daily as directed. (FOR ICD-10 E10.9, E11.9). 1 each 1   busPIRone  (BUSPAR ) 15 MG tablet 2 qam   2  qhs 120 tablet 5   chlorthalidone  (HYGROTON ) 25 MG tablet Take 0.5 tablets (12.5 mg total) by mouth daily. 45 tablet 3   Cholecalciferol (VITAMIN D ) 2000 UNITS CAPS Take 1 capsule by mouth daily.     FeFum-FePo-FA-B Cmp-C-Zn-Mn-Cu (SE-TAN PLUS ) 162-115.2-1 MG CAPS TAKE ONE (1) CAPSULE BY MOUTH 2 TIMES DAILY 180 capsule 3   fish oil-omega-3 fatty acids 1000 MG capsule Take 1 g by mouth 2 (two) times daily.      glucose blood (ONETOUCH ULTRA) test strip TEST BLOOD SUGARS UP TO 2 TIMES DAILY DX E11.9 200 strip 3   ibuprofen  (ADVIL ) 600 MG tablet Take 600 mg by mouth every 6 (six) hours as needed.     losartan  (COZAAR ) 100 MG tablet Take 1 tablet (100 mg total) by mouth daily. 90 tablet 3   metFORMIN  (GLUCOPHAGE ) 500 MG tablet Take 2 tablets (1,000 mg total) by mouth 2 (two) times daily with a meal. 360  tablet 3   Multiple Vitamins-Minerals (CENTRUM SILVER PO) Take 1 tablet by mouth daily.      omeprazole  (PRILOSEC) 20 MG capsule Take 1 capsule (20 mg total) by mouth daily. 90 capsule 3   LORazepam  (ATIVAN ) 0.5 MG tablet Take 1 tablet (0.5 mg total) by mouth 2 (two) times daily. 2 tab q am, and 1 at bedtime prn 120 tablet 2   mirtazapine  (REMERON  SOL-TAB) 15 MG disintegrating tablet Take one 15 mg tablet each morning. 30 tablet 5   mirtazapine  (REMERON ) 30 MG tablet 1  qhs 30 tablet 5   venlafaxine  XR (EFFEXOR  XR) 150 MG 24 hr capsule Take 2 capsules (300 mg total) with breakfast. 60 capsule 5   No current facility-administered medications for this visit.    Musculoskeletal: Strength & Muscle Tone: within normal limits Gait & Station: normal Patient leans: N/A  Psychiatric Specialty Exam: Review of Systems  Blood pressure 133/66, pulse 89, height 5\' 3"  (1.6 m), weight 175 lb (79.4 kg).Body mass index is 31 kg/m.  General Appearance: Casual  Eye Contact:  Good  Speech:  Clear and Coherent  Volume:  Normal  Mood:  Depressed  Affect:  Appropriate  Thought Process:  Coherent  Orientation:  Full (Time, Place, and Person)  Thought Content:  WDL  Suicidal Thoughts:  No  Homicidal Thoughts:  No  Memory:  Negative  Judgement:  Good  Insight:  Good  Psychomotor Activity:  Normal  Concentration:    Recall:  Good  Fund of Knowledge:Fair  Language: Good  Akathisia:  No  Handed:  Right  AIMS (if indicated):  not done  Assets:  Desire for Improvement  ADL's:  Intact  Cognition: WNL  Sleep:  Good   Screenings: GAD-7    Flowsheet Row Office Visit from 11/28/2023 in [facility] Office Visit from 08/31/2023 in [facility] Office Visit from 08/08/2023 in [facility] Office Visit from 05/30/2023 in [facility] Office Visit from 02/21/2023 in [facility]  Total GAD-7 Score 5 7 7 10 6       Mini-Mental    Flowsheet Row Clinical Support from 05/16/2017 in [facility]  Total Score (max 30 points ) 29      PHQ2-9    Flowsheet Row Office Visit from 11/28/2023 in [facility] Office Visit from 08/31/2023 in [facility] Office Visit from 08/18/2023 in [facility] Office Visit from 08/08/2023 in [facility] Office Visit from 05/30/2023 in [facility]  PHQ-2 Total Score 1 2 2 2 4   PHQ-9 Total Score 3 7 -- 6 6      Flowsheet Row ED to Hosp-Admission (Discharged) from 08/08/2023 in [facility] from 08/06/2023 in [facility] Department [facility]  C-SSRS RISK CATEGORY No Risk No Risk       Assessment and Plan:  5/21/20253:10 PM    Today the patient is stable.  He will continue taking 300 mg of Effexor  Remeron  15 mg in the morning and 30 mg at night.  He takes Ativan  0.5 mg 2 in the morning and then 2 later in the day.  He still takes BuSpar  30 mg twice daily.  Overall he has very few vegetative symptoms.  He is functioning very well.  Return to see me in 4 months.  He is very stable.

## 2024-01-12 DIAGNOSIS — Z08 Encounter for follow-up examination after completed treatment for malignant neoplasm: Secondary | ICD-10-CM | POA: Diagnosis not present

## 2024-01-12 DIAGNOSIS — L570 Actinic keratosis: Secondary | ICD-10-CM | POA: Diagnosis not present

## 2024-01-12 DIAGNOSIS — Z85828 Personal history of other malignant neoplasm of skin: Secondary | ICD-10-CM | POA: Diagnosis not present

## 2024-01-12 DIAGNOSIS — X32XXXD Exposure to sunlight, subsequent encounter: Secondary | ICD-10-CM | POA: Diagnosis not present

## 2024-02-03 ENCOUNTER — Other Ambulatory Visit: Payer: Self-pay | Admitting: Family Medicine

## 2024-02-03 DIAGNOSIS — I10 Essential (primary) hypertension: Secondary | ICD-10-CM

## 2024-02-03 DIAGNOSIS — E782 Mixed hyperlipidemia: Secondary | ICD-10-CM

## 2024-02-14 ENCOUNTER — Telehealth: Payer: Self-pay | Admitting: Family Medicine

## 2024-02-14 NOTE — Telephone Encounter (Signed)
 Patient brought in letter that he received from [facility] insurance stating that beginning on 02/19/2024 his insurance would no longer cover the One Touch meters and test strips and needs Accu-Check meter and Accu-check guide test strips to pharmacy.   [facility] uses [facility] in [place].

## 2024-02-15 MED ORDER — LANCETS MISC. MISC: 1.0000 | Freq: Three times a day (TID) | 0 refills | Status: AC

## 2024-02-15 MED ORDER — BLOOD GLUCOSE MONITORING SUPPL DEVI: 1.0000 | Freq: Three times a day (TID) | 0 refills | Status: AC

## 2024-02-15 MED ORDER — BLOOD GLUCOSE TEST VI STRP: 1.0000 | ORAL_STRIP | Freq: Three times a day (TID) | 0 refills | Status: AC

## 2024-02-15 NOTE — Telephone Encounter (Signed)
 Please go ahead and send an Accu-Chek meter for the patient, he checks twice a day 43-month supply with 3 refills

## 2024-02-15 NOTE — Telephone Encounter (Signed)
 Accu chek Glucometer, lancets, test strips sent to [facility] in [place]. Pt aware.

## 2024-02-29 ENCOUNTER — Ambulatory Visit: Admitting: Family Medicine

## 2024-02-29 ENCOUNTER — Encounter: Payer: Self-pay | Admitting: Family Medicine

## 2024-02-29 VITALS — BP 148/74 | HR 87 | Ht 63.0 in | Wt 172.0 lb

## 2024-02-29 DIAGNOSIS — E1169 Type 2 diabetes mellitus with other specified complication: Secondary | ICD-10-CM

## 2024-02-29 DIAGNOSIS — I152 Hypertension secondary to endocrine disorders: Secondary | ICD-10-CM

## 2024-02-29 DIAGNOSIS — E1165 Type 2 diabetes mellitus with hyperglycemia: Secondary | ICD-10-CM

## 2024-02-29 DIAGNOSIS — E785 Hyperlipidemia, unspecified: Secondary | ICD-10-CM | POA: Diagnosis not present

## 2024-02-29 DIAGNOSIS — E1159 Type 2 diabetes mellitus with other circulatory complications: Secondary | ICD-10-CM

## 2024-02-29 LAB — BAYER DCA HB A1C WAIVED: HB A1C (BAYER DCA - WAIVED): 6.6 % — ABNORMAL HIGH (ref 4.8–5.6)

## 2024-02-29 NOTE — Progress Notes (Signed)
 BP (!) 148/74   Pulse 87   Ht 5' 3 (1.6 m)   Wt 172 lb (78 kg)   SpO2 95%   BMI 30.47 kg/m    Subjective:   Patient ID: George Wang, male    DOB: [birth year], 78 y.o.   MRN: [id number]  HPI: George Wang is a 78 y.o. male presenting on 02/29/2024 for Medical Management of Chronic Issues and Diabetes   Discussed the use of AI scribe software for clinical note transcription with the patient, who gave verbal consent to proceed.  History of Present Illness   George Wang is an 78 year old male with diabetes, hypertension, and hyperlipidemia who presents for a diabetes recheck.  His blood sugars have been improving, with occasional readings in the 90s, more frequently in the 120s, and some higher readings in the 140s and 170s. His A1c is 6.6, which he is pleased with, as he was hoping it would be below 7. He has had past issues with sugar intake affecting his blood sugar levels.  He is taking losartan , chlorthalidone , and amlodipine  for hypertension. His home blood pressure readings are generally better than those taken in the office. 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 Occasionally, his blood pressure reads in the 145 range but decreases upon rechecking.  For hyperlipidemia, he continues to take atorvastatin  and fish oils. He is unsure about interpreting his lab results and requests a printout of his last readings.  He experiences swelling in his legs, which he manages by elevating them at night. The swelling typically resolves by morning and is associated with the summertime. It used to occur in both legs.  He has a history of anxiety, which he manages with medication prescribed by Dr. Wynetta. The anxiety bothers him but does not control him.  He was informed by a nurse about proteinuria, which he attributes to his diabetes. He stays hydrated by drinking three to four bottles of water a day and is concerned about the protein leakage and its  implications.       Relevant past medical, surgical, family and social history reviewed and updated as indicated. Interim medical history since our last visit reviewed. Allergies and medications reviewed and updated.  Review of Systems  Constitutional:  Negative for chills and fever.  Eyes:  Negative for visual disturbance.  Respiratory:  Negative for shortness of breath and wheezing.   Cardiovascular:  Negative for chest pain and leg swelling.  Musculoskeletal:  Negative for back pain and gait problem.  Skin:  Negative for rash.  Neurological:  Negative for dizziness and light-headedness.  All other systems reviewed and are negative.   Per HPI unless specifically indicated above   Allergies as of 02/29/2024   No Known Allergies      Medication List        Accurate as of February 29, 2024 11:48 AM. If you have any questions, ask your nurse or doctor.          STOP taking these medications    simvastatin 80 MG tablet Commonly known as: ZOCOR Stopped by: George Wang       TAKE these medications    acetaminophen  325 MG tablet Commonly known as: TYLENOL  Take 2 tablets (650 mg total) by mouth 3 (three) times daily.   amLODipine  10 MG tablet Commonly known as: NORVASC  TAKE ONE (1) TABLET EACH DAY   atorvastatin  80 MG tablet Commonly known as: LIPITOR TAKE 1/2  TABLET DAILY AT 6PM   beta carotene 25000 UNIT capsule Take 25,000 Units by mouth daily.   blood glucose meter kit and supplies Dispense based on patient and insurance preference. Use up to four times daily as directed. (FOR ICD-10 E10.9, E11.9).   Blood Glucose Monitoring Suppl Devi 1 each by Does not apply route in the morning, at noon, and at bedtime. May substitute to any manufacturer covered by patient's insurance.   BLOOD GLUCOSE TEST STRIPS Strp 1 each by In Vitro route in the morning, at noon, and at bedtime. May substitute to any manufacturer covered by patient's insurance.    busPIRone  15 MG tablet Commonly known as: BUSPAR  2 qam   2  qhs   CENTRUM SILVER PO Take 1 tablet by mouth daily.   chlorthalidone  25 MG tablet Commonly known as: HYGROTON  Take 0.5 tablets (12.5 mg total) by mouth daily.   fish oil-omega-3 fatty acids 1000 MG capsule Take 1 g by mouth 2 (two) times daily.   ibuprofen  600 MG tablet Commonly known as: ADVIL  Take 600 mg by mouth every 6 (six) hours as needed.   Lancets Misc. Misc 1 each by Does not apply route in the morning, at noon, and at bedtime. May substitute to any manufacturer covered by patient's insurance.   LORazepam  0.5 MG tablet Commonly known as: Ativan  Take 1 tablet (0.5 mg total) by mouth 2 (two) times daily. 2 tab q am, and 1 at bedtime prn   losartan  100 MG tablet Commonly known as: COZAAR  Take 1 tablet (100 mg total) by mouth daily.   metFORMIN  500 MG tablet Commonly known as: GLUCOPHAGE  Take 2 tablets (1,000 mg total) by mouth 2 (two) times daily with a meal.   mirtazapine  30 MG tablet Commonly known as: Remeron  1  qhs   mirtazapine  15 MG disintegrating tablet Commonly known as: REMERON  SOL-TAB Take one 15 mg tablet each morning.   omeprazole  20 MG capsule Commonly known as: PRILOSEC TAKE ONE CAPSULE BY MOUTH DAILY   OT ULTRA/FASTTK CNTRL SOLN Soln USE WITH METER AS DIRECTED DX E11.9   Se-Tan PLUS  162-115.2-1 MG Caps TAKE ONE (1) CAPSULE BY MOUTH 2 TIMES DAILY   venlafaxine  XR 150 MG 24 hr capsule Commonly known as: Effexor  XR Take 2 capsules (300 mg total) with breakfast.   Vitamin D  50 MCG (2000 UT) Caps Take 1 capsule by mouth daily.         Objective:   BP (!) 148/74   Pulse 87   Ht 5' 3 (1.6 m)   Wt 172 lb (78 kg)   SpO2 95%   BMI 30.47 kg/m   Wt Readings from Last 3 Encounters:  02/29/24 172 lb (78 kg)  11/28/23 173 lb 12.8 oz (78.8 kg)  08/31/23 170 lb (77.1 kg)    Physical Exam Vitals and nursing note reviewed.  Constitutional:      General: He is not in acute  distress.    Appearance: He is well-developed. He is not diaphoretic.  Eyes:     General: No scleral icterus.    Conjunctiva/sclera: Conjunctivae normal.  Neck:     Thyroid : No thyromegaly.  Cardiovascular:     Rate and Rhythm: Normal rate and regular rhythm.     Heart sounds: Normal heart sounds. No murmur heard. Pulmonary:     Effort: Pulmonary effort is normal. No respiratory distress.     Breath sounds: Normal breath sounds. No wheezing.  Musculoskeletal:  General: Normal range of motion.     Cervical back: Neck supple.  Lymphadenopathy:     Cervical: No cervical adenopathy.  Skin:    General: Skin is warm and dry.     Findings: No rash.  Neurological:     Mental Status: He is alert and oriented to person, place, and time.     Coordination: Coordination normal.  Psychiatric:        Mood and Affect: Mood is anxious.        Behavior: Behavior normal.    Physical Exam   VITALS: BP- 140/74 NECK: Thyroid  without nodules. CHEST: Lungs clear to auscultation. CARDIOVASCULAR: Heart regular rate and rhythm, no murmurs. EXTREMITIES: No edema in extremities. Pulses intact in feet.         Assessment & Plan:   Problem List Items Addressed This Visit       Cardiovascular and Mediastinum   Hypertension associated with diabetes (HCC)   Relevant Orders   Bayer DCA Hb A1c Waived   CBC with Differential/Platelet   CMP14+EGFR   Lipid panel     Endocrine   Hyperlipidemia associated with type 2 diabetes mellitus (HCC) - Primary   Relevant Orders   Bayer DCA Hb A1c Waived   CBC with Differential/Platelet   CMP14+EGFR   Lipid panel   Type 2 diabetes mellitus (HCC)        Type 2 diabetes mellitus with early diabetic nephropathy (proteinuria) Blood sugars controlled, A1c 6.6%. Proteinuria present, no significant kidney damage. On losartan  for renal protection. - Continue current diabetes management. - Maintain hydration with 3-4 bottles of water daily. - Recheck  urine protein levels in the future.  Hypertension Office BP 140/74 mmHg, home readings lower but occasionally 145 mmHg. On losartan , chlorthalidone , amlodipine . - Continue current antihypertensive medications. - Monitor blood pressure at home several times a week. - Maintain a log of blood pressure readings and bring to next visit.  Hyperlipidemia On atorvastatin  and fish oils, regimen effective. - Continue atorvastatin  and fish oils.  Anxiety disorder Anxiety bothersome but manageable. Under treatment with Dr. Wynetta. - Continue current anxiety management with Dr. Wynetta. - Consider adjustments to anxiety medication if needed.          Follow up plan: Return in about 3 months (around 05/31/2024), or if symptoms worsen or fail to improve, for Diabetes and hypertension and anxiety and hyperlipidemia.  Counseling provided for all of the vaccine components Orders Placed This Encounter  Procedures   Bayer DCA Hb A1c Waived   CBC with Differential/Platelet   CMP14+EGFR   Lipid panel    George Levins, MD [facility] 02/29/2024, 11:48 AM

## 2024-03-01 LAB — CMP14+EGFR
ALT: 16 IU/L (ref 0–44)
AST: 54 IU/L — ABNORMAL HIGH (ref 0–40)
Albumin: 4.5 g/dL (ref 3.8–4.8)
Alkaline Phosphatase: 80 IU/L (ref 44–121)
BUN/Creatinine Ratio: 14 (ref 10–24)
BUN: 17 mg/dL (ref 8–27)
Bilirubin Total: 0.6 mg/dL (ref 0.0–1.2)
CO2: 23 mmol/L (ref 20–29)
Calcium: 9.7 mg/dL (ref 8.6–10.2)
Chloride: 101 mmol/L (ref 96–106)
Creatinine, Ser: 1.23 mg/dL (ref 0.76–1.27)
Globulin, Total: 2.1 g/dL (ref 1.5–4.5)
Glucose: 148 mg/dL — ABNORMAL HIGH (ref 70–99)
Potassium: 4.7 mmol/L (ref 3.5–5.2)
Sodium: 141 mmol/L (ref 134–144)
Total Protein: 6.6 g/dL (ref 6.0–8.5)
eGFR: 59 mL/min/1.73 — ABNORMAL LOW (ref >59)

## 2024-03-01 LAB — CBC WITH DIFFERENTIAL/PLATELET
Basophils Absolute: 0.1 x10E3/uL (ref 0.0–0.2)
Basos: 1 %
EOS (ABSOLUTE): 0.2 x10E3/uL (ref 0.0–0.4)
Eos: 3 %
Hematocrit: 35.4 % — ABNORMAL LOW (ref 37.5–51.0)
Hemoglobin: 11.7 g/dL — ABNORMAL LOW (ref 13.0–17.7)
Immature Grans (Abs): 0 x10E3/uL (ref 0.0–0.1)
Immature Granulocytes: 0 %
Lymphocytes Absolute: 1.5 x10E3/uL (ref 0.7–3.1)
Lymphs: 22 %
MCH: 33.2 pg — ABNORMAL HIGH (ref 26.6–33.0)
MCHC: 33.1 g/dL (ref 31.5–35.7)
MCV: 101 fL — ABNORMAL HIGH (ref 79–97)
Monocytes Absolute: 0.8 x10E3/uL (ref 0.1–0.9)
Monocytes: 11 %
Neutrophils Absolute: 4.5 x10E3/uL (ref 1.4–7.0)
Neutrophils: 63 %
Platelets: 260 x10E3/uL (ref 150–450)
RBC: 3.52 x10E6/uL — ABNORMAL LOW (ref 4.14–5.80)
RDW: 12.1 % (ref 11.6–15.4)
WBC: 7.2 x10E3/uL (ref 3.4–10.8)

## 2024-03-01 LAB — LIPID PANEL
Chol/HDL Ratio: 2.8 ratio (ref 0.0–5.0)
Cholesterol, Total: 113 mg/dL (ref 100–199)
HDL: 41 mg/dL (ref >39)
LDL Chol Calc (NIH): 58 mg/dL (ref 0–99)
Triglycerides: 66 mg/dL (ref 0–149)
VLDL Cholesterol Cal: 14 mg/dL (ref 5–40)

## 2024-03-06 ENCOUNTER — Other Ambulatory Visit (HOSPITAL_COMMUNITY): Payer: Self-pay | Admitting: Psychiatry

## 2024-03-06 MED ORDER — LORAZEPAM 0.5 MG PO TABS: ORAL_TABLET | ORAL | 2 refills | Status: DC

## 2024-03-07 ENCOUNTER — Ambulatory Visit: Payer: Self-pay | Admitting: Family Medicine

## 2024-03-27 ENCOUNTER — Other Ambulatory Visit: Payer: Self-pay

## 2024-03-27 ENCOUNTER — Encounter (HOSPITAL_COMMUNITY): Payer: Self-pay | Admitting: Psychiatry

## 2024-03-27 ENCOUNTER — Ambulatory Visit (HOSPITAL_COMMUNITY): Admitting: Psychiatry

## 2024-03-27 VITALS — BP 147/72 | HR 96 | Ht 63.0 in | Wt 171.0 lb

## 2024-03-27 DIAGNOSIS — F325 Major depressive disorder, single episode, in full remission: Secondary | ICD-10-CM

## 2024-03-27 MED ORDER — BUSPIRONE HCL 15 MG PO TABS: ORAL_TABLET | ORAL | 5 refills | Status: AC

## 2024-03-27 MED ORDER — LORAZEPAM 0.5 MG PO TABS: ORAL_TABLET | ORAL | 4 refills | Status: AC

## 2024-03-27 MED ORDER — MIRTAZAPINE 15 MG PO TBDP: ORAL_TABLET | ORAL | 5 refills | Status: AC

## 2024-03-27 MED ORDER — MIRTAZAPINE 30 MG PO TABS: ORAL_TABLET | ORAL | 5 refills | Status: AC

## 2024-03-27 MED ORDER — VENLAFAXINE HCL ER 150 MG PO CP24: ORAL_CAPSULE | ORAL | 5 refills | Status: AC

## 2024-03-27 NOTE — Progress Notes (Signed)
 Psychiatric Initial Adult Assessment   Patient Identification: George Wang MRN:  [id number] Date of Evaluation:  03/27/2024 Referral Source: Community/emergency room Chief Complaint:   Visit Diagnosis: Major depression  History of Present Illness:     Today the patient is seen in the office.  The patient is actually doing extremely well.  His mood is good.  He clearly has a pattern of waking in the morning and feeling mildly dysphoric and then takes Ativan  0.5 mg 2 in the morning and begins to feel much better and as the day progresses gets even better.  He does a lot of yard work.  He works in his shop.  He says he listens to rock 'n' roll music specifically shag music.  The patient has a 20 year old dog who is not doing well.  The patient has a wedding coming up for his grandson.  Patient has a past history of difficult times.  His daughter was killed in a tornado that went through their small town in Apr 26, 1997.  Patient also had a grandson who died of cancer in 04-26-17.  The patient says he is handling these test fairly well.  The patient is functioning very well.  Has had no falls recently.  Year or so ago he did have a fall and broke some ribs.  But is recovered.  He is very cautious when he works at a shop.  He is sleeping and eating well.  He has got good energy.  Cognitively he is doing great.  He takes his medicines as prescribed. Depression Symptoms:  depressed mood, (Hypo) Manic Symptoms:   Anxiety Symptoms:  Excessive Worry, Psychotic Symptoms:   PTSD Symptoms:   Past Psychiatric History: 1 psychiatric hospitalization in Apr 26, 1988 presently on Lexapro  10 mg, BuSpar  30 mg  Previous Psychotropic Medications: Yes   Substance Abuse History in the last 12 months:  No.  Consequences of Substance Abuse:   Past Medical History:  Past Medical History:  Diagnosis Date   Allergy    mild   Anemia    Iron deficiency   Anxiety state, unspecified    Aortic atherosclerosis (HCC)    Arthritis     Calculus of kidney    kidney stones  - normal BMP    Cancer (HCC)    Prostate, skin cancer x 2    Cataract    forming both eyes   Depressive disorder, not elsewhere classified    Diverticulosis of colon (without mention of hemorrhage)    GERD (gastroesophageal reflux disease)    on nexium   Gout, unspecified    Heart murmur    Hyperlipidemia    Hypertension, essential    Irritable bowel syndrome    Neuromuscular disorder (HCC)    numbness in knees from back issues    Personal history of malignant neoplasm of prostate    PVC (premature ventricular contraction)    Type II or unspecified type diabetes mellitus without mention of complication, not stated as uncontrolled     Past Surgical History:  Procedure Laterality Date   COLONOSCOPY     HERNIA REPAIR     left side   POLYPECTOMY     PROSTATE SURGERY     skin cancer removed     chest x 2 3-4 yrs ago    TONSILLECTOMY      Family Psychiatric History:   Family History:  Family History  Problem Relation Age of Onset   Cancer Maternal Aunt  breast   Pancreatic cancer Paternal Aunt    Prostate cancer Paternal Uncle    Heart disease Maternal Grandmother 2   Heart disease Maternal Grandfather        fluid / CHF    Heart disease Paternal Grandfather 11   Heart attack Paternal Grandfather        passed at 26   Prostate cancer Father    Coronary artery disease Father 35       CABG   Hypertension Mother    Hyperlipidemia Mother    Other Daughter        accident with tornado 04/25/97   Hypertension Son    Colon cancer Neg Hx    Colon polyps Neg Hx    Esophageal cancer Neg Hx    Rectal cancer Neg Hx    Stomach cancer Neg Hx     Social History:   Social History   Socioeconomic History   Marital status: Married    Spouse name: Inocente   Number of children: 2   Years of education: 13   Highest education level: Some college, no degree  Occupational History   Occupation: Charity fundraiser     Comment: Part time  Tobacco Use   Smoking status: Former    Current packs/day: 0.00    Average packs/day: 1 pack/day for 19.0 years (19.0 ttl pk-yrs)    Types: Cigarettes    Start date: 10/13/1959    Quit date: 10/13/1978    Years since quitting: 45.4   Smokeless tobacco: Former    Types: Snuff    Quit date: 05/30/2008  Vaping Use   Vaping status: Never Used  Substance and Sexual Activity   Alcohol use: Not Currently   Drug use: No   Sexual activity: Yes  Other Topics Concern   Not on file  Social History Narrative   Lives at home with wife.  He has one son and a daughter that died in a tornado in 30. 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 He has one living grandson and one grandson that passed away in 04/25/2017 at age 63 from cancer.   Social Drivers of Corporate investment banker Strain: Low Risk  (10/20/2022)   Overall Financial Resource Strain (CARDIA)    Difficulty of Paying Living Expenses: Not hard at all  Food Insecurity: No Food Insecurity (08/12/2023)   Hunger Vital Sign    Worried About Running Out of Food in the Last Year: Never true    Ran Out of Food in the Last Year: Never true  Transportation Needs: No Transportation Needs (08/12/2023)   PRAPARE - Administrator, Civil Service (Medical): No    Lack of Transportation (Non-Medical): No  Physical Activity: Inactive (10/20/2022)   Exercise Vital Sign    Days of Exercise per Week: 0 days    Minutes of Exercise per Session: 0 min  Stress: No Stress Concern Present (10/20/2022)   [employer] of Occupational Health - Occupational Stress Questionnaire    Feeling of Stress : Not at all  Social Connections: Socially Integrated (08/09/2023)   Social Connection and Isolation Panel    Frequency of Communication with Friends and Family: More than three times a week    Frequency of Social Gatherings with Friends and Family: Once a week    Attends Religious Services: More than 4 times per year    Active  Member of [employer] or Organizations: Yes    Attends Banker  Meetings: 1 to 4 times per year    Marital Status: Married    Additional Social History:   Allergies:  No Known Allergies  Metabolic Disorder Labs: Lab Results  Component Value Date   HGBA1C 6.6 (H) 02/29/2024   No results found for: PROLACTIN Lab Results  Component Value Date   CHOL 113 02/29/2024   TRIG 66 02/29/2024   HDL 41 02/29/2024   CHOLHDL 2.8 02/29/2024   LDLCALC 58 02/29/2024   LDLCALC 73 05/30/2023   Lab Results  Component Value Date   TSH 1.330 12/05/2018    Therapeutic Level Labs: No results found for: LITHIUM No results found for: CBMZ No results found for: VALPROATE  Current Medications: Current Outpatient Medications  Medication Sig Dispense Refill   acetaminophen  (TYLENOL ) 325 MG tablet Take 2 tablets (650 mg total) by mouth 3 (three) times daily.     amLODipine  (NORVASC ) 10 MG tablet TAKE ONE (1) TABLET EACH DAY 90 tablet 3   atorvastatin  (LIPITOR) 80 MG tablet TAKE 1/2 TABLET DAILY AT 6PM 45 tablet 3   beta carotene 25000 UNIT capsule Take 25,000 Units by mouth daily.     Blood Glucose Calibration (OT ULTRA/FASTTK CNTRL SOLN) SOLN USE WITH METER AS DIRECTED DX E11.9 1 each 1   blood glucose meter kit and supplies Dispense based on patient and insurance preference. Use up to four times daily as directed. (FOR ICD-10 E10.9, E11.9). 1 each 1   Blood Glucose Monitoring Suppl DEVI 1 each by Does not apply route in the morning, at noon, and at bedtime. May substitute to any manufacturer covered by patient's insurance. 1 each 0   chlorthalidone  (HYGROTON ) 25 MG tablet Take 0.5 tablets (12.5 mg total) by mouth daily. 45 tablet 3   Cholecalciferol (VITAMIN D ) 2000 UNITS CAPS Take 1 capsule by mouth daily.     FeFum-FePo-FA-B Cmp-C-Zn-Mn-Cu (SE-TAN PLUS ) 162-115.2-1 MG CAPS TAKE ONE (1) CAPSULE BY MOUTH 2 TIMES DAILY 180 capsule 3   fish oil-omega-3 fatty acids 1000 MG capsule Take 1  g by mouth 2 (two) times daily.      ibuprofen  (ADVIL ) 600 MG tablet Take 600 mg by mouth every 6 (six) hours as needed.     losartan  (COZAAR ) 100 MG tablet Take 1 tablet (100 mg total) by mouth daily. 90 tablet 3   metFORMIN  (GLUCOPHAGE ) 500 MG tablet Take 2 tablets (1,000 mg total) by mouth 2 (two) times daily with a meal. 360 tablet 3   Multiple Vitamins-Minerals (CENTRUM SILVER PO) Take 1 tablet by mouth daily.      omeprazole  (PRILOSEC) 20 MG capsule TAKE ONE CAPSULE BY MOUTH DAILY 90 capsule 0   busPIRone  (BUSPAR ) 15 MG tablet 2 qam   2  qhs 120 tablet 5   LORazepam  (ATIVAN ) 0.5 MG tablet 2  bid 120 tablet 4   mirtazapine  (REMERON  SOL-TAB) 15 MG disintegrating tablet Take one 15 mg tablet each morning. 30 tablet 5   mirtazapine  (REMERON ) 30 MG tablet 1  qhs 30 tablet 5   venlafaxine  XR (EFFEXOR  XR) 150 MG 24 hr capsule Take 2 capsules (300 mg total) with breakfast. 60 capsule 5   No current facility-administered medications for this visit.    Musculoskeletal: Strength & Muscle Tone: within normal limits Gait & Station: normal Patient leans: N/A  Psychiatric Specialty Exam: Review of Systems  Blood pressure (!) 147/72, pulse 96, height 5' 3 (1.6 m), weight 171 lb (77.6 kg).Body mass index is 30.29 kg/m.  General Appearance: Casual  Eye Contact:  Good  Speech:  Clear and Coherent  Volume:  Normal  Mood:  Depressed  Affect:  Appropriate  Thought Process:  Coherent  Orientation:  Full (Time, Place, and Person)  Thought Content:  WDL  Suicidal Thoughts:  No  Homicidal Thoughts:  No  Memory:  Negative  Judgement:  Good  Insight:  Good  Psychomotor Activity:  Normal  Concentration:    Recall:  Good  Fund of Knowledge:Fair  Language: Good  Akathisia:  No  Handed:  Right  AIMS (if indicated):  not done  Assets:  Desire for Improvement  ADL's:  Intact  Cognition: WNL  Sleep:  Good   Screenings: GAD-7    Flowsheet Row Office Visit from 02/29/2024 in [facility] Office Visit from 11/28/2023 in [facility] Office Visit from 08/31/2023 in [facility] Office Visit from 08/08/2023 in [facility] Office Visit from 05/30/2023 in [facility]  Total GAD-7 Score 7 5 7 7 10    Mini-Mental    Flowsheet Row Clinical Support from 05/16/2017 in [facility]  Total Score (max 30 points ) 29   PHQ2-9    Flowsheet Row Office Visit from 02/29/2024 in [facility] Office Visit from 11/28/2023 in [facility] Office Visit from 08/31/2023 in [facility] Office Visit from 08/18/2023 in [facility] Office Visit from 08/08/2023 in [facility]  PHQ-2 Total Score 2 1 2 2 2   PHQ-9 Total Score 4 3 7  -- 6   Flowsheet Row ED to Hosp-Admission (Discharged) from 08/08/2023 in [facility] ED from 08/06/2023 in [facility] Department [facility]  C-SSRS RISK CATEGORY No Risk No Risk    Assessment and Plan:  9/9/20252:59 PM    This patient's diagnosis is major depression in remission.  He takes Effexor  300 mg in the morning.  He takes Remeron  15 mg in the morning and 30 mg at night.  His second problem is an adjustment disorder with an anxious mood state.  He takes Ativan  0.5 mg 2 in the morning and 2 at night.  He takes BuSpar  30 mg twice daily.  And this combination of medicines which he has been on for over a year he is done very well.  He will return to see me in 5 months.  I would say today he is the best he has ever been since I do not.  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

## 2024-04-10 ENCOUNTER — Ambulatory Visit (HOSPITAL_COMMUNITY): Admitting: Psychiatry

## 2024-04-30 DIAGNOSIS — H35363 Drusen (degenerative) of macula, bilateral: Secondary | ICD-10-CM | POA: Diagnosis not present

## 2024-04-30 DIAGNOSIS — H43813 Vitreous degeneration, bilateral: Secondary | ICD-10-CM | POA: Diagnosis not present

## 2024-04-30 DIAGNOSIS — E119 Type 2 diabetes mellitus without complications: Secondary | ICD-10-CM | POA: Diagnosis not present

## 2024-04-30 DIAGNOSIS — H25813 Combined forms of age-related cataract, bilateral: Secondary | ICD-10-CM | POA: Diagnosis not present

## 2024-04-30 LAB — OPHTHALMOLOGY REPORT-SCANNED

## 2024-05-31 ENCOUNTER — Ambulatory Visit: Payer: Self-pay | Admitting: Family Medicine

## 2024-05-31 ENCOUNTER — Encounter: Payer: Self-pay | Admitting: Family Medicine

## 2024-05-31 VITALS — BP 140/73 | HR 97 | Ht 63.0 in | Wt 166.0 lb

## 2024-05-31 DIAGNOSIS — I10 Essential (primary) hypertension: Secondary | ICD-10-CM

## 2024-05-31 DIAGNOSIS — E1169 Type 2 diabetes mellitus with other specified complication: Secondary | ICD-10-CM

## 2024-05-31 DIAGNOSIS — E782 Mixed hyperlipidemia: Secondary | ICD-10-CM

## 2024-05-31 DIAGNOSIS — E1159 Type 2 diabetes mellitus with other circulatory complications: Secondary | ICD-10-CM | POA: Diagnosis not present

## 2024-05-31 DIAGNOSIS — Z23 Encounter for immunization: Secondary | ICD-10-CM

## 2024-05-31 DIAGNOSIS — E785 Hyperlipidemia, unspecified: Secondary | ICD-10-CM

## 2024-05-31 DIAGNOSIS — Z7984 Long term (current) use of oral hypoglycemic drugs: Secondary | ICD-10-CM

## 2024-05-31 DIAGNOSIS — E1165 Type 2 diabetes mellitus with hyperglycemia: Secondary | ICD-10-CM

## 2024-05-31 DIAGNOSIS — I152 Hypertension secondary to endocrine disorders: Secondary | ICD-10-CM

## 2024-05-31 LAB — BAYER DCA HB A1C WAIVED: HB A1C (BAYER DCA - WAIVED): 7.1 % — ABNORMAL HIGH (ref 4.8–5.6)

## 2024-05-31 MED ORDER — OMEPRAZOLE 20 MG PO CPDR: 20.0000 mg | DELAYED_RELEASE_CAPSULE | Freq: Every day | ORAL | 3 refills | Status: AC

## 2024-05-31 NOTE — Progress Notes (Signed)
 BP (!) 140/73   Pulse 97   Ht 5' 3 (1.6 m)   Wt 166 lb (75.3 kg)   SpO2 95%   BMI 29.41 kg/m    Subjective:   Patient ID: George Wang, male    DOB: 03/20/1946, 78 y.o.   MRN: [id number]  HPI: George Wang is a 78 y.o. male presenting on 05/31/2024 for Medical Management of Chronic Issues, Hyperlipidemia, Hypertension, Diabetes, and Anxiety   Discussed the use of AI scribe software for clinical note transcription with the patient, who gave verbal consent to proceed.  History of Present Illness   The patient presents for a recheck of diabetes and hypertension management.  Hypertension - Blood pressure readings mostly in the 140s/70s, with occasional readings in the 150s systolic - Currently taking amlodipine , chlorthalidone , and losartan  for blood pressure management  Hyperglycemia - Blood sugar levels mostly in the 90s to low 110s, with occasional readings in the 120s - Currently taking metformin  twice daily for glycemic control  Anxiety symptoms - Anxiety occurs particularly around medical visits or when tasks need to be completed - Feels 'real bad' in the mornings until medications are taken - Currently taking venlafaxine  (Effexor ), lorazepam , buspirone , and mirtazapine  for anxiety management - Medication regimen: two venlafaxine  pills, two lorazepam  pills in the morning and evening, two buspirone  pills in the morning and evening, mirtazapine  5 mg in the morning and 30 mg at night  Gastroesophageal reflux symptoms - Occasional discomfort after certain foods - Currently taking omeprazole  for reflux - Also taking fish oil and vitamin D3          Relevant past medical, surgical, family and social history reviewed and updated as indicated. Interim medical history since our last visit reviewed. Allergies and medications reviewed and updated.  Review of Systems  Constitutional:  Negative for chills and fever.  Eyes:  Negative for visual disturbance.   Respiratory:  Negative for shortness of breath and wheezing.   Cardiovascular:  Negative for chest pain and leg swelling.  Skin:  Negative for rash.  Psychiatric/Behavioral:  Positive for dysphoric mood. Negative for self-injury, sleep disturbance and suicidal ideas. The patient is nervous/anxious.   All other systems reviewed and are negative.   Per HPI unless specifically indicated above   Allergies as of 05/31/2024   No Known Allergies      Medication List        Accurate as of May 31, 2024 11:10 AM. If you have any questions, ask your nurse or doctor.          acetaminophen  325 MG tablet Commonly known as: TYLENOL  Take 2 tablets (650 mg total) by mouth 3 (three) times daily.   amLODipine  10 MG tablet Commonly known as: NORVASC  TAKE ONE (1) TABLET EACH DAY   atorvastatin  80 MG tablet Commonly known as: LIPITOR TAKE 1/2 TABLET DAILY AT 6PM   beta carotene 25000 UNIT capsule Take 25,000 Units by mouth daily.   blood glucose meter kit and supplies Dispense based on patient and insurance preference. Use up to four times daily as directed. (FOR ICD-10 E10.9, E11.9).   Blood Glucose Monitoring Suppl Devi 1 each by Does not apply route in the morning, at noon, and at bedtime. May substitute to any manufacturer covered by patient's insurance.   busPIRone  15 MG tablet Commonly known as: BUSPAR  2 qam   2  qhs   CENTRUM SILVER PO Take 1 tablet by mouth daily.   chlorthalidone  25 MG tablet Commonly  known as: HYGROTON  Take 0.5 tablets (12.5 mg total) by mouth daily.   fish oil-omega-3 fatty acids 1000 MG capsule Take 1 g by mouth 2 (two) times daily.   ibuprofen  600 MG tablet Commonly known as: ADVIL  Take 600 mg by mouth every 6 (six) hours as needed.   LORazepam  0.5 MG tablet Commonly known as: Ativan  2  bid   losartan  100 MG tablet Commonly known as: COZAAR  Take 1 tablet (100 mg total) by mouth daily.   metFORMIN  500 MG tablet Commonly known as:  GLUCOPHAGE  Take 2 tablets (1,000 mg total) by mouth 2 (two) times daily with a meal.   mirtazapine  30 MG tablet Commonly known as: Remeron  1  qhs   mirtazapine  15 MG disintegrating tablet Commonly known as: REMERON  SOL-TAB Take one 15 mg tablet each morning.   omeprazole  20 MG capsule Commonly known as: PRILOSEC Take 1 capsule (20 mg total) by mouth daily.   OT ULTRA/FASTTK CNTRL SOLN Soln USE WITH METER AS DIRECTED DX E11.9   Se-Tan PLUS  162-115.2-1 MG Caps TAKE ONE (1) CAPSULE BY MOUTH 2 TIMES DAILY   venlafaxine  XR 150 MG 24 hr capsule Commonly known as: Effexor  XR Take 2 capsules (300 mg total) with breakfast.   Vitamin D  50 MCG (2000 UT) Caps Take 1 capsule by mouth daily.         Objective:   BP (!) 140/73   Pulse 97   Ht 5' 3 (1.6 m)   Wt 166 lb (75.3 kg)   SpO2 95%   BMI 29.41 kg/m   Wt Readings from Last 3 Encounters:  05/31/24 166 lb (75.3 kg)  02/29/24 172 lb (78 kg)  11/28/23 173 lb 12.8 oz (78.8 kg)    Physical Exam Physical Exam   VITALS: BP- 140/73 NECK: Thyroid  normal, no nodules. CHEST: Lungs clear to auscultation. CARDIOVASCULAR: Regular heart rate and rhythm, no murmurs. EXTREMITIES: No edema, good peripheral pulses.       Results for orders placed or performed in visit on 04/30/24  OPHTHALMOLOGY REPORT-SCANNED   Collection Time: 04/30/24 12:00 PM  Result Value Ref Range   HM Diabetic Eye Exam No Retinopathy No Retinopathy   A Comment      Assessment & Plan:   Problem List Items Addressed This Visit       Cardiovascular and Mediastinum   Hypertension associated with diabetes (HCC)   Relevant Medications   omeprazole  (PRILOSEC) 20 MG capsule   Other Relevant Orders   CBC with Differential/Platelet   CMP14+EGFR   Lipid panel   TSH   Bayer DCA Hb A1c Waived   Vitamin B12     Endocrine   Hyperlipidemia associated with type 2 diabetes mellitus (HCC) - Primary   Relevant Medications   omeprazole  (PRILOSEC) 20 MG capsule    Other Relevant Orders   CBC with Differential/Platelet   CMP14+EGFR   Lipid panel   TSH   Bayer DCA Hb A1c Waived   Vitamin B12   Type 2 diabetes mellitus (HCC)   Other Visit Diagnoses       Essential hypertension       Relevant Medications   omeprazole  (PRILOSEC) 20 MG capsule     Mixed hyperlipidemia       Relevant Medications   omeprazole  (PRILOSEC) 20 MG capsule          Type 2 diabetes mellitus Blood glucose levels well-controlled. - Continue metformin  twice daily.  Hypertension Blood pressure mostly in 140s/70s, occasional 150s, decreases after  sitting. - Continue amlodipine , chlorthalidone , and losartan . - Monitor blood pressure regularly, especially after sitting.  Gastroesophageal reflux disease Symptoms controlled with omeprazole , occasional burning after certain foods. - Continue omeprazole .  Anxiety disorder Increased morning anxiety, current medications include Effexor , lorazepam , buspirone , mirtazapine . Lorazepam  provides quick relief but wears off quickly. - Discuss with psychiatrist about increasing buspirone  or venlafaxine  dosage.          Follow up plan: Return in about 3 months (around 08/31/2024), or if symptoms worsen or fail to improve, for Diabetes .  Counseling provided for all of the vaccine components Orders Placed This Encounter  Procedures   CBC with Differential/Platelet   CMP14+EGFR   Lipid panel   TSH   Bayer DCA Hb A1c Waived   Vitamin B12    Fonda Levins, MD [facility] 05/31/2024, 11:10 AM

## 2024-06-01 LAB — CBC WITH DIFFERENTIAL/PLATELET
Basophils Absolute: 0.1 x10E3/uL (ref 0.0–0.2)
Basos: 1 %
EOS (ABSOLUTE): 0.2 x10E3/uL (ref 0.0–0.4)
Eos: 2 %
Hematocrit: 37.5 % (ref 37.5–51.0)
Hemoglobin: 12.5 g/dL — ABNORMAL LOW (ref 13.0–17.7)
Immature Grans (Abs): 0 x10E3/uL (ref 0.0–0.1)
Immature Granulocytes: 0 %
Lymphocytes Absolute: 1.7 x10E3/uL (ref 0.7–3.1)
Lymphs: 23 %
MCH: 32.6 pg (ref 26.6–33.0)
MCHC: 33.3 g/dL (ref 31.5–35.7)
MCV: 98 fL — ABNORMAL HIGH (ref 79–97)
Monocytes Absolute: 0.7 x10E3/uL (ref 0.1–0.9)
Monocytes: 9 %
Neutrophils Absolute: 4.9 x10E3/uL (ref 1.4–7.0)
Neutrophils: 65 %
Platelets: 300 x10E3/uL (ref 150–450)
RBC: 3.83 x10E6/uL — ABNORMAL LOW (ref 4.14–5.80)
RDW: 11.9 % (ref 11.6–15.4)
WBC: 7.5 x10E3/uL (ref 3.4–10.8)

## 2024-06-01 LAB — LIPID PANEL
Chol/HDL Ratio: 2.7 ratio (ref 0.0–5.0)
Cholesterol, Total: 123 mg/dL (ref 100–199)
HDL: 45 mg/dL (ref >39)
LDL Chol Calc (NIH): 61 mg/dL (ref 0–99)
Triglycerides: 86 mg/dL (ref 0–149)
VLDL Cholesterol Cal: 17 mg/dL (ref 5–40)

## 2024-06-01 LAB — CMP14+EGFR
ALT: 20 IU/L (ref 0–44)
AST: 60 IU/L — ABNORMAL HIGH (ref 0–40)
Albumin: 4.6 g/dL (ref 3.8–4.8)
Alkaline Phosphatase: 81 IU/L (ref 47–123)
BUN/Creatinine Ratio: 21 (ref 10–24)
BUN: 22 mg/dL (ref 8–27)
Bilirubin Total: 0.5 mg/dL (ref 0.0–1.2)
CO2: 27 mmol/L (ref 20–29)
Calcium: 10.3 mg/dL — ABNORMAL HIGH (ref 8.6–10.2)
Chloride: 98 mmol/L (ref 96–106)
Creatinine, Ser: 1.05 mg/dL (ref 0.76–1.27)
Globulin, Total: 1.8 g/dL (ref 1.5–4.5)
Glucose: 175 mg/dL — ABNORMAL HIGH (ref 70–99)
Potassium: 4.8 mmol/L (ref 3.5–5.2)
Sodium: 140 mmol/L (ref 134–144)
Total Protein: 6.4 g/dL (ref 6.0–8.5)
eGFR: 72 mL/min/1.73 (ref >59)

## 2024-06-01 LAB — VITAMIN B12: Vitamin B-12: 488 pg/mL (ref 232–1245)

## 2024-06-01 LAB — TSH: TSH: 1.14 u[IU]/mL (ref 0.450–4.500)

## 2024-06-07 ENCOUNTER — Ambulatory Visit: Payer: Self-pay | Admitting: Family Medicine

## 2024-06-07 NOTE — Telephone Encounter (Signed)
 Called patient, no answer, left message to return call

## 2024-06-09 ENCOUNTER — Other Ambulatory Visit: Payer: Self-pay | Admitting: Family Medicine

## 2024-08-24 ENCOUNTER — Other Ambulatory Visit: Payer: Self-pay | Admitting: Family Medicine

## 2024-08-24 DIAGNOSIS — I10 Essential (primary) hypertension: Secondary | ICD-10-CM

## 2024-08-24 DIAGNOSIS — I152 Hypertension secondary to endocrine disorders: Secondary | ICD-10-CM

## 2024-08-24 DIAGNOSIS — E782 Mixed hyperlipidemia: Secondary | ICD-10-CM

## 2024-08-28 ENCOUNTER — Ambulatory Visit (HOSPITAL_COMMUNITY): Admitting: Psychiatry

## 2024-08-29 ENCOUNTER — Ambulatory Visit (HOSPITAL_COMMUNITY): Admitting: Psychiatry

## 2024-09-03 ENCOUNTER — Ambulatory Visit: Admitting: Family Medicine
